# Patient Record
Sex: Male | Born: 1950
Health system: Southern US, Community
[De-identification: ages and names within clinical notes are randomized; demographics above are authoritative.]

## PROBLEM LIST (undated history)

## (undated) DIAGNOSIS — E119 Type 2 diabetes mellitus without complications: Secondary | ICD-10-CM

## (undated) DIAGNOSIS — I10 Essential (primary) hypertension: Secondary | ICD-10-CM

## (undated) DIAGNOSIS — I714 Abdominal aortic aneurysm, without rupture, unspecified: Secondary | ICD-10-CM

## (undated) DIAGNOSIS — I2510 Atherosclerotic heart disease of native coronary artery without angina pectoris: Secondary | ICD-10-CM

## (undated) DIAGNOSIS — I719 Aortic aneurysm of unspecified site, without rupture: Secondary | ICD-10-CM

## (undated) DIAGNOSIS — F419 Anxiety disorder, unspecified: Secondary | ICD-10-CM

## (undated) DIAGNOSIS — C61 Malignant neoplasm of prostate: Secondary | ICD-10-CM

## (undated) DIAGNOSIS — C3490 Malignant neoplasm of unspecified part of unspecified bronchus or lung: Secondary | ICD-10-CM

## (undated) DIAGNOSIS — J449 Chronic obstructive pulmonary disease, unspecified: Secondary | ICD-10-CM

## (undated) DIAGNOSIS — I639 Cerebral infarction, unspecified: Secondary | ICD-10-CM

## (undated) HISTORY — DX: Essential (primary) hypertension: I10

## (undated) HISTORY — DX: Chronic obstructive pulmonary disease, unspecified: J44.9

## (undated) HISTORY — DX: Abdominal aortic aneurysm, without rupture: I71.4

## (undated) HISTORY — DX: Abdominal aortic aneurysm, without rupture, unspecified: I71.40

## (undated) HISTORY — DX: Atherosclerotic heart disease of native coronary artery without angina pectoris: I25.10

## (undated) HISTORY — PX: SINUS SURGERY WITH INSTATRAK: SHX5215

## (undated) HISTORY — DX: Malignant neoplasm of unspecified part of unspecified bronchus or lung: C34.90

## (undated) HISTORY — DX: Type 2 diabetes mellitus without complications: E11.9

## (undated) HISTORY — PX: FRACTURE SURGERY: SHX138

## (undated) HISTORY — PX: PROSTATE BIOPSY: SHX241

---

## 2006-10-12 ENCOUNTER — Ambulatory Visit (HOSPITAL_COMMUNITY): Admission: RE | Admit: 2006-10-12 | Discharge: 2006-10-12 | Payer: Self-pay | Admitting: Family Medicine

## 2008-03-06 ENCOUNTER — Ambulatory Visit (HOSPITAL_COMMUNITY): Admission: RE | Admit: 2008-03-06 | Discharge: 2008-03-06 | Payer: Self-pay | Admitting: Family Medicine

## 2008-03-19 ENCOUNTER — Ambulatory Visit (HOSPITAL_COMMUNITY): Admission: RE | Admit: 2008-03-19 | Discharge: 2008-03-19 | Payer: Self-pay | Admitting: Family Medicine

## 2008-04-08 ENCOUNTER — Ambulatory Visit (HOSPITAL_COMMUNITY): Admission: RE | Admit: 2008-04-08 | Discharge: 2008-04-08 | Payer: Self-pay | Admitting: Family Medicine

## 2008-05-14 ENCOUNTER — Ambulatory Visit (HOSPITAL_COMMUNITY): Admission: RE | Admit: 2008-05-14 | Discharge: 2008-05-14 | Payer: Self-pay | Admitting: Urology

## 2009-09-29 ENCOUNTER — Encounter (INDEPENDENT_AMBULATORY_CARE_PROVIDER_SITE_OTHER): Payer: Self-pay | Admitting: *Deleted

## 2011-05-02 NOTE — Procedures (Signed)
NAME:  Stephen Palmer, Stephen Palmer NO.:  [id number]   MEDICAL RECORD NO.:  [id number]          PATIENT TYPE:  OUT   LOCATION:  RESP                          FACILITY:  [facility]   PHYSICIAN:  Edward L. Juanetta Gosling, M.D.DATE OF BIRTH:  1951-01-25   DATE OF PROCEDURE:  03/19/2008  DATE OF DISCHARGE:                            PULMONARY FUNCTION TEST   1. Spirometry shows no ventilatory defect but evidence of airflow      obstruction.  2. Lung volumes are normal.  3. DLCO is normal.  4. Arterial blood gases are normal.  5. There is no significant bronchodilator response.  6. This is consistent with a clinical diagnosis of COPD which is mild.      Edward L. Juanetta Gosling, M.D.  Electronically Signed     [facility]/MEDQ  D:  03/20/2008  T:  03/20/2008  Job:  [id number]   cc:   Kirk Ruths, M.D.  Fax: [phone]

## 2011-09-12 LAB — BLOOD GAS, ARTERIAL
FIO2: 0.21
O2 Saturation: 97.1
Patient temperature: 37
pH, Arterial: 7.41

## 2012-05-13 ENCOUNTER — Emergency Department (HOSPITAL_COMMUNITY): Payer: Self-pay

## 2012-05-13 ENCOUNTER — Emergency Department (HOSPITAL_COMMUNITY)
Admission: EM | Admit: 2012-05-13 | Discharge: 2012-05-13 | Disposition: A | Discharge status: Home / Self Care | Payer: Self-pay | Attending: Emergency Medicine | Admitting: Emergency Medicine

## 2012-05-13 ENCOUNTER — Encounter (HOSPITAL_COMMUNITY): Payer: Self-pay | Admitting: *Deleted

## 2012-05-13 DIAGNOSIS — R0602 Shortness of breath: Secondary | ICD-10-CM | POA: Insufficient documentation

## 2012-05-13 DIAGNOSIS — R509 Fever, unspecified: Secondary | ICD-10-CM | POA: Insufficient documentation

## 2012-05-13 DIAGNOSIS — R079 Chest pain, unspecified: Secondary | ICD-10-CM | POA: Insufficient documentation

## 2012-05-13 DIAGNOSIS — R059 Cough, unspecified: Secondary | ICD-10-CM | POA: Insufficient documentation

## 2012-05-13 DIAGNOSIS — R112 Nausea with vomiting, unspecified: Secondary | ICD-10-CM | POA: Insufficient documentation

## 2012-05-13 DIAGNOSIS — R05 Cough: Secondary | ICD-10-CM | POA: Insufficient documentation

## 2012-05-13 DIAGNOSIS — F17200 Nicotine dependence, unspecified, uncomplicated: Secondary | ICD-10-CM | POA: Insufficient documentation

## 2012-05-13 DIAGNOSIS — J189 Pneumonia, unspecified organism: Secondary | ICD-10-CM | POA: Insufficient documentation

## 2012-05-13 HISTORY — DX: Anxiety disorder, unspecified: F41.9

## 2012-05-13 LAB — BASIC METABOLIC PANEL
BUN: 24 mg/dL — ABNORMAL HIGH (ref 6–23)
Calcium: 9.7 mg/dL (ref 8.4–10.5)
GFR calc Af Amer: >90 mL/min (ref >90)
GFR calc non Af Amer: 78 mL/min — ABNORMAL LOW (ref >90)
Glucose, Bld: 165 mg/dL — ABNORMAL HIGH (ref 70–99)
Potassium: 3.2 mEq/L — ABNORMAL LOW (ref 3.5–5.1)
Sodium: 131 mEq/L — ABNORMAL LOW (ref 135–145)

## 2012-05-13 LAB — DIFFERENTIAL
Basophils Absolute: 0 10*3/uL (ref 0.0–0.1)
Basophils Relative: 0 % (ref 0–1)
Eosinophils Absolute: 0 10*3/uL (ref 0.0–0.7)
Lymphocytes Relative: 5 % — ABNORMAL LOW (ref 12–46)
Monocytes Absolute: 2.1 10*3/uL — ABNORMAL HIGH (ref 0.1–1.0)
Neutrophils Relative %: 87 % — ABNORMAL HIGH (ref 43–77)

## 2012-05-13 LAB — CBC
Hemoglobin: 12.6 g/dL — ABNORMAL LOW (ref 13.0–17.0)
MCH: 30.6 pg (ref 26.0–34.0)
MCHC: 34.1 g/dL (ref 30.0–36.0)
RDW: 13.6 % (ref 11.5–15.5)

## 2012-05-13 MED ORDER — AZITHROMYCIN 250 MG PO TABS: 250.0000 mg | ORAL_TABLET | Freq: Every day | ORAL | Status: AC

## 2012-05-13 MED ORDER — DEXTROSE 5 % IV SOLN
500.0000 mg | Freq: Once | INTRAVENOUS | Status: AC
  Administered 2012-05-13: 500 mg via INTRAVENOUS
  Filled 2012-05-13: qty 500

## 2012-05-13 MED ORDER — SODIUM CHLORIDE 0.9 % IV BOLUS (SEPSIS)
1000.0000 mL | Freq: Once | INTRAVENOUS | Status: AC
  Administered 2012-05-13: 1000 mL via INTRAVENOUS

## 2012-05-13 MED ORDER — CEFTRIAXONE SODIUM 1 G IJ SOLR
1.0000 g | Freq: Once | INTRAMUSCULAR | Status: AC
  Administered 2012-05-13: 1 g via INTRAVENOUS
  Filled 2012-05-13: qty 10

## 2012-05-13 MED ORDER — IBUPROFEN 800 MG PO TABS
ORAL_TABLET | ORAL | Status: AC
  Administered 2012-05-13: 800 mg
  Filled 2012-05-13: qty 1

## 2012-05-13 NOTE — ED Notes (Signed)
Family at bedside. 

## 2012-05-13 NOTE — ED Provider Notes (Signed)
History   This chart was scribed for No att. providers found by Toya Smothers. The patient was seen in room APA12/APA12. Patient's care was started at 1823.  CSN: [id number]  Arrival date & time 05/13/12  4098   First MD Initiated Contact with Patient 05/13/12 1944      Chief Complaint  Patient presents with  . Fever   The history is provided by the patient. No language interpreter was used.    Stephen Palmer is a 61 y.o. male who presents to the Emergency Department complaining of gradual onset moderate severe constant fever onset one and a half weeks ago with associated bloody sputum initially, cough w/ green and brown phlegm, emesis, severe chills, mild SOB, left side chest pain, and nausea, denying sneezing, HA, and diarrhea.Pt reports that he recently recorded fevers at 102 and 101, and that he is currently experiencing symptoms similar to that of previous episode of pneumonia many years ago. Patient states he had chills today and he sometimes feels short of breath.   PCP  Dr. Regino Schultze  Past Medical History  Diagnosis Date  . Anxiety   degenerative joint disease   Past Surgical History  Procedure Date  . Fracture surgery    History reviewed. No pertinent family history.  History  Substance Use Topics  . Smoking status: Current Everyday Smoker  . Smokeless tobacco: Not on file  . Alcohol Use: No  employed Lives at home with spouse  Review of Systems  Constitutional: Positive for fever and chills.  HENT: Negative for rhinorrhea and neck pain.   Eyes: Negative for pain.  Respiratory: Positive for cough and shortness of breath.   Cardiovascular: Positive for chest pain.  Gastrointestinal: Positive for nausea and vomiting (mild). Negative for abdominal pain and diarrhea.  Genitourinary: Negative for dysuria.  Musculoskeletal: Negative for back pain.  Skin: Negative for rash.  Neurological: Negative for dizziness and weakness.  All other systems reviewed and are  negative.    Allergies  Sulfa antibiotics  Home Medications   Current Outpatient Rx  Name Route Sig Dispense Refill  . ALPRAZOLAM 1 MG PO TABS Oral Take 0.5 mg by mouth 2 (two) times daily.    . CYCLOBENZAPRINE HCL 10 MG PO TABS Oral Take 10 mg by mouth 3 (three) times daily as needed.    Marland Kitchen HYDROCODONE-ACETAMINOPHEN 7.5-650 MG PO TABS Oral Take 0.5-1 tablets by mouth every 6 (six) hours as needed. For pain     BP 140/77  Pulse 129  Temp(Src) 9103F (37.2 C) (Oral)  Resp 18  Ht 5\' 10"  (1.778 m)  Wt 162 lb (73.483 kg)  BMI 23.24 kg/m2  SpO2 93%  Vital signs normal except fever with tachycardia  Physical Exam  Nursing note and vitals reviewed. Constitutional: He is oriented to person, place, and time. He appears well-developed and well-nourished.  Non-toxic appearance. He does not appear ill. No distress.  HENT:  Head: Normocephalic and atraumatic.  Right Ear: External ear normal.  Left Ear: External ear normal.  Nose: Nose normal. No mucosal edema or rhinorrhea.  Mouth/Throat: Mucous membranes are normal. No dental abscesses or uvula swelling.       Dry tongue.  Eyes: Conjunctivae and EOM are normal. Pupils are equal, round, and reactive to light.  Neck: Normal range of motion and full passive range of motion without pain. Neck supple. No tracheal deviation present.  Cardiovascular: Normal rate, regular rhythm and normal heart sounds.  Exam reveals no gallop and no friction  rub.   No murmur heard. Pulmonary/Chest: Effort normal. No respiratory distress. He has no wheezes. He has no rhonchi. Rales: L base. He exhibits no tenderness and no crepitus.  Abdominal: Soft. Normal appearance and bowel sounds are normal. He exhibits no distension. There is no tenderness. There is no rebound and no guarding.  Musculoskeletal: Normal range of motion. He exhibits no edema and no tenderness.       Moves all extremities well.   Neurological: He is alert and oriented to person, place, and  time. He has normal strength. No cranial nerve deficit or sensory deficit.  Skin: Skin is warm, dry and intact. No rash noted. No erythema. No pallor.  Psychiatric: He has a normal mood and affect. His speech is normal and behavior is normal. His mood appears not anxious.    ED Course  Procedures (including critical care time)   Medications  cefTRIAXone (ROCEPHIN) 1 g in dextrose 5 % 50 mL IVPB (1 g Intravenous Given 05/13/12 2035)  azithromycin (ZITHROMAX) 500 mg in dextrose 5 % 250 mL IVPB (0 mg Intravenous Stopped 05/13/12 2236)  sodium chloride 0.9 % bolus 1,000 mL (1000 mL Intravenous Given 05/13/12 2034)     DIAGNOSTIC STUDIES: Oxygen Saturation is 95% on room air, adequate by my interpretation.    COORDINATION OF CARE: 8:23PM- Evaluated state of Pt's present illness and prescribed antibiotics 9:23PM-Checked on Pt. Pt is feeling better. Discussed discharge.patient wants to be discharged, he does not want to be admitted to the hospital. Patient had pulse ox done while ambulating and his lowest pulse ox was 92% on room air.  10:33PM- Rechecked Pt. Pt is feeling better and prepared for discharge. Discussed discharge procedure and reviewed x-ray.patient again states he wants to be discharged instead of admitted.    Dg Chest 2 View  05/13/2012  *RADIOLOGY REPORT*  Clinical Data: Fever for 1 week.  CHEST - 2 VIEW  Comparison: 03/06/2008  Findings: Left lower lobe pneumonia is present with dense retrocardiac consolidation.  No effusion is present.  There is no pneumothorax.  Right lung is clear.  Moderate hyperinflation consistent with COPD. Normal mediastinal contours and pulmonary vascularity.  Cardiac size upper limits normal.  IMPRESSION: Left lower lobe pneumonia.  Original Report Authenticated By: Elsie Stain, M.D.   Results for orders placed during the hospital encounter of 05/13/12  CBC      Component Value Range   WBC 25.7 (*) 4.0 - 10.5 (K/uL)   RBC 4.12 (*) 4.22 - 5.81  (MIL/uL)   Hemoglobin 12.6 (*) 13.0 - 17.0 (g/dL)   HCT 40.9 (*) 81.1 - 52.0 (%)   MCV 89.8  78.0 - 100.0 (fL)   MCH 30.6  26.0 - 34.0 (pg)   MCHC 34.1  30.0 - 36.0 (g/dL)   RDW 91.4  78.2 - 95.6 (%)   Platelets 245  150 - 400 (K/uL)  DIFFERENTIAL      Component Value Range   Neutrophils Relative 87 (*) 43 - 77 (%)   Lymphocytes Relative 5 (*) 12 - 46 (%)   Monocytes Relative 8  3 - 12 (%)   Eosinophils Relative 0  0 - 5 (%)   Basophils Relative 0  0 - 1 (%)   Neutro Abs 22.3 (*) 1.7 - 7.7 (K/uL)   Lymphs Abs 1.3  0.7 - 4.0 (K/uL)   Monocytes Absolute 2.1 (*) 0.1 - 1.0 (K/uL)   Eosinophils Absolute 0.0  0.0 - 0.7 (K/uL)   Basophils Absolute  0.0  0.0 - 0.1 (K/uL)   WBC Morphology WHITE COUNT CONFIRMED ON SMEAR    BASIC METABOLIC PANEL      Component Value Range   Sodium 131 (*) 135 - 145 (mEq/L)   Potassium 3.2 (*) 3.5 - 5.1 (mEq/L)   Chloride 95 (*) 96 - 112 (mEq/L)   CO2 25  19 - 32 (mEq/L)   Glucose, Bld 165 (*) 70 - 99 (mg/dL)   BUN 24 (*) 6 - 23 (mg/dL)   Creatinine, Ser 2.53  0.50 - 1.35 (mg/dL)   Calcium 9.7  8.4 - 66.4 (mg/dL)   GFR calc non Af Amer 78 (*) >90 (mL/min)   GFR calc Af Amer >90  >90 (mL/min)   Laboratory interpretation all normal except I'll hyponatremia and hypokalemia, mild hyper glycemia,, leukocytosis    1. Pneumonia, community acquired    New Prescriptions   AZITHROMYCIN (ZITHROMAX) 250 MG TABLET    Take 1 tablet (250 mg total) by mouth daily.   Plan discharge  Devoria Albe, MD, FACEP   MDM   I personally performed the services described in this documentation, which was scribed in my presence. The recorded information has been reviewed and considered.  Devoria Albe, MD, FACEP        Ward Givens, MD 05/14/12 0040

## 2012-05-13 NOTE — ED Notes (Signed)
Patient is resting comfortably. 

## 2012-05-13 NOTE — ED Notes (Signed)
Ambulated pt around nurses station; lowest O2 sat was at 92%.

## 2012-05-13 NOTE — ED Notes (Signed)
Fever, body aches , nausea.  No rash

## 2012-05-13 NOTE — Discharge Instructions (Signed)
Drink plenty of fluids. Take ibuprofen 600 mg + acetaminophen 1000 mg every 6 hrs for fever. Use mucinex OTC for your cough. Take the zithromax until gone. Have Dr Regino Schultze recheck you in about 2-3 days. Return to the ED if you struggle to breathe or feel worse.   Pneumonia, Adult Pneumonia is an infection of the lungs. It may be caused by a germ (virus or bacteria). Some types of pneumonia can spread easily from person to person. This can happen when you cough or sneeze. HOME CARE  Only take medicine as told by your doctor.   Take your medicine (antibiotics) as told. Finish it even if you start to feel better.   Do not smoke.   You may use a vaporizer or humidifier in your room. This can help loosen thick spit (mucus).   Sleep so you are almost sitting up (semi-upright). This helps reduce coughing.   Rest.  A shot (vaccine) can help prevent pneumonia. Shots are often advised for:  People over 66 years old.   Patients on chemotherapy.   People with long-term (chronic) lung problems.   People with immune system problems.  GET HELP RIGHT AWAY IF:   You are getting worse.   You cannot control your cough, and you are losing sleep.   You cough up blood.   Your pain gets worse, even with medicine.   You have a fever.   Any of your problems are getting worse, not better.   You have shortness of breath or chest pain.  MAKE SURE YOU:   Understand these instructions.   Will watch your condition.   Will get help right away if you are not doing well or get worse.  Document Released: 05/22/2008 Document Revised: 11/23/2011 Document Reviewed: 02/24/2011 [facility] Patient Information 2012 [place], [place].

## 2015-06-29 ENCOUNTER — Ambulatory Visit (HOSPITAL_COMMUNITY)
Admission: RE | Admit: 2015-06-29 | Discharge: 2015-06-29 | Disposition: A | Discharge status: Home / Self Care | Payer: Self-pay | Source: Ambulatory Visit | Attending: Physician Assistant | Admitting: Physician Assistant

## 2015-06-29 ENCOUNTER — Other Ambulatory Visit (HOSPITAL_COMMUNITY): Payer: Self-pay | Admitting: Physician Assistant

## 2015-06-29 DIAGNOSIS — R05 Cough: Secondary | ICD-10-CM

## 2015-06-29 DIAGNOSIS — R0602 Shortness of breath: Secondary | ICD-10-CM

## 2015-06-29 DIAGNOSIS — R059 Cough, unspecified: Secondary | ICD-10-CM

## 2015-06-29 DIAGNOSIS — R079 Chest pain, unspecified: Secondary | ICD-10-CM | POA: Insufficient documentation

## 2015-06-29 DIAGNOSIS — F17200 Nicotine dependence, unspecified, uncomplicated: Secondary | ICD-10-CM

## 2015-09-08 ENCOUNTER — Ambulatory Visit (HOSPITAL_COMMUNITY)
Admission: RE | Admit: 2015-09-08 | Discharge: 2015-09-08 | Disposition: A | Discharge status: Home / Self Care | Payer: Medicare Other | Source: Ambulatory Visit | Attending: Family Medicine | Admitting: Family Medicine

## 2015-09-08 ENCOUNTER — Other Ambulatory Visit (HOSPITAL_COMMUNITY): Payer: Self-pay | Admitting: Family Medicine

## 2015-09-08 DIAGNOSIS — R0602 Shortness of breath: Secondary | ICD-10-CM | POA: Diagnosis not present

## 2015-09-08 DIAGNOSIS — R079 Chest pain, unspecified: Secondary | ICD-10-CM

## 2015-09-08 DIAGNOSIS — J439 Emphysema, unspecified: Secondary | ICD-10-CM | POA: Diagnosis not present

## 2015-09-08 DIAGNOSIS — R059 Cough, unspecified: Secondary | ICD-10-CM

## 2015-09-08 DIAGNOSIS — R05 Cough: Secondary | ICD-10-CM | POA: Diagnosis not present

## 2015-09-08 DIAGNOSIS — Z0001 Encounter for general adult medical examination with abnormal findings: Secondary | ICD-10-CM | POA: Diagnosis not present

## 2015-09-08 DIAGNOSIS — F17290 Nicotine dependence, other tobacco product, uncomplicated: Secondary | ICD-10-CM | POA: Diagnosis not present

## 2015-09-08 DIAGNOSIS — Z125 Encounter for screening for malignant neoplasm of prostate: Secondary | ICD-10-CM | POA: Diagnosis not present

## 2015-09-08 DIAGNOSIS — F22 Delusional disorders: Secondary | ICD-10-CM | POA: Diagnosis not present

## 2015-09-08 DIAGNOSIS — Z6825 Body mass index (BMI) 25.0-25.9, adult: Secondary | ICD-10-CM | POA: Diagnosis not present

## 2015-09-08 DIAGNOSIS — E663 Overweight: Secondary | ICD-10-CM | POA: Diagnosis not present

## 2015-09-08 DIAGNOSIS — Z1389 Encounter for screening for other disorder: Secondary | ICD-10-CM | POA: Diagnosis not present

## 2015-09-08 DIAGNOSIS — I10 Essential (primary) hypertension: Secondary | ICD-10-CM | POA: Diagnosis not present

## 2015-09-08 DIAGNOSIS — J449 Chronic obstructive pulmonary disease, unspecified: Secondary | ICD-10-CM | POA: Diagnosis not present

## 2015-09-08 DIAGNOSIS — E1165 Type 2 diabetes mellitus with hyperglycemia: Secondary | ICD-10-CM | POA: Diagnosis not present

## 2015-09-08 DIAGNOSIS — E782 Mixed hyperlipidemia: Secondary | ICD-10-CM | POA: Diagnosis not present

## 2015-09-09 ENCOUNTER — Other Ambulatory Visit (HOSPITAL_COMMUNITY): Payer: Self-pay | Admitting: Family Medicine

## 2015-09-09 DIAGNOSIS — Z1389 Encounter for screening for other disorder: Secondary | ICD-10-CM

## 2015-09-09 DIAGNOSIS — Z136 Encounter for screening for cardiovascular disorders: Secondary | ICD-10-CM

## 2015-09-09 DIAGNOSIS — R059 Cough, unspecified: Secondary | ICD-10-CM

## 2015-09-09 DIAGNOSIS — R05 Cough: Secondary | ICD-10-CM

## 2015-09-09 DIAGNOSIS — R079 Chest pain, unspecified: Secondary | ICD-10-CM

## 2015-09-09 DIAGNOSIS — Z139 Encounter for screening, unspecified: Secondary | ICD-10-CM

## 2015-09-09 DIAGNOSIS — Z87891 Personal history of nicotine dependence: Secondary | ICD-10-CM

## 2015-09-10 DIAGNOSIS — R7982 Elevated C-reactive protein (CRP): Secondary | ICD-10-CM | POA: Diagnosis not present

## 2015-09-15 ENCOUNTER — Ambulatory Visit (HOSPITAL_COMMUNITY)
Admission: RE | Admit: 2015-09-15 | Discharge: 2015-09-15 | Disposition: A | Discharge status: Home / Self Care | Payer: Medicare Other | Source: Ambulatory Visit | Attending: Family Medicine | Admitting: Family Medicine

## 2015-09-15 DIAGNOSIS — E119 Type 2 diabetes mellitus without complications: Secondary | ICD-10-CM | POA: Diagnosis not present

## 2015-09-15 DIAGNOSIS — I10 Essential (primary) hypertension: Secondary | ICD-10-CM | POA: Insufficient documentation

## 2015-09-15 DIAGNOSIS — Z136 Encounter for screening for cardiovascular disorders: Secondary | ICD-10-CM

## 2015-09-15 DIAGNOSIS — Z87891 Personal history of nicotine dependence: Secondary | ICD-10-CM

## 2015-09-15 DIAGNOSIS — Z720 Tobacco use: Secondary | ICD-10-CM | POA: Insufficient documentation

## 2015-09-15 DIAGNOSIS — I714 Abdominal aortic aneurysm, without rupture: Secondary | ICD-10-CM | POA: Insufficient documentation

## 2015-09-15 DIAGNOSIS — E785 Hyperlipidemia, unspecified: Secondary | ICD-10-CM | POA: Diagnosis not present

## 2015-09-29 ENCOUNTER — Encounter: Payer: Self-pay | Admitting: Vascular Surgery

## 2015-10-01 ENCOUNTER — Ambulatory Visit (INDEPENDENT_AMBULATORY_CARE_PROVIDER_SITE_OTHER): Payer: Medicare Other | Admitting: Vascular Surgery

## 2015-10-01 ENCOUNTER — Encounter: Payer: Self-pay | Admitting: Vascular Surgery

## 2015-10-01 VITALS — BP 140/88 | HR 77 | Temp 97.3°F | Ht 70.0 in | Wt 171.9 lb

## 2015-10-01 DIAGNOSIS — F17210 Nicotine dependence, cigarettes, uncomplicated: Secondary | ICD-10-CM | POA: Diagnosis not present

## 2015-10-01 DIAGNOSIS — I70212 Atherosclerosis of native arteries of extremities with intermittent claudication, left leg: Secondary | ICD-10-CM

## 2015-10-01 DIAGNOSIS — I714 Abdominal aortic aneurysm, without rupture, unspecified: Secondary | ICD-10-CM

## 2015-10-01 DIAGNOSIS — Z716 Tobacco abuse counseling: Secondary | ICD-10-CM

## 2015-10-01 NOTE — Addendum Note (Signed)
Addended by: Mena Goes on: 10/01/2015 03:20 PM   Modules accepted: Orders

## 2015-10-01 NOTE — Progress Notes (Signed)
[facility] [place]  Patient name: Stephen Palmer MRN: [id number] DOB: 11-02-1951 Sex: Stephen  REASON FOR CONSULT: abdominal aortic aneurysm. Referred by Dr. Gerarda Fraction.  HPI: Stephen Palmer is a 64 y.o. Stephen, who is who was referred for evaluation of an abdominal aortic aneurysm. He was having some vague abdominal pain. He had a ultrasound of the aorta at [facility] on 09/15/2015 which showed a 4.4 cm abdominal aortic aneurysm. Currently he is not having significant abdominal pain. He does have some chronic low back pain. He is unaware of any family history of abdominal aortic aneurysms although he studies state that his sister had 3 brain aneurysms.  He also admits to left calf claudication which is brought on by ambulation and relieved with rest. He denies any history of rest pain or nonhealing ulcers.    I have reviewed the records that were sent with him. It appears that his hypertension has been under good control.  Past Medical History  Diagnosis Date  . Anxiety   . Diabetes mellitus without complication ([place])   . COPD (chronic obstructive pulmonary disease) ([facility])     Family History  Problem Relation Age of Onset  . Heart disease Father     before age 5   There is no family history of  Abdominal aortic aneurysms. There is no family history of premature cardiovascular disease.  SOCIAL HISTORY: Social History   Social History  . Marital Status: Married    Spouse Name: N/A  . Number of Children: N/A  . Years of Education: N/A   Occupational History  . Not on file.   Social History Main Topics  . Smoking status: Current Every Day Smoker -- 2.00 packs/day    Types: Cigarettes  . Smokeless tobacco: Not on file  . Alcohol Use: No  . Drug Use: No  . Sexual Activity: Not on file   Other Topics Concern  . Not on file   Social History Narrative    Allergies  Allergen Reactions  . Sulfa Antibiotics Hives, Itching and Swelling    Tongue  swelling    Current Outpatient Prescriptions  Medication Sig Dispense Refill  . ALPRAZolam (XANAX) 1 MG tablet Take 0.5 mg by mouth 2 (two) times daily.    . citalopram (CELEXA) 40 MG tablet Take 40 mg by mouth daily.     . cyclobenzaprine (FLEXERIL) 10 MG tablet Take 10 mg by mouth 3 (three) times daily as needed.    Marland Kitchen glipiZIDE (GLUCOTROL) 5 MG tablet Take 5 mg by mouth daily before breakfast.    . losartan (COZAAR) 100 MG tablet Take 100 mg by mouth daily.     . simvastatin (ZOCOR) 20 MG tablet     . traMADol (ULTRAM) 50 MG tablet Take 50 mg by mouth every 6 (six) hours as needed.    . hydrocodone-acetaminophen (LORCET PLUS) 7.5-650 MG per tablet Take 0.5-1 tablets by mouth every 6 (six) hours as needed. For pain     No current facility-administered medications for this visit.    REVIEW OF SYSTEMS:  '[X]'$  denotes positive finding, '[ ]'$  denotes negative finding Cardiac  Comments:  Chest pain or chest pressure: X This sounds like musculoskeletal pain. It occurred after he had been doing pushups.  Shortness of breath upon exertion: X   Short of breath when lying flat:    Irregular heart rhythm:        Vascular    Pain in calf, thigh,  or hip brought on by ambulation:    Pain in feet at night that wakes you up from your sleep:     Blood clot in your veins:    Leg swelling:         Pulmonary    Oxygen at home:    Productive cough:     Wheezing:  X       Neurologic    Sudden weakness in arms or legs:     Sudden numbness in arms or legs:     Sudden onset of difficulty speaking or slurred speech:    Temporary loss of vision in one eye:     Problems with dizziness:         Gastrointestinal    Blood in stool:     Vomited blood:         Genitourinary    Burning when urinating:     Blood in urine:        Psychiatric    Major depression:         Hematologic    Bleeding problems:    Problems with blood clotting too easily:        Skin    Rashes or ulcers:          Constitutional    Fever or chills:      PHYSICAL EXAM: Filed Vitals:   10/01/15 0904  BP: 140/88  Pulse: 77  Temp: 97.3 F (36.3 C)  TempSrc: Oral  Height: '5\' 10"'$  (1.778 m)  Weight: 171 lb 14.4 oz (77.973 kg)  SpO2: 96%    GENERAL: The patient is a well-nourished Stephen, in no acute distress. The vital signs are documented above. CARDIAC: There is a regular rate and rhythm.  VASCULAR: I do not detect carotid bruits. On the right side, he has a palpable femoral, popliteal, and posterior tibial pulse. On the left side, he has a palpable femoral pulse. I cannot palpate a popliteal, dorsalis pedis, or posterior tibial pulse. He has no significant lower extremity swelling. PULMONARY: There is good air exchange bilaterally without wheezing or rales. ABDOMEN: Soft and non-tender with normal pitched bowel sounds. His aneurysm is nontender. MUSCULOSKELETAL: There are no major deformities or cyanosis. NEUROLOGIC: No focal weakness or paresthesias are detected. SKIN: There are no ulcers or rashes noted. PSYCHIATRIC: The patient has a normal affect.  DATA:  DUPLEX OF ABDOMINAL AORTIC ANEURYSM: I have reviewed the ultrasound that was done on 09/15/2015. This shows that he has a 4.4 cm aneurysm of the abdominal aorta in its midsegment. The common iliac arteries measure 1.7 cm in maximum diameter bilaterally.  MEDICAL ISSUES:  ABDOMINAL AORTIC ANEURYSM: The patient has in a symptomatically for 0.4 cm infrarenal abdominal aortic aneurysm. I've explained that we would not consider elective repair unless it reached 5.5 cm in maximum diameter or was enlarging rapidly. Given that he has a history of spondylosis I have recommended a follow up duplex scan in 6 months instead of waiting one year and I will see him back at that time. I've also had a long discussion with him, greater than 3 minutes, about the importance of tobacco cessation as this does increase his risk of continued aneurysm expansion. I  also explained that its important keep his blood pressure well controlled and currently it appears to be well controlled.  LEFT CALF CLAUDICATION: The patient has evidence of infrainguinal arterial occlusive disease on the left. I've again discussed with him the importance of tobacco cessation. I've encouraged  him to get on a structured walking program. I've ordered ABIs were seen him back in 6 months to check on his aneurysm. He knows to call sooner if he has problems.   Deitra Mayo [facility] [place]: [phone]

## 2015-11-09 DIAGNOSIS — Z1211 Encounter for screening for malignant neoplasm of colon: Secondary | ICD-10-CM | POA: Diagnosis not present

## 2015-11-10 NOTE — H&P (Signed)
  NTS SOAP Note  Vital Signs:  Vitals as of: 61/95/0932: Systolic 671: Diastolic 89: Heart Rate 94: Temp 97.45F: Height 76f 10in: Weight 178Lbs 0 Ounces: BMI 25.54  BMI : 25.54 kg/m2  Subjective: This 64year old male presents for of need for screening TCS.  Has not had a colonoscopy in over ten years.  Has had intermittent bright red blood per rectum in the past.  No family h/o colon carcinoma.   Review of Symptoms:  Constitutional:fatigue Head:unremarkable Eyes:blurred vision bilateral sinus problems Cardiovascular:  chest pain Respiratory:unremarkable Gastrointestinabdominal pain, heartburn, dyspepsia Genitourinary:frequency joint, back, and neck pain Skin:unremarkable Hematolgic/Lymphatic:unremarkable   Allergic/Immunologic:unremarkable   Past Medical History:  Reviewed  Past Medical History  Surgical History: sinus surgery Medical Problems: NIDDM, aortic aneurysm, HTN, high cholesterol Allergies: sulfa Medications: losartan, cyclobenzaprine, simvastatin, citalopram, alprazolam, glipizide, tramadol   Social History:Reviewed  Social History  Preferred Language: English Race:  White Ethnicity: Not Hispanic / Latino Age: [age]year Marital Status:  M Alcohol: no   Smoking Status: Current every day smoker reviewed on 11/09/2015 Started Date:  Packs per day: 2.00 Functional Status reviewed on 11/09/2015 ------------------------------------------------ Bathing: Normal Cooking: Normal Dressing: Normal Driving: Normal Eating: Normal Managing Meds: Normal Oral Care: Normal Shopping: Normal Toileting: Normal Transferring: Normal Walking: Normal Cognitive Status reviewed on 11/09/2015 ------------------------------------------------ Attention: Normal Decision Making: Normal Language: Normal Memory: Normal Motor: Normal Perception: Normal Problem Solving: Normal Visual and Spatial: Normal   Family History:Reviewed  Family Health  History Mother, Living; Heart disease; Diabetes mellitus, unspecified type;  Father, Living; Diabetes mellitus, unspecified type; Heart disease;     Objective Information: General:Well appearing, well nourished in no distress. Heart:RRR, no murmur or gallop.  Normal S1, S2.  No S3, S4.  Lungs:  CTA bilaterally, no wheezes, rhonchi, rales.  Breathing unlabored. Abdomen:Soft, NT/ND, no HSM, no masses. deferred to procedure  Assessment:Need for screening TCS  Diagnoses: V76.51  Z12.11 Screening for malignant neoplasm of colon (Encounter for screening for malignant neoplasm of colon)  Procedures: [id number]- OFFICE OUTPATIENT NEW 20 MINUTES    Plan:  Scheduled for screening TCS on 11/23/15.   Patient Education:Alternative treatments to surgery were discussed with patient (and family).  Risks and benefits  of procedure including bleeding and perforation were fully explained to the patient (and family) who gave informed consent. Patient/family questions were addressed.  Follow-up:Pending Surgery

## 2015-11-22 ENCOUNTER — Encounter (HOSPITAL_COMMUNITY): Payer: Self-pay | Admitting: *Deleted

## 2015-11-22 NOTE — Progress Notes (Signed)
Notified Dr. Arnoldo Morale about patient's aortic aneurysm per patient's request (4.4 present; no intervention per his surgeon until it measures 5.5). Dr. Arnoldo Morale stated to go ahead with the procedure. No further orders at this time.

## 2015-11-23 ENCOUNTER — Encounter (HOSPITAL_COMMUNITY)
Admission: RE | Disposition: A | Discharge status: Home / Self Care | Payer: Self-pay | Source: Ambulatory Visit | Attending: General Surgery

## 2015-11-23 ENCOUNTER — Encounter (HOSPITAL_COMMUNITY): Payer: Self-pay | Admitting: *Deleted

## 2015-11-23 ENCOUNTER — Ambulatory Visit (HOSPITAL_COMMUNITY)
Admission: RE | Admit: 2015-11-23 | Discharge: 2015-11-23 | Disposition: A | Discharge status: Home / Self Care | Payer: Medicare Other | Source: Ambulatory Visit | Attending: General Surgery | Admitting: General Surgery

## 2015-11-23 DIAGNOSIS — I10 Essential (primary) hypertension: Secondary | ICD-10-CM | POA: Diagnosis not present

## 2015-11-23 DIAGNOSIS — Z7984 Long term (current) use of oral hypoglycemic drugs: Secondary | ICD-10-CM | POA: Diagnosis not present

## 2015-11-23 DIAGNOSIS — Z1211 Encounter for screening for malignant neoplasm of colon: Secondary | ICD-10-CM | POA: Diagnosis not present

## 2015-11-23 DIAGNOSIS — F17200 Nicotine dependence, unspecified, uncomplicated: Secondary | ICD-10-CM | POA: Insufficient documentation

## 2015-11-23 DIAGNOSIS — E119 Type 2 diabetes mellitus without complications: Secondary | ICD-10-CM | POA: Diagnosis not present

## 2015-11-23 DIAGNOSIS — D120 Benign neoplasm of cecum: Secondary | ICD-10-CM | POA: Diagnosis not present

## 2015-11-23 DIAGNOSIS — K5730 Diverticulosis of large intestine without perforation or abscess without bleeding: Secondary | ICD-10-CM | POA: Diagnosis not present

## 2015-11-23 DIAGNOSIS — Z79899 Other long term (current) drug therapy: Secondary | ICD-10-CM | POA: Insufficient documentation

## 2015-11-23 DIAGNOSIS — E7800 Pure hypercholesterolemia, unspecified: Secondary | ICD-10-CM | POA: Insufficient documentation

## 2015-11-23 DIAGNOSIS — D124 Benign neoplasm of descending colon: Secondary | ICD-10-CM | POA: Diagnosis not present

## 2015-11-23 DIAGNOSIS — K635 Polyp of colon: Secondary | ICD-10-CM | POA: Diagnosis not present

## 2015-11-23 HISTORY — DX: Aortic aneurysm of unspecified site, without rupture: I71.9

## 2015-11-23 HISTORY — PX: COLONOSCOPY: SHX5424

## 2015-11-23 LAB — GLUCOSE, CAPILLARY: Glucose-Capillary: 182 mg/dL — ABNORMAL HIGH (ref 65–99)

## 2015-11-23 SURGERY — COLONOSCOPY
Anesthesia: Moderate Sedation

## 2015-11-23 MED ORDER — MIDAZOLAM HCL 5 MG/5ML IJ SOLN
INTRAMUSCULAR | Status: AC
  Filled 2015-11-23: qty 5

## 2015-11-23 MED ORDER — SODIUM CHLORIDE 0.9 % IV SOLN
INTRAVENOUS | Status: DC
  Administered 2015-11-23: 08:00:00 via INTRAVENOUS

## 2015-11-23 MED ORDER — MEPERIDINE HCL 50 MG/ML IJ SOLN
INTRAMUSCULAR | Status: DC | PRN
  Administered 2015-11-23: 50 mg via INTRAVENOUS

## 2015-11-23 MED ORDER — MIDAZOLAM HCL 5 MG/5ML IJ SOLN
INTRAMUSCULAR | Status: DC | PRN
  Administered 2015-11-23: 3 mg via INTRAVENOUS
  Administered 2015-11-23: 1 mg via INTRAVENOUS

## 2015-11-23 MED ORDER — MEPERIDINE HCL 50 MG/ML IJ SOLN
INTRAMUSCULAR | Status: AC
  Filled 2015-11-23: qty 1

## 2015-11-23 NOTE — Discharge Instructions (Signed)
Diverticulosis Diverticulosis is the condition that develops when small pouches (diverticula) form in the wall of your colon. Your colon, or large intestine, is where water is absorbed and stool is formed. The pouches form when the inside layer of your colon pushes through weak spots in the outer layers of your colon. CAUSES  No one knows exactly what causes diverticulosis. RISK FACTORS  Being older than 85. Your risk for this condition increases with age. Diverticulosis is rare in people younger than 40 years. By age 13, almost everyone has it.  Eating a low-fiber diet.  Being frequently constipated.  Being overweight.  Not getting enough exercise.  Smoking.  Taking over-the-counter pain medicines, like aspirin and ibuprofen. SYMPTOMS  Most people with diverticulosis do not have symptoms. DIAGNOSIS  Because diverticulosis often has no symptoms, health care providers often discover the condition during an exam for other colon problems. In many cases, a health care provider will diagnose diverticulosis while using a flexible scope to examine the colon (colonoscopy). TREATMENT  If you have never developed an infection related to diverticulosis, you may not need treatment. If you have had an infection before, treatment may include:  Eating more fruits, vegetables, and grains.  Taking a fiber supplement.  Taking a live bacteria supplement (probiotic).  Taking medicine to relax your colon. HOME CARE INSTRUCTIONS   Drink at least 6-8 glasses of water each day to prevent constipation.  Try not to strain when you have a bowel movement.  Keep all follow-up appointments. If you have had an infection before:  Increase the fiber in your diet as directed by your health care provider or dietitian.  Take a dietary fiber supplement if your health care provider approves.  Only take medicines as directed by your health care provider. SEEK MEDICAL CARE IF:   You have abdominal  pain.  You have bloating.  You have cramps.  You have not gone to the bathroom in 3 days. SEEK IMMEDIATE MEDICAL CARE IF:   Your pain gets worse.  Yourbloating becomes very bad.  You have a fever or chills, and your symptoms suddenly get worse.  You begin vomiting.  You have bowel movements that are bloody or black. MAKE SURE YOU:  Understand these instructions.  Will watch your condition.  Will get help right away if you are not doing well or get worse.   This information is not intended to replace advice given to you by your health care provider. Make sure you discuss any questions you have with your health care provider.   Document Released: 08/31/2004 Document Revised: 12/09/2013 Document Reviewed: 10/29/2013 Elsevier Interactive Patient Education 2016 Elsevier Inc. Colon Polyps Polyps are lumps of extra tissue growing inside the body. Polyps can grow in the large intestine (colon). Most colon polyps are noncancerous (benign). However, some colon polyps can become cancerous over time. Polyps that are larger than a pea may be harmful. To be safe, caregivers remove and test all polyps. CAUSES  Polyps form when mutations in the genes cause your cells to grow and divide even though no more tissue is needed. RISK FACTORS There are a number of risk factors that can increase your chances of getting colon polyps. They include:  Being older than 50 years.  Family history of colon polyps or colon cancer.  Long-term colon diseases, such as colitis or Crohn disease.  Being overweight.  Smoking.  Being inactive.  Drinking too much alcohol. SYMPTOMS  Most small polyps do not cause symptoms. If symptoms  are present, they may include:  Blood in the stool. The stool may look dark red or black.  Constipation or diarrhea that lasts longer than 1 week. DIAGNOSIS People often do not know they have polyps until their caregiver finds them during a regular checkup. Your  caregiver can use 4 tests to check for polyps:  Digital rectal exam. The caregiver wears gloves and feels inside the rectum. This test would find polyps only in the rectum.  Barium enema. The caregiver puts a liquid called barium into your rectum before taking X-rays of your colon. Barium makes your colon look white. Polyps are dark, so they are easy to see in the X-ray pictures.  Sigmoidoscopy. A thin, flexible tube (sigmoidoscope) is placed into your rectum. The sigmoidoscope has a light and tiny camera in it. The caregiver uses the sigmoidoscope to look at the last third of your colon.  Colonoscopy. This test is like sigmoidoscopy, but the caregiver looks at the entire colon. This is the most common method for finding and removing polyps. TREATMENT  Any polyps will be removed during a sigmoidoscopy or colonoscopy. The polyps are then tested for cancer. PREVENTION  To help lower your risk of getting more colon polyps:  Eat plenty of fruits and vegetables. Avoid eating fatty foods.  Do not smoke.  Avoid drinking alcohol.  Exercise every day.  Lose weight if recommended by your caregiver.  Eat plenty of calcium and folate. Foods that are rich in calcium include milk, cheese, and broccoli. Foods that are rich in folate include chickpeas, kidney beans, and spinach. HOME CARE INSTRUCTIONS Keep all follow-up appointments as directed by your caregiver. You may need periodic exams to check for polyps. SEEK MEDICAL CARE IF: You notice bleeding during a bowel movement.   This information is not intended to replace advice given to you by your health care provider. Make sure you discuss any questions you have with your health care provider.   Document Released: 08/30/2004 Document Revised: 12/25/2014 Document Reviewed: 02/13/2012 Elsevier Interactive Patient Education 2016 Elsevier Inc. Colonoscopy, Care After Refer to this sheet in the next few weeks. These instructions provide you with  information on caring for yourself after your procedure. Your health care provider may also give you more specific instructions. Your treatment has been planned according to current medical practices, but problems sometimes occur. Call your health care provider if you have any problems or questions after your procedure. WHAT TO EXPECT AFTER THE PROCEDURE  After your procedure, it is typical to have the following:  A small amount of blood in your stool.  Moderate amounts of gas and mild abdominal cramping or bloating. HOME CARE INSTRUCTIONS  Do not drive, operate machinery, or sign important documents for 24 hours.  You may shower and resume your regular physical activities, but move at a slower pace for the first 24 hours.  Take frequent rest periods for the first 24 hours.  Walk around or put a warm pack on your abdomen to help reduce abdominal cramping and bloating.  Drink enough fluids to keep your urine clear or pale yellow.  You may resume your normal diet as instructed by your health care provider. Avoid heavy or fried foods that are hard to digest.  Avoid drinking alcohol for 24 hours or as instructed by your health care provider.  Only take over-the-counter or prescription medicines as directed by your health care provider.  If a tissue sample (biopsy) was taken during your procedure:  Do not take  aspirin or blood thinners for 7 days, or as instructed by your health care provider.  Do not drink alcohol for 7 days, or as instructed by your health care provider.  Eat soft foods for the first 24 hours. SEEK MEDICAL CARE IF: You have persistent spotting of blood in your stool 2-3 days after the procedure. SEEK IMMEDIATE MEDICAL CARE IF:  You have more than a small spotting of blood in your stool.  You pass large blood clots in your stool.  Your abdomen is swollen (distended).  You have nausea or vomiting.  You have a fever.  You have increasing abdominal pain that is  not relieved with medicine.   This information is not intended to replace advice given to you by your health care provider. Make sure you discuss any questions you have with your health care provider.   Document Released: 07/18/2004 Document Revised: 09/24/2013 Document Reviewed: 08/11/2013 Elsevier Interactive Patient Education [employer].

## 2015-11-23 NOTE — Interval H&P Note (Signed)
History and Physical Interval Note:  11/23/2015 8:11 AM  Stephen Palmer  has presented today for surgery, with the diagnosis of screening  The various methods of treatment have been discussed with the patient and family. After consideration of risks, benefits and other options for treatment, the patient has consented to  Procedure(s): COLONOSCOPY (N/A) as a surgical intervention .  The patient's history has been reviewed, patient examined, no change in status, stable for surgery.  I have reviewed the patient's chart and labs.  Questions were answered to the patient's satisfaction.     Aviva Signs A

## 2015-11-23 NOTE — Op Note (Signed)
[facility] [address] [place], [address]   COLONOSCOPY PROCEDURE REPORT     EXAM DATE: 12-08-15  PATIENT NAME:      Stephen Palmer, Stephen Palmer           MR #:      [id number]  BIRTHDATE:       05-08-1951      VISIT #:     [phone]  ATTENDING:     Aviva Signs, MD     STATUS:     outpatient ASSISTANT:  INDICATIONS:  The patient is a 64 yr old male here for a colonoscopy due to average risk patient for colon cancer. PROCEDURE PERFORMED:     Colonoscopy with snare polypectomy MEDICATIONS:     Demerol 50 mg IV and Versed 4 mg IV ESTIMATED BLOOD LOSS:     None  CONSENT: The patient understands the risks and benefits of the procedure and understands that these risks include, but are not limited to: sedation, allergic reaction, infection, perforation and/or bleeding. Alternative means of evaluation and treatment include, among others: physical exam, x-rays, and/or surgical intervention. The patient elects to proceed with this endoscopic procedure.  DESCRIPTION OF PROCEDURE: During intra-op preparation period all mechanical & medical equipment was checked for proper function. Hand hygiene and appropriate measures for infection prevention was taken. After the risks, benefits and alternatives of the procedure were thoroughly explained, Informed consent was verified, confirmed and timeout was successfully executed by the treatment team. A digital exam revealed no abnormalities of the rectum. The EC-[id number] ([id number]) endoscope was introduced through the anus and advanced to the cecum, which was identified by both the appendix and ileocecal valve. adequate (Trilyte was used) The instrument was then slowly withdrawn as the colon was fully examined.Estimated blood loss is zero unless otherwise noted in this procedure report.   COLON FINDINGS: A sessile polyp ranging between 3-40m in size with a friable surface was found at the cecum.  A polypectomy was performed  using snare cautery.  The resection was complete, the polyp tissue was completely retrieved and sent to histology.   A smooth sessile polyp ranging between 3-558min size with a friable surface was found in the proximal descending colon.  A polypectomy was performed using snare cautery.  The resection was complete, the polyp tissue was completely retrieved and sent to histology. Diverticula was found in the sigmoid colon.  The opening was medium sized.   The examination was otherwise normal. Retroflexed views revealed internal hemorrhoids. The scope was then completely withdrawn from the patient and the procedure terminated. SCOPE WITHDRAWAL TIME: 11    ADVERSE EVENTS:      There were no immediate complications.  IMPRESSIONS:     1.  Sessile polyp ranging between 3-44m28mn size was found at the cecum; polypectomy was performed using snare cautery 2.  Sessile polyp ranging between 3-44mm34m size was found in the proximal descending colon; polypectomy was performed using snare cautery 3.  Diverticula in the sigmoid colon 4.  The examination was otherwise normal  RECOMMENDATIONS:     1.  Await pathology results 2.  Repeat Colonoscopy in 3 years. RECALL:  _____________________________ MarkAviva Signs eSigned:  MarkAviva Signs [id number] AM   cc:   CPT CODES: ICD CODES:  The ICD and CPT codes recommended by this software are interpretations from the data that the clinical staff has captured with the software.  The verification of the translation of this report to the  ICD and CPT codes and modifiers is the sole responsibility of the health care institution and practicing physician where this report was generated.  [employer] will not be held responsible for the validity of the ICD and CPT codes included on this report.  AMA assumes no liability for data contained or not contained herein. CPT is a Designer, television/film set of the [employer].   PATIENT NAME:  Stephen Palmer, Stephen Palmer MR#: [id number]

## 2015-11-25 ENCOUNTER — Encounter (HOSPITAL_COMMUNITY): Payer: Self-pay | Admitting: General Surgery

## 2015-12-22 DIAGNOSIS — E119 Type 2 diabetes mellitus without complications: Secondary | ICD-10-CM | POA: Diagnosis not present

## 2015-12-22 DIAGNOSIS — E663 Overweight: Secondary | ICD-10-CM | POA: Diagnosis not present

## 2015-12-22 DIAGNOSIS — F419 Anxiety disorder, unspecified: Secondary | ICD-10-CM | POA: Diagnosis not present

## 2015-12-22 DIAGNOSIS — Z1389 Encounter for screening for other disorder: Secondary | ICD-10-CM | POA: Diagnosis not present

## 2015-12-22 DIAGNOSIS — F22 Delusional disorders: Secondary | ICD-10-CM | POA: Diagnosis not present

## 2015-12-22 DIAGNOSIS — Z6827 Body mass index (BMI) 27.0-27.9, adult: Secondary | ICD-10-CM | POA: Diagnosis not present

## 2016-01-31 DIAGNOSIS — L254 Unspecified contact dermatitis due to food in contact with skin: Secondary | ICD-10-CM | POA: Diagnosis not present

## 2016-01-31 DIAGNOSIS — Z1389 Encounter for screening for other disorder: Secondary | ICD-10-CM | POA: Diagnosis not present

## 2016-01-31 DIAGNOSIS — E663 Overweight: Secondary | ICD-10-CM | POA: Diagnosis not present

## 2016-01-31 DIAGNOSIS — R972 Elevated prostate specific antigen [PSA]: Secondary | ICD-10-CM | POA: Diagnosis not present

## 2016-01-31 DIAGNOSIS — Z6825 Body mass index (BMI) 25.0-25.9, adult: Secondary | ICD-10-CM | POA: Diagnosis not present

## 2016-02-01 DIAGNOSIS — R972 Elevated prostate specific antigen [PSA]: Secondary | ICD-10-CM | POA: Diagnosis not present

## 2016-02-01 DIAGNOSIS — Z1389 Encounter for screening for other disorder: Secondary | ICD-10-CM | POA: Diagnosis not present

## 2016-02-01 DIAGNOSIS — L254 Unspecified contact dermatitis due to food in contact with skin: Secondary | ICD-10-CM | POA: Diagnosis not present

## 2016-02-01 DIAGNOSIS — Z6825 Body mass index (BMI) 25.0-25.9, adult: Secondary | ICD-10-CM | POA: Diagnosis not present

## 2016-03-23 DIAGNOSIS — Z1389 Encounter for screening for other disorder: Secondary | ICD-10-CM | POA: Diagnosis not present

## 2016-03-23 DIAGNOSIS — M542 Cervicalgia: Secondary | ICD-10-CM | POA: Diagnosis not present

## 2016-03-23 DIAGNOSIS — Z6825 Body mass index (BMI) 25.0-25.9, adult: Secondary | ICD-10-CM | POA: Diagnosis not present

## 2016-03-23 DIAGNOSIS — J441 Chronic obstructive pulmonary disease with (acute) exacerbation: Secondary | ICD-10-CM | POA: Diagnosis not present

## 2016-03-23 DIAGNOSIS — F419 Anxiety disorder, unspecified: Secondary | ICD-10-CM | POA: Diagnosis not present

## 2016-03-29 ENCOUNTER — Encounter: Payer: Self-pay | Admitting: Vascular Surgery

## 2016-04-05 ENCOUNTER — Ambulatory Visit (HOSPITAL_COMMUNITY)
Admission: RE | Admit: 2016-04-05 | Discharge: 2016-04-05 | Disposition: A | Discharge status: Home / Self Care | Payer: Medicare Other | Source: Ambulatory Visit | Attending: Vascular Surgery | Admitting: Vascular Surgery

## 2016-04-05 ENCOUNTER — Ambulatory Visit (INDEPENDENT_AMBULATORY_CARE_PROVIDER_SITE_OTHER)
Admission: RE | Admit: 2016-04-05 | Discharge: 2016-04-05 | Disposition: A | Discharge status: Home / Self Care | Payer: Medicare Other | Source: Ambulatory Visit | Attending: Vascular Surgery | Admitting: Vascular Surgery

## 2016-04-05 ENCOUNTER — Ambulatory Visit (INDEPENDENT_AMBULATORY_CARE_PROVIDER_SITE_OTHER): Payer: Medicare Other | Admitting: Family

## 2016-04-05 ENCOUNTER — Encounter: Payer: Self-pay | Admitting: Family

## 2016-04-05 VITALS — BP 119/82 | HR 88 | Temp 98.0°F | Resp 18 | Ht 70.0 in | Wt 173.0 lb

## 2016-04-05 DIAGNOSIS — I714 Abdominal aortic aneurysm, without rupture, unspecified: Secondary | ICD-10-CM

## 2016-04-05 DIAGNOSIS — I70212 Atherosclerosis of native arteries of extremities with intermittent claudication, left leg: Secondary | ICD-10-CM | POA: Diagnosis not present

## 2016-04-05 DIAGNOSIS — F419 Anxiety disorder, unspecified: Secondary | ICD-10-CM | POA: Insufficient documentation

## 2016-04-05 DIAGNOSIS — E119 Type 2 diabetes mellitus without complications: Secondary | ICD-10-CM | POA: Insufficient documentation

## 2016-04-05 DIAGNOSIS — Z720 Tobacco use: Secondary | ICD-10-CM | POA: Diagnosis not present

## 2016-04-05 DIAGNOSIS — R0989 Other specified symptoms and signs involving the circulatory and respiratory systems: Secondary | ICD-10-CM | POA: Diagnosis present

## 2016-04-05 DIAGNOSIS — F17200 Nicotine dependence, unspecified, uncomplicated: Secondary | ICD-10-CM

## 2016-04-05 NOTE — Progress Notes (Signed)
[facility] [place]  Established Abdominal Aortic Aneurysm  History of Present Illness  Stephen Palmer is a 65 y.o. (1951-05-30) male patient of Dr. Scot Dock initially referred by Dr. Gerarda Fraction for evaluation of an abdominal aortic aneurysm. He was having some vague abdominal pain. He had an ultrasound of the aorta at [facility] on 09/15/2015 which showed a 4.4 cm abdominal aortic aneurysm. Currently he is not having significant abdominal pain. He does have some chronic low back pain, states he has spondylosis. He sometimes has no pain in his left low back and hip with walking, has pain now in his left hip with sitting. He denies any history of rest pain or nonhealing ulcers.  The left hip pain will sometimes radiate laterally down left leg. He is unaware of any family history of abdominal aortic aneurysms although he studies state that his sister had 3 brain aneurysms.  He states he only has mid pubic abdominal pain when he has a BM, pt states this started after his colonoscopy; he denies any other abdominal pain.  He finished a course of prednisone a week ago for dyspnea. He has been having all over body aches for about a week, not worsening. He has not had a bowel movement in 4-5 days, has tried mineral oil and Metamucil. He has urinary frequency in the last month, denies dysuria. He states his PSA is elevated and is scheduled to see a urologist later in April 2017.  He states he takes xanax and has anxiety. His son died in March 16, 2012, he and his wife are struggling with this. Pt states he suffered stress as a child, states he has seen several mental health specialists and "they cannot help me".   The patient denies history of stroke or TIA symptoms.  Pt Diabetic: Yes, states last A1C was 6.7 about July or August 2016 Pt smoker: smoker  (2 ppd, started at age 23 yrs)   Past Medical History  Diagnosis Date  . Anxiety   . Diabetes mellitus without complication  ([place])   . COPD (chronic obstructive pulmonary disease) ([place])   . Aortic aneurysm ([facility])     4.4 will have intervention when 5.5   Past Surgical History  Procedure Laterality Date  . Fracture surgery    . Sinus surgery with instatrak    . Colonoscopy N/A 11/23/2015    Procedure: COLONOSCOPY;  Surgeon: Aviva Signs, MD;  Location: [facility];  Service: Gastroenterology;  Laterality: N/A;   Social History Social History   Social History  . Marital Status: Married    Spouse Name: N/A  . Number of Children: N/A  . Years of Education: N/A   Occupational History  . Not on file.   Social History Main Topics  . Smoking status: Current Every Day Smoker -- 2.00 packs/day for 44 years    Types: Cigarettes  . Smokeless tobacco: Not on file  . Alcohol Use: No  . Drug Use: No  . Sexual Activity: Not on file   Other Topics Concern  . Not on file   Social History Narrative   Family History Family History  Problem Relation Age of Onset  . Heart disease Father     before age 36    Current Outpatient Prescriptions on File Prior to Visit  Medication Sig Dispense Refill  . ALPRAZolam (XANAX) 1 MG tablet Take 0.5 mg by mouth 2 (two) times daily.    . citalopram (CELEXA) 40 MG tablet Take 40  mg by mouth daily.     . cyclobenzaprine (FLEXERIL) 10 MG tablet Take 10 mg by mouth 3 (three) times daily as needed.    Marland Kitchen glipiZIDE (GLUCOTROL) 5 MG tablet Take 5 mg by mouth daily before breakfast.    . losartan (COZAAR) 100 MG tablet Take 100 mg by mouth daily. Reported on 04/05/2016     No current facility-administered medications on file prior to visit.   Allergies  Allergen Reactions  . Sulfa Antibiotics Hives, Itching and Swelling    Tongue swelling    ROS: See HPI for pertinent positives and negatives.  Physical Examination  Filed Vitals:   04/05/16 1022  BP: 119/82  Pulse: 88  Temp: 98 F (36.7 C)  TempSrc: Oral  Resp: 18  Height: '5\' 10"'$  (1.778 m)  Weight: 173 lb (78.472  kg)  SpO2: 96%   Body mass index is 24.82 kg/(m^2).  General: A&O x 3, WD.  Pulmonary: Sym exp, fair air movt, CTAB, no rales, rhonchi, or wheezing.  Cardiac: RRR, Nl S1, S2, no detected murmur.   Carotid Bruits Right Left   Negative Negative   Aorta is moderately palpable Radial pulses are 2+ palpable and =                          VASCULAR EXAM:                                                                                                         LE Pulses Right Left       FEMORAL  2+ palpable  3+ palpable        POPLITEAL  not palpable   not palpable       POSTERIOR TIBIAL  2+ palpable   2+ palpable        DORSALIS PEDIS      ANTERIOR TIBIAL 1+ palpable  1+ palpable     Gastrointestinal: soft, NTND, -G/R, - HSM, reducible ventral hernia, - CVAT B.  Musculoskeletal: M/S 5/5 throughout, Extremities without ischemic changes.  Neurologic: CN 2-12 intact, Pain and light touch intact in extremities are intact except, Motor exam as listed above.   Non-Invasive Vascular Imaging  AAA Duplex (04/05/2016)  Previous size: 4.4 cm (outside facility) (Date: 09/15/15)  Current size:  4.1 cm (Date: 04/05/2016) Limited visualization of the common iliac arteries due to overlying bowel gas   ABI (Date: 04/05/2016)  R: 1.12 (no previous), DP: triphasic, PT: triphasic, TBI: 0.79  L: 1.08 (no previous), DP: triphasic, PT: triphasic, TBI: 0.85   Medical Decision Making  The patient is a 65 y.o. male who presents with asymptomatic AAA with no increase in size. His left low back, left hip, and radiating left leg pain with accompanying lateral left leg numbness is consistent with radiculopathy and his known spine issues.  ABI's and TBI's today are normal with all triphasic waveforms, no evidence of atherosclerotic arterial occlusive disease in the lower extremities.  The patient was counseled re smoking cessation and given several free resources re  smoking cessation.     Based  on this patient's exam and diagnostic studies, the patient will follow up in 1 year  with the following studies: AAA duplex.  Consideration for repair of AAA would be made when the size is 5.5 cm, growth > 1 cm/yr, and symptomatic status.  I emphasized the importance of maximal medical management including strict control of blood pressure, blood glucose, and lipid levels, antiplatelet agents, obtaining regular exercise, and cessation of smoking.   The patient was given information about AAA including signs, symptoms, treatment, and how to minimize the risk of enlargement and rupture of aneurysms.    The patient was advised to call 911 should the patient experience sudden onset abdominal or back pain.   Thank you for allowing [place] to participate in this patient's care.  Clemon Chambers, RN, MSN, FNP-C [facility] [place] Office: [phone]  Clinic Physician: Scot Dock  04/05/2016, 10:32 AM

## 2016-04-05 NOTE — Patient Instructions (Signed)
Abdominal Aortic Aneurysm An aneurysm is a weakened or damaged part of an artery wall that bulges from the normal force of blood pumping through the body. An abdominal aortic aneurysm is an aneurysm that occurs in the lower part of the aorta, the main artery of the body.  The major concern with an abdominal aortic aneurysm is that it can enlarge and burst (rupture) or blood can flow between the layers of the wall of the aorta through a tear (aorticdissection). Both of these conditions can cause bleeding inside the body and can be life threatening unless diagnosed and treated promptly. CAUSES  The exact cause of an abdominal aortic aneurysm is unknown. Some contributing factors are:   A hardening of the arteries caused by the buildup of fat and other substances in the lining of a blood vessel (arteriosclerosis).  Inflammation of the walls of an artery (arteritis).   Connective tissue diseases, such as Marfan syndrome.   Abdominal trauma.   An infection, such as syphilis or staphylococcus, in the wall of the aorta (infectious aortitis) caused by bacteria. RISK FACTORS  Risk factors that contribute to an abdominal aortic aneurysm may include:  Age older than 60 years.   High blood pressure (hypertension).  Male gender.  Ethnicity (white race).  Obesity.  Family history of aneurysm (first degree relatives only).  Tobacco use. PREVENTION  The following healthy lifestyle habits may help decrease your risk of abdominal aortic aneurysm:  Quitting smoking. Smoking can raise your blood pressure and cause arteriosclerosis.  Limiting or avoiding alcohol.  Keeping your blood pressure, blood sugar level, and cholesterol levels within normal limits.  Decreasing your salt intake. In somepeople, too much salt can raise blood pressure and increase your risk of abdominal aortic aneurysm.  Eating a diet low in saturated fats and cholesterol.  Increasing your fiber intake by including  whole grains, vegetables, and fruits in your diet. Eating these foods may help lower blood pressure.  Maintaining a healthy weight.  Staying physically active and exercising regularly. SYMPTOMS  The symptoms of abdominal aortic aneurysm may vary depending on the size and rate of growth of the aneurysm.Most grow slowly and do not have any symptoms. When symptoms do occur, they may include:  Pain (abdomen, side, lower back, or groin). The pain may vary in intensity. A sudden onset of severe pain may indicate that the aneurysm has ruptured.  Feeling full after eating only small amounts of food.  Nausea or vomiting or both.  Feeling a pulsating lump in the abdomen.  Feeling faint or passing out. DIAGNOSIS  Since most unruptured abdominal aortic aneurysms have no symptoms, they are often discovered during diagnostic exams for other conditions. An aneurysm may be found during the following procedures:  Ultrasonography (A one-time screening for abdominal aortic aneurysm by ultrasonography is also recommended for all men aged 65-75 years who have ever smoked).  X-ray exams.  A computed tomography (CT).  Magnetic resonance imaging (MRI).  Angiography or arteriography. TREATMENT  Treatment of an abdominal aortic aneurysm depends on the size of your aneurysm, your age, and risk factors for rupture. Medication to control blood pressure and pain may be used to manage aneurysms smaller than 6 cm. Regular monitoring for enlargement may be recommended by your caregiver if:  The aneurysm is 3-4 cm in size (an annual ultrasonography may be recommended).  The aneurysm is 4-4.5 cm in size (an ultrasonography every 6 months may be recommended).  The aneurysm is larger than 4.5 cm in   size (your caregiver may ask that you be examined by a vascular surgeon). If your aneurysm is larger than 6 cm, surgical repair may be recommended. There are two main methods for repair of an aneurysm:   Endovascular  repair (a minimally invasive surgery). This is done most often.  Open repair. This method is used if an endovascular repair is not possible.   This information is not intended to replace advice given to you by your health care provider. Make sure you discuss any questions you have with your health care provider.   Document Released: 09/13/2005 Document Revised: 03/31/2013 Document Reviewed: 01/03/2013 Elsevier Interactive Patient Education 2016 [employer].    Smoking Cessation, Tips for Success If you are ready to quit smoking, congratulations! You have chosen to help yourself be healthier. Cigarettes bring nicotine, tar, carbon monoxide, and other irritants into your body. Your lungs, heart, and blood vessels will be able to work better without these poisons. There are many different ways to quit smoking. Nicotine gum, nicotine patches, a nicotine inhaler, or nicotine nasal spray can help with physical craving. Hypnosis, support groups, and medicines help break the habit of smoking. WHAT THINGS CAN I DO TO MAKE QUITTING EASIER?  Here are some tips to help you quit for good:  Pick a date when you will quit smoking completely. Tell all of your friends and family about your plan to quit on that date.  Do not try to slowly cut down on the number of cigarettes you are smoking. Pick a quit date and quit smoking completely starting on that day.  Throw away all cigarettes.   Clean and remove all ashtrays from your home, work, and car.  On a card, write down your reasons for quitting. Carry the card with you and read it when you get the urge to smoke.  Cleanse your body of nicotine. Drink enough water and fluids to keep your urine clear or pale yellow. Do this after quitting to flush the nicotine from your body.  Learn to predict your moods. Do not let a bad situation be your excuse to have a cigarette. Some situations in your life might tempt you into wanting a cigarette.  Never have "just  one" cigarette. It leads to wanting another and another. Remind yourself of your decision to quit.  Change habits associated with smoking. If you smoked while driving or when feeling stressed, try other activities to replace smoking. Stand up when drinking your coffee. Brush your teeth after eating. Sit in a different chair when you read the paper. Avoid alcohol while trying to quit, and try to drink fewer caffeinated beverages. Alcohol and caffeine may urge you to smoke.  Avoid foods and drinks that can trigger a desire to smoke, such as sugary or spicy foods and alcohol.  Ask people who smoke not to smoke around you.  Have something planned to do right after eating or having a cup of coffee. For example, plan to take a walk or exercise.  Try a relaxation exercise to calm you down and decrease your stress. Remember, you may be tense and nervous for the first 2 weeks after you quit, but this will pass.  Find new activities to keep your hands busy. Play with a pen, coin, or rubber band. Doodle or draw things on paper.  Brush your teeth right after eating. This will help cut down on the craving for the taste of tobacco after meals. You can also try mouthwash.   Use oral  substitutes in place of cigarettes. Try using lemon drops, carrots, cinnamon sticks, or chewing gum. Keep them handy so they are available when you have the urge to smoke.  When you have the urge to smoke, try deep breathing.  Designate your home as a nonsmoking area.  If you are a heavy smoker, ask your health care provider about a prescription for nicotine chewing gum. It can ease your withdrawal from nicotine.  Reward yourself. Set aside the cigarette money you save and buy yourself something nice.  Look for support from others. Join a support group or smoking cessation program. Ask someone at home or at work to help you with your plan to quit smoking.  Always ask yourself, "Do I need this cigarette or is this just a  reflex?" Tell yourself, "Today, I choose not to smoke," or "I do not want to smoke." You are reminding yourself of your decision to quit.  Do not replace cigarette smoking with electronic cigarettes (commonly called e-cigarettes). The safety of e-cigarettes is unknown, and some may contain harmful chemicals.  If you relapse, do not give up! Plan ahead and think about what you will do the next time you get the urge to smoke. HOW WILL I FEEL WHEN I QUIT SMOKING? You may have symptoms of withdrawal because your body is used to nicotine (the addictive substance in cigarettes). You may crave cigarettes, be irritable, feel very hungry, cough often, get headaches, or have difficulty concentrating. The withdrawal symptoms are only temporary. They are strongest when you first quit but will go away within 10-14 days. When withdrawal symptoms occur, stay in control. Think about your reasons for quitting. Remind yourself that these are signs that your body is healing and getting used to being without cigarettes. Remember that withdrawal symptoms are easier to treat than the major diseases that smoking can cause.  Even after the withdrawal is over, expect periodic urges to smoke. However, these cravings are generally short lived and will go away whether you smoke or not. Do not smoke! WHAT RESOURCES ARE AVAILABLE TO HELP ME QUIT SMOKING? Your health care provider can direct you to community resources or hospitals for support, which may include:  Group support.  Education.  Hypnosis.  Therapy.   This information is not intended to replace advice given to you by your health care provider. Make sure you discuss any questions you have with your health care provider.   Document Released: 09/01/2004 Document Revised: 12/25/2014 Document Reviewed: 05/22/2013 Elsevier Interactive Patient Education [employer].

## 2016-04-10 DIAGNOSIS — R3915 Urgency of urination: Secondary | ICD-10-CM | POA: Diagnosis not present

## 2016-04-10 DIAGNOSIS — R972 Elevated prostate specific antigen [PSA]: Secondary | ICD-10-CM | POA: Diagnosis not present

## 2016-04-11 DIAGNOSIS — R3915 Urgency of urination: Secondary | ICD-10-CM | POA: Diagnosis not present

## 2016-04-11 DIAGNOSIS — R972 Elevated prostate specific antigen [PSA]: Secondary | ICD-10-CM | POA: Diagnosis not present

## 2016-04-20 DIAGNOSIS — I10 Essential (primary) hypertension: Secondary | ICD-10-CM | POA: Diagnosis not present

## 2016-04-20 DIAGNOSIS — Z1389 Encounter for screening for other disorder: Secondary | ICD-10-CM | POA: Diagnosis not present

## 2016-04-20 DIAGNOSIS — E119 Type 2 diabetes mellitus without complications: Secondary | ICD-10-CM | POA: Diagnosis not present

## 2016-04-20 DIAGNOSIS — Z6824 Body mass index (BMI) 24.0-24.9, adult: Secondary | ICD-10-CM | POA: Diagnosis not present

## 2016-05-12 ENCOUNTER — Other Ambulatory Visit: Payer: Self-pay | Admitting: *Deleted

## 2016-05-12 DIAGNOSIS — I714 Abdominal aortic aneurysm, without rupture, unspecified: Secondary | ICD-10-CM

## 2016-05-16 DIAGNOSIS — R972 Elevated prostate specific antigen [PSA]: Secondary | ICD-10-CM | POA: Diagnosis not present

## 2016-06-13 DIAGNOSIS — R3915 Urgency of urination: Secondary | ICD-10-CM | POA: Diagnosis not present

## 2016-06-13 DIAGNOSIS — R972 Elevated prostate specific antigen [PSA]: Secondary | ICD-10-CM | POA: Diagnosis not present

## 2016-06-27 DIAGNOSIS — J439 Emphysema, unspecified: Secondary | ICD-10-CM | POA: Diagnosis not present

## 2016-06-27 DIAGNOSIS — F419 Anxiety disorder, unspecified: Secondary | ICD-10-CM | POA: Diagnosis not present

## 2016-06-27 DIAGNOSIS — Z6824 Body mass index (BMI) 24.0-24.9, adult: Secondary | ICD-10-CM | POA: Diagnosis not present

## 2016-06-27 DIAGNOSIS — Z1389 Encounter for screening for other disorder: Secondary | ICD-10-CM | POA: Diagnosis not present

## 2016-06-27 DIAGNOSIS — F4310 Post-traumatic stress disorder, unspecified: Secondary | ICD-10-CM | POA: Diagnosis not present

## 2016-07-20 DIAGNOSIS — R972 Elevated prostate specific antigen [PSA]: Secondary | ICD-10-CM | POA: Diagnosis not present

## 2016-07-20 DIAGNOSIS — C61 Malignant neoplasm of prostate: Secondary | ICD-10-CM | POA: Diagnosis not present

## 2016-07-20 DIAGNOSIS — N4289 Other specified disorders of prostate: Secondary | ICD-10-CM | POA: Diagnosis not present

## 2016-07-28 DIAGNOSIS — R3915 Urgency of urination: Secondary | ICD-10-CM | POA: Diagnosis not present

## 2016-07-28 DIAGNOSIS — C61 Malignant neoplasm of prostate: Secondary | ICD-10-CM | POA: Diagnosis not present

## 2016-08-03 DIAGNOSIS — C61 Malignant neoplasm of prostate: Secondary | ICD-10-CM | POA: Diagnosis not present

## 2016-08-04 DIAGNOSIS — K5790 Diverticulosis of intestine, part unspecified, without perforation or abscess without bleeding: Secondary | ICD-10-CM | POA: Diagnosis not present

## 2016-08-04 DIAGNOSIS — C61 Malignant neoplasm of prostate: Secondary | ICD-10-CM | POA: Diagnosis not present

## 2016-08-04 DIAGNOSIS — I7090 Unspecified atherosclerosis: Secondary | ICD-10-CM | POA: Diagnosis not present

## 2016-08-07 DIAGNOSIS — C61 Malignant neoplasm of prostate: Secondary | ICD-10-CM | POA: Diagnosis not present

## 2016-08-31 DIAGNOSIS — J439 Emphysema, unspecified: Secondary | ICD-10-CM | POA: Diagnosis not present

## 2016-08-31 DIAGNOSIS — E1165 Type 2 diabetes mellitus with hyperglycemia: Secondary | ICD-10-CM | POA: Diagnosis not present

## 2016-08-31 DIAGNOSIS — E538 Deficiency of other specified B group vitamins: Secondary | ICD-10-CM | POA: Diagnosis not present

## 2016-08-31 DIAGNOSIS — C61 Malignant neoplasm of prostate: Secondary | ICD-10-CM | POA: Diagnosis not present

## 2016-08-31 DIAGNOSIS — E782 Mixed hyperlipidemia: Secondary | ICD-10-CM | POA: Diagnosis not present

## 2016-08-31 DIAGNOSIS — I10 Essential (primary) hypertension: Secondary | ICD-10-CM | POA: Diagnosis not present

## 2016-08-31 DIAGNOSIS — Z6824 Body mass index (BMI) 24.0-24.9, adult: Secondary | ICD-10-CM | POA: Diagnosis not present

## 2016-08-31 DIAGNOSIS — E119 Type 2 diabetes mellitus without complications: Secondary | ICD-10-CM | POA: Diagnosis not present

## 2016-08-31 DIAGNOSIS — F22 Delusional disorders: Secondary | ICD-10-CM | POA: Diagnosis not present

## 2016-08-31 DIAGNOSIS — E559 Vitamin D deficiency, unspecified: Secondary | ICD-10-CM | POA: Diagnosis not present

## 2016-09-04 DIAGNOSIS — C61 Malignant neoplasm of prostate: Secondary | ICD-10-CM | POA: Diagnosis not present

## 2016-09-05 ENCOUNTER — Other Ambulatory Visit (HOSPITAL_COMMUNITY): Payer: Self-pay | Admitting: Urology

## 2016-09-05 DIAGNOSIS — C61 Malignant neoplasm of prostate: Secondary | ICD-10-CM

## 2016-09-11 ENCOUNTER — Encounter: Payer: Self-pay | Admitting: Medical Oncology

## 2016-09-19 ENCOUNTER — Ambulatory Visit (HOSPITAL_COMMUNITY)
Admission: RE | Admit: 2016-09-19 | Discharge: 2016-09-19 | Disposition: A | Discharge status: Home / Self Care | Payer: Medicare Other | Source: Ambulatory Visit | Attending: Urology | Admitting: Urology

## 2016-09-19 ENCOUNTER — Encounter: Payer: Self-pay | Admitting: Medical Oncology

## 2016-09-19 ENCOUNTER — Encounter (HOSPITAL_COMMUNITY): Admission: RE | Admit: 2016-09-19 | Payer: Medicare Other | Source: Ambulatory Visit

## 2016-09-19 DIAGNOSIS — M19012 Primary osteoarthritis, left shoulder: Secondary | ICD-10-CM | POA: Insufficient documentation

## 2016-09-19 DIAGNOSIS — M5030 Other cervical disc degeneration, unspecified cervical region: Secondary | ICD-10-CM | POA: Diagnosis not present

## 2016-09-19 DIAGNOSIS — M19071 Primary osteoarthritis, right ankle and foot: Secondary | ICD-10-CM | POA: Insufficient documentation

## 2016-09-19 DIAGNOSIS — C61 Malignant neoplasm of prostate: Secondary | ICD-10-CM | POA: Insufficient documentation

## 2016-09-19 DIAGNOSIS — M170 Bilateral primary osteoarthritis of knee: Secondary | ICD-10-CM | POA: Insufficient documentation

## 2016-09-19 DIAGNOSIS — M19011 Primary osteoarthritis, right shoulder: Secondary | ICD-10-CM | POA: Diagnosis not present

## 2016-09-19 MED ORDER — TECHNETIUM TC 99M MEDRONATE IV KIT
21.0000 | PACK | Freq: Once | INTRAVENOUS | Status: AC | PRN
  Administered 2016-09-19: 21 via INTRAVENOUS

## 2016-09-19 NOTE — Progress Notes (Signed)
I called pt to introduce myself as the Prostate Nurse Navigator and the Coordinator of the Prostate [place].  1. I confirmed with the patient he is aware of his referral to the clinic October 13 arriving at 7:30am.  2. I discussed the format of the clinic and the physicians he will be seeing that day.  3. I discussed where the clinic is located and how to contact me.  4. I confirmed his address and informed him I would be mailing a packet of information and forms to be completed. I asked him to bring them with him the day of his appointment.   He voiced understanding of the above. I asked him to call me if he has any questions or concerns regarding his appointments or the forms he needs to complete.

## 2016-09-28 ENCOUNTER — Telehealth: Payer: Self-pay | Admitting: Medical Oncology

## 2016-09-28 ENCOUNTER — Encounter: Payer: Self-pay | Admitting: Radiation Oncology

## 2016-09-28 NOTE — Progress Notes (Signed)
GU Location of Tumor / Histology: prostatic adenocarcinoma   If Prostate Cancer, Gleason Score is (4 + 5) and PSA is (8.1)  Stephen Palmer presented with an elevated PSA of 7.4 in July of 2017. He was treated for prostatitis. PSA in August proved to be higher.  Biopsies of prostate (if applicable) revealed:    Past/Anticipated interventions by urology, if any: biopsy and referral to [facility]  Past/Anticipated interventions by medical oncology, if any: no  Weight changes, if any: no  Bowel/Bladder complaints, if any: ED, urinary frequency, and nocturia   Nausea/Vomiting, if any: no  Pain issues, if any:  no  SAFETY ISSUES:  Prior radiation? No "adamant he was injected with plutonium citrate as a child.  Pacemaker/ICD? no  Possible current pregnancy? no  Is the patient on methotrexate? no  Current Complaints / other details:  65 year old male.

## 2016-09-28 NOTE — Progress Notes (Signed)
Stephen Palmer confirmed his appointment for Prostate [facility] 09/29/16 arriving at 7:45am. We reviewed the directions to  [facility] and how to register. He did receive his medical forms and I reminded him to bring them in the morning. All questions were answered and I asked him to call me back if any concerns or further questions. He voiced understanding.

## 2016-09-29 ENCOUNTER — Ambulatory Visit (HOSPITAL_BASED_OUTPATIENT_CLINIC_OR_DEPARTMENT_OTHER): Payer: Medicare Other | Admitting: Oncology

## 2016-09-29 ENCOUNTER — Ambulatory Visit
Admission: RE | Admit: 2016-09-29 | Discharge: 2016-09-29 | Disposition: A | Discharge status: Home / Self Care | Payer: Medicare Other | Source: Ambulatory Visit | Attending: Radiation Oncology | Admitting: Radiation Oncology

## 2016-09-29 ENCOUNTER — Encounter: Payer: Self-pay | Admitting: Medical Oncology

## 2016-09-29 ENCOUNTER — Encounter: Payer: Self-pay | Admitting: Radiation Oncology

## 2016-09-29 VITALS — BP 134/85 | HR 72 | Resp 16 | Wt 160.8 lb

## 2016-09-29 DIAGNOSIS — N400 Enlarged prostate without lower urinary tract symptoms: Secondary | ICD-10-CM | POA: Diagnosis not present

## 2016-09-29 DIAGNOSIS — C775 Secondary and unspecified malignant neoplasm of intrapelvic lymph nodes: Secondary | ICD-10-CM | POA: Insufficient documentation

## 2016-09-29 DIAGNOSIS — I10 Essential (primary) hypertension: Secondary | ICD-10-CM | POA: Insufficient documentation

## 2016-09-29 DIAGNOSIS — C61 Malignant neoplasm of prostate: Secondary | ICD-10-CM | POA: Insufficient documentation

## 2016-09-29 DIAGNOSIS — F17210 Nicotine dependence, cigarettes, uncomplicated: Secondary | ICD-10-CM | POA: Insufficient documentation

## 2016-09-29 DIAGNOSIS — E119 Type 2 diabetes mellitus without complications: Secondary | ICD-10-CM | POA: Diagnosis not present

## 2016-09-29 DIAGNOSIS — J449 Chronic obstructive pulmonary disease, unspecified: Secondary | ICD-10-CM | POA: Insufficient documentation

## 2016-09-29 DIAGNOSIS — Z882 Allergy status to sulfonamides status: Secondary | ICD-10-CM | POA: Diagnosis not present

## 2016-09-29 DIAGNOSIS — E785 Hyperlipidemia, unspecified: Secondary | ICD-10-CM | POA: Diagnosis not present

## 2016-09-29 HISTORY — DX: Essential (primary) hypertension: I10

## 2016-09-29 HISTORY — DX: Malignant neoplasm of prostate: C61

## 2016-09-29 NOTE — Consult Note (Signed)
[facility]     09/29/2016   --------------------------------------------------------------------------------   Rolland Bimler  MRN: [id number]  PRIMARY CARE:  Sharilyn Sites, MD  DOB: [birth year], 65 year old Male  REFERRING:  Raynelle Bring, MD  SSN: -[id number]  PROVIDER:  Raynelle Bring, M.D.    LOCATION:  [facility] [phone]   --------------------------------------------------------------------------------   CC/HPI: CC: Prostate Cancer   Physician requesting consult: Dr. Clyde Lundborg  PCP: Dr. Sharilyn Sites  Location of consult: [facility] - Prostate Cancer Multidisciplinary Clinic   Mr. Lightsey is a 65 year old gentleman who was found to have an elevated PSA of 7.4 and lower urinary tract symptoms. He was treated for prostatitis with doxycycline and tamsulosin but his repeat PSA remained elevated at 8.1 (14.8% free). He underwent a TRUS biopsy of the prostate on 07/20/16 demonstrating Gleason 4+5=9 adenocarcinoma with 16 out of 16 cores positive for malignancy.   Family history: None   Imaging studies: He underwent a CT of the pelvis on 08/04/16 - Negative for lymphadenopathy or metastatic disease, 2.8 cm aortic aneurysm, Bone scan (09/19/16) - Degenerative changes but no obvious metastatic disease   PMH: He has a history of hypertension, diabetes, BPH, and hyperlipidemia. He has a history of tobacco use with questionable COPD. He does smoke 2 packs per day and has done this since age 12. He also is adamant that he was injected with "plutonium citrate" as a child and feels like this may be responsible for his pulmonary findings. He is not on any medication for COPD.  PSH: No abdominal surgeries.   TNM stage: cT1c N0 Mx  PSA: 8.1  Gleason score: 4+5=9  Biopsy (07/20/16 - read by Dr. Mark Martinique, [facility], [place] # [phone]): 16/16 cores positive  Left: L apex (2/2 cores, 50%, 75%, 4+5=9), L mid (3/3 cores, 50%, 60%, 70%, 4+5=9), L base (3/3  cores, 50%, 50%. 50%, 4+5=9)  Right: R apex (2/2 cores, 50%, 75%, 4+5=9), R mid (3/3 cores, 30%, 30%, 50%, 4+5=9), R base (3/3 cores, 50%, 40%, 30%, 4+5=9)  Prostate volume: 25.2 cc   Nomogram  OC disease: 25%  EPE: 72%  SVI: 17%  LNI: 19%  PFS (5 year, 10 year): 27%,16%   Urinary function: IPSS is 8.  Erectile function: SHIM score is 4. He has not attempted sexual intercourse recently but rates his confidence that he could get and keep an erection as very low.    Interval history:   He returns today at the multidisciplinary clinic to further discuss his options after undergoing a bone scan to complete his staging evaluation. He is seen today in the company of his wife.     ALLERGIES: Sulfa    MEDICATIONS: Tamsulosin Hcl 0.4 mg capsule, ext release 24 hr  Alprazolam 1 mg tablet  Citalopram Hbr 40 mg tablet  Glipizide 5 mg tablet  Losartan Potassium 50 mg tablet  Vitamin D     GU PSH: No GU PSH    NON-GU PSH: Colonoscopy Sinus Surgery    GU PMH: Prostate Cancer - 09/04/2016    NON-GU PMH: Anxiety Depression Diabetes Type 2 Heartburn Hypercholesterolemia Hypertension    FAMILY HISTORY: 1 Daughter - Daughter Death - Father   SOCIAL HISTORY: Marital Status: Married Current Smoking Status: Patient smokes. Smokes 2 packs per day.  Has never drank.  Drinks 3 caffeinated drinks per day. 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    REVIEW OF SYSTEMS:    GU Review Male:  Patient denies frequent urination, hard to postpone urination, burning/ pain with urination, get up at night to urinate, leakage of urine, stream starts and stops, trouble starting your streams, and have to strain to urinate .  Gastrointestinal (Upper):   Patient denies nausea and vomiting.  Gastrointestinal (Lower):   Patient denies constipation and diarrhea.  Constitutional:   Patient denies fever, night sweats, weight loss, and fatigue.  Skin:   Patient denies skin rash/ lesion and itching.   Eyes:   Patient denies blurred vision and double vision.  Ears/ Nose/ Throat:   Patient denies sore throat and sinus problems.  Hematologic/Lymphatic:   Patient denies swollen glands and easy bruising.  Cardiovascular:   Patient denies leg swelling and chest pains.  Respiratory:   Patient denies cough and shortness of breath.  Endocrine:   Patient denies excessive thirst.  Musculoskeletal:   Patient denies back pain and joint pain.  Neurological:   Patient denies headaches and dizziness.  Psychologic:   Patient denies depression and anxiety.   VITAL SIGNS: None   MULTI-SYSTEM PHYSICAL EXAMINATION:    Constitutional: Well-nourished. No physical deformities. Normally developed. Good grooming.     PAST DATA REVIEWED:  Source Of History:  Patient  Lab Test Review:   PSA  Records Review:   Pathology Reports, Previous Patient Records  X-Ray Review: C.T. Pelvis: Reviewed Films.  Bone Scan: Reviewed Films.    Notes:                     His CT scan of the pelvis, bone scan, and pathology slides were reviewed in the multidisciplinary conference today.   PROCEDURES: None   ASSESSMENT:      ICD-10 Details  1 GU:   Prostate Cancer - C61    PLAN:           Document Letter(s):  Created for Patient: Clinical Summary         Notes:   1. Prostate cancer: Mr. Pangborn was seen by myself as well as Dr. Tammi Klippel and Dr. Alen Blew today. We reviewed his bone scan which fortunately was negative for metastatic disease. He has undergone a thorough discussion with each of [place] regarding his options for management of his high volume but clinically localized high-grade prostate cancer. After reviewing the pros and cons of primary surgery followed by the high probability of needing adjuvant radiation therapy plus or minus androgen deprivation or the alternative of approach of long-term androgen deprivation with radiation therapy, he has elected to proceed with the latter approach. I agreed with this decision  particularly considering his increased pulmonary risk of undergoing surgery based on his history of COPD, long smoking history, and some respiratory difficulty at baseline.   He will be scheduled to follow up with Dr. Exie Parody to begin androgen deprivation and for placement of fiducial markers in preparation for radiation therapy. Dr. Tammi Klippel will then arrange for him to begin radiation therapy in [place]. It has been recommended that he proceed with long-term androgen deprivation of 2 years concomitantly with his radiation therapy. Mr. Hyson will follow up with me as needed.   Cc: Dr. Zola Button  Dr. Tyler Pita  Dr. Clyde Lundborg  Dr. Sharilyn Sites

## 2016-09-29 NOTE — Progress Notes (Signed)
Radiation Oncology         [phone] ________________________________  Multidisciplinary Prostate Cancer Clinic  Initial Radiation Oncology Consultation  Name: Stephen Palmer MRN: [id number]  Date: 09/29/2016  DOB: [birth year]  BO:FBPZWCH,ENIDPOEU, PA-C  Raynelle Bring, MD   REFERRING PHYSICIAN: Raynelle Bring, MD  DIAGNOSIS: 65 y.o. gentleman with stage T1c adenocarcinoma of the prostate with a Gleason's score of 4+5 and a PSA of 8.1  No diagnosis found.  HISTORY OF PRESENT ILLNESS:Stephen Palmer is a 70 y.o. gentleman with a new diagnosis of prostate cancer. He was noted to have an elevated PSA of 7.4 by his urologist, Dr. Exie Parody. The patient proceeded to transrectal ultrasound with 6 biopsies of the prostate on 07/20/16.  The prostate volume measured 29 cc.  Out of 6 core biopsies,6 were positive.  The maximum Gleason score was 4+5, and this was seen in right base, right mid, right apex, left base, left mid, left apex.  The patient underwent a bone scan on 09/19/2016. This showed no definite osseous metastatic process. Patient also underwent a pelvic CT scan 08/04/16 which showed no indication of metastatic disease.The patient reviewed the biopsy results with his urologist and he has kindly been referred today to the multidisciplinary prostate cancer clinic for presentation of pathology and radiology studies in our conference for discussion of potential radiation treatment options and clinical evaluation.  PREVIOUS RADIATION THERAPY: No; "adamant he was injected with plutonium citrate as a child."  PAST MEDICAL HISTORY:  Past Medical History:  Diagnosis Date  . Anxiety   . Aortic aneurysm (HCC)    4.4 will have intervention when 5.5  . COPD (chronic obstructive pulmonary disease) ([place])   . Diabetes mellitus without complication ([place])   . Hypertension   . Prostate cancer ([place])      PAST SURGICAL HISTORY: Past Surgical History:  Procedure Laterality Date  . COLONOSCOPY N/A  11/23/2015   Procedure: COLONOSCOPY;  Surgeon: Aviva Signs, MD;  [facility];  Service: Gastroenterology;  Laterality: N/A;  . FRACTURE SURGERY    . PROSTATE BIOPSY    . SINUS SURGERY WITH INSTATRAK      FAMILY HISTORY: Family History  Problem Relation Age of Onset  . Heart disease Father     before age 82  . Cancer Neg Hx     SOCIAL HISTORY:  reports that he has been smoking Cigarettes.  He has a 88.00 pack-year smoking history. He has never used smokeless tobacco. He reports that he does not drink alcohol or use drugs. The patient lives outside of [place], [place]. He is retired, and accompanied by his wife.   ALLERGIES: Sulfa antibiotics  MEDICATIONS:  Current Outpatient Prescriptions  Medication Sig Dispense Refill  . ALPRAZolam (XANAX) 1 MG tablet Take 0.5 mg by mouth 2 (two) times daily.    . citalopram (CELEXA) 40 MG tablet Take 40 mg by mouth daily.     . cyclobenzaprine (FLEXERIL) 10 MG tablet Take 10 mg by mouth 3 (three) times daily as needed.    Marland Kitchen glipiZIDE (GLUCOTROL) 5 MG tablet Take 5 mg by mouth daily before breakfast.    . losartan (COZAAR) 100 MG tablet Take 100 mg by mouth daily. Reported on 04/05/2016     No current facility-administered medications for this encounter.     REVIEW OF SYSTEMS:  A 15 point review of systems is documented in the electronic medical record. On review of systems, the patient reports that he is doing well overall. He  denies any chest pain, shortness of breath, cough, fevers, chills, night sweats, unintended weight changes. He denies any bowel  disturbances, and denies abdominal pain, nausea or vomiting. He denies any new musculoskeletal or joint aches or pains. His IPSS score was 9 indicating moderate urinary outflow obstructive symptoms including urinary frequency and nocturia x 3.  He indicated that his erectile function is not able to complete sexual activity. A complete review of systems is obtained and is otherwise  negative.  PHYSICAL EXAM: This patient is in no acute distress.  He is alert and oriented.   vitals were not taken for this visit. In general this is a well appearing Caucasian male in no acute distress. He is alert and oriented x4 and appropriate throughout the examination. HEENT reveals that the patient is normocephalic, atraumatic. EOMs are intact. PERRLA. Skin is intact without any evidence of gross lesions. Cardiovascular exam reveals a regular rate and rhythm, no clicks rubs or murmurs are auscultated. Chest is clear to auscultation bilaterally. Lymphatic assessment is performed and does not reveal any adenopathy in the cervical, supraclavicular, axillary, or inguinal chains. Abdomen has active bowel sounds in all quadrants and is intact. The abdomen is soft, non tender, non distended. Lower extremities are negative for pretibial pitting edema, deep calf tenderness, cyanosis or clubbing..  Please note the digital rectal exam findings described above.   KPS = 100  100 - Normal; no complaints; no evidence of disease. 90   - Able to carry on normal activity; minor signs or symptoms of disease. 80   - Normal activity with effort; some signs or symptoms of disease. 84   - Cares for self; unable to carry on normal activity or to do active work. 60   - Requires occasional assistance, but is able to care for most of his personal needs. 50   - Requires considerable assistance and frequent medical care. 36   - Disabled; requires special care and assistance. 71   - Severely disabled; hospital admission is indicated although death not imminent. 49   - Very sick; hospital admission necessary; active supportive treatment necessary. 10   - Moribund; fatal processes progressing rapidly. 0     - Dead  Karnofsky DA, Abelmann [place], Craver LS and Burchenal [facility] [phone]) The use of the nitrogen mustards in the palliative treatment of carcinoma: with particular reference to bronchogenic carcinoma Cancer 1  [id number]   LABORATORY DATA:  Lab Results  Component Value Date   WBC 25.7 (H) 05/13/2012   HGB 12.6 (L) 05/13/2012   HCT 37.0 (L) 05/13/2012   MCV 89.8 05/13/2012   PLT 245 05/13/2012   Lab Results  Component Value Date   NA 131 (L) 05/13/2012   K 3.2 (L) 05/13/2012   CL 95 (L) 05/13/2012   CO2 25 05/13/2012   No results found for: ALT, AST, GGT, ALKPHOS, BILITOT   RADIOGRAPHY: Nm Bone Scan Whole Body  Result Date: 09/19/2016 CLINICAL DATA:  Prostate cancer EXAM: NUCLEAR MEDICINE WHOLE BODY BONE SCAN TECHNIQUE: Whole body anterior and posterior images were obtained approximately 3 hours after intravenous injection of radiopharmaceutical. RADIOPHARMACEUTICALS:  Twenty-one mCi Technetium-26mMDP IV COMPARISON:  08/04/2016 CT pelvis with contrast FINDINGS: Degenerative type foci of activity in the posterior left mid cervical spine, both shoulders, both knees, right ankle, and right first MTP joint in the foot. No definite abnormal osseous activity to suggest metastatic process to the bones. Normal background activity and renal excretion. IMPRESSION: No definite osseous metastatic process. Degenerative  type activity as above. Electronically Signed   By: Jerilynn Mages.  Shick M.D.   On: 09/19/2016 16:10      IMPRESSION/PLAN:  1. 65 y.o. gentleman with High Risk Stage T1c adenocarcinoma of the prostate with Gleason Score 4+5, and PSA of 8.1. Dr. Tammi Klippel reviews the findings from the patient's biopsy and reviewed the options for high risk prostate cancer include surgical resection with the possible need for radiotherapy if high risk features are seen on final pathology versus ADT for 2 years with 40 fractions over 8 weeks of external radiotherapy to the prostate. He discusses the delivery and logistics of radiotherapy as well as the risks, benefits, short, and long term effects of radiotherapy. At the end of the discussion, the patient is leaning towards radiotherapy with ADT. Dr. Tammi Klippel discusses that Dr.  Marica Otter could administer the ADT injections and placement of fiducial markers into the prostate. 2 months following administration of ADT, Dr. Tammi Klippel would see the patient in the [facility] clinic for CT simulation and subsequent treatment. At the end of the discussion the patient is pleased with the options and would like to move forward.  We will coordinate his care with our [facility] staff.     The above documentation reflects my direct findings during this shared patient visit. Please see the separate note by Dr. Tammi Klippel on this date for the remainder of the patient's plan of care.   Carola Rhine, PAC    This document serves as a record of services personally performed by Tyler Pita, MD and Shona Simpson, PA. It was created on his behalf by Maryla Morrow, a trained medical scribe. The creation of this record is based on the scribe's personal observations and the provider's statements to them. This document has been checked and approved by the attending provider.

## 2016-09-29 NOTE — Progress Notes (Signed)
Reason for Referral: Prostate cancer   HPI: 65 year old gentleman with history of COPD, diabetes and hypertension. He was found to have an elevated PSA of 7.4 with lower urinary tract symptoms. He was treated for prostatitis by his primary care physician in his PSA went up to 8.1. He was subsequently evaluated by Dr. [facility] urology and a biopsy obtained of his prostate in August 2017. The biopsy showed Gleason score 4+5 = 9 in multiple cores at the left apex, left mid, left base as well as the right apex right mid and right base. He does report symptoms of frequency and nocturia but no hematuria or dysuria. He does smoke heavily and have done so for many years. He has no other complaints at this time and currently medically disabled. He denied any headaches, blurry vision, syncope or seizures. He does not report any fevers or chills or sweats. He does not report any cough, wheezing or hemoptysis. He is not reporting nausea, vomiting or abdominal pain. He does not report any frequency or hematuria. He does not report any skeletal complaints. Remaining review of systems unremarkable.   Past Medical History:  Diagnosis Date  . Anxiety   . Aortic aneurysm (HCC)    4.4 will have intervention when 5.5  . COPD (chronic obstructive pulmonary disease) ([place])   . Diabetes mellitus without complication ([place])   . Hypertension   . Prostate cancer [facility])   :  Past Surgical History:  Procedure Laterality Date  . COLONOSCOPY N/A 11/23/2015   Procedure: COLONOSCOPY;  Surgeon: Aviva Signs, MD;  [facility];  Service: Gastroenterology;  Laterality: N/A;  . FRACTURE SURGERY    . PROSTATE BIOPSY    . SINUS SURGERY WITH INSTATRAK    :   Current Outpatient Prescriptions:  .  ALPRAZolam (XANAX) 1 MG tablet, Take 0.5 mg by mouth 2 (two) times daily., Disp: , Rfl:  .  citalopram (CELEXA) 40 MG tablet, Take 40 mg by mouth daily. , Disp: , Rfl:  .  cyclobenzaprine (FLEXERIL) 10 MG  tablet, Take 10 mg by mouth 3 (three) times daily as needed., Disp: , Rfl:  .  glipiZIDE (GLUCOTROL) 5 MG tablet, Take 5 mg by mouth daily before breakfast., Disp: , Rfl:  .  losartan (COZAAR) 100 MG tablet, Take 100 mg by mouth daily. Reported on 04/05/2016, Disp: , Rfl:  .  Omega-3 Fatty Acids (FISH OIL) 1000 MG CAPS, Take by mouth., Disp: , Rfl:  .  tamsulosin (FLOMAX) 0.4 MG CAPS capsule, , Disp: , Rfl:  .  Vitamin D, Ergocalciferol, (DRISDOL) 50000 units CAPS capsule, Take 50,000 Units by mouth every 7 (seven) days., Disp: , Rfl: :  Allergies  Allergen Reactions  . Sulfa Antibiotics Hives, Itching and Swelling    Tongue swelling  :  Family History  Problem Relation Age of Onset  . Heart disease Father     before age 11  . Cancer Neg Hx   :  Social History   Social History  . Marital status: Married    Spouse name: N/A  . Number of children: N/A  . Years of education: N/A   Occupational History  . Not on file.   Social History Main Topics  . Smoking status: Current Every Day Smoker    Packs/day: 2.00    Years: 44.00    Types: Cigarettes  . Smokeless tobacco: Never Used  . Alcohol use No  . Drug use: No  . Sexual activity: No  Other Topics Concern  . Not on file   Social History Narrative  . No narrative on file  :  Pertinent items are noted in HPI.  Exam: His vitals were reviewed and documented in the chart appeared within normal range.  General appearance: alert and cooperative. Without distress.  Head: Normocephalic, without obvious abnormality Back: negative Resp: clear to auscultation bilaterally Chest wall: no tenderness Cardio: regular rate and rhythm, S1, S2 normal, no murmur, click, rub or gallop GI: soft, non-tender; bowel sounds normal; no masses,  no organomegaly no rebound or guarding. Extremities: extremities normal, atraumatic, no cyanosis or edema Pulses: 2+ and symmetric Skin: Skin color, texture, turgor normal. No rashes or  lesions Lymph nodes: Cervical, supraclavicular, and axillary nodes normal.  CBC    Component Value Date/Time   WBC 25.7 (H) 05/13/2012 2006   RBC 4.12 (L) 05/13/2012 2006   HGB 12.6 (L) 05/13/2012 2006   HCT 37.0 (L) 05/13/2012 2006   PLT 245 05/13/2012 2006   MCV 89.8 05/13/2012 2006   MCH 30.6 05/13/2012 2006   MCHC 34.1 05/13/2012 2006   RDW 13.6 05/13/2012 2006   LYMPHSABS 1.3 05/13/2012 2006   MONOABS 2.1 (H) 05/13/2012 2006   EOSABS 0.0 05/13/2012 2006   BASOSABS 0.0 05/13/2012 2006     Chemistry      Component Value Date/Time   NA 131 (L) 05/13/2012 2006   K 3.2 (L) 05/13/2012 2006   CL 95 (L) 05/13/2012 2006   CO2 25 05/13/2012 2006   BUN 24 (H) 05/13/2012 2006   CREATININE 1.01 05/13/2012 2006      Component Value Date/Time   CALCIUM 9.7 05/13/2012 2006       Nm Bone Scan Whole Body  Result Date: 09/19/2016 CLINICAL DATA:  Prostate cancer EXAM: NUCLEAR MEDICINE WHOLE BODY BONE SCAN TECHNIQUE: Whole body anterior and posterior images were obtained approximately 3 hours after intravenous injection of radiopharmaceutical. RADIOPHARMACEUTICALS:  Twenty-one mCi Technetium-74mMDP IV COMPARISON:  08/04/2016 CT pelvis with contrast FINDINGS: Degenerative type foci of activity in the posterior left mid cervical spine, both shoulders, both knees, right ankle, and right first MTP joint in the foot. No definite abnormal osseous activity to suggest metastatic process to the bones. Normal background activity and renal excretion. IMPRESSION: No definite osseous metastatic process. Degenerative type activity as above. Electronically Signed   By: MJerilynn Mages  Shick M.D.   On: 09/19/2016 16:10    Assessment and Plan:   65year old gentleman with prostate cancer diagnosed in August 2017. His a Gleason score 4+5 = 9 and PSA of 8.1. His imaging studies including CT scan and a bone scan were discussed today with radiology and showed no evidence of advanced disease.  His case was discussed  today the prostate cancer multidisciplinary clinic and recommendations were given to the patient. The natural course of this disease was reviewed and he understands he has high-risk localized prostate cancer. He is not a surgical candidate and radiation therapy with androgen deprivation is his best option. The rationale for using androgen deprivation in this particular setting was discussed and include radiation between 18-36 months was also reviewed. Complications associated with androgen deprivation include hot flashes, weight gain, erectile dysfunction among others were discussed with the patient.  After discussion today he seems to be willing to proceed with this treatment in the near future. He'll have a discussion with Dr. MTammi Klippelat some point today discussing the logistics of radiation therapy closer to where he lives.

## 2016-09-29 NOTE — Addendum Note (Signed)
Encounter addended by: Heywood Footman, RN on: 09/29/2016  9:49 AM<BR>    Actions taken: Visit Navigator Flowsheet section accepted, Order Reconciliation Section accessed, Charge Capture section accepted

## 2016-09-29 NOTE — Progress Notes (Signed)
                               Care Plan Summary  Name: [name] DOB: [birth year]   Your Medical Team:   Urologist -  Dr. Raynelle Bring, [facility]  Radiation Oncologist - Dr. Tyler Pita, [facility]   Medical Oncologist - Dr. Zola Button, [place]  Recommendations: 1) Androgen Deprivation (hormone injection)  2) Radiation Therapy   * These recommendations are based on information available as of today's consult.      Recommendations may change depending on the results of further tests or exams  Next Steps: 1) Dr.Bauer's office will call you set schedule androgen deprivation (hormone injection) and place gold markers 2) Dr. Johny Shears office in [place] and radiation therapy  When appointments need to be scheduled, you will be contacted by [facility] and/or [facility].  Questions?  Please do not hesitate to call Cira Rue, RN, BSN, OCN at [phone] any questions or concerns.  Shirlean Mylar is your Oncology Nurse Navigator and is available to assist you while you're receiving your medical care at [facility].

## 2016-09-29 NOTE — Progress Notes (Signed)
START ON PATHWAY REGIMEN - Prostate  POS82: Radiation + Neoadjuvant/Concurrent/Adjuvant LHRH Agonist Starting 2 - 4 Months Prior to Radiation for a Total of 18 - 36 Months (+/- Nonsteroidal Antiandrogen During XRT)   A cycle is every 12 weeks:     Leuprolide acetate (Lupron(R)) 22.5 mg flat dose intramuscularly every 12 weeks Dose Mod: None   Daily:     Bicalutamide (Casodex(R)) 50 mg orally once a day Dose Mod: None  **Always confirm dose/schedule in your pharmacy ordering system**    Patient Characteristics: Adenocarcinoma, Clinically Localized Disease, High Risk, Radiation Candidate AJCC T Stage: 1c AJCC Stage Grouping: IIB Current radiographic evidence of distant metastasis? No PSA: < 10 Gleason Primary: 4 Gleason Secondary: 5 Gleason Score: 9 AJCC M Stage: 0 AJCC N Stage: 0 Risk Status: High Risk Treatment: Radiation Candidate  Intent of Therapy: Curative Intent, Discussed with Patient

## 2016-09-29 NOTE — Addendum Note (Signed)
Encounter addended by: Raynelle Bring, MD on: 09/29/2016 11:16 AM<BR>    Actions taken: Sign clinical note

## 2016-10-02 ENCOUNTER — Encounter: Payer: Self-pay | Admitting: General Practice

## 2016-10-02 NOTE — Progress Notes (Signed)
[facility] Psychosocial Distress Screening Spiritual Care  Reached Stephen Palmer by phone following Prostate Multidisciplinary Clinic to introduce [place] team/resources, reviewing distress screen per protocol.  The patient scored a 10 on the Psychosocial Distress Thermometer which indicates severe distress. Also assessed for distress and other psychosocial needs.   ONCBCN DISTRESS SCREENING 10/02/2016  Screening Type Initial Screening  Distress experienced in past week (1-10) 10  Practical problem type Housing;Insurance;Food  Family Problem type Other (comment)  Emotional problem type Depression;Nervousness/Anxiety;Feeling hopeless  Physical Problem type Pain;Getting around;Changes in urination;Tingling hands/feet;Sexual problems;Skin dry/itchy  Referral to support programs Yes   Stephen Palmer states that his high distress is unrelated to his cancer.  Pt states that he is "very impressed with the team" he met in [facility], which he describes as "friendly, informative, and helpful."  Per pt, "I feel a lot better about my cancer" since attending [facility].  He states that faith is a key coping tool, that he approaches problems like this in a matter-of-fact way, and that "My Father in [place] is my #1 supporter."    Follow up needed: No.  Pt is aware of ongoing [facility] [facility] team/programming availability in [place] (pt seeking treatment in [place]) and plans to reach out as desired.   [place], [place], [facility] Pager [phone] Voicemail [phone]

## 2016-10-03 DIAGNOSIS — C61 Malignant neoplasm of prostate: Secondary | ICD-10-CM | POA: Diagnosis not present

## 2016-10-05 ENCOUNTER — Encounter: Payer: Self-pay | Admitting: Medical Oncology

## 2016-10-05 DIAGNOSIS — C61 Malignant neoplasm of prostate: Secondary | ICD-10-CM | POA: Diagnosis not present

## 2016-10-10 ENCOUNTER — Telehealth: Payer: Self-pay | Admitting: *Deleted

## 2016-10-10 NOTE — Telephone Encounter (Signed)
Called patient to inform of appt. For [facility] appt. And sim for 12-05-16- arrival time - 9:30 am in [place], lvm for a return call

## 2016-10-17 DIAGNOSIS — C61 Malignant neoplasm of prostate: Secondary | ICD-10-CM | POA: Diagnosis not present

## 2016-12-05 DIAGNOSIS — Z79899 Other long term (current) drug therapy: Secondary | ICD-10-CM | POA: Diagnosis not present

## 2016-12-05 DIAGNOSIS — E119 Type 2 diabetes mellitus without complications: Secondary | ICD-10-CM | POA: Diagnosis not present

## 2016-12-05 DIAGNOSIS — R972 Elevated prostate specific antigen [PSA]: Secondary | ICD-10-CM | POA: Diagnosis not present

## 2016-12-05 DIAGNOSIS — Z882 Allergy status to sulfonamides status: Secondary | ICD-10-CM | POA: Diagnosis not present

## 2016-12-05 DIAGNOSIS — Z79818 Long term (current) use of other agents affecting estrogen receptors and estrogen levels: Secondary | ICD-10-CM | POA: Diagnosis not present

## 2016-12-05 DIAGNOSIS — Z7984 Long term (current) use of oral hypoglycemic drugs: Secondary | ICD-10-CM | POA: Diagnosis not present

## 2016-12-05 DIAGNOSIS — F419 Anxiety disorder, unspecified: Secondary | ICD-10-CM | POA: Diagnosis not present

## 2016-12-05 DIAGNOSIS — N400 Enlarged prostate without lower urinary tract symptoms: Secondary | ICD-10-CM | POA: Diagnosis not present

## 2016-12-05 DIAGNOSIS — J449 Chronic obstructive pulmonary disease, unspecified: Secondary | ICD-10-CM | POA: Diagnosis not present

## 2016-12-05 DIAGNOSIS — F17200 Nicotine dependence, unspecified, uncomplicated: Secondary | ICD-10-CM | POA: Diagnosis not present

## 2016-12-05 DIAGNOSIS — E785 Hyperlipidemia, unspecified: Secondary | ICD-10-CM | POA: Diagnosis not present

## 2016-12-05 DIAGNOSIS — I10 Essential (primary) hypertension: Secondary | ICD-10-CM | POA: Diagnosis not present

## 2016-12-05 DIAGNOSIS — C61 Malignant neoplasm of prostate: Secondary | ICD-10-CM | POA: Diagnosis not present

## 2016-12-05 DIAGNOSIS — Z510 Encounter for antineoplastic radiation therapy: Secondary | ICD-10-CM | POA: Diagnosis not present

## 2016-12-07 DIAGNOSIS — I10 Essential (primary) hypertension: Secondary | ICD-10-CM | POA: Diagnosis not present

## 2016-12-07 DIAGNOSIS — E785 Hyperlipidemia, unspecified: Secondary | ICD-10-CM | POA: Diagnosis not present

## 2016-12-07 DIAGNOSIS — E119 Type 2 diabetes mellitus without complications: Secondary | ICD-10-CM | POA: Diagnosis not present

## 2016-12-07 DIAGNOSIS — C61 Malignant neoplasm of prostate: Secondary | ICD-10-CM | POA: Diagnosis not present

## 2016-12-07 DIAGNOSIS — N400 Enlarged prostate without lower urinary tract symptoms: Secondary | ICD-10-CM | POA: Diagnosis not present

## 2016-12-07 DIAGNOSIS — Z510 Encounter for antineoplastic radiation therapy: Secondary | ICD-10-CM | POA: Diagnosis not present

## 2016-12-12 DIAGNOSIS — E785 Hyperlipidemia, unspecified: Secondary | ICD-10-CM | POA: Diagnosis not present

## 2016-12-12 DIAGNOSIS — E119 Type 2 diabetes mellitus without complications: Secondary | ICD-10-CM | POA: Diagnosis not present

## 2016-12-12 DIAGNOSIS — C61 Malignant neoplasm of prostate: Secondary | ICD-10-CM | POA: Diagnosis not present

## 2016-12-12 DIAGNOSIS — Z510 Encounter for antineoplastic radiation therapy: Secondary | ICD-10-CM | POA: Diagnosis not present

## 2016-12-12 DIAGNOSIS — N400 Enlarged prostate without lower urinary tract symptoms: Secondary | ICD-10-CM | POA: Diagnosis not present

## 2016-12-12 DIAGNOSIS — I10 Essential (primary) hypertension: Secondary | ICD-10-CM | POA: Diagnosis not present

## 2016-12-13 DIAGNOSIS — E119 Type 2 diabetes mellitus without complications: Secondary | ICD-10-CM | POA: Diagnosis not present

## 2016-12-13 DIAGNOSIS — C61 Malignant neoplasm of prostate: Secondary | ICD-10-CM | POA: Diagnosis not present

## 2016-12-13 DIAGNOSIS — Z510 Encounter for antineoplastic radiation therapy: Secondary | ICD-10-CM | POA: Diagnosis not present

## 2016-12-13 DIAGNOSIS — N400 Enlarged prostate without lower urinary tract symptoms: Secondary | ICD-10-CM | POA: Diagnosis not present

## 2016-12-13 DIAGNOSIS — I10 Essential (primary) hypertension: Secondary | ICD-10-CM | POA: Diagnosis not present

## 2016-12-13 DIAGNOSIS — E785 Hyperlipidemia, unspecified: Secondary | ICD-10-CM | POA: Diagnosis not present

## 2016-12-14 DIAGNOSIS — E785 Hyperlipidemia, unspecified: Secondary | ICD-10-CM | POA: Diagnosis not present

## 2016-12-14 DIAGNOSIS — I10 Essential (primary) hypertension: Secondary | ICD-10-CM | POA: Diagnosis not present

## 2016-12-14 DIAGNOSIS — N400 Enlarged prostate without lower urinary tract symptoms: Secondary | ICD-10-CM | POA: Diagnosis not present

## 2016-12-14 DIAGNOSIS — Z510 Encounter for antineoplastic radiation therapy: Secondary | ICD-10-CM | POA: Diagnosis not present

## 2016-12-14 DIAGNOSIS — E119 Type 2 diabetes mellitus without complications: Secondary | ICD-10-CM | POA: Diagnosis not present

## 2016-12-14 DIAGNOSIS — C61 Malignant neoplasm of prostate: Secondary | ICD-10-CM | POA: Diagnosis not present

## 2016-12-15 ENCOUNTER — Telehealth: Payer: Self-pay | Admitting: Medical Oncology

## 2016-12-15 ENCOUNTER — Encounter: Payer: Self-pay | Admitting: Medical Oncology

## 2016-12-15 DIAGNOSIS — N400 Enlarged prostate without lower urinary tract symptoms: Secondary | ICD-10-CM | POA: Diagnosis not present

## 2016-12-15 DIAGNOSIS — Z510 Encounter for antineoplastic radiation therapy: Secondary | ICD-10-CM | POA: Diagnosis not present

## 2016-12-15 DIAGNOSIS — E785 Hyperlipidemia, unspecified: Secondary | ICD-10-CM | POA: Diagnosis not present

## 2016-12-15 DIAGNOSIS — E119 Type 2 diabetes mellitus without complications: Secondary | ICD-10-CM | POA: Diagnosis not present

## 2016-12-15 DIAGNOSIS — I10 Essential (primary) hypertension: Secondary | ICD-10-CM | POA: Diagnosis not present

## 2016-12-15 DIAGNOSIS — C61 Malignant neoplasm of prostate: Secondary | ICD-10-CM | POA: Diagnosis not present

## 2016-12-15 NOTE — Progress Notes (Signed)
Lupron side effects- Started radiation 12/13/16.

## 2016-12-15 NOTE — Progress Notes (Signed)
Stephen Palmer is being treated in [facility] by Dr. Tammi Klippel

## 2016-12-19 DIAGNOSIS — Z510 Encounter for antineoplastic radiation therapy: Secondary | ICD-10-CM | POA: Diagnosis not present

## 2016-12-19 DIAGNOSIS — C61 Malignant neoplasm of prostate: Secondary | ICD-10-CM | POA: Diagnosis not present

## 2016-12-20 DIAGNOSIS — Z510 Encounter for antineoplastic radiation therapy: Secondary | ICD-10-CM | POA: Diagnosis not present

## 2016-12-20 DIAGNOSIS — C61 Malignant neoplasm of prostate: Secondary | ICD-10-CM | POA: Diagnosis not present

## 2016-12-21 DIAGNOSIS — C61 Malignant neoplasm of prostate: Secondary | ICD-10-CM | POA: Diagnosis not present

## 2016-12-21 DIAGNOSIS — Z510 Encounter for antineoplastic radiation therapy: Secondary | ICD-10-CM | POA: Diagnosis not present

## 2016-12-22 DIAGNOSIS — C61 Malignant neoplasm of prostate: Secondary | ICD-10-CM | POA: Diagnosis not present

## 2016-12-22 DIAGNOSIS — Z510 Encounter for antineoplastic radiation therapy: Secondary | ICD-10-CM | POA: Diagnosis not present

## 2016-12-25 DIAGNOSIS — C61 Malignant neoplasm of prostate: Secondary | ICD-10-CM | POA: Diagnosis not present

## 2016-12-25 DIAGNOSIS — Z510 Encounter for antineoplastic radiation therapy: Secondary | ICD-10-CM | POA: Diagnosis not present

## 2016-12-26 DIAGNOSIS — C61 Malignant neoplasm of prostate: Secondary | ICD-10-CM | POA: Diagnosis not present

## 2016-12-26 DIAGNOSIS — Z510 Encounter for antineoplastic radiation therapy: Secondary | ICD-10-CM | POA: Diagnosis not present

## 2016-12-27 DIAGNOSIS — Z510 Encounter for antineoplastic radiation therapy: Secondary | ICD-10-CM | POA: Diagnosis not present

## 2016-12-27 DIAGNOSIS — C61 Malignant neoplasm of prostate: Secondary | ICD-10-CM | POA: Diagnosis not present

## 2016-12-28 DIAGNOSIS — Z510 Encounter for antineoplastic radiation therapy: Secondary | ICD-10-CM | POA: Diagnosis not present

## 2016-12-28 DIAGNOSIS — C61 Malignant neoplasm of prostate: Secondary | ICD-10-CM | POA: Diagnosis not present

## 2016-12-29 DIAGNOSIS — C61 Malignant neoplasm of prostate: Secondary | ICD-10-CM | POA: Diagnosis not present

## 2016-12-29 DIAGNOSIS — Z510 Encounter for antineoplastic radiation therapy: Secondary | ICD-10-CM | POA: Diagnosis not present

## 2017-01-01 DIAGNOSIS — Z510 Encounter for antineoplastic radiation therapy: Secondary | ICD-10-CM | POA: Diagnosis not present

## 2017-01-01 DIAGNOSIS — C61 Malignant neoplasm of prostate: Secondary | ICD-10-CM | POA: Diagnosis not present

## 2017-01-02 DIAGNOSIS — Z510 Encounter for antineoplastic radiation therapy: Secondary | ICD-10-CM | POA: Diagnosis not present

## 2017-01-02 DIAGNOSIS — C61 Malignant neoplasm of prostate: Secondary | ICD-10-CM | POA: Diagnosis not present

## 2017-01-05 DIAGNOSIS — Z510 Encounter for antineoplastic radiation therapy: Secondary | ICD-10-CM | POA: Diagnosis not present

## 2017-01-05 DIAGNOSIS — C61 Malignant neoplasm of prostate: Secondary | ICD-10-CM | POA: Diagnosis not present

## 2017-01-08 DIAGNOSIS — Z1389 Encounter for screening for other disorder: Secondary | ICD-10-CM | POA: Diagnosis not present

## 2017-01-08 DIAGNOSIS — E1165 Type 2 diabetes mellitus with hyperglycemia: Secondary | ICD-10-CM | POA: Diagnosis not present

## 2017-01-08 DIAGNOSIS — F419 Anxiety disorder, unspecified: Secondary | ICD-10-CM | POA: Diagnosis not present

## 2017-01-08 DIAGNOSIS — F17200 Nicotine dependence, unspecified, uncomplicated: Secondary | ICD-10-CM | POA: Diagnosis not present

## 2017-01-08 DIAGNOSIS — I10 Essential (primary) hypertension: Secondary | ICD-10-CM | POA: Diagnosis not present

## 2017-01-08 DIAGNOSIS — C61 Malignant neoplasm of prostate: Secondary | ICD-10-CM | POA: Diagnosis not present

## 2017-01-08 DIAGNOSIS — Z6825 Body mass index (BMI) 25.0-25.9, adult: Secondary | ICD-10-CM | POA: Diagnosis not present

## 2017-01-08 DIAGNOSIS — Z510 Encounter for antineoplastic radiation therapy: Secondary | ICD-10-CM | POA: Diagnosis not present

## 2017-01-08 DIAGNOSIS — Z79899 Other long term (current) drug therapy: Secondary | ICD-10-CM | POA: Diagnosis not present

## 2017-01-08 DIAGNOSIS — E663 Overweight: Secondary | ICD-10-CM | POA: Diagnosis not present

## 2017-01-09 DIAGNOSIS — Z510 Encounter for antineoplastic radiation therapy: Secondary | ICD-10-CM | POA: Diagnosis not present

## 2017-01-09 DIAGNOSIS — C61 Malignant neoplasm of prostate: Secondary | ICD-10-CM | POA: Diagnosis not present

## 2017-01-10 DIAGNOSIS — C61 Malignant neoplasm of prostate: Secondary | ICD-10-CM | POA: Diagnosis not present

## 2017-01-10 DIAGNOSIS — Z510 Encounter for antineoplastic radiation therapy: Secondary | ICD-10-CM | POA: Diagnosis not present

## 2017-01-11 DIAGNOSIS — Z510 Encounter for antineoplastic radiation therapy: Secondary | ICD-10-CM | POA: Diagnosis not present

## 2017-01-11 DIAGNOSIS — C61 Malignant neoplasm of prostate: Secondary | ICD-10-CM | POA: Diagnosis not present

## 2017-01-12 DIAGNOSIS — C61 Malignant neoplasm of prostate: Secondary | ICD-10-CM | POA: Diagnosis not present

## 2017-01-12 DIAGNOSIS — Z510 Encounter for antineoplastic radiation therapy: Secondary | ICD-10-CM | POA: Diagnosis not present

## 2017-01-15 DIAGNOSIS — C61 Malignant neoplasm of prostate: Secondary | ICD-10-CM | POA: Diagnosis not present

## 2017-01-15 DIAGNOSIS — Z510 Encounter for antineoplastic radiation therapy: Secondary | ICD-10-CM | POA: Diagnosis not present

## 2017-01-16 DIAGNOSIS — Z510 Encounter for antineoplastic radiation therapy: Secondary | ICD-10-CM | POA: Diagnosis not present

## 2017-01-16 DIAGNOSIS — C61 Malignant neoplasm of prostate: Secondary | ICD-10-CM | POA: Diagnosis not present

## 2017-01-17 DIAGNOSIS — Z510 Encounter for antineoplastic radiation therapy: Secondary | ICD-10-CM | POA: Diagnosis not present

## 2017-01-17 DIAGNOSIS — C61 Malignant neoplasm of prostate: Secondary | ICD-10-CM | POA: Diagnosis not present

## 2017-01-18 DIAGNOSIS — Z510 Encounter for antineoplastic radiation therapy: Secondary | ICD-10-CM | POA: Diagnosis not present

## 2017-01-18 DIAGNOSIS — C61 Malignant neoplasm of prostate: Secondary | ICD-10-CM | POA: Diagnosis not present

## 2017-01-19 DIAGNOSIS — Z510 Encounter for antineoplastic radiation therapy: Secondary | ICD-10-CM | POA: Diagnosis not present

## 2017-01-19 DIAGNOSIS — C61 Malignant neoplasm of prostate: Secondary | ICD-10-CM | POA: Diagnosis not present

## 2017-01-22 DIAGNOSIS — C61 Malignant neoplasm of prostate: Secondary | ICD-10-CM | POA: Diagnosis not present

## 2017-01-22 DIAGNOSIS — Z510 Encounter for antineoplastic radiation therapy: Secondary | ICD-10-CM | POA: Diagnosis not present

## 2017-01-23 DIAGNOSIS — C61 Malignant neoplasm of prostate: Secondary | ICD-10-CM | POA: Diagnosis not present

## 2017-01-23 DIAGNOSIS — Z510 Encounter for antineoplastic radiation therapy: Secondary | ICD-10-CM | POA: Diagnosis not present

## 2017-01-24 DIAGNOSIS — C61 Malignant neoplasm of prostate: Secondary | ICD-10-CM | POA: Diagnosis not present

## 2017-01-24 DIAGNOSIS — Z510 Encounter for antineoplastic radiation therapy: Secondary | ICD-10-CM | POA: Diagnosis not present

## 2017-01-25 DIAGNOSIS — Z510 Encounter for antineoplastic radiation therapy: Secondary | ICD-10-CM | POA: Diagnosis not present

## 2017-01-25 DIAGNOSIS — C61 Malignant neoplasm of prostate: Secondary | ICD-10-CM | POA: Diagnosis not present

## 2017-01-26 DIAGNOSIS — Z510 Encounter for antineoplastic radiation therapy: Secondary | ICD-10-CM | POA: Diagnosis not present

## 2017-01-26 DIAGNOSIS — C61 Malignant neoplasm of prostate: Secondary | ICD-10-CM | POA: Diagnosis not present

## 2017-01-29 DIAGNOSIS — Z510 Encounter for antineoplastic radiation therapy: Secondary | ICD-10-CM | POA: Diagnosis not present

## 2017-01-29 DIAGNOSIS — C61 Malignant neoplasm of prostate: Secondary | ICD-10-CM | POA: Diagnosis not present

## 2017-01-30 DIAGNOSIS — C61 Malignant neoplasm of prostate: Secondary | ICD-10-CM | POA: Diagnosis not present

## 2017-01-30 DIAGNOSIS — Z510 Encounter for antineoplastic radiation therapy: Secondary | ICD-10-CM | POA: Diagnosis not present

## 2017-01-31 DIAGNOSIS — Z510 Encounter for antineoplastic radiation therapy: Secondary | ICD-10-CM | POA: Diagnosis not present

## 2017-01-31 DIAGNOSIS — C61 Malignant neoplasm of prostate: Secondary | ICD-10-CM | POA: Diagnosis not present

## 2017-02-01 DIAGNOSIS — Z510 Encounter for antineoplastic radiation therapy: Secondary | ICD-10-CM | POA: Diagnosis not present

## 2017-02-01 DIAGNOSIS — C61 Malignant neoplasm of prostate: Secondary | ICD-10-CM | POA: Diagnosis not present

## 2017-02-02 DIAGNOSIS — Z510 Encounter for antineoplastic radiation therapy: Secondary | ICD-10-CM | POA: Diagnosis not present

## 2017-02-02 DIAGNOSIS — C61 Malignant neoplasm of prostate: Secondary | ICD-10-CM | POA: Diagnosis not present

## 2017-02-05 DIAGNOSIS — Z510 Encounter for antineoplastic radiation therapy: Secondary | ICD-10-CM | POA: Diagnosis not present

## 2017-02-05 DIAGNOSIS — C61 Malignant neoplasm of prostate: Secondary | ICD-10-CM | POA: Diagnosis not present

## 2017-02-06 DIAGNOSIS — C61 Malignant neoplasm of prostate: Secondary | ICD-10-CM | POA: Diagnosis not present

## 2017-02-06 DIAGNOSIS — Z510 Encounter for antineoplastic radiation therapy: Secondary | ICD-10-CM | POA: Diagnosis not present

## 2017-02-07 DIAGNOSIS — C61 Malignant neoplasm of prostate: Secondary | ICD-10-CM | POA: Diagnosis not present

## 2017-02-07 DIAGNOSIS — Z510 Encounter for antineoplastic radiation therapy: Secondary | ICD-10-CM | POA: Diagnosis not present

## 2017-02-08 DIAGNOSIS — C61 Malignant neoplasm of prostate: Secondary | ICD-10-CM | POA: Diagnosis not present

## 2017-02-08 DIAGNOSIS — Z510 Encounter for antineoplastic radiation therapy: Secondary | ICD-10-CM | POA: Diagnosis not present

## 2017-02-09 DIAGNOSIS — Z510 Encounter for antineoplastic radiation therapy: Secondary | ICD-10-CM | POA: Diagnosis not present

## 2017-02-09 DIAGNOSIS — C61 Malignant neoplasm of prostate: Secondary | ICD-10-CM | POA: Diagnosis not present

## 2017-03-12 ENCOUNTER — Telehealth: Payer: Self-pay | Admitting: Medical Oncology

## 2017-03-12 NOTE — Progress Notes (Signed)
Spoke with staff in Dr. Fredrik Rigger former office regarding follow appointment for Stephen Palmer. He has be referred back to Dr. Alinda Money for office visit and Lupron on May 11 at 1:00 pm.

## 2017-03-12 NOTE — Progress Notes (Signed)
Informed Stephen Palmer of his follow up with Dr. Alinda Money May 11 at 1:30 pm. He has seen Dr. Alinda Money in [employer] but has been a while. I informed him that if he would like to continue his care in [place] since he lives in [place], he can discuss with Dr. Alinda Money at his visit in May. He voiced understanding.

## 2017-03-12 NOTE — Progress Notes (Signed)
[name] called stating that he is due for his Lupron in April but his urologist Dr. Exie Parody is no longer there. He is not sure where he will get follow up or with whom. I told him I will call Dr. Fredrik Rigger former office and see if they have him rescheduled. I will call him back with this information. He voiced understanding.

## 2017-03-14 DIAGNOSIS — Z923 Personal history of irradiation: Secondary | ICD-10-CM | POA: Diagnosis not present

## 2017-03-14 DIAGNOSIS — C61 Malignant neoplasm of prostate: Secondary | ICD-10-CM | POA: Diagnosis not present

## 2017-03-26 ENCOUNTER — Telehealth: Payer: Self-pay | Admitting: Medical Oncology

## 2017-03-26 NOTE — Progress Notes (Signed)
[name] called stating that he has an appointment with Dr. Alinda Money 03/28/17 but he is not sure of the time. He asked if I could clarify this for him. I check on this and call him back.

## 2017-03-26 NOTE — Progress Notes (Signed)
I spoke with Stephen Palmer to inform him his appointment with Dr. Alinda Money is May 11 at 1:15 pm. He thanked me for following up on this. He though his appointment was April 11 and he would have driven to [place] on the wrong day. I asked him to call me with any questions or concerns.

## 2017-03-26 NOTE — Progress Notes (Signed)
I called [facility] to confirm date and time of appointment with Dr. Alinda Money. His next appointment is May 11 at 1:15 pm.

## 2017-04-12 ENCOUNTER — Encounter: Payer: Self-pay | Admitting: Family

## 2017-04-18 ENCOUNTER — Other Ambulatory Visit (HOSPITAL_COMMUNITY): Payer: Medicare Other

## 2017-04-18 ENCOUNTER — Ambulatory Visit: Payer: Medicare Other | Admitting: Family

## 2017-04-25 ENCOUNTER — Ambulatory Visit (HOSPITAL_COMMUNITY)
Admission: RE | Admit: 2017-04-25 | Discharge: 2017-04-25 | Disposition: A | Discharge status: Home / Self Care | Payer: Medicare Other | Source: Ambulatory Visit | Attending: Family | Admitting: Family

## 2017-04-25 ENCOUNTER — Encounter: Payer: Self-pay | Admitting: Family

## 2017-04-25 ENCOUNTER — Ambulatory Visit (INDEPENDENT_AMBULATORY_CARE_PROVIDER_SITE_OTHER): Payer: Medicare Other | Admitting: Family

## 2017-04-25 VITALS — BP 115/81 | HR 79 | Temp 97.3°F | Resp 20 | Ht 70.0 in | Wt 171.0 lb

## 2017-04-25 DIAGNOSIS — I714 Abdominal aortic aneurysm, without rupture, unspecified: Secondary | ICD-10-CM

## 2017-04-25 DIAGNOSIS — F17200 Nicotine dependence, unspecified, uncomplicated: Secondary | ICD-10-CM

## 2017-04-25 NOTE — Progress Notes (Signed)
[facility] [place]   CC: Follow up Abdominal Aortic Aneurysm  History of Present Illness  Stephen Palmer is a 66 y.o. (June 23, 1951) male patient of Dr. Scot Dock initially referred by Dr. Gerarda Fraction for evaluation of an abdominal aortic aneurysm. He was having some vague abdominal pain. He had an ultrasound of the aorta at [facility] on 09/15/2015 which showed a 4.4 cm abdominal aortic aneurysm. Currently he is not having significant abdominal pain. He does have some chronic low back pain, states he has spondylosis. He sometimes has no pain in his left low back and hip with walking, has pain now in his left hip with sitting. He reports spondylosis in his back, and degenerative disease in many of his joints and low back.   He denies any history of rest pain or nonhealing ulcers.  The left hip pain will sometimes radiate laterally down left leg. He is unaware of any family history of abdominal aortic aneurysms although he studies state that his sister had 3 brain aneurysms.  He states his PCP is aware of his right side facial and neck pain.   He states he only has mid pubic abdominal pain when he has a BM, pt states this started after his colonoscopy; he denies any other abdominal pain.  He is receiving Lupron injections  for prostate cancer, he finished radiation tx.   Pt states he suffered stress as a child, states he has seen several mental health specialists and "they cannot help me".   The patient denies history of stroke or TIA symptoms.  Pt Diabetic: Yes, states last A1C was 6.8  Pt smoker: smoker  (2 ppd, states he rolls his own from pipe tobacco, started at age 78 yrs)   Past Medical History:  Diagnosis Date  . Anxiety   . Aortic aneurysm (HCC)    4.4 will have intervention when 5.5  . COPD (chronic obstructive pulmonary disease) ([place])   . Diabetes mellitus without complication ([place])   . Hypertension   . Prostate cancer [facility])    Past Surgical  History:  Procedure Laterality Date  . COLONOSCOPY N/A 11/23/2015   Procedure: COLONOSCOPY;  Surgeon: Aviva Signs, MD;  Location[facility];  Service: Gastroenterology;  Laterality: N/A;  . FRACTURE SURGERY    . PROSTATE BIOPSY    . SINUS SURGERY WITH INSTATRAK     Social History Social History   Social History  . Marital status: Married    Spouse name: N/A  . Number of children: N/A  . Years of education: N/A   Occupational History  . Not on file.   Social History Main Topics  . Smoking status: Current Every Day Smoker    Packs/day: 2.00    Years: 44.00    Types: Cigarettes  . Smokeless tobacco: Never Used  . Alcohol use No  . Drug use: No  . Sexual activity: No   Other Topics Concern  . Not on file   Social History Narrative  . No narrative on file   Family History Family History  Problem Relation Age of Onset  . Heart disease Father     before age 85  . Cancer Neg Hx     Current Outpatient Prescriptions on File Prior to Visit  Medication Sig Dispense Refill  . ALPRAZolam (XANAX) 1 MG tablet Take 0.5 mg by mouth 2 (two) times daily.    . citalopram (CELEXA) 40 MG tablet Take 40 mg by mouth daily.     Marland Kitchen  cyclobenzaprine (FLEXERIL) 10 MG tablet Take 10 mg by mouth 3 (three) times daily as needed.    Marland Kitchen glipiZIDE (GLUCOTROL) 5 MG tablet Take 5 mg by mouth daily before breakfast.    . Omega-3 Fatty Acids (FISH OIL) 1000 MG CAPS Take by mouth.    . tamsulosin (FLOMAX) 0.4 MG CAPS capsule      No current facility-administered medications on file prior to visit.    Allergies  Allergen Reactions  . Sulfa Antibiotics Hives, Itching and Swelling    Tongue swelling    ROS: See HPI for pertinent positives and negatives.  Physical Examination  Vitals:   04/25/17 0949  BP: 115/81  Pulse: 79  Resp: 20  Temp: 97.3 F (36.3 C)  TempSrc: Oral  SpO2: 95%  Weight: 171 lb (77.6 kg)  Height: '5\' 10"'$  (1.778 m)   Body mass index is 24.54 kg/m.  General: A&O x  3, WD male, strong odor of cigarettes.   Pulmonary: Sym exp, respirations are non labored, fair air movt, CTAB, no rales, rhonchi, or wheezing.  Cardiac: RRR, Nl S1, S2, no detected murmur.   Carotid Bruits Right Left   Negative Negative   Aorta is moderately palpable Radial pulses are 2+ palpable and =                          VASCULAR EXAM:                                                                                                                                           LE Pulses Right Left       FEMORAL  2+ palpable  3+ palpable        POPLITEAL  not palpable   not palpable       POSTERIOR TIBIAL  2+ palpable   2+ palpable        DORSALIS PEDIS      ANTERIOR TIBIAL 1+ palpable  1+ palpable     Gastrointestinal: soft, NTND, -G/R, - HSM, reducible small ventral hernia, - CVAT B.  Musculoskeletal: M/S 5/5 throughout, Extremities without ischemic changes.  Neurologic: CN 2-12 intact, Pain and light touch intact in extremities are intact except, Motor exam as listed above    Non-Invasive Vascular Imaging  AAA Duplex (04/25/2017)  Previous size: 4.10 cm (Date: 04-05-16)  Current size:  4.43 cm (Date: 04/25/17), Right CIA: 1.31 cm; Left CIA: 1.27 cm  ABI (Date: 04/05/2016)  R: 1.12 (no previous), DP: triphasic, PT: triphasic, TBI: 0.79  L: 1.08 (no previous), DP: triphasic, PT: triphasic, TBI: 0.85   Medical Decision Making  The patient is a 66 y.o. male who presents with asymptomatic mid to distal AAA with increase in size from 4.1 cm to 4.43 cm in a year. The patient was counseled re smoking cessation and given several free resources re  smoking cessation.   Based on this patient's exam and diagnostic studies, the patient will follow up in 6 months  with the following studies: AAA duplex.  Consideration for repair of AAA would be made when the size is 5.0 cm, growth > 1 cm/yr, and symptomatic status.  I emphasized the importance of maximal  medical management including strict control of blood pressure, blood glucose, and lipid levels, antiplatelet agents, obtaining regular exercise, and cessation of smoking.   The patient was given information about AAA including signs, symptoms, treatment, and how to minimize the risk of enlargement and rupture of aneurysms.    The patient was advised to call 911 should the patient experience sudden onset abdominal or back pain.   Thank you for allowing [place] to participate in this patient's care.  Clemon Chambers, RN, MSN, FNP-C [facility] [place] Office: [phone]  Clinic Physician: Donzetta Matters  04/25/2017, 10:11 AM

## 2017-04-25 NOTE — Patient Instructions (Signed)
Abdominal Aortic Aneurysm Blood pumps away from the heart through tubes (blood vessels) called arteries. Aneurysms are weak or damaged places in the wall of an artery. It bulges out like a balloon. An abdominal aortic aneurysm happens in the main artery of the body (aorta). It can burst or tear, causing bleeding inside the body. This is an emergency. It needs treatment right away. What are the causes? The exact cause is unknown. Things that could cause this problem include:  Fat and other substances building up in the lining of a tube.  Swelling of the walls of a blood vessel.  Certain tissue diseases.  Belly (abdominal) trauma.  An infection in the main artery of the body. What increases the risk? There are things that make it more likely for you to have an aneurysm. These include:  Being over the age of 66 years old.  Having high blood pressure (hypertension).  Being a male.  Being white.  Being very overweight (obese).  Having a family history of aneurysm.  Using tobacco products. What are the signs or symptoms? Symptoms depend on the size of the aneurysm and how fast it grows. There may not be symptoms. If symptoms occur, they can include:  Pain (belly, side, lower back, or groin).  Feeling full after eating a small amount of food.  Feeling sick to your stomach (nauseous), throwing up (vomiting), or both.  Feeling a lump in your belly that feels like it is beating (pulsating).  Feeling like you will pass out (faint). How is this treated?  Medicine to control blood pressure and pain.  Imaging tests to see if the aneurysm gets bigger.  Surgery. How is this prevented? To lessen your chance of getting this condition:  Stop smoking. Stop chewing tobacco.  Limit or avoid alcohol.  Keep your blood pressure, blood sugar, and cholesterol within normal limits.  Eat less salt.  Eat foods low in saturated fats and cholesterol. These are found in animal and whole  dairy products.  Eat more fiber. Fiber is found in whole grains, vegetables, and fruits.  Keep a healthy weight.  Stay active and exercise often. This information is not intended to replace advice given to you by your health care provider. Make sure you discuss any questions you have with your health care provider. Document Released: 03/31/2013 Document Revised: 05/11/2016 Document Reviewed: 01/03/2013 Elsevier Interactive Patient Education  2017 [employer].      Steps to Quit Smoking Smoking tobacco can be bad for your health. It can also affect almost every organ in your body. Smoking puts you and people around you at risk for many serious long-lasting (chronic) diseases. Quitting smoking is hard, but it is one of the best things that you can do for your health. It is never too late to quit. What are the benefits of quitting smoking? When you quit smoking, you lower your risk for getting serious diseases and conditions. They can include:  Lung cancer or lung disease.  Heart disease.  Stroke.  Heart attack.  Not being able to have children (infertility).  Weak bones (osteoporosis) and broken bones (fractures). If you have coughing, wheezing, and shortness of breath, those symptoms may get better when you quit. You may also get sick less often. If you are pregnant, quitting smoking can help to lower your chances of having a baby of low birth weight. What can I do to help me quit smoking? Talk with your doctor about what can help you quit smoking. Some  things you can do (strategies) include:  Quitting smoking totally, instead of slowly cutting back how much you smoke over a period of time.  Going to in-person counseling. You are more likely to quit if you go to many counseling sessions.  Using resources and support systems, such as:  Online chats with a Social worker.  Phone quitlines.  Printed Furniture conservator/restorer.  Support groups or group counseling.  Text messaging  programs.  Mobile phone apps or applications.  Taking medicines. Some of these medicines may have nicotine in them. If you are pregnant or breastfeeding, do not take any medicines to quit smoking unless your doctor says it is okay. Talk with your doctor about counseling or other things that can help you. Talk with your doctor about using more than one strategy at the same time, such as taking medicines while you are also going to in-person counseling. This can help make quitting easier. What things can I do to make it easier to quit? Quitting smoking might feel very hard at first, but there is a lot that you can do to make it easier. Take these steps:  Talk to your family and friends. Ask them to support and encourage you.  Call phone quitlines, reach out to support groups, or work with a Social worker.  Ask people who smoke to not smoke around you.  Avoid places that make you want (trigger) to smoke, such as:  Bars.  Parties.  Smoke-break areas at work.  Spend time with people who do not smoke.  Lower the stress in your life. Stress can make you want to smoke. Try these things to help your stress:  Getting regular exercise.  Deep-breathing exercises.  Yoga.  Meditating.  Doing a body scan. To do this, close your eyes, focus on one area of your body at a time from head to toe, and notice which parts of your body are tense. Try to relax the muscles in those areas.  Download or buy apps on your mobile phone or tablet that can help you stick to your quit plan. There are many free apps, such as QuitGuide from the [employer] Office manager for Disease Control and Prevention). You can find more support from smokefree.gov and other websites. This information is not intended to replace advice given to you by your health care provider. Make sure you discuss any questions you have with your health care provider. Document Released: 09/30/2009 Document Revised: 08/01/2016 Document Reviewed:  04/20/2015 Elsevier Interactive Patient Education  2017 [employer].

## 2017-04-27 DIAGNOSIS — C61 Malignant neoplasm of prostate: Secondary | ICD-10-CM | POA: Diagnosis not present

## 2017-05-04 NOTE — Addendum Note (Signed)
Addended by: Lianne Cure A on: 05/04/2017 04:02 PM   Modules accepted: Orders

## 2017-07-04 DIAGNOSIS — Z6825 Body mass index (BMI) 25.0-25.9, adult: Secondary | ICD-10-CM | POA: Diagnosis not present

## 2017-07-04 DIAGNOSIS — S90222A Contusion of left lesser toe(s) with damage to nail, initial encounter: Secondary | ICD-10-CM | POA: Diagnosis not present

## 2017-07-04 DIAGNOSIS — Z0000 Encounter for general adult medical examination without abnormal findings: Secondary | ICD-10-CM | POA: Diagnosis not present

## 2017-07-04 DIAGNOSIS — E663 Overweight: Secondary | ICD-10-CM | POA: Diagnosis not present

## 2017-07-27 DIAGNOSIS — S62652A Nondisplaced fracture of medial phalanx of right middle finger, initial encounter for closed fracture: Secondary | ICD-10-CM | POA: Diagnosis not present

## 2017-07-27 DIAGNOSIS — M25561 Pain in right knee: Secondary | ICD-10-CM | POA: Diagnosis not present

## 2017-07-27 DIAGNOSIS — C61 Malignant neoplasm of prostate: Secondary | ICD-10-CM | POA: Diagnosis not present

## 2017-07-27 DIAGNOSIS — R918 Other nonspecific abnormal finding of lung field: Secondary | ICD-10-CM | POA: Diagnosis not present

## 2017-07-27 DIAGNOSIS — E119 Type 2 diabetes mellitus without complications: Secondary | ICD-10-CM | POA: Diagnosis not present

## 2017-07-27 DIAGNOSIS — Z79899 Other long term (current) drug therapy: Secondary | ICD-10-CM | POA: Diagnosis not present

## 2017-07-27 DIAGNOSIS — M25562 Pain in left knee: Secondary | ICD-10-CM | POA: Diagnosis not present

## 2017-07-27 DIAGNOSIS — F17200 Nicotine dependence, unspecified, uncomplicated: Secondary | ICD-10-CM | POA: Diagnosis not present

## 2017-07-27 DIAGNOSIS — R7989 Other specified abnormal findings of blood chemistry: Secondary | ICD-10-CM | POA: Diagnosis not present

## 2017-07-27 DIAGNOSIS — C799 Secondary malignant neoplasm of unspecified site: Secondary | ICD-10-CM | POA: Diagnosis not present

## 2017-07-27 DIAGNOSIS — S62625A Displaced fracture of medial phalanx of left ring finger, initial encounter for closed fracture: Secondary | ICD-10-CM | POA: Diagnosis not present

## 2017-07-27 DIAGNOSIS — Z7984 Long term (current) use of oral hypoglycemic drugs: Secondary | ICD-10-CM | POA: Diagnosis not present

## 2017-08-02 DIAGNOSIS — K709 Alcoholic liver disease, unspecified: Secondary | ICD-10-CM | POA: Diagnosis not present

## 2017-08-02 DIAGNOSIS — C3412 Malignant neoplasm of upper lobe, left bronchus or lung: Secondary | ICD-10-CM | POA: Diagnosis not present

## 2017-08-02 DIAGNOSIS — C61 Malignant neoplasm of prostate: Secondary | ICD-10-CM | POA: Diagnosis not present

## 2017-08-02 DIAGNOSIS — T82898A Other specified complication of vascular prosthetic devices, implants and grafts, initial encounter: Secondary | ICD-10-CM | POA: Diagnosis not present

## 2017-08-02 DIAGNOSIS — T85898A Other specified complication of other internal prosthetic devices, implants and grafts, initial encounter: Secondary | ICD-10-CM | POA: Diagnosis not present

## 2017-08-03 ENCOUNTER — Other Ambulatory Visit (HOSPITAL_COMMUNITY): Payer: Self-pay | Admitting: Radiation Oncology

## 2017-08-03 DIAGNOSIS — C3412 Malignant neoplasm of upper lobe, left bronchus or lung: Secondary | ICD-10-CM

## 2017-08-14 ENCOUNTER — Ambulatory Visit (HOSPITAL_COMMUNITY)
Admission: RE | Admit: 2017-08-14 | Discharge: 2017-08-14 | Disposition: A | Discharge status: Home / Self Care | Payer: Medicare Other | Source: Ambulatory Visit | Attending: Radiation Oncology | Admitting: Radiation Oncology

## 2017-08-14 DIAGNOSIS — K760 Fatty (change of) liver, not elsewhere classified: Secondary | ICD-10-CM | POA: Diagnosis not present

## 2017-08-14 DIAGNOSIS — K5730 Diverticulosis of large intestine without perforation or abscess without bleeding: Secondary | ICD-10-CM | POA: Diagnosis not present

## 2017-08-14 DIAGNOSIS — J322 Chronic ethmoidal sinusitis: Secondary | ICD-10-CM | POA: Insufficient documentation

## 2017-08-14 DIAGNOSIS — R918 Other nonspecific abnormal finding of lung field: Secondary | ICD-10-CM | POA: Diagnosis not present

## 2017-08-14 DIAGNOSIS — I714 Abdominal aortic aneurysm, without rupture: Secondary | ICD-10-CM | POA: Diagnosis not present

## 2017-08-14 DIAGNOSIS — C3412 Malignant neoplasm of upper lobe, left bronchus or lung: Secondary | ICD-10-CM

## 2017-08-14 DIAGNOSIS — D110 Benign neoplasm of parotid gland: Secondary | ICD-10-CM | POA: Diagnosis not present

## 2017-08-14 LAB — GLUCOSE, CAPILLARY: Glucose-Capillary: 169 mg/dL — ABNORMAL HIGH (ref 65–99)

## 2017-08-14 MED ORDER — FLUDEOXYGLUCOSE F - 18 (FDG) INJECTION
8.2000 | Freq: Once | INTRAVENOUS | Status: AC | PRN
  Administered 2017-08-14: 8.2 via INTRAVENOUS

## 2017-08-21 DIAGNOSIS — C61 Malignant neoplasm of prostate: Secondary | ICD-10-CM | POA: Diagnosis not present

## 2017-08-21 DIAGNOSIS — C3412 Malignant neoplasm of upper lobe, left bronchus or lung: Secondary | ICD-10-CM | POA: Diagnosis not present

## 2017-08-24 DIAGNOSIS — R918 Other nonspecific abnormal finding of lung field: Secondary | ICD-10-CM | POA: Diagnosis not present

## 2017-08-24 DIAGNOSIS — C61 Malignant neoplasm of prostate: Secondary | ICD-10-CM | POA: Diagnosis not present

## 2017-08-24 DIAGNOSIS — C3412 Malignant neoplasm of upper lobe, left bronchus or lung: Secondary | ICD-10-CM | POA: Diagnosis not present

## 2017-08-24 DIAGNOSIS — R079 Chest pain, unspecified: Secondary | ICD-10-CM | POA: Diagnosis not present

## 2017-08-28 ENCOUNTER — Encounter: Payer: Self-pay | Admitting: *Deleted

## 2017-08-28 ENCOUNTER — Telehealth: Payer: Self-pay | Admitting: *Deleted

## 2017-08-28 DIAGNOSIS — R911 Solitary pulmonary nodule: Secondary | ICD-10-CM

## 2017-08-28 NOTE — Telephone Encounter (Signed)
Oncology Nurse Navigator Documentation  Oncology Nurse Navigator Flowsheets 08/28/2017  Navigator Location [facility]  Navigator Encounter Type Telephone;Other/I received a call from [facility]. They would like Stephen Palmer to be seen with T surgery.  I called Stephen Palmer and updated him on referral and that he would be getting a call from the thoracic office.  He was thankful for the help. I called [facility] and updated them on referral.  I will complete referral in EPIC.    Telephone Outgoing Call  Treatment Phase Pre-Tx/Tx Discussion  Barriers/Navigation Needs Coordination of Care;Education  Education Other  Interventions Referrals;Coordination of Care;Education  Referrals Other  Coordination of Care Appts  Education Method Verbal  Acuity Level 2  Acuity Level 2 Referrals such as genetics, survivorship;Educational needs;Assistance expediting appointments  Time Spent with Patient 30

## 2017-08-29 ENCOUNTER — Encounter: Payer: Self-pay | Admitting: *Deleted

## 2017-08-29 NOTE — Progress Notes (Signed)
Oncology Nurse Navigator Documentation  Oncology Nurse Navigator Flowsheets 08/29/2017  Navigator Location [facility]  Navigator Encounter Type Other/I called [facility] to check on referral. I left vm message with my anem and phone number to call.   Treatment Phase Pre-Tx/Tx Discussion  Barriers/Navigation Needs Coordination of Care  Interventions Coordination of Care  Coordination of Care Appts  Acuity Level 1  Time Spent with Patient 15

## 2017-08-30 ENCOUNTER — Encounter: Payer: Self-pay | Admitting: *Deleted

## 2017-08-30 NOTE — Progress Notes (Signed)
Oncology Nurse Navigator Documentation  Oncology Nurse Navigator Flowsheets 08/30/2017  Navigator Encounter Type Other/Patient's case was presented at TB today.  The consensus was for patient to be evaluated for possible resection. I called [facility] to update them on the plan of care.  I was unable to reach but did leave Rad Onc RN message.  I also follow up with Jenny Reichmann at [facility] on referral.  She states she will get him scheduled to see T surgery.   Treatment Phase Pre-Tx/Tx Discussion  Barriers/Navigation Needs Coordination of Care  Interventions Coordination of Care  Referrals Other  Acuity Level 1  Time Spent with Patient 15

## 2017-09-03 ENCOUNTER — Encounter: Payer: Self-pay | Admitting: *Deleted

## 2017-09-03 NOTE — Progress Notes (Signed)
Oncology Nurse Navigator Documentation  Oncology Nurse Navigator Flowsheets 09/03/2017  Navigator Encounter Type Other/recieved some records from [facility]. I faxed to [place] office. Dr. Darcey Nora will be seeing patient this week.   Treatment Phase Pre-Tx/Tx Discussion  Barriers/Navigation Needs Coordination of Care  Interventions Coordination of Care  Referrals Other  Acuity Level 1  Time Spent with Patient 15

## 2017-09-06 ENCOUNTER — Encounter: Payer: Self-pay | Admitting: Cardiothoracic Surgery

## 2017-09-06 ENCOUNTER — Institutional Professional Consult (permissible substitution) (INDEPENDENT_AMBULATORY_CARE_PROVIDER_SITE_OTHER): Payer: Medicare Other | Admitting: Cardiothoracic Surgery

## 2017-09-06 VITALS — BP 139/90 | HR 96 | Resp 18 | Ht 70.0 in | Wt 168.0 lb

## 2017-09-06 DIAGNOSIS — Z8546 Personal history of malignant neoplasm of prostate: Secondary | ICD-10-CM | POA: Diagnosis not present

## 2017-09-06 DIAGNOSIS — R911 Solitary pulmonary nodule: Secondary | ICD-10-CM

## 2017-09-06 NOTE — Progress Notes (Addendum)
[address]       [place] [address]             [phone]        B[place] Medical Record #[id number] Date of Birth: 01-19-1951  Referring: Audry Pili, MD Primary Care: Scherrie Bateman  Chief Complaint:    Chief Complaint  Patient presents with  . Lung Lesion    Surgical eval, PET Scan 08/14/17, HX of prostate cancer  Patient examined, CT-PET scan images personally reviewed and counseled with patient and wife   History of Present Illness:    66 year old male active smoker 2 packs per day presents for evaluation recently diagnosed non-small cell carcinoma of the left upper lobe measuring 3 cm. PET scan shows no regional or distant metastatic disease. There is a hypermetabolic site probably involving the right parotid gland, most likely not related to his lung cancer.  The patient actually smokes but has not had PFTs. The patient had a syncopal event with soft tissue injury of his extremities about 2 weeks ago but has not had a head CT scan.  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 He has sedentary lifestyle. He rarely walks unless he has to. Walking the patient in the office hallway 150 feet resulted in heart rate increased from 96 at rest to 112 after ambulation. Oxygen saturation stayed at 96%   Zubrod Score: At the time of surgery this patient's most appropriate activity status/level should be described as: []     0    Normal activity, no symptoms []     1    Restricted in physical strenuous activity  but ambulatory, able to do out light work [x]     2    Ambulatory and capable of self care, unable to do work activities, up and about                 more than 50%  Of the time                            []     3    Only limited self care, in bed greater than 50% of waking hours []     4    Completely disabled, no self care, confined to bed or chair []     5    Moribund  Past Medical History:  Diagnosis Date  . Anxiety   . Aortic aneurysm (HCC)    4.4 will have intervention when 5.5  . COPD (chronic obstructive pulmonary disease) ([place])   . Diabetes mellitus without complication ([place])   . Hypertension   . Prostate cancer [facility])     Past Surgical History:  Procedure Laterality Date  . COLONOSCOPY N/A 11/23/2015   Procedure: COLONOSCOPY;  Surgeon: Aviva Signs, MD;  Location[facility];  Service: Gastroenterology;  Laterality: N/A;  . FRACTURE SURGERY    . PROSTATE BIOPSY    . SINUS SURGERY WITH INSTATRAK      History  Smoking Status  . Current Every Day Smoker  .  Packs/day: 2.00  . Years: 44.00  . Types: Cigarettes  Smokeless Tobacco  . Never Used   History  Alcohol Use No    Social History   Social History  . Marital status: Married    Spouse name: N/A  . Number of children: N/A  . Years of education: N/A   Occupational History  . Not on file.   Social History Main Topics  . Smoking status: Current Every Day Smoker    Packs/day: 2.00    Years: 44.00    Types: Cigarettes  . Smokeless tobacco: Never Used  . Alcohol use No  . Drug use: No  . Sexual activity: No   Other Topics Concern  . Not on file   Social History Narrative  . No narrative on file    Allergies  Allergen Reactions  . Sulfa Antibiotics Hives, Itching and Swelling    Tongue swelling    Current Outpatient Prescriptions  Medication Sig Dispense Refill  . ALPRAZolam (XANAX) 1 MG tablet Take 0.5 mg by mouth 2 (two) times daily.    . citalopram (CELEXA) 40 MG tablet Take 40 mg by mouth  daily.     . cyclobenzaprine (FLEXERIL) 10 MG tablet Take 10 mg by mouth 3 (three) times daily as needed.    . DULoxetine (CYMBALTA) 30 MG capsule Take 30 mg by mouth daily.    Marland Kitchen glipiZIDE (GLUCOTROL) 5 MG tablet Take 5 mg by mouth daily before breakfast.    . leuprolide (LUPRON) 22.5 MG injection Inject 45 mg into the muscle every 6 (six) months.    . Omega-3 Fatty Acids (FISH OIL) 1000 MG CAPS Take by mouth.    . tamsulosin (FLOMAX) 0.4 MG CAPS capsule      No current facility-administered medications for this visit.      (Not in a hospital admission)  Family History  Problem Relation Age of Onset  . Heart disease Father        before age 73  . Cancer Neg Hx      Review of Systems:       Cardiac Review of Systems: Y or N  Chest Pain [  n  ]  Resting SOB [ n  ] Exertional SOB  [ y ]  Orthopnea [ n ]   Pedal Edema [   ]    Palpitations [n  ] Syncope  Stephen Palmer  ]   Presyncope [  y ]  General Review of Systems: [Y] = yes [  ]=no Constitional: recent weight change [  ]; anorexia [  ]; fatigue [  ]; nausea [  ]; night sweats [  ]; fever [  ]; or chills [  ]                                                               Dental: poor dentition[  ]; Last Dentist visit: Unknown  Eye : blurred vision [  ]; diplopia [   ]; vision changes [  ];  Amaurosis fugax[  ]; Resp: cough Stephen Palmer  ];  wheezing[ y ];  hemoptysis[  ]; shortness of breath[ y ]; paroxysmal nocturnal dyspnea[  ]; dyspnea on exertion[y  ]; or orthopnea[  ];  GI:  gallstones[  ], vomiting[  ];  dysphagia[  ]; melena[  ];  hematochezia [  ]; heartburn[  ];   Hx of  Colonoscopy[  ]; GU: kidney stones [  ]; hematuria[  ];   dysuria [  ];  nocturia[  ];  history of     obstruction [  ]; urinary frequency [y  ]prostate cancer             Skin: rash, swelling[  ];, hair loss[  ];  peripheral edema[  ];  or itching[  ]; Musculosketetal: myalgias[  ];  joint swelling[  ];  joint erythema[  ];  joint pain[ y ];  back pain[ y ];  Heme/Lymph:  bruising[y];  bleeding[  ];  anemia[  ];  Neuro: TIA[  ];  headaches[  ];  stroke[  ];  vertigo[  ];  seizures[  ];   paresthesias[  ];  difficulty walking[  ];  Psych:depression[y  ]; anxiety[  ];  Endocrine: diabetes[y  ];  thyroid dysfunction[  ];  Immunizations: Flu [  ]; Pneumococcal[  ];  Other:                      The patient states he was exposed to plutonium                       citrate in early 60s at a hospital in [place] for treatment of a shoulder injury   Physical Exam: BP 139/90   Pulse 96   Resp 18   Ht 5\' 10"  (1.778 m)   Wt 168 lb (76.2 kg)   SpO2 97% Comment: RA  BMI 24.11 kg/m      Physical Exam  General: Overweight pale middle-aged Caucasian male in poor health HEENT: Normocephalic pupils equal , dentition adequate Neck: Supple without JVD, adenopathy, or bruit Chest: Clear to auscultation but distant breath sounds, scattered coarse breath sounds, positive rhonchi, no tenderness             or deformity Cardiovascular: Regular rate and rhythm, no murmur, no gallop, peripheral pulses             palpable in all extremities Abdomen:  Soft, obese, nontender, no palpable mass or organomegaly Extremities: Warm, well-perfused, no clubbing cyanosis edema or tenderness,              no venous stasis changes of the legs Rectal/GU: Deferred Neuro: Grossly non--focal and symmetrical throughout Skin: Clean and dry without rash or ulceration     Diagnostic Studies & Laboratory data:     Recent Radiology Findings:   No results found.   I have independently reviewed the above radiologic studies.  Recent Lab Findings: Lab Results  Component Value Date   WBC 25.7 (H) 05/13/2012   HGB 12.6 (L) 05/13/2012   HCT 37.0 (L) 05/13/2012   PLT 245 05/13/2012   GLUCOSE 165 (H) 05/13/2012   NA 131 (L) 05/13/2012   K 3.2 (L) 05/13/2012   CL 95 (L) 05/13/2012   CREATININE 1.01 05/13/2012   BUN 24 (H) 05/13/2012   CO2 25 05/13/2012      Assessment / Plan:      Biopsy proven non-small cell carcinoma left upper lobe 3 cm in diameter without regional or distant metastatic disease by PET scan  Patient is a poor candidate for surgical resection as outlined at his original consultation last month. He has been referred for radiation therapy in the specialized format  stereotactic  body radiation. He needs a brain scan prior to therapy to complete clinical staging which I will order.  The patient return as needed for any thoracic surgical issues.

## 2017-09-07 ENCOUNTER — Encounter: Payer: Self-pay | Admitting: *Deleted

## 2017-09-07 NOTE — Progress Notes (Signed)
Oncology Nurse Navigator Documentation  Oncology Nurse Navigator Flowsheets 09/07/2017  Navigator Encounter Type Other/I received a notification from [facility] that patient needs to be seen with Dr. Tammi Klippel for SBRT.  I called [facility] to clarify.  I stated patient is being seen by Rad Onc at [facility]. I called [facility] nurse, [place].  I was unable to reach her and left vm message for her to call with an update.   Treatment Phase Pre-Tx/Tx Discussion  Barriers/Navigation Needs Coordination of Care  Interventions Coordination of Care  Referrals Other  Coordination of Care Other  Acuity Level 2  Time Spent with Patient 30

## 2017-09-10 ENCOUNTER — Encounter: Payer: Self-pay | Admitting: *Deleted

## 2017-09-10 DIAGNOSIS — R911 Solitary pulmonary nodule: Secondary | ICD-10-CM

## 2017-09-10 NOTE — Progress Notes (Signed)
Referral completed and rad onc called with an update to help expedite appt.

## 2017-09-10 NOTE — Progress Notes (Signed)
Oncology Nurse Navigator Documentation  Oncology Nurse Navigator Flowsheets 09/10/2017  Navigator Location [facility]  Navigator Encounter Type Other/I followed up on Stephen Palmer referral to [employer]. Appt has not been made yet.  I contacted rad onc scheduling for an update.   Treatment Phase Pre-Tx/Tx Discussion  Barriers/Navigation Needs Coordination of Care  Interventions Coordination of Care  Referrals Other  Coordination of Care Other  Acuity Level 1  Time Spent with Patient 30

## 2017-09-11 ENCOUNTER — Encounter: Payer: Self-pay | Admitting: Radiation Oncology

## 2017-09-19 ENCOUNTER — Ambulatory Visit (INDEPENDENT_AMBULATORY_CARE_PROVIDER_SITE_OTHER): Payer: Medicare Other | Admitting: Cardiothoracic Surgery

## 2017-09-19 ENCOUNTER — Other Ambulatory Visit: Payer: Self-pay | Admitting: *Deleted

## 2017-09-19 ENCOUNTER — Encounter: Payer: Self-pay | Admitting: Cardiothoracic Surgery

## 2017-09-19 VITALS — BP 140/94 | HR 96 | Ht 70.0 in | Wt 169.0 lb

## 2017-09-19 DIAGNOSIS — C3490 Malignant neoplasm of unspecified part of unspecified bronchus or lung: Secondary | ICD-10-CM

## 2017-09-19 DIAGNOSIS — C3412 Malignant neoplasm of upper lobe, left bronchus or lung: Secondary | ICD-10-CM

## 2017-09-19 DIAGNOSIS — C7931 Secondary malignant neoplasm of brain: Secondary | ICD-10-CM

## 2017-09-19 DIAGNOSIS — R918 Other nonspecific abnormal finding of lung field: Secondary | ICD-10-CM

## 2017-09-19 NOTE — Progress Notes (Addendum)
Thoracic Location of Tumor / Histology: non-small cell carcinoma left upper lobe   FUN to discuss primary radiation process and plan.  Patient presented two months ago with symptoms of: Generally poor health with history of COPD-emphysema with CT scan showing bilateral bullous disease, diabetes, peripheral vascular disease with a 4.5 cm AAA, history of prostate cancer treated with radiation.  Biopsies of  (if applicable) revealed: non-small cell carcinoma left upper lobe 3 cm in diameter without regional or distant metastatic disease by PET scan 08-24-17            IMPRESSION: 08-14-17 PET 1. Hypermetabolic 3.0 by 2.3 cm left upper lobe mass, maximum SUV 13.4, compatible with malignancy. No thoracic adenopathy identified. 2. A right parotid nodule measuring 1.0 by 0.7 cm has a maximum SUV of 7.3. Although potentially due to benign lesions such as Warthin's tumors (particularly in smokers), metastatic lymph nodes or malignant prostate tumors can occasionally cause this appearance. 3. Fusiform infrarenal abdominal aortic aneurysm 4.8 cm in diameter. Recommend followup by abdomen and pelvis CTA in 6 months, and vascular surgery referral/consultation if not already obtained. This recommendation follows ACR consensus guidelines: White Paper of the ACR Incidental Findings Committee II on Vascular Findings. J Am Coll Radiol 2013; 10:789-794. 4. Other imaging findings of potential clinical significance: Severe emphysema. Chronic ethmoid sinusitis. Diffuse hepatic steatosis. Sigmoid colon diverticulosis.  Tobacco/Marijuana/Snuff/ETOH use: Current smoker 2 PD x 40 years No drug or alcohol usage  Past/Anticipated interventions by cardiothoracic surgery, if any: Dr. Prescott Gum 08-14-17 CT-PET ,Patient is a poor candidate for surgical resection. I would recommend stereotactic body radiation if head CT scan is negative. The patient would have significant pulmonary, cardiac, risks with  surgery and would probably not benefit from major surgery.  Past/Anticipated interventions by medical oncology, if any: 09-29-16 Dr. Alen Blew  Signs/Symptoms Weight changes, if any:  Wt Readings from Last 3 Encounters:  09/21/17 166 lb 9.6 oz (75.6 kg)  09/19/17 169 lb (76.7 kg)  09/06/17 168 lb (76.2 kg)     Respiratory complaints, if any SOB with exertion, occasional wheezing,coughing up white secretion, COPD, Emphysema has limited his mobility because of knee pain from falling.  Hemoptysis, if any:No  Pain issues, if any:  Reports pain to both legs 10/10 walked from the parking lot today with his wife. Let if know he can use our parking service.  SAFETY ISSUES:  Prior radiation? :yes prostate 01-2017  Pacemaker/ICD? : No  Possible current pregnancy?:No  Is the patient on methotrexate? : No Denies urinary frequency or any urinary issue or concerns history of prostate cancer with  radiation ending 01-2017. The patient states he was exposed to plutonium citrate in early 96s at a hospital in [place] for treatment of a shoulder injury. Scheduled for a MRI w wo brain10-16-18 per Dr. Prescott Gum. BP 107/87   Pulse (!) 101   Temp 97.7 F (36.5 C) (Oral)   Resp 18   Ht 5\' 10"  (1.778 m)   Wt 166 lb 9.6 oz (75.6 kg)   SpO2 95%   BMI 23.90 kg/m

## 2017-09-19 NOTE — Addendum Note (Signed)
Addended by: Floyde Parkins MD, PETER on: 09/19/2017 03:47 PM   Modules accepted: Level of Service

## 2017-09-19 NOTE — Progress Notes (Signed)
PCP is [name], PA-C Referring Provider is Audry Pili, MD  Chief Complaint  Patient presents with  . Lung Mass    2 week follow up    HPI: Patient returns for further evaluation and review of progress He was turned down for surgical resection of a left upper lobe non-small cell carcinoma due to severe comorbid medical problems, COPD, alcohol abuse, active smoking, and peripheral vascular disease.  He has not yet had a brain MRI to complete clinical staging which I'll order. The patient otherwise has no systemic symptoms of his left upper lobe non-small cell carcinoma.  Past Medical History:  Diagnosis Date  . Anxiety   . Aortic aneurysm (HCC)    4.4 will have intervention when 5.5  . COPD (chronic obstructive pulmonary disease) ([place])   . Diabetes mellitus without complication ([place])   . Hypertension   . Prostate cancer [facility])     Past Surgical History:  Procedure Laterality Date  . COLONOSCOPY N/A 11/23/2015   Procedure: COLONOSCOPY;  Surgeon: Aviva Signs, MD;  [facility];  Service: Gastroenterology;  Laterality: N/A;  . FRACTURE SURGERY    . PROSTATE BIOPSY    . SINUS SURGERY WITH INSTATRAK      Family History  Problem Relation Age of Onset  . Heart disease Father        before age 17  . Cancer Neg Hx     Social History Social History  Substance Use Topics  . Smoking status: Current Every Day Smoker    Packs/day: 2.00    Years: 44.00    Types: Cigarettes  . Smokeless tobacco: Never Used  . Alcohol use No    Current Outpatient Prescriptions  Medication Sig Dispense Refill  . ALPRAZolam (XANAX) 1 MG tablet Take 0.5 mg by mouth 2 (two) times daily.    . cyclobenzaprine (FLEXERIL) 10 MG tablet Take 10 mg by mouth 3 (three) times daily as needed.    . DULoxetine (CYMBALTA) 30 MG capsule Take 30 mg by mouth daily.    Marland Kitchen glipiZIDE (GLUCOTROL) 5 MG tablet Take 5 mg by mouth daily before breakfast.    . leuprolide (LUPRON) 22.5 MG  injection Inject 45 mg into the muscle every 6 (six) months.    . tamsulosin (FLOMAX) 0.4 MG CAPS capsule      No current facility-administered medications for this visit.     Allergies  Allergen Reactions  . Sulfa Antibiotics Hives, Itching and Swelling    Tongue swelling    Review of Systems  No changes  BP (!) 140/94   Pulse 96   Ht 5\' 10"  (1.778 m)   Wt 169 lb (76.7 kg)   SpO2 99%   BMI 24.25 kg/m  Physical Exam      Exam    General- alert and comfortable   Lungs- clear without rales, wheezes   Cor- regular rate and rhythm, no murmur , gallop   Abdomen- soft, non-tender   Extremities - warm, non-tender, minimal edema   Neuro- oriented, appropriate, no focal weakness   Diagnostic Tests:  None Impression:  Clinical stage I left upper lobe non-small cell carcinoma Plan: Obtain brain MRI to complete clinical staging  Stereotactic body radiation therapy is the best therapeutic option for this patient. He has an appointment to be seen at radiation oncology clinic. He had previous radiation therapy in [facility] for his prostate cancer but for SBRT he will need to be evaluated here  In [place].  Len Childs, MD [facility] [phone]

## 2017-09-21 ENCOUNTER — Ambulatory Visit
Admission: RE | Admit: 2017-09-21 | Discharge: 2017-09-21 | Disposition: A | Discharge status: Home / Self Care | Payer: Medicare Other | Source: Ambulatory Visit | Attending: Radiation Oncology | Admitting: Radiation Oncology

## 2017-09-21 ENCOUNTER — Encounter: Payer: Self-pay | Admitting: Urology

## 2017-09-21 ENCOUNTER — Ambulatory Visit
Admission: RE | Admit: 2017-09-21 | Discharge: 2017-09-21 | Disposition: A | Discharge status: Home / Self Care | Payer: Medicare Other | Source: Ambulatory Visit | Attending: Urology | Admitting: Urology

## 2017-09-21 DIAGNOSIS — F419 Anxiety disorder, unspecified: Secondary | ICD-10-CM | POA: Diagnosis not present

## 2017-09-21 DIAGNOSIS — F17210 Nicotine dependence, cigarettes, uncomplicated: Secondary | ICD-10-CM | POA: Diagnosis not present

## 2017-09-21 DIAGNOSIS — C3412 Malignant neoplasm of upper lobe, left bronchus or lung: Secondary | ICD-10-CM | POA: Diagnosis not present

## 2017-09-21 DIAGNOSIS — I10 Essential (primary) hypertension: Secondary | ICD-10-CM | POA: Diagnosis not present

## 2017-09-21 DIAGNOSIS — Z7984 Long term (current) use of oral hypoglycemic drugs: Secondary | ICD-10-CM | POA: Insufficient documentation

## 2017-09-21 DIAGNOSIS — C61 Malignant neoplasm of prostate: Secondary | ICD-10-CM | POA: Diagnosis not present

## 2017-09-21 DIAGNOSIS — Z923 Personal history of irradiation: Secondary | ICD-10-CM | POA: Insufficient documentation

## 2017-09-21 DIAGNOSIS — E1151 Type 2 diabetes mellitus with diabetic peripheral angiopathy without gangrene: Secondary | ICD-10-CM | POA: Diagnosis not present

## 2017-09-21 DIAGNOSIS — Z79899 Other long term (current) drug therapy: Secondary | ICD-10-CM | POA: Insufficient documentation

## 2017-09-21 DIAGNOSIS — C3492 Malignant neoplasm of unspecified part of left bronchus or lung: Secondary | ICD-10-CM

## 2017-09-21 DIAGNOSIS — I714 Abdominal aortic aneurysm, without rupture: Secondary | ICD-10-CM | POA: Diagnosis not present

## 2017-09-21 DIAGNOSIS — Z8249 Family history of ischemic heart disease and other diseases of the circulatory system: Secondary | ICD-10-CM | POA: Diagnosis not present

## 2017-09-21 DIAGNOSIS — J439 Emphysema, unspecified: Secondary | ICD-10-CM | POA: Insufficient documentation

## 2017-09-21 DIAGNOSIS — Z8546 Personal history of malignant neoplasm of prostate: Secondary | ICD-10-CM | POA: Insufficient documentation

## 2017-09-21 DIAGNOSIS — Z510 Encounter for antineoplastic radiation therapy: Secondary | ICD-10-CM | POA: Insufficient documentation

## 2017-09-21 HISTORY — DX: Malignant neoplasm of unspecified part of left bronchus or lung: C34.92

## 2017-09-21 NOTE — Progress Notes (Signed)
Radiation Oncology         [phone] ________________________________  Initial outpatient Consultation  Name: Stephen Palmer MRN: [id number]  Date of Service: 09/21/2017 DOB: Apr 21, 1951  HW:[id number], Hulen Shouts, PA-C  [name], MD   REFERRING PHYSICIAN: Prescott Gum, Collier [place], MD  DIAGNOSIS: 66 yo man with a 3 cm squamous cell carcinoma of the left upper lobe of the lung - Stage IA    ICD-10-CM   1. Non-small cell carcinoma of left lung, stage 1 (HCC) C34.92     HISTORY OF PRESENT ILLNESS: Stephen Palmer is a 66 y.o. male seen at the request of Dr. Quitman Livings. He initially presented to [facility] in August after a fall at home.  He was incidentally found to have a 3 cm spiculated LUL mass without evidence of lymphadenopathy on imaging performed as part of his work up and evaluation in the ER.    A PET scan was performed on 08/14/2017 for further evaluation and this revealed a 3.0 x 2.3 cm left upper lobe mass with an SUV max of 13.4. He was noted to have severe emphysema but there was no hypermetabolic adenopathy in the chest. There were no hypermetabolic lymph nodes in the abdomen or pelvis and no focal hypermetabolic activity to suggest skeletal metastasis.  He has a known 4.8 cm AAA.  He underwent a CT-guided core needle biopsy of the left upper lobe mass on 08/24/2017 and final pathology confirmed squamous cell carcinoma.  He met with Dr. Prescott Gum in consultation on 09/06/2017 and was not felt to be an appropriate surgical candidate due to severe comorbid medical problems, COPD, alcohol abuse, active smoking, and peripheral vascular disease.  For further disease staging, a bone scan was performed on 09/19/2017 which was negative for osseous metastatic disease and he has an MRI of the brain pending, scheduled for 10/02/2017, for further evaluation of recent syncopal episodes.  He is referred today for further evaluation and discussion of the potential role of SBRT  in the management of his disease.  Of note, he has recently completed prostate IMRT under the care and direction of Dr. Lianne Cure in [place], [place], for treatment of a high risk Gleason 4+5 prostate adenocarcinoma with pretreatment PSA of 8.1. He continues in follow-up with Dr. Alinda Money and reports that his initial posttreatment PSA was 0.21.    PREVIOUS RADIATION THERAPY: Yes Prostate IMRT 12/13/16 - 02/09/17; 40 fractions to 78 Gy in combination with LT-ADT.  PAST MEDICAL HISTORY:  Past Medical History:  Diagnosis Date  . Anxiety   . Aortic aneurysm (HCC)    4.4 will have intervention when 5.5  . COPD (chronic obstructive pulmonary disease) ([place])   . Diabetes mellitus without complication ([place])   . Hypertension   . Prostate cancer ([place])       PAST SURGICAL HISTORY: Past Surgical History:  Procedure Laterality Date  . COLONOSCOPY N/A 11/23/2015   Procedure: COLONOSCOPY;  Surgeon: Aviva Signs, MD;  [facility];  Service: Gastroenterology;  Laterality: N/A;  . FRACTURE SURGERY    . PROSTATE BIOPSY    . SINUS SURGERY WITH INSTATRAK      FAMILY HISTORY:  Family History  Problem Relation Age of Onset  . Heart disease Father        before age 26  . Cancer Neg Hx     SOCIAL HISTORY:  Social History   Social History  . Marital status: Married    Spouse name: N/A  .  Number of children: N/A  . Years of education: N/A   Occupational History  . Not on file.   Social History Main Topics  . Smoking status: Current Every Day Smoker    Packs/day: 2.00    Years: 44.00    Types: Cigarettes  . Smokeless tobacco: Never Used  . Alcohol use No  . Drug use: No  . Sexual activity: No   Other Topics Concern  . Not on file   Social History Narrative  . No narrative on file    ALLERGIES: Sulfa antibiotics  MEDICATIONS:  Current Outpatient Prescriptions  Medication Sig Dispense Refill  . ALPRAZolam (XANAX) 1 MG tablet Take 0.5 mg by mouth 2 (two) times daily.      . DULoxetine (CYMBALTA) 30 MG capsule Take 30 mg by mouth daily.    Marland Kitchen glipiZIDE (GLUCOTROL) 5 MG tablet Take 5 mg by mouth daily before breakfast.    . leuprolide (LUPRON) 22.5 MG injection Inject 45 mg into the muscle every 6 (six) months.    . tamsulosin (FLOMAX) 0.4 MG CAPS capsule     . cyclobenzaprine (FLEXERIL) 10 MG tablet Take 10 mg by mouth 3 (three) times daily as needed.     No current facility-administered medications for this encounter.     REVIEW OF SYSTEMS:  On review of systems, the patient reports that he is doing well overall. He denies any chest pain, fevers, chills, night sweats, or unintended weight changes. He does have chronic shortness of breath on exertion and productive cough with clear to whitish sputum. He denies any bowel or bladder disturbances, and denies abdominal pain, nausea or vomiting. He has chronic pain throughout his cervical, thoracic and lumbar spine as well as in multiple joints including his shoulders, hips and knees. He denies any new musculoskeletal or joint aches or pains. He reports dizziness and imbalance as well as weakness in the lower extremities intermittently for the past 2-3 months, unchanged recently. He denies recent headaches, paresthesias, difficulty with speech,changes in visual or auditory acuity, tinnitus, tremors or seizure activity. A complete review of systems is obtained and is otherwise negative.   PHYSICAL EXAM:  Wt Readings from Last 3 Encounters:  09/21/17 166 lb 9.6 oz (75.6 kg)  09/19/17 169 lb (76.7 kg)  09/06/17 168 lb (76.2 kg)   Temp Readings from Last 3 Encounters:  09/21/17 97.7 F (36.5 C) (Oral)  04/25/17 97.3 F (36.3 C) (Oral)  04/05/16 98 F (36.7 C) (Oral)   BP Readings from Last 3 Encounters:  09/21/17 107/87  09/19/17 (!) 140/94  09/06/17 139/90   Pulse Readings from Last 3 Encounters:  09/21/17 (!) 101  09/19/17 96  09/06/17 96   Pain Assessment Pain Score: 10-Worst pain ever (Knees)/10  In  general this is a well appearing Caucasian male in no acute distress.  He is alert and oriented x4 and appropriate throughout the examination. HEENT reveals that the patient is normocephalic, atraumatic. EOMs are intact. PERRLA. Skin is intact without any evidence of gross lesions. Cardiovascular exam reveals a regular rate and rhythm, no clicks rubs or murmurs are auscultated. Chest is clear to auscultation bilaterally. Lymphatic assessment is performed and does not reveal any adenopathy in the cervical, supraclavicular, axillary, or inguinal chains. Abdomen has active bowel sounds in all quadrants and is intact. The abdomen is soft, non tender, non distended. Lower extremities are negative for pretibial pitting edema, deep calf tenderness, cyanosis or clubbing.  KPS = 80  100 - Normal;  no complaints; no evidence of disease. 90   - Able to carry on normal activity; minor signs or symptoms of disease. 80   - Normal activity with effort; some signs or symptoms of disease. 70   - Cares for self; unable to carry on normal activity or to do active work. 60   - Requires occasional assistance, but is able to care for most of his personal needs. 50   - Requires considerable assistance and frequent medical care. 33   - Disabled; requires special care and assistance. 25   - Severely disabled; hospital admission is indicated although death not imminent. 72   - Very sick; hospital admission necessary; active supportive treatment necessary. 10   - Moribund; fatal processes progressing rapidly. 0     - Dead  Karnofsky DA, Abelmann [place], Craver LS and [facility] [phone]) The use of the nitrogen mustards in the palliative treatment of carcinoma: with particular reference to bronchogenic carcinoma Cancer 1 634-56  LABORATORY DATA:  Lab Results  Component Value Date   WBC 25.7 (H) 05/13/2012   HGB 12.6 (L) 05/13/2012   HCT 37.0 (L) 05/13/2012   MCV 89.8 05/13/2012   PLT 245 05/13/2012   Lab Results    Component Value Date   NA 131 (L) 05/13/2012   K 3.2 (L) 05/13/2012   CL 95 (L) 05/13/2012   CO2 25 05/13/2012   No results found for: ALT, AST, GGT, ALKPHOS, BILITOT   RADIOGRAPHY: No results found.    IMPRESSION/PLAN: 1. 66 y.o. with newly diagnosed Stage I non-small cell, squamous cell carcinoma of the left upper lobe lung. Today, we talked to the patient and his wife about the findings and workup thus far. We discussed the natural history of squamous cell carcinoma of the lung and general treatment, highlighting the role of stereotactic body radiotherapy (SBRT) in the management. We discussed the available radiation techniques, and focused on the details of logistics and delivery. We reviewed the anticipated acute and late sequelae associated with radiation in this setting. The patient was encouraged to ask questions that were answered to his satisfaction.  At the conclusion of our discussion, the patient elects to proceed with SBRT to the left upper lung lesion. He is encouraged to proceed as scheduled with the brain MRI but due to low suspicion of disease in the brain based on the fact that there has been no evidence of additional disease in the chest or abdomen on recent imaging studies,we will also go ahead and move forward with scheduling his CT simulation on 09/27/2017 at 2 PM in anticipation of beginning treatments in the near future.  He is hoping to have treatment completed prior to the last week of October as he is planning to travel to [place], [place] to be with his daughter for a surgical procedure.    [name], PA-C    Tyler Pita, MD  [facility] Direct Dial: [phone]  Fax: [phone] [email or web address]  Skype  LinkedIn

## 2017-09-24 DIAGNOSIS — F419 Anxiety disorder, unspecified: Secondary | ICD-10-CM | POA: Diagnosis not present

## 2017-09-24 DIAGNOSIS — E119 Type 2 diabetes mellitus without complications: Secondary | ICD-10-CM | POA: Diagnosis not present

## 2017-09-24 DIAGNOSIS — Z1389 Encounter for screening for other disorder: Secondary | ICD-10-CM | POA: Diagnosis not present

## 2017-09-24 DIAGNOSIS — Z6824 Body mass index (BMI) 24.0-24.9, adult: Secondary | ICD-10-CM | POA: Diagnosis not present

## 2017-09-24 DIAGNOSIS — M25562 Pain in left knee: Secondary | ICD-10-CM | POA: Diagnosis not present

## 2017-09-27 ENCOUNTER — Ambulatory Visit
Admission: RE | Admit: 2017-09-27 | Discharge: 2017-09-27 | Disposition: A | Discharge status: Home / Self Care | Payer: Medicare Other | Source: Ambulatory Visit | Attending: Radiation Oncology | Admitting: Radiation Oncology

## 2017-09-27 DIAGNOSIS — F17210 Nicotine dependence, cigarettes, uncomplicated: Secondary | ICD-10-CM | POA: Diagnosis not present

## 2017-09-27 DIAGNOSIS — J439 Emphysema, unspecified: Secondary | ICD-10-CM | POA: Diagnosis not present

## 2017-09-27 DIAGNOSIS — Z923 Personal history of irradiation: Secondary | ICD-10-CM | POA: Diagnosis not present

## 2017-09-27 DIAGNOSIS — E1151 Type 2 diabetes mellitus with diabetic peripheral angiopathy without gangrene: Secondary | ICD-10-CM | POA: Diagnosis not present

## 2017-09-27 DIAGNOSIS — Z510 Encounter for antineoplastic radiation therapy: Secondary | ICD-10-CM | POA: Diagnosis not present

## 2017-09-27 DIAGNOSIS — C3492 Malignant neoplasm of unspecified part of left bronchus or lung: Secondary | ICD-10-CM

## 2017-09-27 DIAGNOSIS — C3412 Malignant neoplasm of upper lobe, left bronchus or lung: Secondary | ICD-10-CM | POA: Diagnosis not present

## 2017-09-27 NOTE — Progress Notes (Signed)
  Radiation Oncology         [phone] ________________________________  Name: Stephen Palmer MRN: [id number]  Date: 09/27/2017  DOB: 05-Mar-1951  STEREOTACTIC BODY RADIOTHERAPY SIMULATION AND TREATMENT PLANNING NOTE    ICD-10-CM   1. Non-small cell carcinoma of left lung, stage 1 (HCC) C34.92     DIAGNOSIS:  66 y.o. gentleman with 3 cm squamous cell carcinoma of the left upper lobe of the lung - Stage IA.   NARRATIVE:  The patient was brought to the [facility].  Identity was confirmed.  All relevant records and images related to the planned course of therapy were reviewed.  The patient freely provided informed written consent to proceed with treatment after reviewing the details related to the planned course of therapy. The consent form was witnessed and verified by the simulation staff.  Then, the patient was set-up in a stable reproducible  supine position for radiation therapy.  A BodyFix immobilization pillow was fabricated for reproducible positioning.  Then I personally applied the abdominal compression paddle to limit respiratory excursion.  4D respiratoy motion management CT images were obtained.  Surface markings were placed.  The CT images were loaded into the planning software.  Then, using Cine, MIP, and standard views, the internal target volume (ITV) and planning target volumes (PTV) were delinieated, and avoidance structures were contoured.  Treatment planning then occurred.  The radiation prescription was entered and confirmed.  A total of two complex treatment devices were fabricated in the form of the BodyFix immobilization pillow and a neck accuform cushion.  I have requested : 3D Simulation  I have requested a DVH of the following structures: Heart, Lungs, Esophagus, Chest Wall, Brachial Plexus, Major Blood Vessels, and targets.  SPECIAL TREATMENT PROCEDURE:  The planned course of therapy using radiation constitutes a special treatment procedure. Special  care is required in the management of this patient for the following reasons. This treatment constitutes a Special Treatment Procedure for the following reason: [ High dose per fraction requiring special monitoring for increased toxicities of treatment including daily imaging..  The special nature of the planned course of radiotherapy will require increased physician supervision and oversight to ensure patient's safety with optimal treatment outcomes.  RESPIRATORY MOTION MANAGEMENT SIMULATION:  In order to account for effect of respiratory motion on target structures and other organs in the planning and delivery of radiotherapy, this patient underwent respiratory motion management simulation.  To accomplish this, when the patient was brought to the CT simulation planning suite, 4D respiratoy motion management CT images were obtained.  The CT images were loaded into the planning software.  Then, using a variety of tools including Cine, MIP, and standard views, the target volume and planning target volumes (PTV) were delineated.  Avoidance structures were contoured.  Treatment planning then occurred.  Dose volume histograms were generated and reviewed for each of the requested structure.  The resulting plan was carefully reviewed and approved today.  PLAN:  The patient will receive 54 Gy in 3 fractions.  ________________________________  Sheral Apley Tammi Klippel, M.D.    This document serves as a record of services personally performed by Tyler Pita MD. It was created on his behalf by Delton Coombes, a trained medical scribe. The creation of this record is based on the scribe's personal observations and the provider's statements to them. This document has been checked and approved by the attending provider.

## 2017-10-01 DIAGNOSIS — C3412 Malignant neoplasm of upper lobe, left bronchus or lung: Secondary | ICD-10-CM | POA: Diagnosis not present

## 2017-10-01 DIAGNOSIS — F17210 Nicotine dependence, cigarettes, uncomplicated: Secondary | ICD-10-CM | POA: Diagnosis not present

## 2017-10-01 DIAGNOSIS — E1151 Type 2 diabetes mellitus with diabetic peripheral angiopathy without gangrene: Secondary | ICD-10-CM | POA: Diagnosis not present

## 2017-10-01 DIAGNOSIS — Z923 Personal history of irradiation: Secondary | ICD-10-CM | POA: Diagnosis not present

## 2017-10-01 DIAGNOSIS — Z510 Encounter for antineoplastic radiation therapy: Secondary | ICD-10-CM | POA: Diagnosis not present

## 2017-10-01 DIAGNOSIS — J439 Emphysema, unspecified: Secondary | ICD-10-CM | POA: Diagnosis not present

## 2017-10-02 ENCOUNTER — Ambulatory Visit
Admission: RE | Admit: 2017-10-02 | Discharge: 2017-10-02 | Disposition: A | Discharge status: Home / Self Care | Payer: Medicare Other | Source: Ambulatory Visit | Attending: Cardiothoracic Surgery | Admitting: Cardiothoracic Surgery

## 2017-10-02 DIAGNOSIS — C7931 Secondary malignant neoplasm of brain: Secondary | ICD-10-CM

## 2017-10-02 DIAGNOSIS — C3490 Malignant neoplasm of unspecified part of unspecified bronchus or lung: Secondary | ICD-10-CM

## 2017-10-02 DIAGNOSIS — I639 Cerebral infarction, unspecified: Secondary | ICD-10-CM | POA: Diagnosis not present

## 2017-10-02 DIAGNOSIS — R918 Other nonspecific abnormal finding of lung field: Secondary | ICD-10-CM

## 2017-10-02 MED ORDER — GADOBENATE DIMEGLUMINE 529 MG/ML IV SOLN
15.0000 mL | Freq: Once | INTRAVENOUS | Status: AC | PRN
  Administered 2017-10-02: 15 mL via INTRAVENOUS

## 2017-10-05 ENCOUNTER — Ambulatory Visit
Admission: RE | Admit: 2017-10-05 | Discharge: 2017-10-05 | Disposition: A | Discharge status: Home / Self Care | Payer: Medicare Other | Source: Ambulatory Visit | Attending: Radiation Oncology | Admitting: Radiation Oncology

## 2017-10-05 DIAGNOSIS — E1151 Type 2 diabetes mellitus with diabetic peripheral angiopathy without gangrene: Secondary | ICD-10-CM | POA: Diagnosis not present

## 2017-10-05 DIAGNOSIS — Z923 Personal history of irradiation: Secondary | ICD-10-CM | POA: Diagnosis not present

## 2017-10-05 DIAGNOSIS — Z510 Encounter for antineoplastic radiation therapy: Secondary | ICD-10-CM | POA: Diagnosis not present

## 2017-10-05 DIAGNOSIS — C3412 Malignant neoplasm of upper lobe, left bronchus or lung: Secondary | ICD-10-CM | POA: Diagnosis not present

## 2017-10-05 DIAGNOSIS — J439 Emphysema, unspecified: Secondary | ICD-10-CM | POA: Diagnosis not present

## 2017-10-05 DIAGNOSIS — F17210 Nicotine dependence, cigarettes, uncomplicated: Secondary | ICD-10-CM | POA: Diagnosis not present

## 2017-10-09 ENCOUNTER — Ambulatory Visit
Admission: RE | Admit: 2017-10-09 | Discharge: 2017-10-09 | Disposition: A | Discharge status: Home / Self Care | Payer: Medicare Other | Source: Ambulatory Visit | Attending: Radiation Oncology | Admitting: Radiation Oncology

## 2017-10-09 ENCOUNTER — Encounter: Payer: Self-pay | Admitting: Urology

## 2017-10-09 DIAGNOSIS — J439 Emphysema, unspecified: Secondary | ICD-10-CM | POA: Diagnosis not present

## 2017-10-09 DIAGNOSIS — Z923 Personal history of irradiation: Secondary | ICD-10-CM | POA: Diagnosis not present

## 2017-10-09 DIAGNOSIS — Z510 Encounter for antineoplastic radiation therapy: Secondary | ICD-10-CM | POA: Diagnosis not present

## 2017-10-09 DIAGNOSIS — C3412 Malignant neoplasm of upper lobe, left bronchus or lung: Secondary | ICD-10-CM | POA: Diagnosis not present

## 2017-10-09 DIAGNOSIS — F17210 Nicotine dependence, cigarettes, uncomplicated: Secondary | ICD-10-CM | POA: Diagnosis not present

## 2017-10-09 DIAGNOSIS — E1151 Type 2 diabetes mellitus with diabetic peripheral angiopathy without gangrene: Secondary | ICD-10-CM | POA: Diagnosis not present

## 2017-10-09 NOTE — Progress Notes (Signed)
I spoke with the patient today following his second SBRT treatment.  He reports that overall, he is tolerating the treatment very well and has noticed improved breathing. He denies any productive cough or hemoptysis. He denies fever, chills, nausea or vomiting. He requested to be seen today to inquire about some increased warmth and redness that he has noticed in bilateral feet following radiation treatments, both initial treatment as well as following today's treatment.  He reports a sensation of pins and needles in the bottom of his feet bilaterally. These symptoms seem to resolve spontaneously after several hours. He denies any calf pain or swelling.  He is questioning whether this has anything to do with his radiation treatments. I advised that this is unrelated however, this certainly could be related to his history of peripheral vascular disease and/or his diabetic neuropathy. I advised that he keep a close eye on his blood sugars to make sure they remain well controlled. He admits that he has not been taking his oral diabetic medications regularly as prescribed, occasionally for getting a dose.  If his sugars are not well controlled, he is advised to follow-up with his primary care provider. He has an appointment with his vein and vascular specialist coming up in November. I advised that he call to be seen sooner if the symptoms persist or progress.  He states his understanding and compliance. He is comfortable with the stated plan. He will complete his third and final SBRT treatment on Friday, 10/12/2017.  I will see him back in one month for follow-up. He knows to call with any questions or concerns in the interim.   [name], PA-C

## 2017-10-09 NOTE — Progress Notes (Signed)
Patient has completed 2 fractions of SBRT to his left lung.  He denies having shortness of breath and reports his cough seems to be better since starting radiation.  He does have white sputum.  He reports that he fell in June and has had knee pain since.  He is also having pain in both ankles and said he has pain radiating up to his groin on both side.  He also reports having swelling in his ankles.  He is taking hydrocodone/acetaminophen twice a day.  He also said his feet were very hot after treatment on Friday.  He put his bare feet on cold pavement and had severe pain afterwards.  He is in a wheelchair today.

## 2017-10-12 ENCOUNTER — Encounter: Payer: Self-pay | Admitting: Radiation Oncology

## 2017-10-12 ENCOUNTER — Ambulatory Visit
Admission: RE | Admit: 2017-10-12 | Discharge: 2017-10-12 | Disposition: A | Discharge status: Home / Self Care | Payer: Medicare Other | Source: Ambulatory Visit | Attending: Radiation Oncology | Admitting: Radiation Oncology

## 2017-10-12 DIAGNOSIS — Z510 Encounter for antineoplastic radiation therapy: Secondary | ICD-10-CM | POA: Diagnosis not present

## 2017-10-12 DIAGNOSIS — Z923 Personal history of irradiation: Secondary | ICD-10-CM | POA: Diagnosis not present

## 2017-10-12 DIAGNOSIS — F17210 Nicotine dependence, cigarettes, uncomplicated: Secondary | ICD-10-CM | POA: Diagnosis not present

## 2017-10-12 DIAGNOSIS — E1151 Type 2 diabetes mellitus with diabetic peripheral angiopathy without gangrene: Secondary | ICD-10-CM | POA: Diagnosis not present

## 2017-10-12 DIAGNOSIS — C3412 Malignant neoplasm of upper lobe, left bronchus or lung: Secondary | ICD-10-CM | POA: Diagnosis not present

## 2017-10-12 DIAGNOSIS — J439 Emphysema, unspecified: Secondary | ICD-10-CM | POA: Diagnosis not present

## 2017-10-17 NOTE — Progress Notes (Signed)
  Radiation Oncology         [phone] ________________________________  Name: Stephen Palmer MRN: [id number]  Date: 10/12/2017  DOB: 1951/08/02  End of Treatment Note  Diagnosis:   66 y.o. male with 3 cm squamous cell carcinoma of the left upper lobe of the lung - Stage IA     Indication for treatment:  Curative, Definitive SBRT       Radiation treatment dates:   10/05/2017, 10/09/2017, 10/12/2017  Site/dose:   The target was treated to 54 Gy in 3 fractions of 18 Gy.  Beams/energy:   The patient was treated using stereotactic body radiotherapy according to a 3D conformal radiotherapy plan.  Volumetric arc fields were employed to deliver 6 MV X-rays.  Image guidance was performed with per fraction cone beam CT prior to treatment under personal MD supervision.  Immobilization was achieved using BodyFix Pillow.  Narrative: The patient tolerated radiation treatment relatively well and has noticed improved breathing.   He reported a short-lasting productive cough with white sputum and occasional dizziness. He denied any hemoptysis, shortness of breath, fever, chills, nausea, or vomiting. He presented after his second treatment with concerns about increased warmth, redness, and tingling sensation in his bilateral feet following treatment. He was advised that this is unrelated to radiation but could be related to his history of peripheral vascular disease and/or diabetic neuropathy. He agreed to follow up with his PCP regarding these concerns.   Plan: The patient has completed radiation treatment. The patient will return to radiation oncology clinic for routine followup in one month. I advised them to call or return sooner if they have any questions or concerns related to their recovery or treatment. ________________________________  Sheral Apley. Tammi Klippel, M.D.  This document serves as a record of services personally performed by Tyler Pita, MD. It was created on his behalf by Rae Lips, a trained medical scribe. The creation of this record is based on the scribe's personal observations and the provider's statements to them. This document has been checked and approved by the attending provider.

## 2017-10-31 ENCOUNTER — Ambulatory Visit (INDEPENDENT_AMBULATORY_CARE_PROVIDER_SITE_OTHER): Payer: Medicare Other | Admitting: Urology

## 2017-10-31 DIAGNOSIS — R351 Nocturia: Secondary | ICD-10-CM | POA: Diagnosis not present

## 2017-10-31 DIAGNOSIS — C61 Malignant neoplasm of prostate: Secondary | ICD-10-CM | POA: Diagnosis not present

## 2017-11-14 ENCOUNTER — Ambulatory Visit (INDEPENDENT_AMBULATORY_CARE_PROVIDER_SITE_OTHER): Payer: Medicare Other | Admitting: Family

## 2017-11-14 ENCOUNTER — Ambulatory Visit (HOSPITAL_COMMUNITY)
Admission: RE | Admit: 2017-11-14 | Discharge: 2017-11-14 | Disposition: A | Discharge status: Home / Self Care | Payer: Medicare Other | Source: Ambulatory Visit | Attending: Family | Admitting: Family

## 2017-11-14 ENCOUNTER — Encounter: Payer: Self-pay | Admitting: Family

## 2017-11-14 ENCOUNTER — Ambulatory Visit: Payer: Self-pay | Admitting: Urology

## 2017-11-14 VITALS — BP 143/104 | HR 104 | Temp 98.3°F | Resp 18 | Wt 169.9 lb

## 2017-11-14 DIAGNOSIS — F17200 Nicotine dependence, unspecified, uncomplicated: Secondary | ICD-10-CM

## 2017-11-14 DIAGNOSIS — I714 Abdominal aortic aneurysm, without rupture, unspecified: Secondary | ICD-10-CM

## 2017-11-14 NOTE — Patient Instructions (Addendum)
Steps to Quit Smoking Smoking tobacco can be bad for your health. It can also affect almost every organ in your body. Smoking puts you and people around you at risk for many serious long-lasting (chronic) diseases. Quitting smoking is hard, but it is one of the best things that you can do for your health. It is never too late to quit. What are the benefits of quitting smoking? When you quit smoking, you lower your risk for getting serious diseases and conditions. They can include:  Lung cancer or lung disease.  Heart disease.  Stroke.  Heart attack.  Not being able to have children (infertility).  Weak bones (osteoporosis) and broken bones (fractures).  If you have coughing, wheezing, and shortness of breath, those symptoms may get better when you quit. You may also get sick less often. If you are pregnant, quitting smoking can help to lower your chances of having a baby of low birth weight. What can I do to help me quit smoking? Talk with your doctor about what can help you quit smoking. Some things you can do (strategies) include:  Quitting smoking totally, instead of slowly cutting back how much you smoke over a period of time.  Going to in-person counseling. You are more likely to quit if you go to many counseling sessions.  Using resources and support systems, such as: ? Online chats with a counselor. ? Phone quitlines. ? Printed self-help materials. ? Support groups or group counseling. ? Text messaging programs. ? Mobile phone apps or applications.  Taking medicines. Some of these medicines may have nicotine in them. If you are pregnant or breastfeeding, do not take any medicines to quit smoking unless your doctor says it is okay. Talk with your doctor about counseling or other things that can help you.  Talk with your doctor about using more than one strategy at the same time, such as taking medicines while you are also going to in-person counseling. This can help make  quitting easier. What things can I do to make it easier to quit? Quitting smoking might feel very hard at first, but there is a lot that you can do to make it easier. Take these steps:  Talk to your family and friends. Ask them to support and encourage you.  Call phone quitlines, reach out to support groups, or work with a counselor.  Ask people who smoke to not smoke around you.  Avoid places that make you want (trigger) to smoke, such as: ? Bars. ? Parties. ? Smoke-break areas at work.  Spend time with people who do not smoke.  Lower the stress in your life. Stress can make you want to smoke. Try these things to help your stress: ? Getting regular exercise. ? Deep-breathing exercises. ? Yoga. ? Meditating. ? Doing a body scan. To do this, close your eyes, focus on one area of your body at a time from head to toe, and notice which parts of your body are tense. Try to relax the muscles in those areas.  Download or buy apps on your mobile phone or tablet that can help you stick to your quit plan. There are many free apps, such as QuitGuide from the CDC (Centers for Disease Control and Prevention). You can find more support from smokefree.gov and other websites.  This information is not intended to replace advice given to you by your health care provider. Make sure you discuss any questions you have with your health care provider. Document Released: 09/30/2009 Document   Revised: 08/01/2016 Document Reviewed: 04/20/2015 Elsevier Interactive Patient Education  2018 [place].    Abdominal Aortic Aneurysm Blood pumps away from the heart through tubes (blood vessels) called arteries. Aneurysms are weak or damaged places in the wall of an artery. It bulges out like a balloon. An abdominal aortic aneurysm happens in the main artery of the body (aorta). It can burst or tear, causing bleeding inside the body. This is an emergency. It needs treatment right away. What are the causes? The  exact cause is unknown. Things that could cause this problem include:  Fat and other substances building up in the lining of a tube.  Swelling of the walls of a blood vessel.  Certain tissue diseases.  Belly (abdominal) trauma.  An infection in the main artery of the body.  What increases the risk? There are things that make it more likely for you to have an aneurysm. These include:  Being over the age of 66 years old.  Having high blood pressure (hypertension).  Being a male.  Being white.  Being very overweight (obese).  Having a family history of aneurysm.  Using tobacco products.  What are the signs or symptoms? Symptoms depend on the size of the aneurysm and how fast it grows. There may not be symptoms. If symptoms occur, they can include:  Pain (belly, side, lower back, or groin).  Feeling full after eating a small amount of food.  Feeling sick to your stomach (nauseous), throwing up (vomiting), or both.  Feeling a lump in your belly that feels like it is beating (pulsating).  Feeling like you will pass out (faint).  How is this treated?  Medicine to control blood pressure and pain.  Imaging tests to see if the aneurysm gets bigger.  Surgery. How is this prevented? To lessen your chance of getting this condition:  Stop smoking. Stop chewing tobacco.  Limit or avoid alcohol.  Keep your blood pressure, blood sugar, and cholesterol within normal limits.  Eat less salt.  Eat foods low in saturated fats and cholesterol. These are found in animal and whole dairy products.  Eat more fiber. Fiber is found in whole grains, vegetables, and fruits.  Keep a healthy weight.  Stay active and exercise often.  This information is not intended to replace advice given to you by your health care provider. Make sure you discuss any questions you have with your health care provider. Document Released: 03/31/2013 Document Revised: 05/11/2016 Document Reviewed:  01/03/2013 Elsevier Interactive Patient Education  2017 [employer].

## 2017-11-14 NOTE — Progress Notes (Signed)
[facility] [place]   CC: Follow up Abdominal Aortic Aneurysm  History of Present Illness  Stephen Palmer is a 66 y.o. (Apr 26, 1951) male initially referred to Dr. Scot Dock by Dr. Gerarda Fraction for evaluation of an abdominal aortic aneurysm. He was having some vague abdominal pain. He had an ultrasound of the aorta at [facility] on 09/15/2015 which showed a 4.4 cm abdominal aortic aneurysm. Currently he is not having significant abdominal pain. He does have some chronic low back pain. He sometimes has no pain in his left low back and hip with walking, has pain now in his left hip with sitting.  He reports spondylosis in his back, and degenerative disease in many of his joints.   He denies any history of rest pain or nonhealing ulcers.  The left hip pain will sometimes radiate laterally down left leg. He is unaware of any family history of abdominal aortic aneurysms although he states that his sister had 3 brain aneurysms.  He is receiving Lupron injections  for prostate cancer, he finished radiation tx.  He was diagnosed with lung cancer about June 2018.  He states he has C1 vertebral issues which is causing pain, tingling, and tremor in his right UE.   Pt states he suffered stress as a child, states he has seen several mental health specialists and "they cannot help me".   He had an event in June 2018, legs buckled, he had a syncopal episode. Dr. Nils Pyle ordered an MRI of the brain performed in October 2018: IMPRESSION: 1. No evidence of intracranial metastases or acute abnormality. 2. Chronic left parieto-occipital infarct. 3. Brainstem greater than cerebral white matter T2 hyperintensities, nonspecific though most often seen with chronic small vessel Ischemia.   Pt Diabetic: Yes, pt states his blood sugars are over 200, no A1C result on file Pt smoker: smoker (2 ppd, states he rolls his own from pipe tobacco, started at age 66 yrs) He states " I've got to  have it, my life expectancy is not long".    Past Medical History:  Diagnosis Date  . Anxiety   . Aortic aneurysm (HCC)    4.4 will have intervention when 5.5  . COPD (chronic obstructive pulmonary disease) ([place])   . Diabetes mellitus without complication ([place])   . Hypertension   . Prostate cancer [facility])    Past Surgical History:  Procedure Laterality Date  . COLONOSCOPY N/A 11/23/2015   Procedure: COLONOSCOPY;  Surgeon: Aviva Signs, MD;  Location: [facility];  Service: Gastroenterology;  Laterality: N/A;  . FRACTURE SURGERY    . PROSTATE BIOPSY    . SINUS SURGERY WITH INSTATRAK     Social History Social History   Socioeconomic History  . Marital status: Married    Spouse name: Not on file  . Number of children: Not on file  . Years of education: Not on file  . Highest education level: Not on file  Social Needs  . Financial resource strain: Not on file  . Food insecurity - worry: Not on file  . Food insecurity - inability: Not on file  . Transportation needs - medical: Not on file  . Transportation needs - non-medical: Not on file  Occupational History  . Not on file  Tobacco Use  . Smoking status: Current Every Day Smoker    Packs/day: 2.00    Years: 44.00    Pack years: 88.00    Types: Cigarettes  . Smokeless tobacco: Never Used  Substance and Sexual Activity  . Alcohol use: No    Alcohol/week: 0.0 oz  . Drug use: No  . Sexual activity: No  Other Topics Concern  . Not on file  Social History Narrative  . Not on file   Family History Family History  Problem Relation Age of Onset  . Heart disease Father        before age 37  . Heart disease Mother   . Cancer Neg Hx     Current Outpatient Medications on File Prior to Visit  Medication Sig Dispense Refill  . ALPRAZolam (XANAX) 1 MG tablet Take 0.5 mg by mouth 2 (two) times daily.    . cyclobenzaprine (FLEXERIL) 10 MG tablet Take 10 mg by mouth 3 (three) times daily as needed.    . DULoxetine  (CYMBALTA) 30 MG capsule Take 30 mg by mouth daily.    Marland Kitchen glipiZIDE (GLUCOTROL) 5 MG tablet Take 5 mg by mouth daily before breakfast.    . leuprolide (LUPRON) 22.5 MG injection Inject 45 mg into the muscle every 6 (six) months.    . tamsulosin (FLOMAX) 0.4 MG CAPS capsule      No current facility-administered medications on file prior to visit.    Allergies  Allergen Reactions  . Sulfa Antibiotics Hives, Itching and Swelling    Tongue swelling    ROS: See HPI for pertinent positives and negatives.  Physical Examination  Vitals:   11/14/17 0939 11/14/17 0941  BP: (!) 146/98 (!) 143/104  Pulse: (!) 104   Resp: 18   Temp: 98.3 F (36.8 C)   TempSrc: Oral   SpO2: 97%   Weight: 169 lb 14.4 oz (77.1 kg)    Body mass index is 24.38 kg/m.  General: A&O x 3, WD male, strong odor of cigarettes.   Pulmonary: Sym exp, respirations are non labored, fair air movement in all fields, no rales, rhonchi, or wheezing.  Cardiac: Regular rhythm, no detected murmur.   Carotid Bruits Right Left   Negative Negative   Abdominal aortic pulse is moderately palpable Radial pulses are 2+ palpable and =   VASCULAR EXAM:  LE Pulses Right Left  FEMORAL 2+ palpable 3+ palpable   POPLITEAL not palpable  not palpable  POSTERIOR TIBIAL 2+ palpable  2+ palpable   DORSALIS PEDIS ANTERIOR TIBIAL 1+ palpable  1+ palpable     Gastrointestinal: soft, NTND, -G/R, - HSM, reducible moderate sized ventral hernia, - CVAT B.  Skin: No rash, no cellulitis, no ulcers noted.   Musculoskeletal: M/S 5/5 throughout, Extremities without ischemic changes.  Neurologic: CN 2-12 intact, Pain and light touch intact in extremities are intact except, Motor exam as listed above     DATA  AAA Duplex (11/14/2017):  Previous size: 4.43 cm (Date: 04/25/17), Right CIA: 1.31 cm; Left CIA: 1.27 cm   Current size:  4.7 cm (Date:  11/14/17), Right CIA: 1.1 cm; Left CIA: 0.7 cm   ABI (Date: 04/05/2016):  R: 1.12 (no previous), DP: triphasic, PT: triphasic, TBI: 0.79  L: 1.08 (no previous), DP: triphasic, PT: triphasic, TBI: 0.85  Normal ABI's.    Medical Decision Making  The patient is a 66 y.o. male who presents with asymptomatic AAA with slightincrease in size, to 4.7 cm today.  The patient was counseled re smoking cessation and given several free resources re smoking cessation.   Based on this patient's exam and diagnostic studies, the patient will follow up in 6 months  with the following studies: AAA  duplex.  Consideration for repair of AAA would be made when the size is 5.5 cm, growth > 1 cm/yr, and symptomatic status.        The patient was given information about AAA including signs, symptoms, treatment, and how to minimize the risk of enlargement and rupture of aneurysms.    I emphasized the importance of maximal medical management including strict control of blood pressure, blood glucose, and lipid levels, antiplatelet agents, obtaining regular exercise, and cessation of smoking.   The patient was advised to call 911 should the patient experience sudden onset abdominal or back pain.   Thank you for allowing [place] to participate in this patient's care.  Clemon Chambers, RN, MSN, FNP-C [facility] [place] Office: [phone]  Clinic Physician: Scot Dock  11/14/2017, 9:58 AM

## 2017-11-15 NOTE — Addendum Note (Signed)
Addended by: Lianne Cure A on: 11/15/2017 02:11 PM   Modules accepted: Orders

## 2017-11-21 ENCOUNTER — Ambulatory Visit
Admission: RE | Admit: 2017-11-21 | Discharge: 2017-11-21 | Disposition: A | Discharge status: Home / Self Care | Payer: Medicare Other | Source: Ambulatory Visit | Attending: Urology | Admitting: Urology

## 2017-11-21 ENCOUNTER — Other Ambulatory Visit: Payer: Self-pay

## 2017-11-21 ENCOUNTER — Encounter: Payer: Self-pay | Admitting: Urology

## 2017-11-21 VITALS — BP 144/93 | HR 94 | Temp 97.8°F | Resp 18 | Ht 70.0 in | Wt 167.2 lb

## 2017-11-21 DIAGNOSIS — Z923 Personal history of irradiation: Secondary | ICD-10-CM | POA: Diagnosis not present

## 2017-11-21 DIAGNOSIS — J439 Emphysema, unspecified: Secondary | ICD-10-CM | POA: Diagnosis not present

## 2017-11-21 DIAGNOSIS — F17210 Nicotine dependence, cigarettes, uncomplicated: Secondary | ICD-10-CM | POA: Diagnosis not present

## 2017-11-21 DIAGNOSIS — Z510 Encounter for antineoplastic radiation therapy: Secondary | ICD-10-CM | POA: Diagnosis not present

## 2017-11-21 DIAGNOSIS — C3412 Malignant neoplasm of upper lobe, left bronchus or lung: Secondary | ICD-10-CM | POA: Diagnosis not present

## 2017-11-21 DIAGNOSIS — E1151 Type 2 diabetes mellitus with diabetic peripheral angiopathy without gangrene: Secondary | ICD-10-CM | POA: Diagnosis not present

## 2017-11-21 DIAGNOSIS — C3492 Malignant neoplasm of unspecified part of left bronchus or lung: Secondary | ICD-10-CM

## 2017-11-21 NOTE — Addendum Note (Signed)
Encounter addended by: Malena Edman, RN on: 11/21/2017 4:17 PM  Actions taken: Charge Capture section accepted

## 2017-11-21 NOTE — Progress Notes (Signed)
Radiation Oncology         ([phone]) [phone] ________________________________  Name: Stephen Palmer MRN: [id number]  Date: 11/21/2017  DOB: 1951-04-16  Post Treatment Note  CC: Stephen Samples, PA-C  [name], MD  Diagnosis:    66 y.o. male with 3 cm squamous cell carcinoma of the left upper lobe of the lung - Stage IA     Interval Since Last Radiation:  5.5 weeks  Curative, Definitive SBRT:  10/05/2017, 10/09/2017, 10/12/2017- The target was treated to 54 Gy in 3 fractions of 18 Gy.  12/13/16 - 02/09/17: Prostate IMRT ; 40 fractions to 78 Gy in combination with LT-ADT under the care of Dr. Lianne Cure in [place], [place].  Narrative:  The patient returns today for routine follow-up.  He tolerated radiation treatment relatively well and noticed improved breathing. He reported a short-lasting productive cough with white sputum and occasional dizziness. He denied any hemoptysis, shortness of breath, fever, chills, nausea, or vomiting. He presented after his second treatment with concerns about increased warmth, redness, and tingling sensation in his bilateral feet following treatment. He was advised that this is unrelated to radiation but could be related to his history of peripheral vascular disease and/or diabetic neuropathy. He agreed to follow up with his PCP regarding these concerns.                              On review of systems, the patient states that he is doing well overall. He continues with a mild productive cough with clear to whitish sputum. He denies hemoptysis, increased shortness of breath, chest pain, fevers, chills or night sweats.  He continues with fatigue but feels this is gradually improving with time.  He denies abdominal pain, N/V or diarrhea.  He has a healthy appetite and is maintaining his weight.  ALLERGIES:  is allergic to sulfa antibiotics.  Meds: Current Outpatient Medications  Medication Sig Dispense Refill  . ALPRAZolam (XANAX) 1 MG tablet Take 0.5 mg by mouth 2  (two) times daily.    . cyclobenzaprine (FLEXERIL) 10 MG tablet Take 10 mg by mouth 3 (three) times daily as needed.    Marland Kitchen glipiZIDE (GLUCOTROL) 5 MG tablet Take 5 mg by mouth daily before breakfast.    . leuprolide (LUPRON) 22.5 MG injection Inject 45 mg into the muscle every 6 (six) months.    . tamsulosin (FLOMAX) 0.4 MG CAPS capsule     . DULoxetine (CYMBALTA) 30 MG capsule Take 30 mg by mouth daily.     No current facility-administered medications for this encounter.     Physical Findings:  height is 5\' 10"  (1.778 m) and weight is 167 lb 3.2 oz (75.8 kg). His oral temperature is 97.8 F (36.6 C). His blood pressure is 144/93 (abnormal) and his pulse is 94. His respiration is 18 and oxygen saturation is 97%.  Pain Assessment Pain Score: 0-No pain/10 In general this is a well appearing Caucasian male in no acute distress. He's alert and oriented x4 and appropriate throughout the examination. Cardiopulmonary assessment is negative for acute distress and he exhibits normal effort.   Lab Findings: Lab Results  Component Value Date   WBC 25.7 (H) 05/13/2012   HGB 12.6 (L) 05/13/2012   HCT 37.0 (L) 05/13/2012   MCV 89.8 05/13/2012   PLT 245 05/13/2012     Radiographic Findings: No results found.  Impression/Plan: 1.  66 y.o. male with 3 cm  squamous cell carcinoma of the left upper lobe of the lung - Stage IA.    The patient appears to be doing well following radiotherapy. We will proceed with a CT scan of the chest with contrast and BMP to make sure that he can receive contrast before he undergoes a CT scan in the next week. He prefers to have his imaging performed at [facility] since this is closer to home in [place], [place].  I'll follow up with him by phone with these results, and provided that this scan is stable, we will move forward with serial imaging at three-six month intervals until 5 years when, at that point in time, he will have an annual low dose CT scan for surveillance. He  will return in 3 months to review the findings from the scan to be ordered prior to that visit, provided that the scan ordered today is stable.  He knows to call with any questions or concerns in the interim.  He is comfortable with this plan.    Stephen Johns, PA-C

## 2017-11-28 ENCOUNTER — Telehealth: Payer: Self-pay | Admitting: *Deleted

## 2017-11-28 ENCOUNTER — Other Ambulatory Visit: Payer: Self-pay | Admitting: Urology

## 2017-11-28 DIAGNOSIS — C3492 Malignant neoplasm of unspecified part of left bronchus or lung: Secondary | ICD-10-CM

## 2017-11-28 NOTE — Telephone Encounter (Signed)
Called patient to infom of CT for 11-29-17- arrival time- 12:45 pm, pt. to be npo- 4 hrs. prior to test, test to be @ [facility] in [place], spoke with patient and he is aware of this test

## 2017-11-29 ENCOUNTER — Other Ambulatory Visit: Payer: Self-pay | Admitting: Urology

## 2017-11-29 ENCOUNTER — Other Ambulatory Visit: Payer: Self-pay | Admitting: Radiation Oncology

## 2017-11-29 DIAGNOSIS — C3492 Malignant neoplasm of unspecified part of left bronchus or lung: Secondary | ICD-10-CM

## 2017-11-29 DIAGNOSIS — C3412 Malignant neoplasm of upper lobe, left bronchus or lung: Secondary | ICD-10-CM | POA: Diagnosis not present

## 2017-12-12 ENCOUNTER — Telehealth: Payer: Self-pay | Admitting: Radiation Oncology

## 2017-12-12 DIAGNOSIS — C3492 Malignant neoplasm of unspecified part of left bronchus or lung: Secondary | ICD-10-CM

## 2017-12-12 NOTE — Telephone Encounter (Signed)
I LM for pt reviewing his recent CT scan and I placed orders for his next scan which is due in 6 months. I encouraged him to call back if he'd like to discuss his scan further.

## 2017-12-28 ENCOUNTER — Telehealth: Payer: Self-pay | Admitting: Radiation Oncology

## 2017-12-28 NOTE — Telephone Encounter (Signed)
Received message from triage nurse that patient is requesting a return call from [employer], PA-C. Phoned patient to inquire about needs. Patient reports I believe someone called me to talk about my scan but I hung up on them. Explained to patient there is a note in the system where Shona Simpson, PA-c phoned to review CT results on 12/12/17. Patient requesting Bryson Ha phone him back to review this scan in further detail.

## 2018-01-07 ENCOUNTER — Telehealth: Payer: Self-pay | Admitting: Radiation Oncology

## 2018-01-07 NOTE — Telephone Encounter (Signed)
LM for pt again to review his scan from December. He had called while I was out of the office wanting to review this. He is due for repeat scan in June and is scheduled with Ashlyn to see and review.

## 2018-02-15 LAB — HEMOGLOBIN A1C: HEMOGLOBIN A1C: 10.1

## 2018-02-28 DIAGNOSIS — E119 Type 2 diabetes mellitus without complications: Secondary | ICD-10-CM | POA: Diagnosis not present

## 2018-02-28 DIAGNOSIS — E782 Mixed hyperlipidemia: Secondary | ICD-10-CM | POA: Diagnosis not present

## 2018-02-28 DIAGNOSIS — E1165 Type 2 diabetes mellitus with hyperglycemia: Secondary | ICD-10-CM | POA: Diagnosis not present

## 2018-02-28 DIAGNOSIS — I10 Essential (primary) hypertension: Secondary | ICD-10-CM | POA: Diagnosis not present

## 2018-02-28 DIAGNOSIS — Z1389 Encounter for screening for other disorder: Secondary | ICD-10-CM | POA: Diagnosis not present

## 2018-02-28 DIAGNOSIS — Z6825 Body mass index (BMI) 25.0-25.9, adult: Secondary | ICD-10-CM | POA: Diagnosis not present

## 2018-02-28 DIAGNOSIS — E663 Overweight: Secondary | ICD-10-CM | POA: Diagnosis not present

## 2018-04-23 DIAGNOSIS — C61 Malignant neoplasm of prostate: Secondary | ICD-10-CM | POA: Diagnosis not present

## 2018-05-01 ENCOUNTER — Ambulatory Visit (INDEPENDENT_AMBULATORY_CARE_PROVIDER_SITE_OTHER): Payer: Medicare Other | Admitting: Urology

## 2018-05-01 DIAGNOSIS — R351 Nocturia: Secondary | ICD-10-CM

## 2018-05-01 DIAGNOSIS — C61 Malignant neoplasm of prostate: Secondary | ICD-10-CM

## 2018-05-10 DIAGNOSIS — Z6825 Body mass index (BMI) 25.0-25.9, adult: Secondary | ICD-10-CM | POA: Diagnosis not present

## 2018-05-10 DIAGNOSIS — E663 Overweight: Secondary | ICD-10-CM | POA: Diagnosis not present

## 2018-05-10 DIAGNOSIS — E118 Type 2 diabetes mellitus with unspecified complications: Secondary | ICD-10-CM | POA: Diagnosis not present

## 2018-05-10 DIAGNOSIS — F329 Major depressive disorder, single episode, unspecified: Secondary | ICD-10-CM | POA: Diagnosis not present

## 2018-05-13 IMAGING — NM NM BONE WHOLE BODY
2 series · 2 of 2 positions shown · non-contrast
Comparison: 08/04/2016 CT pelvis with contrast

CLINICAL DATA: Prostate cancer

EXAM:
NUCLEAR MEDICINE WHOLE BODY BONE SCAN
TECHNIQUE: Whole body anterior and posterior images were obtained approximately
3 hours after intravenous injection of radiopharmaceutical.
RADIOPHARMACEUTICALS:  Twenty-one mCi Uechnetium-FFm MDP IV

[Series 1: wbr_bone_40 whole body · 2.66mm/px · 1 of 1 slices shown (1 of 2)]
[im 1/1]
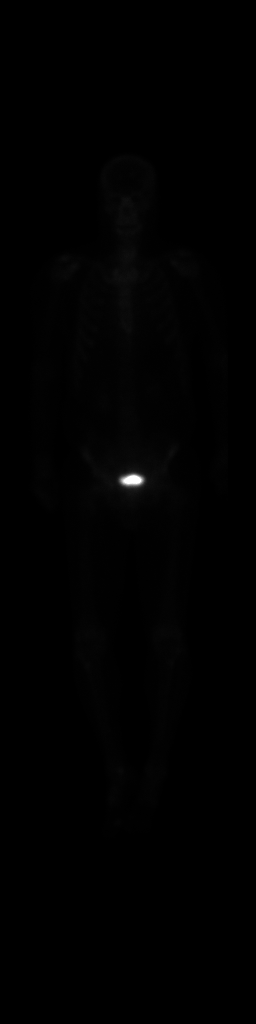

[Series 1: wbr_bone_40 whole body · 2.66mm/px · 1 of 1 slices shown (2 of 2)]
[im 1/1]
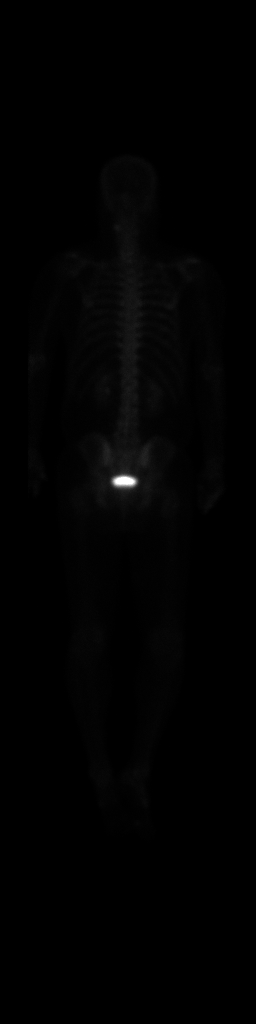

[2 of 2 positions shown; findings below may reference images not displayed]

FINDINGS: Degenerative type foci of activity in the posterior left mid
cervical spine, both shoulders, both knees, right ankle, and right
first MTP joint in the foot.

No definite abnormal osseous activity to suggest metastatic process
to the bones.

Normal background activity and renal excretion.
IMPRESSION: No definite osseous metastatic process. Degenerative type activity
as above.

## 2018-05-17 ENCOUNTER — Ambulatory Visit: Payer: Medicare Other | Admitting: Family

## 2018-05-17 ENCOUNTER — Other Ambulatory Visit (HOSPITAL_COMMUNITY): Payer: Medicare Other

## 2018-05-20 ENCOUNTER — Other Ambulatory Visit: Payer: Self-pay | Admitting: *Deleted

## 2018-05-21 ENCOUNTER — Telehealth: Payer: Self-pay | Admitting: *Deleted

## 2018-05-21 NOTE — Telephone Encounter (Signed)
Called patient to inform of stat labs on 06-12-18 @ [facility]) - arrival time - 9:45 am , then Ct to follow- arrival time - 10 am for Ct- patient to be NPO- 4 hrs. prior to test, lvm for a return call

## 2018-05-28 ENCOUNTER — Other Ambulatory Visit: Payer: Self-pay

## 2018-05-28 ENCOUNTER — Ambulatory Visit (HOSPITAL_COMMUNITY)
Admission: RE | Admit: 2018-05-28 | Discharge: 2018-05-28 | Disposition: A | Discharge status: Home / Self Care | Payer: Medicare Other | Source: Ambulatory Visit | Attending: Family | Admitting: Family

## 2018-05-28 ENCOUNTER — Ambulatory Visit (INDEPENDENT_AMBULATORY_CARE_PROVIDER_SITE_OTHER): Payer: Medicare Other | Admitting: Family

## 2018-05-28 ENCOUNTER — Encounter: Payer: Self-pay | Admitting: Family

## 2018-05-28 VITALS — BP 134/94 | HR 100 | Temp 98.2°F | Resp 18 | Ht 70.0 in | Wt 165.0 lb

## 2018-05-28 DIAGNOSIS — I714 Abdominal aortic aneurysm, without rupture, unspecified: Secondary | ICD-10-CM

## 2018-05-28 DIAGNOSIS — F17200 Nicotine dependence, unspecified, uncomplicated: Secondary | ICD-10-CM

## 2018-05-28 DIAGNOSIS — I713 Abdominal aortic aneurysm, ruptured, unspecified: Secondary | ICD-10-CM

## 2018-05-28 NOTE — Progress Notes (Signed)
[facility] [place]   CC: Follow up Abdominal Aortic Aneurysm  History of Present Illness  Stephen Palmer is a 67 y.o. ([birth year]) male initially referred to Dr. Scot Dock by Dr. Gerarda Fraction for evaluation of an abdominal aortic aneurysm. He was having some vague abdominal pain. He had an ultrasound of the aorta at [facility] on 09/15/2015 which showed a 4.4 cm abdominal aortic aneurysm. Currently he is not having significant abdominal pain. He does have some chronic low back pain. He sometimes has no pain in his left low back and hip with walking, has pain now in his left hip with sitting.  He reports spondylosis in his back, and degenerative disease in many of his joints. He denies any history of rest pain or nonhealing ulcers.  The left hip pain will sometimes radiate laterally down left leg. He is unaware of any family history of abdominal aortic aneurysms although he states that his sister had 3 brain aneurysms.  Heis receiving Lupron injections for prostate cancer, he finished radiation tx.  He was diagnosed with lung cancer about June 2018.  He states he has C1 vertebral issues which is causing pain, tingling, and tremor in his right UE.   Pt states he suffered stress as a child, states he has seen several mental health specialists and "they cannot help me".   He had an event in June 2018, legs buckled, he had a syncopal episode. Dr. Nils Pyle ordered an MRI of the brain performed in October 2018: IMPRESSION: 1. No evidence of intracranial metastases or acute abnormality. 2. Chronic left parieto-occipital infarct. 3. Brainstem greater than cerebral white matter T2 hyperintensities, nonspecific though most often seen with chronic small vessel Ischemia.   Diabetic: Yes, pt states his last A1C was 8.? Tobacco use: smoker (2 ppd, states he rolls his own from pipe tobacco, started at age 65 yrs) He states " I've got to have it, my life expectancy is  not long".    Past Medical History:  Diagnosis Date  . Anxiety   . Aortic aneurysm (HCC)    4.4 will have intervention when 5.5  . COPD (chronic obstructive pulmonary disease) ([place])   . Diabetes mellitus without complication ([place])   . Hypertension   . Prostate cancer [facility])    Past Surgical History:  Procedure Laterality Date  . COLONOSCOPY N/A 11/23/2015   Procedure: COLONOSCOPY;  Surgeon: Aviva Signs, MD;  Location: [facility];  Service: Gastroenterology;  Laterality: N/A;  . FRACTURE SURGERY    . PROSTATE BIOPSY    . SINUS SURGERY WITH INSTATRAK     Social History Social History   Socioeconomic History  . Marital status: Married    Spouse name: Not on file  . Number of children: Not on file  . Years of education: Not on file  . Highest education level: Not on file  Occupational History  . Not on file  Social Needs  . Financial resource strain: Not on file  . Food insecurity:    Worry: Not on file    Inability: Not on file  . Transportation needs:    Medical: Not on file    Non-medical: Not on file  Tobacco Use  . Smoking status: Current Every Day Smoker    Packs/day: 2.00    Years: 44.00    Pack years: 88.00    Types: Cigarettes  . Smokeless tobacco: Never Used  Substance and Sexual Activity  . Alcohol use: No  Alcohol/week: 0.0 oz  . Drug use: No  . Sexual activity: Never  Lifestyle  . Physical activity:    Days per week: Not on file    Minutes per session: Not on file  . Stress: Not on file  Relationships  . Social connections:    Talks on phone: Not on file    Gets together: Not on file    Attends religious service: Not on file    Active member of club or organization: Not on file    Attends meetings of clubs or organizations: Not on file    Relationship status: Not on file  . Intimate partner violence:    Fear of current or ex partner: Not on file    Emotionally abused: Not on file    Physically abused: Not on file    Forced sexual  activity: Not on file  Other Topics Concern  . Not on file  Social History Narrative  . Not on file   Family History Family History  Problem Relation Age of Onset  . Heart disease Father        before age 25  . Heart disease Mother   . Cancer Neg Hx     Current Outpatient Medications on File Prior to Visit  Medication Sig Dispense Refill  . ALPRAZolam (XANAX) 1 MG tablet Take 0.5 mg by mouth 2 (two) times daily.    Marland Kitchen glipiZIDE (GLUCOTROL) 5 MG tablet Take 10 mg by mouth 2 (two) times daily before a meal.     . tamsulosin (FLOMAX) 0.4 MG CAPS capsule     . cyclobenzaprine (FLEXERIL) 10 MG tablet Take 10 mg by mouth 3 (three) times daily as needed.    . DULoxetine (CYMBALTA) 30 MG capsule Take 30 mg by mouth daily.    Marland Kitchen leuprolide (LUPRON) 22.5 MG injection Inject 45 mg into the muscle every 6 (six) months.     No current facility-administered medications on file prior to visit.    Allergies  Allergen Reactions  . Sulfa Antibiotics Hives, Itching and Swelling    Tongue swelling  . Sulfasalazine Hives, Itching and Swelling    Tongue swelling    ROS: See HPI for pertinent positives and negatives.  Physical Examination  Vitals:   05/28/18 0845  BP: (!) 134/94  Pulse: 100  Resp: 18  Temp: 98.2 F (36.8 C)  TempSrc: Oral  SpO2: 97%  Weight: 165 lb (74.8 kg)  Height: 5\' 10"  (1.778 m)   Body mass index is 23.68 kg/m.  General: A&O x 3, WDmale, strong odor of cigarette smoke on his person. HEENT: no gross abnormalities  Pulmonary: Sym exp,respirations are non labored,fair air movement in all fields, no rales, rhonchi, or wheezing. Cardiac: Regular rhythm, no detected murmur.  Carotid Bruits Right Left   Negative Negative   Abdominal aortic pulse is not palpable Radial pulses are 2+ palpable and =   VASCULAR EXAM:  LE Pulses Right Left  FEMORAL 2+ palpable 3+ palpable   POPLITEAL 2+ palpable  not palpable   POSTERIOR TIBIAL 2+ palpable  2+ palpable   DORSALIS PEDIS ANTERIOR TIBIAL notpalpable  not palpable     Gastrointestinal: soft, NTND, -G/R, - HSM, reducible moderate sizedventral hernia, - CVAT B. Skin: No rash, no cellulitis, no ulcers noted.  Musculoskeletal: M/S 5/5 throughout, Extremities without ischemic changes. Neurologic: CN 2-12 intact, Pain and light touch intact in extremities are intact except, Motor exam as listed above Psychiatric: Normal thought content, mood appropriate to  clinical situation.    DATA  AAA Duplex:  Previous size: 4.7 cm (Date: 11/14/17), Right CIA: 1.1 cm; Left CIA: 0.7 cm   Current size:  5.10 cm(Date: 05/28/18); Right CIA: 1.1; Left CIA: 0.9 cm  Medical Decision Making  The patient is a 67 y.o. male who presents with asymptomatic AAA with increase in size to 5.1 cm today, compared to 4.7 cm on 11-14-17.   Based on this patient's exam and diagnostic studies, the patient will follow up in 2 weeks with the following studies: CTA abd/pelvis, see Dr. Scot Dock afterward. However, Dr. Scot Dock is not available until 4 weeks, will schedule with next available surgeon to see after CTA.   Over 3 minutes was spent counseling patient re smoking cessation, and patient was given several free resources re smoking cessation.    Consideration for repair of AAA would be made when the size is 5.0 cm, growth > 1 cm/yr, and symptomatic status.        The patient was given information about AAA including signs, symptoms, treatment,and how to minimize the risk of enlargement and rupture of aneurysms.    I emphasized the importance of maximal medical management including strict control of blood pressure, blood glucose, and lipid levels, antiplatelet agents, obtaining regular exercise, and  cessation of smoking.   The patient was advised to call 911 should the patient experience sudden onset abdominal or back pain.   Thank you for allowing  [place] to participate in this patient's care.  Clemon Chambers, RN, MSN, FNP-C [facility] [place] Office: [phone]  Clinic Physician: Early  05/28/2018, 10:40 PM

## 2018-05-28 NOTE — Patient Instructions (Signed)
Steps to Quit Smoking Smoking tobacco can be bad for your health. It can also affect almost every organ in your body. Smoking puts you and people around you at risk for many serious long-lasting (chronic) diseases. Quitting smoking is hard, but it is one of the best things that you can do for your health. It is never too late to quit. What are the benefits of quitting smoking? When you quit smoking, you lower your risk for getting serious diseases and conditions. They can include:  Lung cancer or lung disease.  Heart disease.  Stroke.  Heart attack.  Not being able to have children (infertility).  Weak bones (osteoporosis) and broken bones (fractures).  If you have coughing, wheezing, and shortness of breath, those symptoms may get better when you quit. You may also get sick less often. If you are pregnant, quitting smoking can help to lower your chances of having a baby of low birth weight. What can I do to help me quit smoking? Talk with your doctor about what can help you quit smoking. Some things you can do (strategies) include:  Quitting smoking totally, instead of slowly cutting back how much you smoke over a period of time.  Going to in-person counseling. You are more likely to quit if you go to many counseling sessions.  Using resources and support systems, such as: ? Online chats with a counselor. ? Phone quitlines. ? Printed self-help materials. ? Support groups or group counseling. ? Text messaging programs. ? Mobile phone apps or applications.  Taking medicines. Some of these medicines may have nicotine in them. If you are pregnant or breastfeeding, do not take any medicines to quit smoking unless your doctor says it is okay. Talk with your doctor about counseling or other things that can help you.  Talk with your doctor about using more than one strategy at the same time, such as taking medicines while you are also going to in-person counseling. This can help make  quitting easier. What things can I do to make it easier to quit? Quitting smoking might feel very hard at first, but there is a lot that you can do to make it easier. Take these steps:  Talk to your family and friends. Ask them to support and encourage you.  Call phone quitlines, reach out to support groups, or work with a counselor.  Ask people who smoke to not smoke around you.  Avoid places that make you want (trigger) to smoke, such as: ? Bars. ? Parties. ? Smoke-break areas at work.  Spend time with people who do not smoke.  Lower the stress in your life. Stress can make you want to smoke. Try these things to help your stress: ? Getting regular exercise. ? Deep-breathing exercises. ? Yoga. ? Meditating. ? Doing a body scan. To do this, close your eyes, focus on one area of your body at a time from head to toe, and notice which parts of your body are tense. Try to relax the muscles in those areas.  Download or buy apps on your mobile phone or tablet that can help you stick to your quit plan. There are many free apps, such as QuitGuide from the CDC (Centers for Disease Control and Prevention). You can find more support from smokefree.gov and other websites.  This information is not intended to replace advice given to you by your health care provider. Make sure you discuss any questions you have with your health care provider. Document Released: 09/30/2009 Document   Revised: 08/01/2016 Document Reviewed: 04/20/2015 Elsevier Interactive Patient Education  2018 [place].     Abdominal Aortic Aneurysm Blood pumps away from the heart through tubes (blood vessels) called arteries. Aneurysms are weak or damaged places in the wall of an artery. It bulges out like a balloon. An abdominal aortic aneurysm happens in the main artery of the body (aorta). It can burst or tear, causing bleeding inside the body. This is an emergency. It needs treatment right away. What are the causes? The  exact cause is unknown. Things that could cause this problem include:  Fat and other substances building up in the lining of a tube.  Swelling of the walls of a blood vessel.  Certain tissue diseases.  Belly (abdominal) trauma.  An infection in the main artery of the body.  What increases the risk? There are things that make it more likely for you to have an aneurysm. These include:  Being over the age of 67 years old.  Having high blood pressure (hypertension).  Being a male.  Being white.  Being very overweight (obese).  Having a family history of aneurysm.  Using tobacco products.  What are the signs or symptoms? Symptoms depend on the size of the aneurysm and how fast it grows. There may not be symptoms. If symptoms occur, they can include:  Pain (belly, side, lower back, or groin).  Feeling full after eating a small amount of food.  Feeling sick to your stomach (nauseous), throwing up (vomiting), or both.  Feeling a lump in your belly that feels like it is beating (pulsating).  Feeling like you will pass out (faint).  How is this treated?  Medicine to control blood pressure and pain.  Imaging tests to see if the aneurysm gets bigger.  Surgery. How is this prevented? To lessen your chance of getting this condition:  Stop smoking. Stop chewing tobacco.  Limit or avoid alcohol.  Keep your blood pressure, blood sugar, and cholesterol within normal limits.  Eat less salt.  Eat foods low in saturated fats and cholesterol. These are found in animal and whole dairy products.  Eat more fiber. Fiber is found in whole grains, vegetables, and fruits.  Keep a healthy weight.  Stay active and exercise often.  This information is not intended to replace advice given to you by your health care provider. Make sure you discuss any questions you have with your health care provider. Document Released: 03/31/2013 Document Revised: 05/11/2016 Document Reviewed:  01/03/2013 Elsevier Interactive Patient Education  2017 [employer].

## 2018-06-05 ENCOUNTER — Other Ambulatory Visit: Payer: Self-pay

## 2018-06-11 ENCOUNTER — Encounter: Payer: Self-pay | Admitting: *Deleted

## 2018-06-11 NOTE — Progress Notes (Signed)
[place] order for CT chest with contrast  And Istat creatinine to [facility] in [place]. Requested by their scheduling department. 1105 Information received.

## 2018-06-12 DIAGNOSIS — I714 Abdominal aortic aneurysm, without rupture: Secondary | ICD-10-CM | POA: Diagnosis not present

## 2018-06-12 DIAGNOSIS — J439 Emphysema, unspecified: Secondary | ICD-10-CM | POA: Diagnosis not present

## 2018-06-12 DIAGNOSIS — C3492 Malignant neoplasm of unspecified part of left bronchus or lung: Secondary | ICD-10-CM | POA: Diagnosis not present

## 2018-06-12 DIAGNOSIS — R918 Other nonspecific abnormal finding of lung field: Secondary | ICD-10-CM | POA: Diagnosis not present

## 2018-06-12 DIAGNOSIS — I700 Atherosclerosis of aorta: Secondary | ICD-10-CM | POA: Diagnosis not present

## 2018-06-12 DIAGNOSIS — I2510 Atherosclerotic heart disease of native coronary artery without angina pectoris: Secondary | ICD-10-CM | POA: Diagnosis not present

## 2018-06-13 ENCOUNTER — Telehealth: Payer: Self-pay | Admitting: Urology

## 2018-06-13 ENCOUNTER — Ambulatory Visit
Admission: RE | Admit: 2018-06-13 | Discharge: 2018-06-13 | Disposition: A | Discharge status: Home / Self Care | Payer: Medicare Other | Source: Ambulatory Visit | Attending: Urology | Admitting: Urology

## 2018-06-13 DIAGNOSIS — C3492 Malignant neoplasm of unspecified part of left bronchus or lung: Secondary | ICD-10-CM

## 2018-06-13 NOTE — Telephone Encounter (Addendum)
Mr. Weinert missed his routine scheduled 38-month follow-up visit today so I called and spoke with him over the phone to review the results of his recent CT chest which shows overall disease stability with continued interval decrease in size of the treated left upper lobe nodule.  He reports feeling well and has no new or progressive symptoms.  He specifically denies chest pain, increased cough, increased shortness of breath, hemoptysis, fever, chills, nausea, vomiting or night sweats.  He has a vascular surgeon in [place] that is following him for the AAA.  He had a recent follow-up ultrasound for surveillance which showed the AAA to have increased above 5 cm. He is currently scheduled for a CT of the abdomen next week in anticipation of potential endovascular repair in the near future.  From a lung cancer standpoint he is doing very well and is pleased with his progress to date.  We will plan to see him back in 6 months with a repeat CT chest prior to that visit.  He knows to call at anytime in the interim with any questions or concerns regarding his previous radiotherapy or development of any new or progressive symptoms.   [name], PA-C

## 2018-06-19 ENCOUNTER — Other Ambulatory Visit: Payer: Self-pay

## 2018-06-19 ENCOUNTER — Ambulatory Visit: Payer: Medicare Other | Admitting: Vascular Surgery

## 2018-06-19 ENCOUNTER — Ambulatory Visit (INDEPENDENT_AMBULATORY_CARE_PROVIDER_SITE_OTHER): Payer: Medicare Other | Admitting: Vascular Surgery

## 2018-06-19 ENCOUNTER — Ambulatory Visit
Admission: RE | Admit: 2018-06-19 | Discharge: 2018-06-19 | Disposition: A | Discharge status: Home / Self Care | Payer: Medicare Other | Source: Ambulatory Visit | Attending: Vascular Surgery | Admitting: Vascular Surgery

## 2018-06-19 ENCOUNTER — Encounter: Payer: Self-pay | Admitting: Vascular Surgery

## 2018-06-19 VITALS — BP 127/86 | HR 100 | Temp 97.1°F | Resp 18 | Ht 70.0 in | Wt 167.0 lb

## 2018-06-19 DIAGNOSIS — I714 Abdominal aortic aneurysm, without rupture, unspecified: Secondary | ICD-10-CM

## 2018-06-19 DIAGNOSIS — I713 Abdominal aortic aneurysm, ruptured, unspecified: Secondary | ICD-10-CM

## 2018-06-19 MED ORDER — IOPAMIDOL (ISOVUE-370) INJECTION 76%
75.0000 mL | Freq: Once | INTRAVENOUS | Status: AC | PRN
Start: 2018-06-19 — End: 2018-06-19
  Administered 2018-06-19: 75 mL via INTRAVENOUS

## 2018-06-19 NOTE — Progress Notes (Signed)
Patient name: Stephen Palmer MRN: [id number] DOB: [birth year] Sex: male  REASON FOR VISIT:   Follow-up of abdominal aortic aneurysm.  HPI:   Stephen Palmer is a pleasant 67 y.o. male who I last saw over 2 years ago on 10/01/2015.  He had an ultrasound done at [facility] on 09/15/2015 which showed a 4.4 cm abdominal aortic aneurysm.  The patient was seen by our nurse practitioner on 05/28/2018 for a follow-up duplex scan.  At that time the aneurysm had enlarged to 5.1 cm in maximum diameter.  The aneurysm had increased in size from 4.7 cm on 11/14/2017.  Since his last visit, he denies any abdominal pain or back pain.  There have been no significant changes in his medical history.  He continues to be a heavy smoker and smokes 2 packs/day.  He has been smoking for 44 years.  He denies any history of claudication, rest pain, or nonhealing ulcers.  He says that his diabetes has been under good control.  He also feels that his hypertension has been under good control.  Past Medical History:  Diagnosis Date  . Anxiety   . Aortic aneurysm (HCC)    4.4 will have intervention when 5.5  . COPD (chronic obstructive pulmonary disease) ([place])   . Diabetes mellitus without complication ([place])   . Hypertension   . Prostate cancer [facility])     Family History  Problem Relation Age of Onset  . Heart disease Father        before age 42  . Heart disease Mother   . Cancer Neg Hx     SOCIAL HISTORY: Social History   Tobacco Use  . Smoking status: Current Every Day Smoker    Packs/day: 2.00    Years: 44.00    Pack years: 88.00    Types: Cigarettes  . Smokeless tobacco: Never Used  Substance Use Topics  . Alcohol use: No    Alcohol/week: 0.0 oz    Allergies  Allergen Reactions  . Sulfa Antibiotics Hives, Itching and Swelling    Tongue swelling  . Sulfasalazine Hives, Itching and Swelling    Tongue swelling    Current Outpatient Medications  Medication Sig Dispense Refill  .  alprazolam (XANAX) 2 MG tablet Take 2 mg by mouth 2 (two) times daily.  2  . DULoxetine (CYMBALTA) 30 MG capsule Take 30 mg by mouth daily.    Marland Kitchen glipiZIDE (GLUCOTROL) 5 MG tablet Take 10 mg by mouth 2 (two) times daily before a meal.     . leuprolide (LUPRON) 22.5 MG injection Inject 45 mg into the muscle every 6 (six) months.    . tamsulosin (FLOMAX) 0.4 MG CAPS capsule     . ALPRAZolam (XANAX) 1 MG tablet Take 0.5 mg by mouth 2 (two) times daily.    . cyclobenzaprine (FLEXERIL) 10 MG tablet Take 10 mg by mouth 3 (three) times daily as needed.    Marland Kitchen FARXIGA 10 MG TABS tablet Take 10 mg by mouth daily.  2   No current facility-administered medications for this visit.     REVIEW OF SYSTEMS:  [X]  denotes positive finding, [ ]  denotes negative finding Cardiac  Comments:  Chest pain or chest pressure:    Shortness of breath upon exertion:    Short of breath when lying flat:    Irregular heart rhythm:        Vascular    Pain in calf, thigh, or hip brought on by ambulation:  Pain in feet at night that wakes you up from your sleep:     Blood clot in your veins:    Leg swelling:         Pulmonary    Oxygen at home:    Productive cough:     Wheezing:         Neurologic    Sudden weakness in arms or legs:     Sudden numbness in arms or legs:     Sudden onset of difficulty speaking or slurred speech:    Temporary loss of vision in one eye:     Problems with dizziness:         Gastrointestinal    Blood in stool:     Vomited blood:         Genitourinary    Burning when urinating:     Blood in urine:        Psychiatric    Major depression:         Hematologic    Bleeding problems:    Problems with blood clotting too easily:        Skin    Rashes or ulcers:        Constitutional    Fever or chills:     PHYSICAL EXAM:   Vitals:   06/19/18 1445  BP: 127/86  Pulse: 100  Resp: 18  Temp: (!) 97.1 F (36.2 C)  TempSrc: Oral  SpO2: 94%  Weight: 167 lb (75.8 kg)    Height: 5\' 10"  (1.778 m)    GENERAL: The patient is a well-nourished male, in no acute distress. The vital signs are documented above. CARDIAC: There is a regular rate and rhythm.  VASCULAR: I do not detect carotid bruits. He has palpable femoral, popliteal, dorsalis pedis, and posterior tibial pulses bilaterally. He has no significant lower extremity swelling. PULMONARY: There is good air exchange bilaterally without wheezing or rales. ABDOMEN: Soft and non-tender with normal pitched bowel sounds.  He has a somewhat protuberant abdomen and his aneurysm is difficult to palpate. MUSCULOSKELETAL: There are no major deformities or cyanosis. NEUROLOGIC: No focal weakness or paresthesias are detected. SKIN: There are no ulcers or rashes noted. PSYCHIATRIC: The patient has a normal affect.  DATA:    CT ANGIOGRAM ABDOMEN PELVIS: I have reviewed the images of his CT angiogram of the abdomen and pelvis.  Based on my review I think this patient has a type IV thoracoabdominal aneurysm.  The descending thoracic aorta measures 3.3 cm and has laminated thrombus present.  At the level of the superior mesenteric artery there is also significant laminated thrombus and ectasia.  The juxtarenal aneurysm measures 3.4 cm in maximum diameter.  The largest infrarenal component is 5.1 cm.  The iliac arteries are not aneurysmal.  MEDICAL ISSUES:   TYPE IV THORACOABDOMINAL ANEURYSM: His aneurysm is increased slightly in size from 4.7 to 5.1 cm.  I have explained that in a normal risk patient we would consider elective repair at 5.5 cm.  The situation is complicated by the fact that he in fact has a type IV thoracoabdominal aneurysm.  This was his first CT scan.  If the aneurysm enlarges to greater than 5.5 cm I think he needs to be considered for elective repair.  However based on the anatomy he would require either a fenestrated graft or a thoracoabdominal exposure for repair.  Thus we would have to refer him to  [facility].  Given that the aneurysm has increased only  slightly in size to 5.1 cm we have discussed doing his next follow-up study here in 6 months.  I have ordered a CT of the chest abdomen and pelvis.  If the aneurysm continues to enlarge that I think we will need to arrange his next follow-up in [place].  We have again had a long discussion about the importance of tobacco cessation.  I have explained that continued tobacco use and poorly controlled blood pressure of the 2 main risk factors for continued aneurysm expansion and rupture.  I will see him back in 6 months.  He knows to call sooner if he has problems.  Deitra Mayo [facility] [phone]

## 2018-06-24 ENCOUNTER — Telehealth: Payer: Self-pay | Admitting: Radiation Oncology

## 2018-06-24 NOTE — Telephone Encounter (Signed)
Opened in error

## 2018-07-09 DIAGNOSIS — F419 Anxiety disorder, unspecified: Secondary | ICD-10-CM | POA: Diagnosis not present

## 2018-07-09 DIAGNOSIS — F22 Delusional disorders: Secondary | ICD-10-CM | POA: Diagnosis not present

## 2018-07-09 DIAGNOSIS — Z681 Body mass index (BMI) 19 or less, adult: Secondary | ICD-10-CM | POA: Diagnosis not present

## 2018-07-09 DIAGNOSIS — E782 Mixed hyperlipidemia: Secondary | ICD-10-CM | POA: Diagnosis not present

## 2018-07-09 DIAGNOSIS — G8929 Other chronic pain: Secondary | ICD-10-CM | POA: Diagnosis not present

## 2018-07-09 DIAGNOSIS — Z1389 Encounter for screening for other disorder: Secondary | ICD-10-CM | POA: Diagnosis not present

## 2018-07-09 DIAGNOSIS — E785 Hyperlipidemia, unspecified: Secondary | ICD-10-CM | POA: Diagnosis not present

## 2018-07-09 DIAGNOSIS — J449 Chronic obstructive pulmonary disease, unspecified: Secondary | ICD-10-CM | POA: Diagnosis not present

## 2018-07-09 DIAGNOSIS — J439 Emphysema, unspecified: Secondary | ICD-10-CM | POA: Diagnosis not present

## 2018-07-09 DIAGNOSIS — I10 Essential (primary) hypertension: Secondary | ICD-10-CM | POA: Diagnosis not present

## 2018-07-09 DIAGNOSIS — Z0001 Encounter for general adult medical examination with abnormal findings: Secondary | ICD-10-CM | POA: Diagnosis not present

## 2018-07-09 DIAGNOSIS — R946 Abnormal results of thyroid function studies: Secondary | ICD-10-CM | POA: Diagnosis not present

## 2018-07-09 DIAGNOSIS — E1165 Type 2 diabetes mellitus with hyperglycemia: Secondary | ICD-10-CM | POA: Diagnosis not present

## 2018-07-09 DIAGNOSIS — R972 Elevated prostate specific antigen [PSA]: Secondary | ICD-10-CM | POA: Diagnosis not present

## 2018-07-09 LAB — TSH
TSH: 1.82 (ref 0.41–5.90)
TSH: 2.02 (ref 0.41–5.90)

## 2018-07-09 LAB — HEPATIC FUNCTION PANEL
ALT: 18 (ref 10–40)
AST: 23 (ref 14–40)

## 2018-07-09 LAB — VITAMIN D 25 HYDROXY (VIT D DEFICIENCY, FRACTURES): Vit D, 25-Hydroxy: 18.5

## 2018-07-09 LAB — HEMOGLOBIN A1C
Hemoglobin A1C: 11.5
Hemoglobin A1C: 8.1

## 2018-07-09 LAB — MICROALBUMIN, URINE: Microalb, Ur: 150

## 2018-07-09 LAB — BASIC METABOLIC PANEL
BUN: 10 (ref 4–21)
BUN: 17 (ref 4–21)
CREATININE: 0.9 (ref 0.6–1.3)
Creatinine: 1.1 (ref 0.6–1.3)
POTASSIUM: 4.8 (ref 3.4–5.3)
SODIUM: 137 (ref 137–147)

## 2018-07-09 LAB — LIPID PANEL
CHOLESTEROL: 225 — AB (ref 0–200)
Cholesterol: 275 — AB (ref 0–200)
HDL: 36 (ref 35–70)
HDL: 42 (ref 35–70)
LDL Cholesterol: 152
LDL Cholesterol: 184
TRIGLYCERIDES: 244 — AB (ref 40–160)
Triglycerides: 187 — AB (ref 40–160)

## 2018-08-06 DIAGNOSIS — R0902 Hypoxemia: Secondary | ICD-10-CM | POA: Diagnosis not present

## 2018-08-06 DIAGNOSIS — Z8546 Personal history of malignant neoplasm of prostate: Secondary | ICD-10-CM | POA: Diagnosis not present

## 2018-08-06 DIAGNOSIS — J439 Emphysema, unspecified: Secondary | ICD-10-CM | POA: Diagnosis not present

## 2018-08-06 DIAGNOSIS — F17210 Nicotine dependence, cigarettes, uncomplicated: Secondary | ICD-10-CM | POA: Diagnosis not present

## 2018-08-06 DIAGNOSIS — C3492 Malignant neoplasm of unspecified part of left bronchus or lung: Secondary | ICD-10-CM | POA: Diagnosis not present

## 2018-08-06 DIAGNOSIS — Z7984 Long term (current) use of oral hypoglycemic drugs: Secondary | ICD-10-CM | POA: Diagnosis not present

## 2018-08-06 DIAGNOSIS — R079 Chest pain, unspecified: Secondary | ICD-10-CM | POA: Diagnosis not present

## 2018-08-06 DIAGNOSIS — Z79899 Other long term (current) drug therapy: Secondary | ICD-10-CM | POA: Diagnosis not present

## 2018-08-06 DIAGNOSIS — R0602 Shortness of breath: Secondary | ICD-10-CM | POA: Diagnosis not present

## 2018-08-06 DIAGNOSIS — F419 Anxiety disorder, unspecified: Secondary | ICD-10-CM | POA: Diagnosis not present

## 2018-08-06 DIAGNOSIS — F329 Major depressive disorder, single episode, unspecified: Secondary | ICD-10-CM | POA: Diagnosis not present

## 2018-08-06 DIAGNOSIS — C3412 Malignant neoplasm of upper lobe, left bronchus or lung: Secondary | ICD-10-CM | POA: Diagnosis not present

## 2018-08-15 ENCOUNTER — Ambulatory Visit (INDEPENDENT_AMBULATORY_CARE_PROVIDER_SITE_OTHER): Payer: Medicare Other | Admitting: Cardiovascular Disease

## 2018-08-15 ENCOUNTER — Telehealth: Payer: Self-pay | Admitting: Cardiovascular Disease

## 2018-08-15 ENCOUNTER — Encounter: Payer: Self-pay | Admitting: Cardiovascular Disease

## 2018-08-15 ENCOUNTER — Encounter: Payer: Self-pay | Admitting: *Deleted

## 2018-08-15 VITALS — BP 128/82 | HR 88 | Ht 70.0 in | Wt 168.0 lb

## 2018-08-15 DIAGNOSIS — R079 Chest pain, unspecified: Secondary | ICD-10-CM | POA: Diagnosis not present

## 2018-08-15 DIAGNOSIS — I714 Abdominal aortic aneurysm, without rupture, unspecified: Secondary | ICD-10-CM

## 2018-08-15 DIAGNOSIS — I2510 Atherosclerotic heart disease of native coronary artery without angina pectoris: Secondary | ICD-10-CM

## 2018-08-15 NOTE — Addendum Note (Signed)
Addended by: Laurine Blazer on: 08/15/2018 01:58 PM   Modules accepted: Orders

## 2018-08-15 NOTE — Telephone Encounter (Signed)
°  Precert needed for: lexiscan - chest pain   Location: [facility]    Date: Sept 5, 2019

## 2018-08-15 NOTE — Patient Instructions (Signed)
Medication Instructions:  Continue all current medications.  Labwork: none  Testing/Procedures:  Your physician has requested that you have a lexiscan myoview. For further information please visit [email or web address]. Please follow instruction sheet, as given.  Office will contact with results via phone or letter.    Follow-Up: Next available.    Any Other Special Instructions Will Be Listed Below (If Applicable).  If you need a refill on your cardiac medications before your next appointment, please call your pharmacy.

## 2018-08-15 NOTE — Progress Notes (Signed)
CARDIOLOGY CONSULT NOTE  Patient ID: Stephen Palmer MRN: [id number] DOB/AGE: Apr 03, 1951 67 y.o.  Admit date: (Not on file) Primary Physician: [name], PA-C Referring Physician: Dr. Hilma Favors  Reason for Consultation: Coronary artery calcifications and chest pain  HPI: ALVERTO Palmer is a 67 y.o. male who is being seen today for the evaluation of coronary artery calcifications and chest pain at the request of Sharilyn Sites, MD.   Past medical history includes an abdominal aortic aneurysm measuring 5.1 cm in diameter most recently and followed by vascular surgery.  ECG performed in the office today which I ordered and personally interpreted demonstrates normal sinus rhythm with no ischemic ST segment or T-wave abnormalities, nor any arrhythmias.  He has a history of left upper lobe non-small cell lung cancer.  He also has a history of prostate cancer.  He is to be able to do 100 push-ups but now he developed significant retrosternal chest pain when trying to do 10.  He has episodic retrosternal chest pains accompanied by fatigue.  He smokes 2 packs of cigarettes daily.  He has a history of a stroke.  He underwent a chest CT at [facility] on 06/12/2018 which showed coronary artery calcifications.  He denies palpitations, leg swelling, orthopnea, and paroxysmal nocturnal dyspnea.     Allergies  Allergen Reactions  . Sulfa Antibiotics Hives, Itching and Swelling    Tongue swelling  . Sulfasalazine Hives, Itching and Swelling    Tongue swelling    Current Outpatient Medications  Medication Sig Dispense Refill  . Albuterol (VENTOLIN IN) Inhale into the lungs.    Marland Kitchen alprazolam (XANAX) 2 MG tablet Take 2 mg by mouth 2 (two) times daily.  2  . alprazolam (XANAX) 2 MG tablet Take 2 mg by mouth 2 (two) times daily.    . cyclobenzaprine (FLEXERIL) 10 MG tablet Take 10 mg by mouth 3 (three) times daily as needed.    . DULoxetine (CYMBALTA) 30 MG capsule Take 30 mg  by mouth daily.    Marland Kitchen glipiZIDE (GLUCOTROL) 10 MG tablet Take 10 mg by mouth 2 (two) times daily before a meal.    . tamsulosin (FLOMAX) 0.4 MG CAPS capsule     . vitamin C (ASCORBIC ACID) 500 MG tablet Take 500 mg by mouth daily.     No current facility-administered medications for this visit.     Past Medical History:  Diagnosis Date  . Anxiety   . Aortic aneurysm (HCC)    4.4 will have intervention when 5.5  . COPD (chronic obstructive pulmonary disease) ([place])   . Diabetes mellitus without complication ([place])   . Hypertension   . Prostate cancer [facility])     Past Surgical History:  Procedure Laterality Date  . COLONOSCOPY N/A 11/23/2015   Procedure: COLONOSCOPY;  Surgeon: Aviva Signs, MD;  [facility];  Service: Gastroenterology;  Laterality: N/A;  . FRACTURE SURGERY    . PROSTATE BIOPSY    . SINUS SURGERY WITH INSTATRAK      Social History   Socioeconomic History  . Marital status: Married    Spouse name: Not on file  . Number of children: Not on file  . Years of education: Not on file  . Highest education level: Not on file  Occupational History  . Not on file  Social Needs  . Financial resource strain: Not on file  . Food insecurity:    Worry: Not on file  Inability: Not on file  . Transportation needs:    Medical: Not on file    Non-medical: Not on file  Tobacco Use  . Smoking status: Current Every Day Smoker    Packs/day: 2.00    Years: 44.00    Pack years: 88.00    Types: Cigarettes  . Smokeless tobacco: Never Used  Substance and Sexual Activity  . Alcohol use: No    Alcohol/week: 0.0 standard drinks  . Drug use: No  . Sexual activity: Never  Lifestyle  . Physical activity:    Days per week: Not on file    Minutes per session: Not on file  . Stress: Not on file  Relationships  . Social connections:    Talks on phone: Not on file    Gets together: Not on file    Attends religious service: Not on file    Active member of club or  organization: Not on file    Attends meetings of clubs or organizations: Not on file    Relationship status: Not on file  . Intimate partner violence:    Fear of current or ex partner: Not on file    Emotionally abused: Not on file    Physically abused: Not on file    Forced sexual activity: Not on file  Other Topics Concern  . Not on file  Social History Narrative  . Not on file     No family history of premature CAD in 1st degree relatives.  Current Meds  Medication Sig  . Albuterol (VENTOLIN IN) Inhale into the lungs.  Marland Kitchen alprazolam (XANAX) 2 MG tablet Take 2 mg by mouth 2 (two) times daily.  Marland Kitchen alprazolam (XANAX) 2 MG tablet Take 2 mg by mouth 2 (two) times daily.  . cyclobenzaprine (FLEXERIL) 10 MG tablet Take 10 mg by mouth 3 (three) times daily as needed.  . DULoxetine (CYMBALTA) 30 MG capsule Take 30 mg by mouth daily.  Marland Kitchen glipiZIDE (GLUCOTROL) 10 MG tablet Take 10 mg by mouth 2 (two) times daily before a meal.  . tamsulosin (FLOMAX) 0.4 MG CAPS capsule   . vitamin C (ASCORBIC ACID) 500 MG tablet Take 500 mg by mouth daily.  . [DISCONTINUED] glipiZIDE (GLUCOTROL) 5 MG tablet Take 10 mg by mouth 2 (two) times daily before a meal.       Review of systems complete and found to be negative unless listed above in HPI    Physical exam Blood pressure 128/82, pulse 88, height 5\' 10"  (1.778 m), weight 168 lb (76.2 kg), SpO2 97 %. General: NAD Neck: No JVD, no thyromegaly or thyroid nodule.  Lungs: Diminished sounds throughout, no crackles or wheezes. CV: Nondisplaced PMI. Regular rate and rhythm, normal S1/S2, no S3/S4, no murmur.  No peripheral edema.   Abdomen: Soft, nontender, no distention.  Skin: Intact without lesions or rashes.  Neurologic: Alert and oriented x 3.  Psych: Normal affect. Extremities: No clubbing or cyanosis.  HEENT: Normal.   ECG: Most recent ECG reviewed.   Labs: Lab Results  Component Value Date/Time   K 3.2 (L) 05/13/2012 08:06 PM   BUN 24  (H) 05/13/2012 08:06 PM   CREATININE 1.01 05/13/2012 08:06 PM   HGB 12.6 (L) 05/13/2012 08:06 PM     Lipids: No results found for: LDLCALC, LDLDIRECT, CHOL, TRIG, HDL      ASSESSMENT AND PLAN:  1.  Chest pain and coronary artery calcifications: Unclear etiology as symptoms can occur both with and without exertion.  He has a long history of tobacco abuse. I will proceed with a nuclear myocardial perfusion imaging study to evaluate for ischemic heart disease (Lexiscan Myoview).    Disposition: Follow up in 2 months   Signed: Kate Sable, M.D., F.A.C.C.  08/15/2018, 1:24 PM

## 2018-08-22 ENCOUNTER — Encounter (HOSPITAL_COMMUNITY)
Admission: RE | Admit: 2018-08-22 | Discharge: 2018-08-22 | Disposition: A | Discharge status: Home / Self Care | Payer: Medicare Other | Source: Ambulatory Visit | Attending: Cardiovascular Disease | Admitting: Cardiovascular Disease

## 2018-08-22 ENCOUNTER — Encounter (HOSPITAL_COMMUNITY): Payer: Self-pay

## 2018-08-22 ENCOUNTER — Encounter (HOSPITAL_BASED_OUTPATIENT_CLINIC_OR_DEPARTMENT_OTHER)
Admission: RE | Admit: 2018-08-22 | Discharge: 2018-08-22 | Disposition: A | Discharge status: Home / Self Care | Payer: Medicare Other | Source: Ambulatory Visit | Attending: Cardiovascular Disease | Admitting: Cardiovascular Disease

## 2018-08-22 DIAGNOSIS — R079 Chest pain, unspecified: Secondary | ICD-10-CM

## 2018-08-22 LAB — NM MYOCAR MULTI W/SPECT W/WALL MOTION / EF
CHL CUP NUCLEAR SDS: 3
CHL CUP NUCLEAR SRS: 2
CHL CUP RESTING HR STRESS: 78 {beats}/min
CSEPPHR: 97 {beats}/min
LHR: 0.36
LV dias vol: 44 mL (ref 62–150)
LVSYSVOL: 13 mL
SSS: 5
TID: 0.94

## 2018-08-22 MED ORDER — TECHNETIUM TC 99M TETROFOSMIN IV KIT
10.0000 | PACK | Freq: Once | INTRAVENOUS | Status: AC | PRN
  Administered 2018-08-22: 9.4 via INTRAVENOUS

## 2018-08-22 MED ORDER — SODIUM CHLORIDE 0.9% FLUSH
INTRAVENOUS | Status: AC
  Administered 2018-08-22: 10 mL via INTRAVENOUS
  Filled 2018-08-22: qty 10

## 2018-08-22 MED ORDER — REGADENOSON 0.4 MG/5ML IV SOLN
INTRAVENOUS | Status: AC
  Administered 2018-08-22: 0.4 mg via INTRAVENOUS
  Filled 2018-08-22: qty 5

## 2018-08-22 MED ORDER — TECHNETIUM TC 99M TETROFOSMIN IV KIT
30.0000 | PACK | Freq: Once | INTRAVENOUS | Status: AC | PRN
  Administered 2018-08-22: 28 via INTRAVENOUS

## 2018-08-27 ENCOUNTER — Telehealth: Payer: Self-pay | Admitting: *Deleted

## 2018-08-27 NOTE — Telephone Encounter (Signed)
Notes recorded by Laurine Blazer, LPN on 3/90/3009 at 2:33 PM EDT Patient notified. Copy to pmd. Follow up scheduled for November with Dr. Bronson Ing.   ------  Notes recorded by Arnoldo Lenis, MD on 08/23/2018 at 1:04 PM EDT Normal stress test, no evidence of any significant blockages   J BrancH MD

## 2018-09-05 ENCOUNTER — Ambulatory Visit (INDEPENDENT_AMBULATORY_CARE_PROVIDER_SITE_OTHER): Payer: Medicare Other | Admitting: "Endocrinology

## 2018-09-05 ENCOUNTER — Encounter: Payer: Self-pay | Admitting: "Endocrinology

## 2018-09-05 VITALS — BP 116/80 | HR 90 | Ht 70.0 in | Wt 169.0 lb

## 2018-09-05 DIAGNOSIS — E1159 Type 2 diabetes mellitus with other circulatory complications: Secondary | ICD-10-CM

## 2018-09-05 DIAGNOSIS — F17200 Nicotine dependence, unspecified, uncomplicated: Secondary | ICD-10-CM | POA: Diagnosis not present

## 2018-09-05 DIAGNOSIS — I2510 Atherosclerotic heart disease of native coronary artery without angina pectoris: Secondary | ICD-10-CM

## 2018-09-05 NOTE — Progress Notes (Signed)
Endocrinology Consult Note       09/05/2018, 5:40 PM   Subjective:    Patient ID: Stephen Palmer, male    DOB: 07/30/1951.  Stephen Palmer is being seen in consultation for management of currently uncontrolled symptomatic diabetes requested by  [name], PA-C.   Past Medical History:  Diagnosis Date  . Anxiety   . Aortic aneurysm (HCC)    4.4 will have intervention when 5.5  . COPD (chronic obstructive pulmonary disease) ([place])   . Diabetes mellitus without complication ([place])   . Hypertension   . Prostate cancer [facility])    Past Surgical History:  Procedure Laterality Date  . COLONOSCOPY N/A 11/23/2015   Procedure: COLONOSCOPY;  Surgeon: Aviva Signs, MD;  Location[facility];  Service: Gastroenterology;  Laterality: N/A;  . FRACTURE SURGERY    . PROSTATE BIOPSY    . SINUS SURGERY WITH INSTATRAK     Social History   Socioeconomic History  . Marital status: Married    Spouse name: Not on file  . Number of children: Not on file  . Years of education: Not on file  . Highest education level: Not on file  Occupational History  . Not on file  Social Needs  . Financial resource strain: Not on file  . Food insecurity:    Worry: Not on file    Inability: Not on file  . Transportation needs:    Medical: Not on file    Non-medical: Not on file  Tobacco Use  . Smoking status: Current Every Day Smoker    Packs/day: 2.00    Years: 44.00    Pack years: 88.00    Types: Cigarettes  . Smokeless tobacco: Never Used  Substance and Sexual Activity  . Alcohol use: No    Alcohol/week: 0.0 standard drinks  . Drug use: No  . Sexual activity: Never  Lifestyle  . Physical activity:    Days per week: Not on file    Minutes per session: Not on file  . Stress: Not on file  Relationships  . Social connections:    Talks on phone: Not on file    Gets together: Not on file    Attends  religious service: Not on file    Active member of club or organization: Not on file    Attends meetings of clubs or organizations: Not on file    Relationship status: Not on file  Other Topics Concern  . Not on file  Social History Narrative  . Not on file   Outpatient Encounter Medications as of 09/05/2018  Medication Sig  . Albuterol (VENTOLIN IN) Inhale into the lungs every 8 (eight) hours as needed.   Marland Kitchen alprazolam (XANAX) 2 MG tablet Take 2 mg by mouth 2 (two) times daily.  . cyclobenzaprine (FLEXERIL) 10 MG tablet Take 10 mg by mouth 3 (three) times daily as needed.  . DULoxetine (CYMBALTA) 30 MG capsule Take 30 mg by mouth daily.  Marland Kitchen glipiZIDE (GLUCOTROL) 10 MG tablet Take 10 mg by mouth 2 (two) times daily before a meal.  . Leuprolide Acetate, 3 Month, (LUPRON DEPOT,  65-MONTH, IM) Inject into the muscle.  . tamsulosin (FLOMAX) 0.4 MG CAPS capsule   . vitamin C (ASCORBIC ACID) 500 MG tablet Take 500 mg by mouth daily.   No facility-administered encounter medications on file as of 09/05/2018.     ALLERGIES: Allergies  Allergen Reactions  . Sulfa Antibiotics Hives, Itching and Swelling    Tongue swelling  . Sulfasalazine Hives, Itching and Swelling    Tongue swelling    VACCINATION STATUS:  There is no immunization history on file for this patient.  Diabetes  He presents for his initial diabetic visit. He has type 2 diabetes mellitus. Onset time: He was diagnosed at approximate age of 32 years. His disease course has been worsening. There are no hypoglycemic associated symptoms. Pertinent negatives for hypoglycemia include no confusion, headaches, pallor or seizures. Associated symptoms include blurred vision, polydipsia and polyuria. Pertinent negatives for diabetes include no chest pain, no fatigue, no polyphagia and no weakness. There are no hypoglycemic complications. Symptoms are worsening. Diabetic complications include a CVA. Risk factors for coronary artery disease  include diabetes mellitus, hypertension, male sex and tobacco exposure. Current diabetic treatment includes oral agent (monotherapy) (Is currently taking glipizide 10 mg p.o. twice daily.). His weight is fluctuating minimally. He is following a generally unhealthy diet. When asked about meal planning, he reported none. He has not had a previous visit with a dietitian. He never participates in exercise. (He did not bring any meter nor logs to review.  His recent A1c was reported to be 8.4%. ) An ACE inhibitor/angiotensin II receptor blocker is not being taken. Eye exam is current.      Review of Systems  Constitutional: Negative for chills, fatigue, fever and unexpected weight change.  HENT: Negative for dental problem, mouth sores and trouble swallowing.   Eyes: Positive for blurred vision. Negative for visual disturbance.  Respiratory: Positive for cough and shortness of breath. Negative for choking, chest tightness and wheezing.   Cardiovascular: Negative for chest pain, palpitations and leg swelling.  Gastrointestinal: Negative for abdominal distention, abdominal pain, constipation, diarrhea, nausea and vomiting.  Endocrine: Positive for polydipsia and polyuria. Negative for polyphagia.  Genitourinary: Negative for dysuria, flank pain, hematuria and urgency.  Musculoskeletal: Negative for back pain, gait problem, myalgias and neck pain.  Skin: Negative for pallor, rash and wound.  Neurological: Negative for seizures, syncope, weakness, numbness and headaches.  Psychiatric/Behavioral: Negative for confusion and dysphoric mood.    Objective:    BP 116/80   Pulse 90   Ht 5\' 10"  (1.778 m)   Wt 169 lb (76.7 kg)   BMI 24.25 kg/m   Wt Readings from Last 3 Encounters:  09/05/18 169 lb (76.7 kg)  08/15/18 168 lb (76.2 kg)  06/19/18 167 lb (75.8 kg)     Physical Exam  Constitutional: He is oriented to person, place, and time. He appears well-developed and well-nourished. He is  cooperative. No distress.  HENT:  Head: Normocephalic and atraumatic.  Eyes: EOM are normal.  Neck: Normal range of motion. Neck supple. No tracheal deviation present. No thyromegaly present.  Cardiovascular: S1 normal and S2 normal. Exam reveals no gallop.  No murmur heard. Pulses:      Dorsalis pedis pulses are 1+ on the right side, and 1+ on the left side.       Posterior tibial pulses are 1+ on the right side, and 1+ on the left side.  Pulmonary/Chest: Effort normal. No respiratory distress. He has no wheezes.  Abdominal: He  exhibits no distension. There is no tenderness. There is no guarding and no CVA tenderness.  Musculoskeletal: He exhibits no edema.       Right shoulder: He exhibits no swelling and no deformity.  Neurological: He is alert and oriented to person, place, and time. He has normal strength and normal reflexes. No cranial nerve deficit or sensory deficit. Gait normal.  Skin: Skin is warm and dry. No rash noted. No cyanosis. Nails show no clubbing.  Psychiatric: He has a normal mood and affect. His speech is normal. Judgment normal. Cognition and memory are normal.    His recent labs are not reported.     Assessment & Plan:   1. DM type 2 causing vascular disease ([place])  - Stephen Palmer has currently uncontrolled symptomatic type 2 DM since 67 years of age,  with most recent A1c reported to be  8.4 %.  -Recent labs are not available to review. -his diabetes is complicated by CVA and he remains at a high risk for more acute and chronic complications which include CAD, CVA, CKD, retinopathy, and neuropathy. These are all discussed in detail with him.  - I have counseled him on diet management by adopting a carbohydrate restricted/protein rich diet.  - Suggestion is made for him to avoid simple carbohydrates  from his diet including Cakes, Sweet Desserts, Ice Cream, Soda (diet and regular), Sweet Tea, Candies, Chips, Cookies, Store Bought Juices, Alcohol in Excess  of  1-2 drinks a day, Artificial Sweeteners,  Coffee Creamer, and "Sugar-free" Products. This will help patient to have more stable blood glucose profile and potentially avoid unintended weight gain.  - I encouraged him to switch to  unprocessed or minimally processed complex starch and increased protein intake (animal or plant source), fruits, and vegetables.  - he is advised to stick to a routine mealtimes to eat 3 meals  a day and avoid unnecessary snacks ( to snack only to correct hypoglycemia).   - he will be scheduled with Jearld Fenton, RDN, CDE for individualized diabetes education.  - I have approached him with the following individualized plan to manage diabetes and patient agrees:   -He may need insulin treatment in order for him to achieve and maintain control of diabetes to target.  -In preparation, I approach him to start strict monitoring of blood glucose    4 times a day-before meals and at bedtime and return in 10 days with his meter and logs.  - he is encouraged to call clinic for blood glucose levels less than 70 or above 300 mg /dl. - I will continue glipizide 10 mg p.o. twice daily with breakfast and supper for lack of better options.. -He is not a suitable candidate for metformin, SGLT2 inhibitors, nor incretin therapy.  - Patient specific target  A1c;  LDL, HDL, Triglycerides, and  Waist Circumference were discussed in detail.  2) BP/HTN:  his blood pressure is controlled to target.  He is not on any antihypertensive medications, reported sulfa allergy.   3) Lipids/HPL: No recent lipid panel to review.  Patient is not on statins.   4)  Weight/Diet:  Body mass index is 24.25 kg/m.   Weight loss is not advisable for him.  CDE Consult will be initiated . Exercise, and detailed carbohydrates information provided  -  detailed on discharge instructions.  5) Chronic Care/Health Maintenance:  -he  Is not on ACEI/ARB and Statin medications and  is encouraged to initiate  and continue to follow up  with Ophthalmology, Dentist,  Podiatrist at least yearly or according to recommendations, and advised to  Quit smoking (this is his health risk). I have recommended yearly flu vaccine and pneumonia vaccine at least every 5 years; moderate intensity exercise for up to 150 minutes weekly; and  sleep for at least 7 hours a day.  - I advised patient to maintain close follow up with [name], PA-C for primary care needs.  - Time spent with the patient: 45 minutes, of which >50% was spent in obtaining information about his symptoms, reviewing his previous labs, evaluations, and treatments, counseling him about his currently uncontrolled and symptomatic type 2 diabetes, heavy smoking, and developing developing  plans for long term treatment based on the latest recommendations.  Stephen Palmer participated in the discussions, expressed understanding, and voiced agreement with the above plans.  All questions were answered to his satisfaction. he is encouraged to contact clinic should he have any questions or concerns prior to his return visit.  Follow up plan: - Return in about 10 days (around 09/15/2018) for Follow up with Meter and Logs Only - no Labs.  Glade Lloyd, MD [facility] [address] [place], [place] [address] Phone: [phone]  Fax: [phone]    09/05/2018, 5:40 PM  This note was partially dictated with voice recognition software. Similar sounding words can be transcribed inadequately or may not  be corrected upon review.

## 2018-09-05 NOTE — Patient Instructions (Signed)

## 2018-09-24 ENCOUNTER — Ambulatory Visit (INDEPENDENT_AMBULATORY_CARE_PROVIDER_SITE_OTHER): Payer: Medicare Other | Admitting: "Endocrinology

## 2018-09-24 ENCOUNTER — Encounter: Payer: Self-pay | Admitting: "Endocrinology

## 2018-09-24 VITALS — BP 129/81 | HR 82 | Ht 70.0 in | Wt 168.0 lb

## 2018-09-24 DIAGNOSIS — E1159 Type 2 diabetes mellitus with other circulatory complications: Secondary | ICD-10-CM | POA: Diagnosis not present

## 2018-09-24 DIAGNOSIS — F17200 Nicotine dependence, unspecified, uncomplicated: Secondary | ICD-10-CM | POA: Diagnosis not present

## 2018-09-24 DIAGNOSIS — I2510 Atherosclerotic heart disease of native coronary artery without angina pectoris: Secondary | ICD-10-CM | POA: Diagnosis not present

## 2018-09-24 NOTE — Patient Instructions (Signed)

## 2018-09-24 NOTE — Progress Notes (Signed)
Endocrinology follow-up Note       09/25/2018, 9:34 AM   Subjective:    Patient ID: Stephen Palmer, male    DOB: [birth year].  Stephen Palmer is being seen in consultation for management of currently uncontrolled symptomatic type 2 diabetes requested by  [name], PA-C.   Past Medical History:  Diagnosis Date  . Anxiety   . Aortic aneurysm (HCC)    4.4 will have intervention when 5.5  . COPD (chronic obstructive pulmonary disease) ([place])   . Diabetes mellitus without complication ([place])   . Hypertension   . Prostate cancer [facility])    Past Surgical History:  Procedure Laterality Date  . COLONOSCOPY N/A 11/23/2015   Procedure: COLONOSCOPY;  Surgeon: Aviva Signs, MD;  Location: [facility];  Service: Gastroenterology;  Laterality: N/A;  . FRACTURE SURGERY    . PROSTATE BIOPSY    . SINUS SURGERY WITH INSTATRAK     Social History   Socioeconomic History  . Marital status: Married    Spouse name: Not on file  . Number of children: Not on file  . Years of education: Not on file  . Highest education level: Not on file  Occupational History  . Not on file  Social Needs  . Financial resource strain: Not on file  . Food insecurity:    Worry: Not on file    Inability: Not on file  . Transportation needs:    Medical: Not on file    Non-medical: Not on file  Tobacco Use  . Smoking status: Current Every Day Smoker    Packs/day: 2.00    Years: 44.00    Pack years: 88.00    Types: Cigarettes  . Smokeless tobacco: Never Used  Substance and Sexual Activity  . Alcohol use: No    Alcohol/week: 0.0 standard drinks  . Drug use: No  . Sexual activity: Never  Lifestyle  . Physical activity:    Days per week: Not on file    Minutes per session: Not on file  . Stress: Not on file  Relationships  . Social connections:    Talks on phone: Not on file    Gets together: Not on file    Attends  religious service: Not on file    Active member of club or organization: Not on file    Attends meetings of clubs or organizations: Not on file    Relationship status: Not on file  Other Topics Concern  . Not on file  Social History Narrative  . Not on file   Outpatient Encounter Medications as of 09/24/2018  Medication Sig  . Albuterol (VENTOLIN IN) Inhale into the lungs every 8 (eight) hours as needed.   Marland Kitchen alprazolam (XANAX) 2 MG tablet Take 2 mg by mouth 2 (two) times daily.  . cyclobenzaprine (FLEXERIL) 10 MG tablet Take 10 mg by mouth 3 (three) times daily as needed.  . DULoxetine (CYMBALTA) 30 MG capsule Take 30 mg by mouth daily.  Marland Kitchen glipiZIDE (GLUCOTROL) 10 MG tablet Take 10 mg by mouth 2 (two) times daily before a meal.  . Leuprolide Acetate, 3 Month, (  LUPRON DEPOT, 4-MONTH, IM) Inject into the muscle.  . tamsulosin (FLOMAX) 0.4 MG CAPS capsule   . vitamin C (ASCORBIC ACID) 500 MG tablet Take 500 mg by mouth daily.   No facility-administered encounter medications on file as of 09/24/2018.     ALLERGIES: Allergies  Allergen Reactions  . Sulfa Antibiotics Hives, Itching and Swelling    Tongue swelling  . Sulfasalazine Hives, Itching and Swelling    Tongue swelling    VACCINATION STATUS:  There is no immunization history on file for this patient.  Diabetes  He presents for his follow-up diabetic visit. He has type 2 diabetes mellitus. Onset time: He was diagnosed at approximate age of 50 years. His disease course has been improving. There are no hypoglycemic associated symptoms. Pertinent negatives for hypoglycemia include no confusion, headaches, pallor or seizures. Associated symptoms include blurred vision, polydipsia and polyuria. Pertinent negatives for diabetes include no chest pain, no fatigue, no polyphagia and no weakness. There are no hypoglycemic complications. Symptoms are improving. Diabetic complications include a CVA. Risk factors for coronary artery disease  include diabetes mellitus, hypertension, male sex and tobacco exposure. Current diabetic treatment includes oral agent (monotherapy) (Is currently taking glipizide 10 mg p.o. twice daily.). His weight is stable. He is following a generally unhealthy diet. When asked about meal planning, he reported none. He has not had a previous visit with a dietitian. He never participates in exercise. His breakfast blood glucose range is generally 130-140 mg/dl. His lunch blood glucose range is generally 140-180 mg/dl. His dinner blood glucose range is generally 140-180 mg/dl. His bedtime blood glucose range is generally 140-180 mg/dl. His overall blood glucose range is 140-180 mg/dl. (He did not bring any meter nor logs to review.  His recent A1c was reported to be 8.4%. ) An ACE inhibitor/angiotensin II receptor blocker is not being taken. Eye exam is current.     Review of Systems  Constitutional: Negative for chills, fatigue, fever and unexpected weight change.  HENT: Negative for dental problem, mouth sores and trouble swallowing.   Eyes: Positive for blurred vision. Negative for visual disturbance.  Respiratory: Positive for cough and shortness of breath. Negative for choking, chest tightness and wheezing.   Cardiovascular: Negative for chest pain, palpitations and leg swelling.  Gastrointestinal: Negative for abdominal distention, abdominal pain, constipation, diarrhea, nausea and vomiting.  Endocrine: Positive for polydipsia and polyuria. Negative for polyphagia.  Genitourinary: Negative for dysuria, flank pain, hematuria and urgency.  Musculoskeletal: Negative for back pain, gait problem, myalgias and neck pain.  Skin: Negative for pallor, rash and wound.  Neurological: Negative for seizures, syncope, weakness, numbness and headaches.  Psychiatric/Behavioral: Negative for confusion and dysphoric mood.    Objective:    BP 129/81   Pulse 82   Ht 5\' 10"  (1.778 m)   Wt 168 lb (76.2 kg)   BMI 24.11  kg/m   Wt Readings from Last 3 Encounters:  09/24/18 168 lb (76.2 kg)  09/05/18 169 lb (76.7 kg)  08/15/18 168 lb (76.2 kg)     Physical Exam  Constitutional: He is oriented to person, place, and time. He appears well-developed and well-nourished. He is cooperative. No distress.  HENT:  Head: Normocephalic and atraumatic.  Eyes: EOM are normal.  Neck: Normal range of motion. Neck supple. No tracheal deviation present. No thyromegaly present.  Cardiovascular: S1 normal and S2 normal. Exam reveals no gallop.  No murmur heard. Pulses:      Dorsalis pedis pulses are 1+ on  the right side, and 1+ on the left side.       Posterior tibial pulses are 1+ on the right side, and 1+ on the left side.  Pulmonary/Chest: Effort normal. No respiratory distress. He has no wheezes.  Abdominal: He exhibits no distension. There is no tenderness. There is no guarding and no CVA tenderness.  Musculoskeletal: He exhibits no edema.       Right shoulder: He exhibits no swelling and no deformity.  Neurological: He is alert and oriented to person, place, and time. He has normal strength and normal reflexes. No cranial nerve deficit or sensory deficit. Gait normal.  Skin: Skin is warm and dry. No rash noted. No cyanosis. Nails show no clubbing.  Psychiatric: He has a normal mood and affect. His speech is normal. Judgment normal. Cognition and memory are normal.  Patient has reluctant affect.    Recent Results (from the past 2160 hour(s))  Microalbumin, urine     Status: None   Collection Time: 07/09/18 12:00 AM  Result Value Ref Range   Microalb, Ur 150   VITAMIN D 25 Hydroxy (Vit-D Deficiency, Fractures)     Status: None   Collection Time: 07/09/18 12:00 AM  Result Value Ref Range   Vit D, 25-Hydroxy 24.2   Basic metabolic panel     Status: None   Collection Time: 07/09/18 12:00 AM  Result Value Ref Range   BUN 10 4 - 21   Creatinine 0.9 0.6 - 1.3   Potassium 4.8 3.4 - 5.3   Sodium 137 137 - 147   Lipid panel     Status: Abnormal   Collection Time: 07/09/18 12:00 AM  Result Value Ref Range   Triglycerides 244 (A) 40 - 160   Cholesterol 275 (A) 0 - 200   HDL 42 35 - 70   LDL Cholesterol 184   Hepatic function panel     Status: None   Collection Time: 07/09/18 12:00 AM  Result Value Ref Range   ALT 18 10 - 40   AST 23 14 - 40  Hemoglobin A1c     Status: None   Collection Time: 07/09/18 12:00 AM  Result Value Ref Range   Hemoglobin A1C 11.5   TSH     Status: None   Collection Time: 07/09/18 12:00 AM  Result Value Ref Range   TSH 2.02 0.41 - 6.83  Basic metabolic panel     Status: None   Collection Time: 07/09/18 12:00 AM  Result Value Ref Range   BUN 17 4 - 21   Creatinine 1.1 0.6 - 1.3  Lipid panel     Status: Abnormal   Collection Time: 07/09/18 12:00 AM  Result Value Ref Range   Triglycerides 187 (A) 40 - 160   Cholesterol 225 (A) 0 - 200   HDL 36 35 - 70   LDL Cholesterol 152   Hemoglobin A1c     Status: None   Collection Time: 07/09/18 12:00 AM  Result Value Ref Range   Hemoglobin A1C 8.1   TSH     Status: None   Collection Time: 07/09/18 12:00 AM  Result Value Ref Range   TSH 1.82 0.41 - 5.90  NM Myocar Multi W/Spect W/Wall Motion / EF     Status: None   Collection Time: 08/22/18 12:23 PM  Result Value Ref Range   Rest HR 78 bpm   Rest BP 131/81 mmHg   Peak HR 97 bpm   Peak BP 131/81  mmHg   SSS 5    SRS 2    SDS 3    LHR 0.36    TID 0.94    LV sys vol 13 mL   LV dias vol 44 62 - 150 mL      Assessment & Plan:   1. DM type 2 causing vascular disease ([place])  - Stephen Palmer has currently uncontrolled symptomatic type 2 DM since 67 years of age. -He presents with near target glycemic profile averaging between 100-150, and recent A1c of 8.1% progressively improving from 11.5%.  -Recent labs are reviewed with him.   -his diabetes is complicated by CVA , chronic heavy smoking, and he remains at extremely high risk for more acute and chronic  complications which include CAD, CVA, CKD, retinopathy, and neuropathy. These are all discussed in detail with him.  - I have counseled him on diet management by adopting a carbohydrate restricted/protein rich diet.  -  Suggestion is made for him to avoid simple carbohydrates  from his diet including Cakes, Sweet Desserts / Pastries, Ice Cream, Soda (diet and regular), Sweet Tea, Candies, Chips, Cookies, Store Bought Juices, Alcohol in Excess of  1-2 drinks a day, Artificial Sweeteners, and "Sugar-free" Products. This will help patient to have stable blood glucose profile and potentially avoid unintended weight gain.   - I encouraged him to switch to  unprocessed or minimally processed complex starch and increased protein intake (animal or plant source), fruits, and vegetables.  - he is advised to stick to a routine mealtimes to eat 3 meals  a day and avoid unnecessary snacks ( to snack only to correct hypoglycemia).    - I have approached him with the following individualized plan to manage diabetes and patient agrees:   -Based on his presentation with near target glycemic profile and his clear reluctance to go on insulin, he will be kept on non-insulin treatment for now.   -He is advised to continue glipizide 10 mg p.o. twice daily with breakfast and supper, continue monitoring blood glucose at least 2 times daily-before breakfast and before supper and at any other time as needed. - he is encouraged to call clinic for blood glucose levels less than 70 or above 300 mg /dl.  -He is not a suitable candidate for metformin, SGLT2 inhibitors, nor incretin therapy. -He will be reapproached for insulin treatment if he loses control by next visit.  - Patient specific target  A1c;  LDL, HDL, Triglycerides, and  Waist Circumference were discussed in detail.  2) BP/HTN:  his blood pressure is controlled to target.  He is not on any antihypertensive medications, reported sulfa allergy.   3)  Lipids/HPL: No recent lipid panel to review.  Patient is not on statins.   4)  Weight/Diet:  Body mass index is 24.11 kg/m.   Weight loss is not advisable for him.  CDE Consult will be initiated . Exercise, and detailed carbohydrates information provided  -  detailed on discharge instructions.  5) Chronic Care/Health Maintenance:  -he  Is not on ACEI/ARB and Statin medications and  is encouraged to initiate and continue to follow up with Ophthalmology, Dentist,  Podiatrist at least yearly or according to recommendations, and advised to  Quit smoking (this is his health risk). I have recommended yearly flu vaccine and pneumonia vaccine at least every 5 years; moderate intensity exercise for up to 150 minutes weekly; and  sleep for at least 7 hours a day.  - I advised  patient to maintain close follow up with [name], PA-C for primary care needs.  - Time spent with the patient: 25 min, of which >50% was spent in reviewing his blood glucose logs , discussing his hypo- and hyper-glycemic episodes, reviewing his current and  previous labs and insulin doses and developing a plan to avoid hypo- and hyper-glycemia. Please refer to Patient Instructions for Blood Glucose Monitoring and Insulin/Medications Dosing Guide"  in media tab for additional information. Stephen Palmer participated in the discussions, expressed understanding, and voiced agreement with the above plans.  All questions were answered to his satisfaction. he is encouraged to contact clinic should he have any questions or concerns prior to his return visit.   Follow up plan: - Return in about 3 months (around 12/25/2018) for Follow up with Pre-visit Labs, Meter, and Logs.  Glade Lloyd, MD [facility] [address] [place], [place] [address] Phone: [phone]  Fax: [phone]    09/25/2018, 9:34 AM  This note was partially dictated with voice recognition software.  Similar sounding words can be transcribed inadequately or may not  be corrected upon review.

## 2018-10-08 DIAGNOSIS — E1165 Type 2 diabetes mellitus with hyperglycemia: Secondary | ICD-10-CM | POA: Diagnosis not present

## 2018-10-08 DIAGNOSIS — R945 Abnormal results of liver function studies: Secondary | ICD-10-CM | POA: Diagnosis not present

## 2018-10-08 DIAGNOSIS — Z6825 Body mass index (BMI) 25.0-25.9, adult: Secondary | ICD-10-CM | POA: Diagnosis not present

## 2018-10-08 DIAGNOSIS — K7689 Other specified diseases of liver: Secondary | ICD-10-CM | POA: Diagnosis not present

## 2018-10-08 DIAGNOSIS — E663 Overweight: Secondary | ICD-10-CM | POA: Diagnosis not present

## 2018-10-30 ENCOUNTER — Encounter: Payer: Medicare Other | Attending: Family Medicine | Admitting: Nutrition

## 2018-10-30 VITALS — Ht 70.0 in | Wt 166.0 lb

## 2018-10-30 DIAGNOSIS — E118 Type 2 diabetes mellitus with unspecified complications: Secondary | ICD-10-CM

## 2018-10-30 DIAGNOSIS — C61 Malignant neoplasm of prostate: Secondary | ICD-10-CM | POA: Diagnosis not present

## 2018-10-30 DIAGNOSIS — E782 Mixed hyperlipidemia: Secondary | ICD-10-CM

## 2018-10-30 DIAGNOSIS — E1165 Type 2 diabetes mellitus with hyperglycemia: Secondary | ICD-10-CM

## 2018-10-30 DIAGNOSIS — E1159 Type 2 diabetes mellitus with other circulatory complications: Secondary | ICD-10-CM | POA: Diagnosis not present

## 2018-10-30 DIAGNOSIS — IMO0002 Reserved for concepts with insufficient information to code with codable children: Secondary | ICD-10-CM

## 2018-10-30 NOTE — Patient Instructions (Addendum)
Goals 1. Follow My Plate-eat three meals per day 2. Increase fresh fruits and vegetables. Eat 2-3 carbs per meal 3. Walk 30 minutes every day Don't skip meals.

## 2018-10-30 NOTE — Progress Notes (Signed)
  Medical Nutrition Therapy:  Appt start time: 1330 end time:  1430.   Assessment:  Primary concerns today: Diabetes Type 2. PMH: Hyperlipidemia, HTN.  Here with his wife. Had DM for 5 years. Glipizide 10 mg BID. Sees Dr. Dorris Fetch for his diabetes. Eats 2 meals per day. Had a stroke last June 2018. BS occasionally drops due to missing meals.  >A1C 7.3%.  Testing blood sugars twice  105- 130's.  Before bed: 90-100's.  Working on eating better, timing of meals and portions. He skips meals. Current diet is insuffient to meet his needs.   Lab Results  Component Value Date   HGBA1C 11.5 07/09/2018   HGBA1C 8.1 07/09/2018   CMP Latest Ref Rng & Units 07/09/2018 07/09/2018 05/13/2012  Glucose 70 - 99 mg/dL - - 165(H)  BUN 4 - 21 17 10  24(H)  Creatinine 0.6 - 1.3 1.1 0.9 1.01  Sodium 137 - 147 - 137 131(L)  Potassium 3.4 - 5.3 - 4.8 3.2(L)  Chloride 96 - 112 mEq/L - - 95(L)  CO2 19 - 32 mEq/L - - 25  Calcium 8.4 - 10.5 mg/dL - - 9.7  AST 14 - 40 - 23 -  ALT 10 - 40 - 18 -   Lipid Panel     Component Value Date/Time   CHOL 275 (A) 07/09/2018   CHOL 225 (A) 07/09/2018   TRIG 244 (A) 07/09/2018   TRIG 187 (A) 07/09/2018   HDL 42 07/09/2018   HDL 36 07/09/2018   LDLCALC 184 07/09/2018   LDLCALC 152 07/09/2018     Preferred Learning Style:   No preference indicated   Learning Readiness:  Not ready  Contemplating  Ready  Change in progress   MEDICATIONS:   DIETARY INTAKE: 24-hr recall:  B ( AM): skips mostly   Snk ( AM):  L ( PM):  Skipped today: 3 eggs, 1 sausage and 1 slice ww toast. water Snk ( PM): D ( PM): Vegetable beef soup and grilled cheese sandwich, water Snk ( PM): Banana Sandwich, water Beverages: water  Usual physical activity: ADL  Estimated energy needs: 1800-200- calories 200  g carbohydrates 135 g protein 50 g fat  Progress Towards Goal(s):  In progress.   Nutritional Diagnosis:  NB-1.1 Food and nutrition-related knowledge deficit As related  to Diabetes and Hyperlipidemia.  As evidenced by A1C 7.3% and elevated lipd levels..    Intervention:  Nutrition and Diabetes education provided on My Plate, CHO counting, meal planning, portion sizes, timing of meals, avoiding snacks between meals unless having a low blood sugar, target ranges for A1C and blood sugars, signs/symptoms and treatment of hyper/hypoglycemia, monitoring blood sugars, taking medications as prescribed, benefits of exercising 30 minutes per day and prevention of complications of DM.  Goals 1. Follow My Plate-eat three meals per day 2. Increase fresh fruits and vegetables. Eat 2-3 carbs per meal 3. Walk 30 minutes every day Don't skip meals.    Teaching Method Utilized:  Visual Auditory Hands on  Handouts given during visit include:  The Plate Method   Meal Plan Card  Barriers to learning/adherence to lifestyle change: none  Demonstrated degree of understanding via:  Teach Back   Monitoring/Evaluation:  Dietary intake, exercise, meal planning , and body weight in 1 month(s).

## 2018-11-04 ENCOUNTER — Ambulatory Visit: Payer: Medicare Other | Admitting: Cardiovascular Disease

## 2018-11-06 ENCOUNTER — Ambulatory Visit (INDEPENDENT_AMBULATORY_CARE_PROVIDER_SITE_OTHER): Payer: Medicare Other | Admitting: Urology

## 2018-11-06 DIAGNOSIS — C61 Malignant neoplasm of prostate: Secondary | ICD-10-CM | POA: Diagnosis not present

## 2018-11-06 DIAGNOSIS — R351 Nocturia: Secondary | ICD-10-CM | POA: Diagnosis not present

## 2018-11-13 ENCOUNTER — Encounter: Payer: Self-pay | Admitting: Nutrition

## 2018-11-25 ENCOUNTER — Other Ambulatory Visit: Payer: Self-pay

## 2018-11-25 DIAGNOSIS — I714 Abdominal aortic aneurysm, without rupture, unspecified: Secondary | ICD-10-CM

## 2018-11-25 DIAGNOSIS — F17200 Nicotine dependence, unspecified, uncomplicated: Secondary | ICD-10-CM

## 2018-12-02 ENCOUNTER — Ambulatory Visit
Admission: RE | Admit: 2018-12-02 | Discharge: 2018-12-02 | Disposition: A | Discharge status: Home / Self Care | Payer: Medicare Other | Source: Ambulatory Visit | Attending: Vascular Surgery | Admitting: Vascular Surgery

## 2018-12-02 ENCOUNTER — Other Ambulatory Visit: Payer: Medicare Other

## 2018-12-02 ENCOUNTER — Inpatient Hospital Stay: Admission: RE | Admit: 2018-12-02 | Payer: Medicare Other | Source: Ambulatory Visit

## 2018-12-02 DIAGNOSIS — F17200 Nicotine dependence, unspecified, uncomplicated: Secondary | ICD-10-CM

## 2018-12-02 DIAGNOSIS — I714 Abdominal aortic aneurysm, without rupture, unspecified: Secondary | ICD-10-CM

## 2018-12-02 MED ORDER — IOPAMIDOL (ISOVUE-370) INJECTION 76%
75.0000 mL | Freq: Once | INTRAVENOUS | Status: AC | PRN
  Administered 2018-12-02: 75 mL via INTRAVENOUS

## 2018-12-17 ENCOUNTER — Ambulatory Visit
Admission: RE | Admit: 2018-12-17 | Discharge: 2018-12-17 | Disposition: A | Discharge status: Home / Self Care | Payer: Medicare Other | Source: Ambulatory Visit | Attending: Urology | Admitting: Urology

## 2018-12-17 ENCOUNTER — Encounter: Payer: Self-pay | Admitting: Urology

## 2018-12-17 ENCOUNTER — Other Ambulatory Visit: Payer: Self-pay

## 2018-12-17 VITALS — BP 140/95 | HR 96 | Temp 97.8°F | Resp 20 | Wt 169.4 lb

## 2018-12-17 DIAGNOSIS — Z08 Encounter for follow-up examination after completed treatment for malignant neoplasm: Secondary | ICD-10-CM | POA: Diagnosis not present

## 2018-12-17 DIAGNOSIS — C3492 Malignant neoplasm of unspecified part of left bronchus or lung: Secondary | ICD-10-CM

## 2018-12-17 DIAGNOSIS — C3412 Malignant neoplasm of upper lobe, left bronchus or lung: Secondary | ICD-10-CM | POA: Insufficient documentation

## 2018-12-17 DIAGNOSIS — Z79899 Other long term (current) drug therapy: Secondary | ICD-10-CM | POA: Insufficient documentation

## 2018-12-17 DIAGNOSIS — Z85118 Personal history of other malignant neoplasm of bronchus and lung: Secondary | ICD-10-CM | POA: Diagnosis not present

## 2018-12-17 NOTE — Progress Notes (Signed)
Radiation Oncology         [phone]) [phone] ________________________________  Name: Stephen Palmer MRN: [id number]  Date: 12/17/2018  DOB: 05-Jun-1951  Post Treatment Note  CC: [name], PA-C  [name], MD  Diagnosis:    67 y.o. male with 3 cm squamous cell carcinoma of the left upper lobe of the lung - Stage IA     Interval Since Last Radiation:  1 year, 2 months  Curative, Definitive SBRT:  10/05/2017, 10/09/2017, 10/12/2017- The target was treated to 54 Gy in 3 fractions of 18 Gy.  12/13/16 - 02/09/17: Prostate IMRT ; 40 fractions to 78 Gy in combination with LT-ADT under the care of Dr. Lianne Cure in [place], [place].  Narrative:  The patient returns today for routine follow-up.  He has recovered well from the effects of radiotherapy and is currently without complaints.  He had a recent CTA Chest on 12/02/18 as part of a workup for evaluation of flu-like sxs and left sided chest pain.  This showed a grossly stable appearance of the previously treated left upper lobe nodule, now measuring 10 x 11 mm.  There was no evidence of disease progression or metastatic disease.  Fortunately, all of his testing for cardiac evaluation turned out normal and he continues in routine follow-up with his vascular specialist in [facility] for monitoring of the known AAA which has reportedly remained stable.                             On review of systems, the patient states that he is doing well overall. He continues with a mild, occasional productive cough with clear to whitish sputum. He specifically denies hemoptysis, increased shortness of breath, chest pain, fevers, chills or night sweats.  He continues with mild fatigue which is not progressively worseining.  He admits to having a very sedentary lifestyle despite encouragement from his wife and endocrinologist to be more active.  He denies abdominal pain, N/V or diarrhea.  He has a healthy appetite and is maintaining his weight.  ALLERGIES:  is allergic to  sulfa antibiotics and sulfasalazine.  Meds: Current Outpatient Medications  Medication Sig Dispense Refill  . Albuterol (VENTOLIN IN) Inhale into the lungs every 8 (eight) hours as needed.     Marland Kitchen alprazolam (XANAX) 2 MG tablet Take 2 mg by mouth 2 (two) times daily.    . cyclobenzaprine (FLEXERIL) 10 MG tablet Take 10 mg by mouth 3 (three) times daily as needed.    . DULoxetine (CYMBALTA) 30 MG capsule Take 30 mg by mouth daily.    Marland Kitchen glipiZIDE (GLUCOTROL) 10 MG tablet Take 10 mg by mouth 2 (two) times daily before a meal.    . Multiple Vitamins-Minerals (CENTRUM SILVER 50+MEN PO) Take by mouth every morning.    . tamsulosin (FLOMAX) 0.4 MG CAPS capsule     . vitamin C (ASCORBIC ACID) 500 MG tablet Take 500 mg by mouth daily.    . vitamin E 1000 UNIT capsule Take 1,000 Units by mouth daily.    Marland Kitchen Leuprolide Acetate, 3 Month, (LUPRON DEPOT, 34-MONTH, IM) Inject into the muscle.     No current facility-administered medications for this encounter.     Physical Findings:  weight is 169 lb 6 oz (76.8 kg). His temperature is 97.8 F (36.6 C). His blood pressure is 140/95 (abnormal) and his pulse is 96. His respiration is 20 and oxygen saturation is 96%.  Pain  Assessment Pain Score: 0-No pain/10 In general this is a well appearing Caucasian male in no acute distress. He's alert and oriented x4 and appropriate throughout the examination. Cardiopulmonary assessment is negative for acute distress and he exhibits normal effort.   Lab Findings: Lab Results  Component Value Date   WBC 25.7 (H) 05/13/2012   HGB 12.6 (L) 05/13/2012   HCT 37.0 (L) 05/13/2012   MCV 89.8 05/13/2012   PLT 245 05/13/2012     Radiographic Findings: Ct Angio Chest Aorta W &/or Wo Contrast  Result Date: 12/02/2018 CLINICAL DATA:  Abdominal aortic aneurysm EXAM: CT ANGIOGRAPHY CHEST, ABDOMEN AND PELVIS TECHNIQUE: Multidetector CT imaging through the chest, abdomen and pelvis was performed using the standard protocol  during bolus administration of intravenous contrast. Multiplanar reconstructed images and MIPs were obtained and reviewed to evaluate the vascular anatomy. CONTRAST:  75mL ISOVUE-370 IOPAMIDOL (ISOVUE-370) INJECTION 76% Creatinine was obtained on site at [place] at [address] Results: Creatinine 1.1 mg/dL. COMPARISON:  06/19/2018, 08/06/2018 FINDINGS: CTA CHEST FINDINGS Cardiovascular: Stable heterogeneous and irregular atherosclerotic plaque formation of the thoracic aorta, most pronounced of the thoracic arch and descending aorta. Stable ectasia of the ascending thoracic aorta, maximal diameter 3.3 cm, image 99 series 5. Patent 3 vessel arch anatomy. No acute dissection, intramural hemorrhage or hematoma. No mediastinal hemorrhage or hematoma. Normal heart size.  Native coronary atherosclerosis noted. Visualized central pulmonary arteries are also patent. No significant filling defect or pulmonary embolus demonstrated. Mediastinum/Nodes: No enlarged mediastinal, hilar, or axillary lymph nodes. Thyroid gland, trachea, and esophagus demonstrate no significant findings. Lungs/Pleura: Stable emphysema with bullous and centrilobular components, most pronounced in the upper lobes. Anterior left upper lobe spiculated nodule appears grossly stable measuring 10 x 11 mm, images 68 and 69, series 7. Adjacent to the nodule, there is increased bandlike atelectasis or scarring. No superimposed acute airspace process, pneumonia, significant collapse or consolidation. No edema or interstitial process. No other pleural abnormality, effusion, or pneumothorax. Musculoskeletal: Degenerative changes noted spine. No acute osseous finding. Intact sternum. No compression fracture. Review of the MIP images confirms the above findings. CTA ABDOMEN AND PELVIS FINDINGS VASCULAR Aorta: Extensive atherosclerotic change of the abdominal. Similar fusiform abdominal aortic aneurysm beginning in the suprarenal aorta extending  to the bifurcation. Measuring at similar levels, maximal AP aneurysm diameter 5.2 cm and transverse diameter 5.0 cm, image 199 series 5. Overall stable appearance. No interval dissection, no perianeurysmal inflammatory change, evidence of rupture, retroperitoneal hemorrhage. Celiac: Atherosclerotic origin but remains patent including its branches SMA: Atherosclerotic origin but remains patent including its branches Renals: Atherosclerotic origin but remains patent including accessory renal arteries bilaterally. IMA: Occluded off the distal aorta above reconstituted via SMA collaterals. This was better demonstrated on the prior study. Inflow: Mildly ectatic and tortuous iliac vessels. The common, internal and external iliac arteries remain patent bilaterally. No inflow disease. Veins: No significant veno-occlusive process. Review of the MIP images confirms the above findings. NON-VASCULAR Hepatobiliary: No focal liver abnormality is seen. No gallstones, gallbladder wall thickening, or biliary dilatation. Pancreas: Unremarkable. No pancreatic ductal dilatation or surrounding inflammatory changes. Spleen: Normal in size without focal abnormality. Adrenals/Urinary Tract: Normal adrenal glands for age. Stable right renal cyst measuring 4.1 cm. No renal obstruction or hydronephrosis. Ureters are symmetric and decompressed. No hydroureter or ureteral calculus. Bladder unremarkable. Stomach/Bowel: Negative for bowel obstruction, significant dilatation, ileus, or free air. Normal appendix demonstrated. Rectosigmoid diverticulosis without acute inflammatory process or wall thickening. No fluid collection or abscess. Negative  for ascites. Lymphatic: No adenopathy. Reproductive: No significant finding by CT. Other: No abdominal wall hernia or abnormality. No abdominopelvic ascites. Musculoskeletal: Degenerative changes noted spine and pelvis. No acute osseous finding or fracture. Review of the MIP images confirms the above  findings. IMPRESSION: Stable thoracic aortic ectasia and atherosclerotic changes, maximal thoracic aortic diameter 3.3 cm proximally. No acute vascular process within the chest. No significant acute pulmonary embolus Stable spiculated 10 x 11 mm anterior left upper lobe nodule No superimposed acute chest process Stable atherosclerotic fusiform abdominal aortic aneurysm, maximal diameter 5.2 cm, grossly unchanged. No acute vascular process within the abdomen. No other acute intra-abdominopelvic finding. Stable right renal cyst Diverticulosis without acute inflammatory process Electronically Signed   By: Jerilynn Mages.  Shick M.D.   On: 12/02/2018 15:44   Ct Angio Abdomen Pelvis  W &/or Wo Contrast  Result Date: 12/02/2018 CLINICAL DATA:  Abdominal aortic aneurysm EXAM: CT ANGIOGRAPHY CHEST, ABDOMEN AND PELVIS TECHNIQUE: Multidetector CT imaging through the chest, abdomen and pelvis was performed using the standard protocol during bolus administration of intravenous contrast. Multiplanar reconstructed images and MIPs were obtained and reviewed to evaluate the vascular anatomy. CONTRAST:  7mL ISOVUE-370 IOPAMIDOL (ISOVUE-370) INJECTION 76% Creatinine was obtained on site at [place] at [address] Results: Creatinine 1.1 mg/dL. COMPARISON:  06/19/2018, 08/06/2018 FINDINGS: CTA CHEST FINDINGS Cardiovascular: Stable heterogeneous and irregular atherosclerotic plaque formation of the thoracic aorta, most pronounced of the thoracic arch and descending aorta. Stable ectasia of the ascending thoracic aorta, maximal diameter 3.3 cm, image 99 series 5. Patent 3 vessel arch anatomy. No acute dissection, intramural hemorrhage or hematoma. No mediastinal hemorrhage or hematoma. Normal heart size.  Native coronary atherosclerosis noted. Visualized central pulmonary arteries are also patent. No significant filling defect or pulmonary embolus demonstrated. Mediastinum/Nodes: No enlarged mediastinal, hilar, or axillary  lymph nodes. Thyroid gland, trachea, and esophagus demonstrate no significant findings. Lungs/Pleura: Stable emphysema with bullous and centrilobular components, most pronounced in the upper lobes. Anterior left upper lobe spiculated nodule appears grossly stable measuring 10 x 11 mm, images 68 and 69, series 7. Adjacent to the nodule, there is increased bandlike atelectasis or scarring. No superimposed acute airspace process, pneumonia, significant collapse or consolidation. No edema or interstitial process. No other pleural abnormality, effusion, or pneumothorax. Musculoskeletal: Degenerative changes noted spine. No acute osseous finding. Intact sternum. No compression fracture. Review of the MIP images confirms the above findings. CTA ABDOMEN AND PELVIS FINDINGS VASCULAR Aorta: Extensive atherosclerotic change of the abdominal. Similar fusiform abdominal aortic aneurysm beginning in the suprarenal aorta extending to the bifurcation. Measuring at similar levels, maximal AP aneurysm diameter 5.2 cm and transverse diameter 5.0 cm, image 199 series 5. Overall stable appearance. No interval dissection, no perianeurysmal inflammatory change, evidence of rupture, retroperitoneal hemorrhage. Celiac: Atherosclerotic origin but remains patent including its branches SMA: Atherosclerotic origin but remains patent including its branches Renals: Atherosclerotic origin but remains patent including accessory renal arteries bilaterally. IMA: Occluded off the distal aorta above reconstituted via SMA collaterals. This was better demonstrated on the prior study. Inflow: Mildly ectatic and tortuous iliac vessels. The common, internal and external iliac arteries remain patent bilaterally. No inflow disease. Veins: No significant veno-occlusive process. Review of the MIP images confirms the above findings. NON-VASCULAR Hepatobiliary: No focal liver abnormality is seen. No gallstones, gallbladder wall thickening, or biliary dilatation.  Pancreas: Unremarkable. No pancreatic ductal dilatation or surrounding inflammatory changes. Spleen: Normal in size without focal abnormality. Adrenals/Urinary Tract: Normal adrenal glands for age.  Stable right renal cyst measuring 4.1 cm. No renal obstruction or hydronephrosis. Ureters are symmetric and decompressed. No hydroureter or ureteral calculus. Bladder unremarkable. Stomach/Bowel: Negative for bowel obstruction, significant dilatation, ileus, or free air. Normal appendix demonstrated. Rectosigmoid diverticulosis without acute inflammatory process or wall thickening. No fluid collection or abscess. Negative for ascites. Lymphatic: No adenopathy. Reproductive: No significant finding by CT. Other: No abdominal wall hernia or abnormality. No abdominopelvic ascites. Musculoskeletal: Degenerative changes noted spine and pelvis. No acute osseous finding or fracture. Review of the MIP images confirms the above findings. IMPRESSION: Stable thoracic aortic ectasia and atherosclerotic changes, maximal thoracic aortic diameter 3.3 cm proximally. No acute vascular process within the chest. No significant acute pulmonary embolus Stable spiculated 10 x 11 mm anterior left upper lobe nodule No superimposed acute chest process Stable atherosclerotic fusiform abdominal aortic aneurysm, maximal diameter 5.2 cm, grossly unchanged. No acute vascular process within the abdomen. No other acute intra-abdominopelvic finding. Stable right renal cyst Diverticulosis without acute inflammatory process Electronically Signed   By: Jerilynn Mages.  Shick M.D.   On: 12/02/2018 15:44    Impression/Plan: 1.  68 y.o. 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    The patient continues to remain stable both physically and radiographically. We will continue with serial CT scans of the chest with contrast and BMP  performed at [facility] since this is closer to home in [place], [place] at six month  intervals until 5 years when, at that point in time, he will have an annual low dose CT scan for surveillance. He will return in 6 months to review the findings from the scan to be ordered prior to that visit. He knows to call with any questions or concerns in the interim.  He is comfortable with this plan.    [name], PA-C

## 2018-12-20 DIAGNOSIS — E1159 Type 2 diabetes mellitus with other circulatory complications: Secondary | ICD-10-CM | POA: Diagnosis not present

## 2018-12-21 LAB — HEMOGLOBIN A1C
HEMOGLOBIN A1C: 7.1 %{Hb} — AB (ref <5.7)
MEAN PLASMA GLUCOSE: 157 (calc)
eAG (mmol/L): 8.7 (calc)

## 2018-12-21 LAB — COMPLETE METABOLIC PANEL WITH GFR
AG Ratio: 1.6 (calc) (ref 1.0–2.5)
ALBUMIN MSPROF: 4.6 g/dL (ref 3.6–5.1)
ALKALINE PHOSPHATASE (APISO): 101 U/L (ref 40–115)
ALT: 39 U/L (ref 9–46)
AST: 27 U/L (ref 10–35)
BUN: 16 mg/dL (ref 7–25)
CALCIUM: 9.8 mg/dL (ref 8.6–10.3)
CO2: 28 mmol/L (ref 20–32)
CREATININE: 1.15 mg/dL (ref 0.70–1.25)
Chloride: 100 mmol/L (ref 98–110)
GFR, EST NON AFRICAN AMERICAN: 65 mL/min/{1.73_m2} (ref >=60)
GFR, Est African American: 76 mL/min/{1.73_m2} (ref >=60)
GLUCOSE: 145 mg/dL — AB (ref 65–99)
Globulin: 2.9 g/dL (calc) (ref 1.9–3.7)
Potassium: 4.8 mmol/L (ref 3.5–5.3)
Sodium: 138 mmol/L (ref 135–146)
Total Bilirubin: 0.4 mg/dL (ref 0.2–1.2)
Total Protein: 7.5 g/dL (ref 6.1–8.1)

## 2018-12-24 DIAGNOSIS — C44319 Basal cell carcinoma of skin of other parts of face: Secondary | ICD-10-CM | POA: Diagnosis not present

## 2018-12-24 DIAGNOSIS — L821 Other seborrheic keratosis: Secondary | ICD-10-CM | POA: Diagnosis not present

## 2018-12-24 DIAGNOSIS — L570 Actinic keratosis: Secondary | ICD-10-CM | POA: Diagnosis not present

## 2018-12-24 DIAGNOSIS — D485 Neoplasm of uncertain behavior of skin: Secondary | ICD-10-CM | POA: Diagnosis not present

## 2018-12-25 ENCOUNTER — Ambulatory Visit (INDEPENDENT_AMBULATORY_CARE_PROVIDER_SITE_OTHER): Payer: Medicare Other | Admitting: Vascular Surgery

## 2018-12-25 ENCOUNTER — Encounter: Payer: Self-pay | Admitting: Vascular Surgery

## 2018-12-25 ENCOUNTER — Other Ambulatory Visit: Payer: Self-pay

## 2018-12-25 VITALS — BP 148/90 | HR 90 | Temp 97.2°F | Resp 20 | Ht 70.0 in | Wt 169.0 lb

## 2018-12-25 DIAGNOSIS — I714 Abdominal aortic aneurysm, without rupture, unspecified: Secondary | ICD-10-CM

## 2018-12-25 NOTE — Progress Notes (Signed)
Patient name: Stephen Palmer MRN: [id number] DOB: [birth year] Sex: male  REASON FOR VISIT:   Follow-up of type IV thoracoabdominal aneurysm.  HPI:   Stephen Palmer is a pleasant 68 y.o. male who I last saw on 06/19/2018.  At that time he continued to be heavy smoker and was smoking 2 packs/day.  His CT angiogram at that time showed that he had a type IV thoracoabdominal aneurysm.  The juxtarenal aneurysm measured 3.4 cm in maximum diameter the largest infrarenal component was 5.1 cm.  The aneurysm increased in size slightly in size from 4.7 to 5.1 cm.  I explained that in a normal risk patient we could consider elective repair at 5.5 cm.  However his situation was complicated by the fact that this was a type IV thoracoabdominal aneurysm.  If this enlarged I felt that he could be considered possibly for a fenestrated graft or a thoracoabdominal exposure for repair.  We discussed referral to [facility] and elected to have his follow-up study done here in 6 months.  Since I saw him last, he denies any significant abdominal pain or back pain.  He does have a history of prostate cancer but has had good reports on his PSA.  He also has a history of lung cancer and I think he is scheduled for a follow-up.  Future concerns.  He does continue to smoke 2 packs/day of cigarettes have discussed this with him on multiple occasions.  Past Medical History:  Diagnosis Date  . Anxiety   . Aortic aneurysm (HCC)    4.4 will have intervention when 5.5  . COPD (chronic obstructive pulmonary disease) ([place])   . Diabetes mellitus without complication ([place])   . Hypertension   . Prostate cancer [facility])     Family History  Problem Relation Age of Onset  . Heart disease Father        before age 89  . Heart disease Mother   . Cancer Neg Hx     SOCIAL HISTORY: Social History   Tobacco Use  . Smoking status: Current Every Day Smoker    Packs/day: 2.00    Years: 44.00    Pack years: 88.00    Types:  Cigarettes  . Smokeless tobacco: Never Used  Substance Use Topics  . Alcohol use: No    Alcohol/week: 0.0 standard drinks    Allergies  Allergen Reactions  . Sulfa Antibiotics Hives, Itching and Swelling    Tongue swelling  . Sulfasalazine Hives, Itching and Swelling    Tongue swelling    Current Outpatient Medications  Medication Sig Dispense Refill  . Albuterol (VENTOLIN IN) Inhale into the lungs every 8 (eight) hours as needed.     Marland Kitchen alprazolam (XANAX) 2 MG tablet Take 2 mg by mouth 2 (two) times daily.    . cyclobenzaprine (FLEXERIL) 10 MG tablet Take 10 mg by mouth 3 (three) times daily as needed.    . DULoxetine (CYMBALTA) 30 MG capsule Take 30 mg by mouth daily.    Marland Kitchen glipiZIDE (GLUCOTROL) 10 MG tablet Take 10 mg by mouth 2 (two) times daily before a meal.    . Multiple Vitamins-Minerals (CENTRUM SILVER 50+MEN PO) Take by mouth every morning.    . tamsulosin (FLOMAX) 0.4 MG CAPS capsule     . vitamin C (ASCORBIC ACID) 500 MG tablet Take 500 mg by mouth daily.    . vitamin E 1000 UNIT capsule Take 1,000 Units by mouth daily.     No  current facility-administered medications for this visit.     REVIEW OF SYSTEMS:  [X]  denotes positive finding, [ ]  denotes negative finding Cardiac  Comments:  Chest pain or chest pressure:    Shortness of breath upon exertion: x   Short of breath when lying flat:    Irregular heart rhythm:        Vascular    Pain in calf, thigh, or hip brought on by ambulation:    Pain in feet at night that wakes you up from your sleep:     Blood clot in your veins:    Leg swelling:         Pulmonary    Oxygen at home:    Productive cough:     Wheezing:         Neurologic    Sudden weakness in arms or legs:     Sudden numbness in arms or legs:     Sudden onset of difficulty speaking or slurred speech:    Temporary loss of vision in one eye:     Problems with dizziness:         Gastrointestinal    Blood in stool:     Vomited blood:           Genitourinary    Burning when urinating:     Blood in urine:        Psychiatric    Major depression:         Hematologic    Bleeding problems:    Problems with blood clotting too easily:        Skin    Rashes or ulcers:        Constitutional    Fever or chills:     PHYSICAL EXAM:   Vitals:   12/25/18 1108  BP: (!) 148/90  Pulse: 90  Resp: 20  Temp: (!) 97.2 F (36.2 C)  SpO2: 97%  Weight: 169 lb (76.7 kg)  Height: 5\' 10"  (1.778 m)    GENERAL: The patient is a well-nourished male, in no acute distress. The vital signs are documented above. CARDIAC: There is a regular rate and rhythm.  VASCULAR: I do not detect carotid bruits. He has palpable femoral pulses. Both feet are warm and well-perfused. PULMONARY: There is good air exchange bilaterally without wheezing or rales. ABDOMEN: Soft and non-tender with normal pitched bowel sounds.  MUSCULOSKELETAL: There are no major deformities or cyanosis. NEUROLOGIC: No focal weakness or paresthesias are detected. SKIN: There are no ulcers or rashes noted. PSYCHIATRIC: The patient has a normal affect.  DATA:    CT ANGIOS CHEST ABDOMEN PELVIS: I have reviewed the images of his CT Angie of the chest abdomen pelvis.  The ascending aorta measures 3.3 cm in maximum diameter which is stable.  The type IV thoracoabdominal aneurysm measures 5.2 cm in maximum diameter which has not changed significantly compared to 5.1 cm 6 months ago.  MEDICAL ISSUES:   5.2 CM TYPE IV THORACOABDOMINAL ANEURYSM: His type IV thoracoabdominal aneurysm remained stable in size.  I explained that we would normally not consider elective repair unless it reached 5.5 cm in diameter.  I also explained that given that this is a type IV thoracoabdominal aneurysm we would be referred to [facility] to be evaluated for a fenestrated graft or possible thoracoabdominal exposure for open repair.  He is comfortable with this and would prefer to continue to be followed  here unless the aneurysm enlarges significantly.  We have again had  a long discussion about the importance of tobacco cessation.   3.3 CM ASCENDING AORTIC ANEURYSM: This likewise is stable in size.  We would only consider referral to the cardiothoracic surgeons if this enlarged significantly.  He will get a follow-up CT chest in 6 months.  Deitra Mayo [facility] [phone]

## 2018-12-26 ENCOUNTER — Encounter: Payer: Medicare Other | Attending: Family Medicine | Admitting: Nutrition

## 2018-12-26 ENCOUNTER — Ambulatory Visit (INDEPENDENT_AMBULATORY_CARE_PROVIDER_SITE_OTHER): Payer: Medicare Other | Admitting: "Endocrinology

## 2018-12-26 ENCOUNTER — Encounter: Payer: Self-pay | Admitting: Nutrition

## 2018-12-26 ENCOUNTER — Encounter: Payer: Self-pay | Admitting: "Endocrinology

## 2018-12-26 VITALS — BP 130/84 | HR 108 | Ht 70.0 in | Wt 166.0 lb

## 2018-12-26 VITALS — Ht 70.0 in | Wt 166.0 lb

## 2018-12-26 DIAGNOSIS — E1165 Type 2 diabetes mellitus with hyperglycemia: Secondary | ICD-10-CM | POA: Insufficient documentation

## 2018-12-26 DIAGNOSIS — E118 Type 2 diabetes mellitus with unspecified complications: Secondary | ICD-10-CM | POA: Insufficient documentation

## 2018-12-26 DIAGNOSIS — IMO0002 Reserved for concepts with insufficient information to code with codable children: Secondary | ICD-10-CM

## 2018-12-26 DIAGNOSIS — E1159 Type 2 diabetes mellitus with other circulatory complications: Secondary | ICD-10-CM | POA: Diagnosis not present

## 2018-12-26 DIAGNOSIS — F17200 Nicotine dependence, unspecified, uncomplicated: Secondary | ICD-10-CM | POA: Diagnosis not present

## 2018-12-26 NOTE — Patient Instructions (Signed)
Goals 1. Increase exercise  15 minutes a daily 2. Stop eating before going bed.

## 2018-12-26 NOTE — Progress Notes (Signed)
Medical Nutrition Therapy:  Appt start time: 1430end time:  1500  Assessment:  Primary concerns today: Diabetes Type 2. PMH: Hyperlipidemia, HTN.  Here with his wife. Had DM for 5 years. Glipizide 10 mg BID. Sees Dr. Dorris Fetch for his diabetes. Eats 3 meals per day now.. Had a stroke last June 2018. BS only dropped once since last visit. Feels better. HIs wife has been excellent with meals and portion sizes and meal planning.  A1C 7.1% down from 7.3%.  Has changes made:  Eating better balanced meals. Wife is measuring and portion foods out. BS are avg 130- 150's.  Testing blood sugars twice .  Before bed: 90-100's.  Working on eating better, timing of meals and portions. No longer skipping meals. Eats some at night after supper.    Lab Results  Component Value Date   HGBA1C 7.1 (H) 12/20/2018   CMP Latest Ref Rng & Units 12/20/2018 07/09/2018 07/09/2018  Glucose 65 - 99 mg/dL 145(H) - -  BUN 7 - 25 mg/dL 16 17 10   Creatinine 0.70 - 1.25 mg/dL 1.15 1.1 0.9  Sodium 135 - 146 mmol/L 138 - 137  Potassium 3.5 - 5.3 mmol/L 4.8 - 4.8  Chloride 98 - 110 mmol/L 100 - -  CO2 20 - 32 mmol/L 28 - -  Calcium 8.6 - 10.3 mg/dL 9.8 - -  Total Protein 6.1 - 8.1 g/dL 7.5 - -  Total Bilirubin 0.2 - 1.2 mg/dL 0.4 - -  AST 10 - 35 U/L 27 - 23  ALT 9 - 46 U/L 39 - 18   Lipid Panel     Component Value Date/Time   CHOL 275 (A) 07/09/2018   CHOL 225 (A) 07/09/2018   TRIG 244 (A) 07/09/2018   TRIG 187 (A) 07/09/2018   HDL 42 07/09/2018   HDL 36 07/09/2018   LDLCALC 184 07/09/2018   LDLCALC 152 07/09/2018     Preferred Learning Style:   No preference indicated   Learning Readiness:  Not ready  Contemplating  Ready  Change in progress   MEDICATIONS:   DIETARY INTAKE: 24-hr recall:  B ( AM): skips mostly   Snk ( AM):  L ( PM):  Skipped today: 3 eggs, 1 sausage and 1 slice ww toast. water Snk ( PM): D ( PM): Vegetable beef soup and grilled cheese sandwich, water Snk ( PM): Banana  Sandwich, water Beverages: water  Usual physical activity: ADL  Estimated energy needs: 1800-200- calories 200  g carbohydrates 135 g protein 50 g fat  Progress Towards Goal(s):  In progress.   Nutritional Diagnosis:  NB-1.1 Food and nutrition-related knowledge deficit As related to Diabetes and Hyperlipidemia.  As evidenced by A1C 7.3% and elevated lipd levels..    Intervention:  Nutrition and Diabetes education provided on My Plate, CHO counting, meal planning, portion sizes, timing of meals, avoiding snacks between meals unless having a low blood sugar, target ranges for A1C and blood sugars, signs/symptoms and treatment of hyper/hypoglycemia, monitoring blood sugars, taking medications as prescribed, benefits of exercising 30 minutes per day and prevention of complications of DM.  Goals 1. Increase exercise  15 minutes a daily 2. Stop eating before going bed.  Teaching Method Utilized:  Visual Auditory Hands on  Handouts given during visit include:  The Plate Method   Meal Plan Card  Barriers to learning/adherence to lifestyle change: none  Demonstrated degree of understanding via:  Teach Back   Monitoring/Evaluation:  Dietary intake, exercise, meal planning , and body  weight in 1 month(s).

## 2018-12-26 NOTE — Progress Notes (Signed)
Endocrinology follow-up Note       12/26/2018, 2:26 PM   Subjective:    Patient ID: Stephen Palmer, male    DOB: 07-16-1951.  Stephen Palmer is being seen in consultation for management of currently uncontrolled symptomatic type 2 diabetes requested by  [name], PA-C.   Past Medical History:  Diagnosis Date  . Anxiety   . Aortic aneurysm (HCC)    4.4 will have intervention when 5.5  . COPD (chronic obstructive pulmonary disease) ([place])   . Diabetes mellitus without complication ([place])   . Hypertension   . Prostate cancer [facility])    Past Surgical History:  Procedure Laterality Date  . COLONOSCOPY N/A 11/23/2015   Procedure: COLONOSCOPY;  Surgeon: Aviva Signs, MD;  Location: [facility];  Service: Gastroenterology;  Laterality: N/A;  . FRACTURE SURGERY    . PROSTATE BIOPSY    . SINUS SURGERY WITH INSTATRAK     Social History   Socioeconomic History  . Marital status: Married    Spouse name: Not on file  . Number of children: Not on file  . Years of education: Not on file  . Highest education level: Not on file  Occupational History  . Not on file  Social Needs  . Financial resource strain: Not on file  . Food insecurity:    Worry: Not on file    Inability: Not on file  . Transportation needs:    Medical: Not on file    Non-medical: Not on file  Tobacco Use  . Smoking status: Current Every Day Smoker    Packs/day: 2.00    Years: 44.00    Pack years: 88.00    Types: Cigarettes  . Smokeless tobacco: Never Used  Substance and Sexual Activity  . Alcohol use: No    Alcohol/week: 0.0 standard drinks  . Drug use: No  . Sexual activity: Never  Lifestyle  . Physical activity:    Days per week: Not on file    Minutes per session: Not on file  . Stress: Not on file  Relationships  . Social connections:    Talks on phone: Not on file    Gets together: Not on file    Attends  religious service: Not on file    Active member of club or organization: Not on file    Attends meetings of clubs or organizations: Not on file    Relationship status: Not on file  Other Topics Concern  . Not on file  Social History Narrative  . Not on file   Outpatient Encounter Medications as of 12/26/2018  Medication Sig  . Albuterol (VENTOLIN IN) Inhale into the lungs every 8 (eight) hours as needed.   Marland Kitchen alprazolam (XANAX) 2 MG tablet Take 2 mg by mouth 2 (two) times daily.  . cyclobenzaprine (FLEXERIL) 10 MG tablet Take 10 mg by mouth 3 (three) times daily as needed.  . DULoxetine (CYMBALTA) 30 MG capsule Take 30 mg by mouth daily.  Marland Kitchen glipiZIDE (GLUCOTROL) 10 MG tablet Take 10 mg by mouth 2 (two) times daily before a meal.  . Multiple Vitamins-Minerals (CENTRUM SILVER  50+MEN PO) Take by mouth every morning.  . tamsulosin (FLOMAX) 0.4 MG CAPS capsule   . vitamin C (ASCORBIC ACID) 500 MG tablet Take 500 mg by mouth daily.  . vitamin E 1000 UNIT capsule Take 1,000 Units by mouth daily.   No facility-administered encounter medications on file as of 12/26/2018.     ALLERGIES: Allergies  Allergen Reactions  . Sulfa Antibiotics Hives, Itching and Swelling    Tongue swelling  . Sulfasalazine Hives, Itching and Swelling    Tongue swelling    VACCINATION STATUS:  There is no immunization history on file for this patient.  Diabetes  He presents for his follow-up diabetic visit. He has type 2 diabetes mellitus. Onset time: He was diagnosed at approximate age of 29 years. His disease course has been stable. There are no hypoglycemic associated symptoms. Pertinent negatives for hypoglycemia include no confusion, headaches, pallor or seizures. Associated symptoms include blurred vision. Pertinent negatives for diabetes include no chest pain, no fatigue, no polydipsia, no polyphagia, no polyuria and no weakness. There are no hypoglycemic complications. Symptoms are stable. Diabetic  complications include a CVA. Risk factors for coronary artery disease include diabetes mellitus, hypertension, male sex and tobacco exposure. Current diabetic treatment includes oral agent (monotherapy) (Is currently taking glipizide 10 mg p.o. twice daily.). His weight is stable. He is following a generally unhealthy diet. When asked about meal planning, he reported none. He has not had a previous visit with a dietitian. He never participates in exercise. His breakfast blood glucose range is generally 140-180 mg/dl. His overall blood glucose range is 140-180 mg/dl. (He brought in his meter showing average blood glucose of 150 over the last 30 days.  He is A1c 7.1% improving from 8.4%.     ) An ACE inhibitor/angiotensin II receptor blocker is not being taken. Eye exam is current.     Review of Systems  Constitutional: Negative for chills, fatigue, fever and unexpected weight change.  HENT: Negative for dental problem, mouth sores and trouble swallowing.   Eyes: Positive for blurred vision. Negative for visual disturbance.  Respiratory: Positive for cough and shortness of breath. Negative for choking, chest tightness and wheezing.   Cardiovascular: Negative for chest pain, palpitations and leg swelling.  Gastrointestinal: Negative for abdominal distention, abdominal pain, constipation, diarrhea, nausea and vomiting.  Endocrine: Negative for polydipsia, polyphagia and polyuria.  Genitourinary: Negative for dysuria, flank pain, hematuria and urgency.  Musculoskeletal: Negative for back pain, gait problem, myalgias and neck pain.  Skin: Negative for pallor, rash and wound.  Neurological: Negative for seizures, syncope, weakness, numbness and headaches.  Psychiatric/Behavioral: Negative for confusion and dysphoric mood.    Objective:    BP 130/84   Pulse (!) 108   Ht 5\' 10"  (1.778 m)   Wt 166 lb (75.3 kg)   BMI 23.82 kg/m   Wt Readings from Last 3 Encounters:  12/26/18 166 lb (75.3 kg)   12/25/18 169 lb (76.7 kg)  12/17/18 169 lb 6 oz (76.8 kg)     Physical Exam Constitutional:      General: He is not in acute distress.    Appearance: He is well-developed.  HENT:     Head: Normocephalic and atraumatic.  Neck:     Musculoskeletal: Normal range of motion and neck supple.     Thyroid: No thyromegaly.     Trachea: No tracheal deviation.  Cardiovascular:     Pulses:          Dorsalis pedis pulses  are 1+ on the right side and 1+ on the left side.       Posterior tibial pulses are 1+ on the right side and 1+ on the left side.     Heart sounds: S1 normal and S2 normal. No murmur. No gallop.   Pulmonary:     Effort: Pulmonary effort is normal. No respiratory distress.     Breath sounds: No wheezing.  Abdominal:     General: There is no distension.     Tenderness: There is no abdominal tenderness. There is no guarding.  Musculoskeletal:     Right shoulder: He exhibits no swelling and no deformity.  Skin:    General: Skin is warm and dry.     Findings: No rash.     Nails: There is no clubbing.   Neurological:     Mental Status: He is alert and oriented to person, place, and time.     Cranial Nerves: No cranial nerve deficit.     Sensory: No sensory deficit.     Gait: Gait normal.     Deep Tendon Reflexes: Reflexes are normal and symmetric.  Psychiatric:        Speech: Speech normal.        Behavior: Behavior is cooperative.        Judgment: Judgment normal.     Comments: Patient has reluctant affect.     Recent Results (from the past 2160 hour(s))  Hemoglobin A1c     Status: Abnormal   Collection Time: 12/20/18 12:22 PM  Result Value Ref Range   Hgb A1c MFr Bld 7.1 (H) <5.7 % of total Hgb    Comment: For someone without known diabetes, a hemoglobin A1c value of 6.5% or greater indicates that they may have  diabetes and this should be confirmed with a follow-up  test. . For someone with known diabetes, a value <7% indicates  that their diabetes is well  controlled and a value  greater than or equal to 7% indicates suboptimal  control. A1c targets should be individualized based on  duration of diabetes, age, comorbid conditions, and  other considerations. . Currently, no consensus exists regarding use of hemoglobin A1c for diagnosis of diabetes for children. .    Mean Plasma Glucose 157 (calc)   eAG (mmol/L) 8.7 (calc)  COMPLETE METABOLIC PANEL WITH GFR     Status: Abnormal   Collection Time: 12/20/18 12:22 PM  Result Value Ref Range   Glucose, Bld 145 (H) 65 - 99 mg/dL    Comment: .            Fasting reference interval . For someone without known diabetes, a glucose value >125 mg/dL indicates that they may have diabetes and this should be confirmed with a follow-up test. .    BUN 16 7 - 25 mg/dL   Creat 1.15 0.70 - 1.25 mg/dL    Comment: For patients >24 years of age, the reference limit for Creatinine is approximately 13% higher for people identified as African-American. .    GFR, Est Non African American 65 > OR = 60 mL/min/1.47m2   GFR, Est African American 76 > OR = 60 mL/min/1.31m2   BUN/Creatinine Ratio NOT APPLICABLE 6 - 22 (calc)   Sodium 138 135 - 146 mmol/L   Potassium 4.8 3.5 - 5.3 mmol/L   Chloride 100 98 - 110 mmol/L   CO2 28 20 - 32 mmol/L   Calcium 9.8 8.6 - 10.3 mg/dL   Total Protein 7.5 6.1 -  8.1 g/dL   Albumin 4.6 3.6 - 5.1 g/dL   Globulin 2.9 1.9 - 3.7 g/dL (calc)   AG Ratio 1.6 1.0 - 2.5 (calc)   Total Bilirubin 0.4 0.2 - 1.2 mg/dL   Alkaline phosphatase (APISO) 101 40 - 115 U/L   AST 27 10 - 35 U/L   ALT 39 9 - 46 U/L      Assessment & Plan:   1. DM type 2 causing vascular disease ([place])  - Stephen Palmer has currently uncontrolled symptomatic type 2 DM since 68 years of age. -He presents with near target glycemic profile averaging between 100-150, and recent A1c of 7.1%, progressively improving from 11.5%.    -Recent labs are reviewed with him.   -his diabetes is complicated by CVA ,  chronic heavy smoking, and he remains at extremely high risk for more acute and chronic complications which include CAD, CVA, CKD, retinopathy, and neuropathy. These are all discussed in detail with him.  - I have counseled him on diet management by adopting a carbohydrate restricted/protein rich diet.  -  Suggestion is made for him to avoid simple carbohydrates  from his diet including Cakes, Sweet Desserts / Pastries, Ice Cream, Soda (diet and regular), Sweet Tea, Candies, Chips, Cookies, Store Bought Juices, Alcohol in Excess of  1-2 drinks a day, Artificial Sweeteners, and "Sugar-free" Products. This will help patient to have stable blood glucose profile and potentially avoid unintended weight gain.   - I encouraged him to switch to  unprocessed or minimally processed complex starch and increased protein intake (animal or plant source), fruits, and vegetables.  - he is advised to stick to a routine mealtimes to eat 3 meals  a day and avoid unnecessary snacks ( to snack only to correct hypoglycemia).    - I have approached him with the following individualized plan to manage diabetes and patient agrees:   -Based on his presentation with near target glycemic profile and his clear reluctance to go on insulin, he will be kept on non-insulin treatment for now.   -He is advised to continue glipizide 10 mg p.o. twice daily with breakfast and supper, continue monitoring blood glucose at least 2 times daily-before breakfast and before supper and at any other time as needed. - he is encouraged to call clinic for blood glucose levels less than 70 or above 300 mg /dl.  -He is not a suitable candidate for metformin, SGLT2 inhibitors, nor incretin therapy. -He will be reapproached for insulin treatment if he loses control by next visit.  - Patient specific target  A1c;  LDL, HDL, Triglycerides, and  Waist Circumference were discussed in detail.  2) BP/HTN:  his blood pressure is controlled to target.   He is not on any antihypertensive medications, reported sulfa allergy.   3) Lipids/HPL: No recent lipid panel to review.  He is not on any statins.    4)  Weight/Diet:  Body mass index is 23.82 kg/m.   Weight loss is not advisable for him.  CDE Consult will be initiated . Exercise, and detailed carbohydrates information provided  -  detailed on discharge instructions.  5) Chronic Care/Health Maintenance:  -he  Is not on ACEI/ARB and Statin medications and  is encouraged to initiate and continue to follow up with Ophthalmology, Dentist,  Podiatrist at least yearly or according to recommendations, and advised to  Quit smoking (this is his #1health risk). I have recommended yearly flu vaccine and pneumonia vaccine at least every 5  years; moderate intensity exercise for up to 150 minutes weekly; and  sleep for at least 7 hours a day.  - I advised patient to maintain close follow up with [name], PA-C for primary care needs.  - Time spent with the patient: 25 min, of which >50% was spent in reviewing his blood glucose logs , discussing his hypo- and hyper-glycemic episodes, reviewing his current and  previous labs and insulin doses and developing a plan to avoid hypo- and hyper-glycemia. Please refer to Patient Instructions for Blood Glucose Monitoring and Insulin/Medications Dosing Guide"  in media tab for additional information. Stephen Palmer participated in the discussions, expressed understanding, and voiced agreement with the above plans.  All questions were answered to his satisfaction. he is encouraged to contact clinic should he have any questions or concerns prior to his return visit.   Follow up plan: - Return in about 6 months (around 06/26/2019) for Meter, and Logs.  Glade Lloyd, MD [facility] [address] [place], [place] [address] Phone: [phone]  Fax: [phone]    12/26/2018, 2:26 PM  This note was  partially dictated with voice recognition software. Similar sounding words can be transcribed inadequately or may not  be corrected upon review.

## 2019-01-22 DIAGNOSIS — Z85828 Personal history of other malignant neoplasm of skin: Secondary | ICD-10-CM | POA: Diagnosis not present

## 2019-01-22 DIAGNOSIS — C44319 Basal cell carcinoma of skin of other parts of face: Secondary | ICD-10-CM | POA: Diagnosis not present

## 2019-01-27 ENCOUNTER — Ambulatory Visit (INDEPENDENT_AMBULATORY_CARE_PROVIDER_SITE_OTHER): Payer: Medicare Other | Admitting: Cardiovascular Disease

## 2019-01-27 ENCOUNTER — Encounter: Payer: Self-pay | Admitting: Cardiovascular Disease

## 2019-01-27 VITALS — BP 146/89 | HR 102 | Ht 70.0 in | Wt 167.0 lb

## 2019-01-27 DIAGNOSIS — R079 Chest pain, unspecified: Secondary | ICD-10-CM | POA: Diagnosis not present

## 2019-01-27 DIAGNOSIS — I714 Abdominal aortic aneurysm, without rupture, unspecified: Secondary | ICD-10-CM

## 2019-01-27 DIAGNOSIS — F17200 Nicotine dependence, unspecified, uncomplicated: Secondary | ICD-10-CM | POA: Diagnosis not present

## 2019-01-27 DIAGNOSIS — I2510 Atherosclerotic heart disease of native coronary artery without angina pectoris: Secondary | ICD-10-CM

## 2019-01-27 NOTE — Patient Instructions (Signed)
Medication Instructions:  Continue all current medications.  Labwork: none  Testing/Procedures: none  Follow-Up: As needed.    Any Other Special Instructions Will Be Listed Below (If Applicable).  If you need a refill on your cardiac medications before your next appointment, please call your pharmacy.  

## 2019-01-27 NOTE — Progress Notes (Signed)
SUBJECTIVE: The patient returns for follow-up after undergoing cardiovascular testing performed for the evaluation of chest pain.  He underwent a normal nuclear stress test on 08/22/2018, EF 70%.  I reviewed CT angiography of the chest, abdomen and pelvis performed on 12/02/2018.  This demonstrated stable thoracic aortic ectasia and atherosclerosis, maximal thoracic aortic diameter 3.3 cm proximally.  There was no pulmonary embolism.  Stable spiculated 10 x 11 mm anterior left upper lobe nodule.  Abdominal aortic aneurysm was 5.2 cm and unchanged.  He is here with his wife.  He denies chest pain and palpitations as well as leg swelling.  He is now on a strict diet that both he and his wife adhere to.    Review of Systems: As per "subjective", otherwise negative.  Allergies  Allergen Reactions  . Sulfa Antibiotics Hives, Itching and Swelling    Tongue swelling  . Sulfasalazine Hives, Itching and Swelling    Tongue swelling    Current Outpatient Medications  Medication Sig Dispense Refill  . Albuterol (VENTOLIN IN) Inhale into the lungs every 8 (eight) hours as needed.     Marland Kitchen alprazolam (XANAX) 2 MG tablet Take 2 mg by mouth 2 (two) times daily.    . cyclobenzaprine (FLEXERIL) 10 MG tablet Take 10 mg by mouth 3 (three) times daily as needed.    . DULoxetine (CYMBALTA) 30 MG capsule Take 30 mg by mouth daily.    Marland Kitchen glipiZIDE (GLUCOTROL) 10 MG tablet Take 10 mg by mouth 2 (two) times daily before a meal.    . Multiple Vitamins-Minerals (CENTRUM SILVER 50+MEN PO) Take by mouth every morning.    . tamsulosin (FLOMAX) 0.4 MG CAPS capsule     . vitamin C (ASCORBIC ACID) 500 MG tablet Take 500 mg by mouth daily.    . vitamin E 1000 UNIT capsule Take 1,000 Units by mouth daily.     No current facility-administered medications for this visit.     Past Medical History:  Diagnosis Date  . Anxiety   . Aortic aneurysm (HCC)    4.4 will have intervention when 5.5  . COPD (chronic  obstructive pulmonary disease) ([place])   . Diabetes mellitus without complication ([place])   . Hypertension   . Prostate cancer [facility])     Past Surgical History:  Procedure Laterality Date  . COLONOSCOPY N/A 11/23/2015   Procedure: COLONOSCOPY;  Surgeon: Aviva Signs, MD;  Location: [facility];  Service: Gastroenterology;  Laterality: N/A;  . FRACTURE SURGERY    . PROSTATE BIOPSY    . SINUS SURGERY WITH INSTATRAK      Social History   Socioeconomic History  . Marital status: Married    Spouse name: Not on file  . Number of children: Not on file  . Years of education: Not on file  . Highest education level: Not on file  Occupational History  . Not on file  Social Needs  . Financial resource strain: Not on file  . Food insecurity:    Worry: Not on file    Inability: Not on file  . Transportation needs:    Medical: Not on file    Non-medical: Not on file  Tobacco Use  . Smoking status: Current Every Day Smoker    Packs/day: 2.00    Years: 44.00    Pack years: 88.00    Types: Cigarettes  . Smokeless tobacco: Never Used  Substance and Sexual Activity  . Alcohol use: No    Alcohol/week:  0.0 standard drinks  . Drug use: No  . Sexual activity: Never  Lifestyle  . Physical activity:    Days per week: Not on file    Minutes per session: Not on file  . Stress: Not on file  Relationships  . Social connections:    Talks on phone: Not on file    Gets together: Not on file    Attends religious service: Not on file    Active member of club or organization: Not on file    Attends meetings of clubs or organizations: Not on file    Relationship status: Not on file  . Intimate partner violence:    Fear of current or ex partner: Not on file    Emotionally abused: Not on file    Physically abused: Not on file    Forced sexual activity: Not on file  Other Topics Concern  . Not on file  Social History Narrative  . Not on file     Vitals:   01/27/19 1510  BP: (!) 146/89    Pulse: (!) 102  SpO2: 96%  Weight: 167 lb (75.8 kg)  Height: 5\' 10"  (1.778 m)    Wt Readings from Last 3 Encounters:  01/27/19 167 lb (75.8 kg)  12/26/18 166 lb (75.3 kg)  12/26/18 166 lb (75.3 kg)     PHYSICAL EXAM General: NAD HEENT: Normal. Neck: No JVD, no thyromegaly. Lungs: Diminished sounds throughout, no crackles or wheezes. CV: Regular rate and rhythm, normal S1/S2, no S3/S4, no murmur. No pretibial or periankle edema.    Abdomen: Soft, nontender, no distention.  Neurologic: Alert and oriented.  Psych: Normal affect. Skin: Normal. Musculoskeletal: No gross deformities.    ECG: Reviewed above under Subjective   Labs: Lab Results  Component Value Date/Time   K 4.8 12/20/2018 12:22 PM   BUN 16 12/20/2018 12:22 PM   BUN 10 07/09/2018   BUN 17 07/09/2018   CREATININE 1.15 12/20/2018 12:22 PM   ALT 39 12/20/2018 12:22 PM   TSH 2.02 07/09/2018   TSH 1.82 07/09/2018   HGB 12.6 (L) 05/13/2012 08:06 PM     Lipids: Lab Results  Component Value Date/Time   LDLCALC 184 07/09/2018   LDLCALC 152 07/09/2018   CHOL 275 (A) 07/09/2018   CHOL 225 (A) 07/09/2018   TRIG 244 (A) 07/09/2018   TRIG 187 (A) 07/09/2018   HDL 42 07/09/2018   HDL 36 07/09/2018       ASSESSMENT AND PLAN:  1.  Chest pain and coronary artery calcifications: No symptom recurrence.  He underwent a normal nuclear stress test on 08/22/2018, EF 70%.  No further cardiac testing is indicated at this time.  2.  Abdominal aortic aneurysm: 5.2 cm by CT on 12/02/2018.  Followed by vascular surgery.   Disposition: Follow up as needed   Kate Sable, M.D., F.A.C.C.

## 2019-02-20 DIAGNOSIS — Z681 Body mass index (BMI) 19 or less, adult: Secondary | ICD-10-CM | POA: Diagnosis not present

## 2019-02-20 DIAGNOSIS — Z1389 Encounter for screening for other disorder: Secondary | ICD-10-CM | POA: Diagnosis not present

## 2019-02-20 DIAGNOSIS — F419 Anxiety disorder, unspecified: Secondary | ICD-10-CM | POA: Diagnosis not present

## 2019-04-17 DIAGNOSIS — J441 Chronic obstructive pulmonary disease with (acute) exacerbation: Secondary | ICD-10-CM | POA: Diagnosis not present

## 2019-04-17 DIAGNOSIS — J069 Acute upper respiratory infection, unspecified: Secondary | ICD-10-CM | POA: Diagnosis not present

## 2019-04-17 DIAGNOSIS — Z681 Body mass index (BMI) 19 or less, adult: Secondary | ICD-10-CM | POA: Diagnosis not present

## 2019-04-25 ENCOUNTER — Telehealth: Payer: Self-pay | Admitting: *Deleted

## 2019-04-25 NOTE — Telephone Encounter (Signed)
CALLED PATIENT TO INFORM OF LAB ON 06-11-19- ARRIVAL TIME - 9:45 AM @ [facility], AND HIS CT ON 06-11-19- ARRIVAL TIME- 10 AM @ [facility], PT. TO BE NPO- 4 HRS. PRIOR TO TEST, AND PT. TO FOLLOW -UP WITH ASHLYN BRUNING ON 06-12-19 @ 1 PM FOR RESULTS, SPOKE WITH PATIENT AND HE IS AWARE OF THESE APPTS.

## 2019-05-14 DIAGNOSIS — C61 Malignant neoplasm of prostate: Secondary | ICD-10-CM | POA: Diagnosis not present

## 2019-05-15 ENCOUNTER — Other Ambulatory Visit: Payer: Self-pay

## 2019-05-15 ENCOUNTER — Emergency Department (HOSPITAL_COMMUNITY): Payer: Medicare Other

## 2019-05-15 ENCOUNTER — Emergency Department (HOSPITAL_COMMUNITY)
Admission: EM | Admit: 2019-05-15 | Discharge: 2019-05-15 | Disposition: A | Discharge status: Home / Self Care | Payer: Medicare Other | Attending: Emergency Medicine | Admitting: Emergency Medicine

## 2019-05-15 ENCOUNTER — Encounter (HOSPITAL_COMMUNITY): Payer: Self-pay | Admitting: Emergency Medicine

## 2019-05-15 DIAGNOSIS — Z20828 Contact with and (suspected) exposure to other viral communicable diseases: Secondary | ICD-10-CM | POA: Insufficient documentation

## 2019-05-15 DIAGNOSIS — J449 Chronic obstructive pulmonary disease, unspecified: Secondary | ICD-10-CM | POA: Diagnosis not present

## 2019-05-15 DIAGNOSIS — R911 Solitary pulmonary nodule: Secondary | ICD-10-CM | POA: Diagnosis not present

## 2019-05-15 DIAGNOSIS — Z6824 Body mass index (BMI) 24.0-24.9, adult: Secondary | ICD-10-CM | POA: Diagnosis not present

## 2019-05-15 DIAGNOSIS — R918 Other nonspecific abnormal finding of lung field: Secondary | ICD-10-CM | POA: Insufficient documentation

## 2019-05-15 DIAGNOSIS — R0600 Dyspnea, unspecified: Secondary | ICD-10-CM | POA: Insufficient documentation

## 2019-05-15 DIAGNOSIS — Z7984 Long term (current) use of oral hypoglycemic drugs: Secondary | ICD-10-CM | POA: Diagnosis not present

## 2019-05-15 DIAGNOSIS — Z79899 Other long term (current) drug therapy: Secondary | ICD-10-CM | POA: Insufficient documentation

## 2019-05-15 DIAGNOSIS — E1151 Type 2 diabetes mellitus with diabetic peripheral angiopathy without gangrene: Secondary | ICD-10-CM | POA: Diagnosis not present

## 2019-05-15 DIAGNOSIS — R05 Cough: Secondary | ICD-10-CM | POA: Diagnosis not present

## 2019-05-15 DIAGNOSIS — Z8546 Personal history of malignant neoplasm of prostate: Secondary | ICD-10-CM | POA: Insufficient documentation

## 2019-05-15 DIAGNOSIS — R059 Cough, unspecified: Secondary | ICD-10-CM

## 2019-05-15 DIAGNOSIS — J439 Emphysema, unspecified: Secondary | ICD-10-CM | POA: Diagnosis not present

## 2019-05-15 DIAGNOSIS — R0602 Shortness of breath: Secondary | ICD-10-CM | POA: Diagnosis present

## 2019-05-15 DIAGNOSIS — R000 Tachycardia, unspecified: Secondary | ICD-10-CM | POA: Diagnosis not present

## 2019-05-15 DIAGNOSIS — F17210 Nicotine dependence, cigarettes, uncomplicated: Secondary | ICD-10-CM | POA: Insufficient documentation

## 2019-05-15 DIAGNOSIS — I700 Atherosclerosis of aorta: Secondary | ICD-10-CM | POA: Diagnosis not present

## 2019-05-15 DIAGNOSIS — I10 Essential (primary) hypertension: Secondary | ICD-10-CM | POA: Insufficient documentation

## 2019-05-15 DIAGNOSIS — C3492 Malignant neoplasm of unspecified part of left bronchus or lung: Secondary | ICD-10-CM

## 2019-05-15 HISTORY — DX: Cerebral infarction, unspecified: I63.9

## 2019-05-15 LAB — CBC
HCT: 46.4 % (ref 39.0–52.0)
Hemoglobin: 14.8 g/dL (ref 13.0–17.0)
MCH: 30.8 pg (ref 26.0–34.0)
MCHC: 31.9 g/dL (ref 30.0–36.0)
MCV: 96.5 fL (ref 80.0–100.0)
Platelets: 263 10*3/uL (ref 150–400)
RBC: 4.81 MIL/uL (ref 4.22–5.81)
RDW: 13.3 % (ref 11.5–15.5)
WBC: 10.5 10*3/uL (ref 4.0–10.5)
nRBC: 0 % (ref 0.0–0.2)

## 2019-05-15 LAB — BASIC METABOLIC PANEL
Anion gap: 11 (ref 5–15)
BUN: 18 mg/dL (ref 8–23)
CO2: 25 mmol/L (ref 22–32)
Calcium: 8.9 mg/dL (ref 8.9–10.3)
Chloride: 101 mmol/L (ref 98–111)
Creatinine, Ser: 1.17 mg/dL (ref 0.61–1.24)
GFR calc Af Amer: >60 mL/min (ref >60)
GFR calc non Af Amer: >60 mL/min (ref >60)
Glucose, Bld: 133 mg/dL — ABNORMAL HIGH (ref 70–99)
Potassium: 4.2 mmol/L (ref 3.5–5.1)
Sodium: 137 mmol/L (ref 135–145)

## 2019-05-15 LAB — SARS CORONAVIRUS 2 BY RT PCR (HOSPITAL ORDER, PERFORMED IN ~~LOC~~ HOSPITAL LAB): SARS Coronavirus 2: NEGATIVE

## 2019-05-15 LAB — TROPONIN I: Troponin I: <0.03 ng/mL (ref <0.03)

## 2019-05-15 LAB — D-DIMER, QUANTITATIVE (NOT AT ARMC): D-Dimer, Quant: 1.31 ug/mL-FEU — ABNORMAL HIGH (ref 0.00–0.50)

## 2019-05-15 MED ORDER — PREDNISONE 10 MG PO TABS: ORAL_TABLET | ORAL | 0 refills | Status: DC

## 2019-05-15 MED ORDER — IOHEXOL 350 MG/ML SOLN
100.0000 mL | Freq: Once | INTRAVENOUS | Status: AC | PRN
  Administered 2019-05-15: 22:00:00 100 mL via INTRAVENOUS

## 2019-05-15 MED ORDER — DOXYCYCLINE HYCLATE 100 MG PO CAPS: 100.0000 mg | ORAL_CAPSULE | Freq: Two times a day (BID) | ORAL | 0 refills | Status: DC

## 2019-05-15 MED ORDER — METHYLPREDNISOLONE SODIUM SUCC 125 MG IJ SOLR
125.0000 mg | Freq: Once | INTRAMUSCULAR | Status: AC
  Administered 2019-05-15: 125 mg via INTRAVENOUS
  Filled 2019-05-15: qty 2

## 2019-05-15 NOTE — ED Triage Notes (Signed)
Patient has had a cough for the past three months and saw his PCP ten days ago and was given inhalers, and abx. Patient states his breathing has not improved. Patient does have a history of COPD and is a current smoker.

## 2019-05-15 NOTE — ED Notes (Signed)
Patient transported to CT 

## 2019-05-15 NOTE — Discharge Instructions (Addendum)
CT scan showed no worrisome new findings.  Prescription for prednisone and antibiotic.  Follow-up with your primary care doctor.

## 2019-05-15 NOTE — ED Provider Notes (Addendum)
[facility] EMERGENCY DEPARTMENT Provider Note   CSN: [id number] Arrival date & time: 05/15/19  1550    History   Chief Complaint Chief Complaint  Patient presents with   Shortness of Breath    HPI Stephen Palmer is a 68 y.o. male.     Cough for 3 months, getting worse.  Known history of COPD, left lung mass, AAA, current heavy smoker, diabetes, hypertension, prostate cancer, CVA.  Chest pain worse with cough.  No fever, sweats, chills, bloody sputum.  He was seen by his PCP approximately 10 days ago and given inhalers and antibiotic.  Severity is moderate.  Nothing makes symptoms better or worse.     Past Medical History:  Diagnosis Date   Anxiety    Aortic aneurysm (HCC)    4.4 will have intervention when 5.5   COPD (chronic obstructive pulmonary disease) (HCC)    Diabetes mellitus without complication ([place])    Hypertension    Prostate cancer ([place])    Stroke [facility])     Patient Active Problem List   Diagnosis Date Noted   DM type 2 causing vascular disease ([place]) 09/05/2018   Current smoker 09/05/2018   Non-small cell carcinoma of left lung, stage 1 ([place]) 09/21/2017   Lung nodule 08/28/2017   Malignant neoplasm of upper lobe of left lung ([place]) 08/02/2017   Prostate cancer ([place]) 09/29/2016    Past Surgical History:  Procedure Laterality Date   COLONOSCOPY N/A 11/23/2015   Procedure: COLONOSCOPY;  Surgeon: Aviva Signs, MD;  Location: [facility] SUITE;  Service: Gastroenterology;  Laterality: N/A;   FRACTURE SURGERY     PROSTATE BIOPSY     SINUS SURGERY WITH INSTATRAK          Home Medications    Prior to Admission medications   Medication Sig Start Date End Date Taking? Authorizing Provider  Albuterol (VENTOLIN IN) Inhale into the lungs every 8 (eight) hours as needed.     [provider]  alprazolam Duanne Moron) 2 MG tablet Take 2 mg by mouth 2 (two) times daily.    [provider]  cyclobenzaprine (FLEXERIL) 10 MG tablet  Take 10 mg by mouth 3 (three) times daily as needed.    [provider]  doxycycline (VIBRAMYCIN) 100 MG capsule Take 1 capsule (100 mg total) by mouth 2 (two) times daily. 05/15/19   Nat Christen, MD  DULoxetine (CYMBALTA) 30 MG capsule Take 30 mg by mouth daily.    [provider]  glipiZIDE (GLUCOTROL) 10 MG tablet Take 10 mg by mouth 2 (two) times daily before a meal.    [provider]  Multiple Vitamins-Minerals (CENTRUM SILVER 50+MEN PO) Take by mouth every morning.    [provider]  predniSONE (DELTASONE) 10 MG tablet 3 tabs for 3 days, 2 tabs for 3 days, 1 tab for 3 days. 05/15/19   Nat Christen, MD  tamsulosin [facility]) 0.4 MG CAPS capsule  09/10/16   [provider]  vitamin C (ASCORBIC ACID) 500 MG tablet Take 500 mg by mouth daily.    [provider]  vitamin E 1000 UNIT capsule Take 1,000 Units by mouth daily.    [provider]    Family History Family History  Problem Relation Age of Onset   Heart disease Father        before age 56   Heart disease Mother    Cancer Neg Hx     Social History Social History   Tobacco Use  Smoking status: Current Every Day Smoker    Packs/day: 2.00    Years: 44.00    Pack years: 88.00    Types: Cigarettes   Smokeless tobacco: Never Used  Substance Use Topics   Alcohol use: No    Alcohol/week: 0.0 standard drinks   Drug use: No     Allergies   Sulfa antibiotics and Sulfasalazine   Review of Systems Review of Systems  All other systems reviewed and are negative.    Physical Exam Updated Vital Signs BP (!) 152/104    Pulse (!) 103    Temp 98.2 F (36.8 C) (Oral)    Resp 20    Ht 5\' 10"  (1.778 m)    Wt 76.2 kg    SpO2 93%    BMI 24.11 kg/m   Physical Exam Vitals signs and nursing note reviewed.  Constitutional:      Comments: nad  HENT:     Head: Normocephalic and atraumatic.  Eyes:     Conjunctiva/sclera: Conjunctivae normal.  Neck:      Musculoskeletal: Neck supple.  Cardiovascular:     Rate and Rhythm: Normal rate and regular rhythm.  Pulmonary:     Effort: Pulmonary effort is normal.     Comments: No obvious dyspnea or tachypnea Abdominal:     General: Bowel sounds are normal.     Palpations: Abdomen is soft.  Musculoskeletal: Normal range of motion.  Skin:    General: Skin is warm and dry.  Neurological:     Mental Status: He is alert and oriented to person, place, and time.  Psychiatric:        Behavior: Behavior normal.      ED Treatments / Results  Labs (all labs ordered are listed, but only abnormal results are displayed) Labs Reviewed  BASIC METABOLIC PANEL - Abnormal; Notable for the following components:      Result Value   Glucose, Bld 133 (*)    All other components within normal limits  D-DIMER, QUANTITATIVE (NOT AT [facility]) - Abnormal; Notable for the following components:   D-Dimer, Quant 1.31 (*)    All other components within normal limits  SARS CORONAVIRUS 2 (HOSPITAL ORDER, [place] [facility])  CBC  TROPONIN I    EKG EKG Interpretation  Date/Time:  Thursday May 15 2019 18:02:12 EDT Ventricular Rate:  95 PR Interval:    QRS Duration: 91 QT Interval:  364 QTC Calculation: 458 R Axis:   86 Text Interpretation:  Sinus tachycardia Atrial premature complexes in couplets Borderline right axis deviation Confirmed by Nat Christen [id number]) on 05/15/2019 6:17:30 PM   Radiology Ct Angio Chest Pe W And/or Wo Contrast  Result Date: 05/15/2019 CLINICAL DATA:  Cough for 3 months lung mass EXAM: CT ANGIOGRAPHY CHEST WITH CONTRAST TECHNIQUE: Multidetector CT imaging of the chest was performed using the standard protocol during bolus administration of intravenous contrast. Multiplanar CT image reconstructions and MIPs were obtained to evaluate the vascular anatomy. CONTRAST:  151mL OMNIPAQUE IOHEXOL 350 MG/ML SOLN COMPARISON:  Radiograph 05/15/2019, CT chest 12/02/2018, 08/06/2018,  06/12/2018 FINDINGS: Cardiovascular: Satisfactory opacification of the pulmonary arteries to the segmental level. No evidence of pulmonary embolism. Ectatic aorta with moderate aortic atherosclerosis. Irregular mural thrombus at the distal arch and descending thoracic aorta. Coronary vascular calcification. Normal heart size. No pericardial effusion Mediastinum/Nodes: Midline trachea. No thyroid mass. No significant adenopathy. Mild air distention of the esophagus. Lungs/Pleura: Stable prominent emphysema with bullous disease at the apices. 10 x 10  mm spiculated left upper lobe lung nodule with surrounding bandlike density. No acute consolidation, pleural effusion or pneumothorax Upper Abdomen: No acute abnormality. Musculoskeletal: No chest wall abnormality. No acute or significant osseous findings. Review of the MIP images confirms the above findings. IMPRESSION: 1. Negative for acute pulmonary embolus. 2. Stable spiculated nodule in the left upper lobe measuring 10 mm in diameter. Background emphysema without acute pulmonary airspace disease. Aortic Atherosclerosis (ICD10-I70.0) and Emphysema (ICD10-J43.9). Electronically Signed   By: Donavan Foil M.D.   On: 05/15/2019 22:45   Dg Chest Port 1 View  Result Date: 05/15/2019 CLINICAL DATA:  Three-month history of cough and shortness of breath. History of lung cancer. EXAM: PORTABLE CHEST 1 VIEW COMPARISON:  Chest CT 12/02/2018 and chest x-ray 08/06/2018 FINDINGS: The cardiac silhouette, mediastinal and hilar contours are within normal limits and stable. Stable emphysematous changes and pulmonary scarring. Stable left upper lobe density likely combination of treated tumor in radiation change. No infiltrates or effusions. The bony thorax is intact. IMPRESSION: 1. No acute cardiopulmonary findings. 2. Emphysematous changes and pulmonary scarring. 3. Left upper lobe scarring changes and vague nodularity, stable. Electronically Signed   By: Marijo Sanes M.D.   On:  05/15/2019 19:08    Procedures Procedures (including critical care time)  Medications Ordered in ED Medications  methylPREDNISolone sodium succinate (SOLU-MEDROL) 125 mg/2 mL injection 125 mg (125 mg Intravenous Given 05/15/19 1847)  iohexol (OMNIPAQUE) 350 MG/ML injection 100 mL (100 mLs Intravenous Contrast Given 05/15/19 2205)     Initial Impression / Assessment and Plan / ED Course  I have reviewed the triage vital signs and the nursing notes.  Pertinent labs & imaging results that were available during my care of the patient were reviewed by me and considered in my medical decision making (see chart for details).        Patient with multiple health problems including smoking, COPD, left lung cancer presents with persistent cough.  He is nontoxic-appearing.  Will initiate appropriate work-up.  Patient rechecked prior to discharge.  He is hemodynamically stable.  No obvious dyspnea.  Discussed CT scan.  Discharge medication doxycycline 100 mg and prednisone. Final Clinical Impressions(s) / ED Diagnoses   Final diagnoses:  Cough  Dyspnea, unspecified type  Non-small cell carcinoma of left lung, stage 1 [facility])    ED Discharge Orders         Ordered    doxycycline (VIBRAMYCIN) 100 MG capsule  2 times daily     05/15/19 2254    predniSONE (DELTASONE) 10 MG tablet     05/15/19 2254           Nat Christen, MD 05/15/19 1900    Nat Christen, MD 05/15/19 2255

## 2019-05-21 ENCOUNTER — Ambulatory Visit (INDEPENDENT_AMBULATORY_CARE_PROVIDER_SITE_OTHER): Payer: Medicare Other | Admitting: Urology

## 2019-05-21 DIAGNOSIS — R351 Nocturia: Secondary | ICD-10-CM

## 2019-05-21 DIAGNOSIS — C61 Malignant neoplasm of prostate: Secondary | ICD-10-CM | POA: Diagnosis not present

## 2019-06-02 ENCOUNTER — Other Ambulatory Visit: Payer: Self-pay | Admitting: Vascular Surgery

## 2019-06-02 DIAGNOSIS — I712 Thoracic aortic aneurysm, without rupture, unspecified: Secondary | ICD-10-CM

## 2019-06-11 DIAGNOSIS — I700 Atherosclerosis of aorta: Secondary | ICD-10-CM | POA: Diagnosis not present

## 2019-06-11 DIAGNOSIS — I358 Other nonrheumatic aortic valve disorders: Secondary | ICD-10-CM | POA: Diagnosis not present

## 2019-06-11 DIAGNOSIS — I2510 Atherosclerotic heart disease of native coronary artery without angina pectoris: Secondary | ICD-10-CM | POA: Diagnosis not present

## 2019-06-11 DIAGNOSIS — J432 Centrilobular emphysema: Secondary | ICD-10-CM | POA: Diagnosis not present

## 2019-06-11 DIAGNOSIS — C3492 Malignant neoplasm of unspecified part of left bronchus or lung: Secondary | ICD-10-CM | POA: Diagnosis not present

## 2019-06-11 DIAGNOSIS — J439 Emphysema, unspecified: Secondary | ICD-10-CM | POA: Diagnosis not present

## 2019-06-12 ENCOUNTER — Other Ambulatory Visit: Payer: Self-pay

## 2019-06-12 ENCOUNTER — Ambulatory Visit
Admission: RE | Admit: 2019-06-12 | Discharge: 2019-06-12 | Disposition: A | Discharge status: Home / Self Care | Payer: Medicare Other | Source: Ambulatory Visit | Attending: Urology | Admitting: Urology

## 2019-06-12 DIAGNOSIS — Z08 Encounter for follow-up examination after completed treatment for malignant neoplasm: Secondary | ICD-10-CM | POA: Diagnosis not present

## 2019-06-12 DIAGNOSIS — J441 Chronic obstructive pulmonary disease with (acute) exacerbation: Secondary | ICD-10-CM | POA: Diagnosis not present

## 2019-06-12 DIAGNOSIS — C3492 Malignant neoplasm of unspecified part of left bronchus or lung: Secondary | ICD-10-CM

## 2019-06-12 DIAGNOSIS — C3412 Malignant neoplasm of upper lobe, left bronchus or lung: Secondary | ICD-10-CM | POA: Diagnosis not present

## 2019-06-12 NOTE — Progress Notes (Signed)
Radiation Oncology         [phone] ________________________________  Name: Stephen Palmer MRN: [id number]  Date: 06/12/2019  DOB: [birth year]  Post Treatment Note- Conducted via telemedicine WebEx due to current COVID-19 concerns for limiting patient exposure  CC: Stephen Samples, PA-C  [name], MD  Diagnosis:    68 y.o. 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  12/13/16 - 02/09/17: Prostate IMRT ; 40 fractions to 78 Gy in combination with LT-ADT under the care of Dr. Lianne Cure in [place], [place].  Narrative:  I spoke with the patient via WebEx to spare the patient unnecessary potential exposure in the healthcare setting during the current COVID-19 pandemic.  The patient was notified in advance and gave permission to proceed with this visit format. He has recovered well from the effects of radiotherapy and is currently without complaints.  He had a recent CTA Chest on 05/15/19 as part of a workup for evaluation of left sided chest pain.  This showed a grossly stable appearance of the previously treated left upper lobe nodule, now measuring 10 x 10 mm.  There was no evidence of disease progression or metastatic disease.  Fortunately, all of his testing for cardiac evaluation turned out normal and he continues in routine follow-up with his vascular specialist in [facility] for monitoring of the known AAA which has reportedly remained stable.                             On review of systems, the patient states that he is doing well overall. He continues with a mild, occasional productive cough with clear to whitish sputum. He will occasionally have harsh coughing episodes that make it difficult to catch his breath and at times are painful.  He has continued using his inhalers as  prescribed by his PCP but wonders if he should see a lung specialist.  He specifically denies hemoptysis, increased shortness of breath, chest pain, fevers, chills or night sweats.  He continues with mild fatigue which is not progressively worseining.  He admits to having a very sedentary lifestyle despite encouragement from his wife and endocrinologist to be more active.  He denies abdominal pain, N/V or diarrhea.  He has a healthy appetite and is maintaining his weight.  ALLERGIES:  is allergic to sulfa antibiotics and sulfasalazine.  Meds: Current Outpatient Medications  Medication Sig Dispense Refill   Albuterol (VENTOLIN IN) Inhale into the lungs every 8 (eight) hours as needed.      alprazolam (XANAX) 2 MG tablet Take 2 mg by mouth 2 (two) times daily.     cyclobenzaprine (FLEXERIL) 10 MG tablet Take 10 mg by mouth 3 (three) times daily as needed.     doxycycline (VIBRAMYCIN) 100 MG capsule Take 1 capsule (100 mg total) by mouth 2 (two) times daily. 20 capsule 0   DULoxetine (CYMBALTA) 30 MG capsule Take 30 mg by mouth daily.     glipiZIDE (GLUCOTROL) 10 MG tablet Take 10 mg by mouth 2 (two) times daily before a meal.     Multiple Vitamins-Minerals (CENTRUM SILVER 50+MEN PO) Take by mouth every morning.     predniSONE (DELTASONE) 10 MG tablet  3 tabs for 3 days, 2 tabs for 3 days, 1 tab for 3 days. 18 tablet 0   tamsulosin (FLOMAX) 0.4 MG CAPS capsule      vitamin C (ASCORBIC ACID) 500 MG tablet Take 500 mg by mouth daily.     vitamin E 1000 UNIT capsule Take 1,000 Units by mouth daily.     No current facility-administered medications for this visit.     Physical Findings:  vitals were not taken for this visit.    In general this is a well appearing Caucasian male in no acute distress.  He's alert and oriented x4 and appropriate throughout the examination. Cardiopulmonary assessment is negative for acute distress and he exhibits normal effort.    Lab Findings: Lab Results    Component Value Date   WBC 10.5 05/15/2019   HGB 14.8 05/15/2019   HCT 46.4 05/15/2019   MCV 96.5 05/15/2019   PLT 263 05/15/2019     Radiographic Findings: Ct Angio Chest Pe W And/or Wo Contrast  Result Date: 05/15/2019 CLINICAL DATA:  Cough for 3 months lung mass EXAM: CT ANGIOGRAPHY CHEST WITH CONTRAST TECHNIQUE: Multidetector CT imaging of the chest was performed using the standard protocol during bolus administration of intravenous contrast. Multiplanar CT image reconstructions and MIPs were obtained to evaluate the vascular anatomy. CONTRAST:  127mL OMNIPAQUE IOHEXOL 350 MG/ML SOLN COMPARISON:  Radiograph 05/15/2019, CT chest 12/02/2018, 08/06/2018, 06/12/2018 FINDINGS: Cardiovascular: Satisfactory opacification of the pulmonary arteries to the segmental level. No evidence of pulmonary embolism. Ectatic aorta with moderate aortic atherosclerosis. Irregular mural thrombus at the distal arch and descending thoracic aorta. Coronary vascular calcification. Normal heart size. No pericardial effusion Mediastinum/Nodes: Midline trachea. No thyroid mass. No significant adenopathy. Mild air distention of the esophagus. Lungs/Pleura: Stable prominent emphysema with bullous disease at the apices. 10 x 10 mm spiculated left upper lobe lung nodule with surrounding bandlike density. No acute consolidation, pleural effusion or pneumothorax Upper Abdomen: No acute abnormality. Musculoskeletal: No chest wall abnormality. No acute or significant osseous findings. Review of the MIP images confirms the above findings. IMPRESSION: 1. Negative for acute pulmonary embolus. 2. Stable spiculated nodule in the left upper lobe measuring 10 mm in diameter. Background emphysema without acute pulmonary airspace disease. Aortic Atherosclerosis (ICD10-I70.0) and Emphysema (ICD10-J43.9). Electronically Signed   By: Donavan Foil M.D.   On: 05/15/2019 22:45   Dg Chest Port 1 View  Result Date: 05/15/2019 CLINICAL DATA:   Three-month history of cough and shortness of breath. History of lung cancer. EXAM: PORTABLE CHEST 1 VIEW COMPARISON:  Chest CT 12/02/2018 and chest x-ray 08/06/2018 FINDINGS: The cardiac silhouette, mediastinal and hilar contours are within normal limits and stable. Stable emphysematous changes and pulmonary scarring. Stable left upper lobe density likely combination of treated tumor in radiation change. No infiltrates or effusions. The bony thorax is intact. IMPRESSION: 1. No acute cardiopulmonary findings. 2. Emphysematous changes and pulmonary scarring. 3. Left upper lobe scarring changes and vague nodularity, stable. Electronically Signed   By: Marijo Sanes M.D.   On: 05/15/2019 19:08    Impression/Plan: 1.  68 y.o. 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    The patient continues to remain stable both physically and radiographically. We will continue with serial CT scans of the chest with contrast and BMP  performed at [facility] since this is closer to home in [place], [place] at six month intervals until 5  years when, at that point in time, he will have an annual low dose CT scan for surveillance. He will return in 6 months to review the findings from the scan to be ordered prior to that visit. He knows to call with any questions or concerns in the interim.  He is comfortable with this plan.  2. COPD.  It does not sound like his COPD is very well controlled on his current regimen of inhalers and therefore, I have encouraged him to discuss this further with his PCP regarding referral to a pulmonologist in [facility] for further evaluation and management.  He is in agreement.  Given current concerns for patient exposure during the COVID-19 pandemic, this encounter was conducted via [place]. The patient has given verbal consent for this type of encounter. The time spent during this encounter was 30 minutes. The attendants for this meeting include Stephen Boulter PA-C and patient, Stephen Palmer. During the encounter Stephen Chiles PA-C and patient, Stephen Palmer, were located at [facility] Radiation Oncology Department.     [name], PA-C

## 2019-06-25 ENCOUNTER — Ambulatory Visit: Payer: Medicare Other | Admitting: Vascular Surgery

## 2019-06-25 DIAGNOSIS — E1159 Type 2 diabetes mellitus with other circulatory complications: Secondary | ICD-10-CM | POA: Diagnosis not present

## 2019-06-26 LAB — COMPLETE METABOLIC PANEL WITH GFR
AG RATIO: 1.7 (calc) (ref 1.0–2.5)
ALT: 23 U/L (ref 9–46)
AST: 17 U/L (ref 10–35)
Albumin: 4.5 g/dL (ref 3.6–5.1)
Alkaline phosphatase (APISO): 111 U/L (ref 35–144)
BUN: 18 mg/dL (ref 7–25)
CALCIUM: 9.8 mg/dL (ref 8.6–10.3)
CO2: 27 mmol/L (ref 20–32)
CREATININE: 1.18 mg/dL (ref 0.70–1.25)
Chloride: 105 mmol/L (ref 98–110)
GFR, Est African American: 73 mL/min/{1.73_m2} (ref >=60)
GFR, Est Non African American: 63 mL/min/{1.73_m2} (ref >=60)
GLOBULIN: 2.7 g/dL (ref 1.9–3.7)
Glucose, Bld: 143 mg/dL — ABNORMAL HIGH (ref 65–99)
POTASSIUM: 5.3 mmol/L (ref 3.5–5.3)
SODIUM: 140 mmol/L (ref 135–146)
TOTAL PROTEIN: 7.2 g/dL (ref 6.1–8.1)
Total Bilirubin: 0.3 mg/dL (ref 0.2–1.2)

## 2019-06-26 LAB — HEMOGLOBIN A1C
Hgb A1c MFr Bld: 7.5 % of total Hgb — ABNORMAL HIGH (ref <5.7)
MEAN PLASMA GLUCOSE: 169 (calc)
eAG (mmol/L): 9.3 (calc)

## 2019-06-27 ENCOUNTER — Ambulatory Visit
Admission: RE | Admit: 2019-06-27 | Discharge: 2019-06-27 | Disposition: A | Discharge status: Home / Self Care | Payer: Medicare Other | Source: Ambulatory Visit | Attending: Vascular Surgery | Admitting: Vascular Surgery

## 2019-06-27 ENCOUNTER — Other Ambulatory Visit: Payer: Self-pay

## 2019-06-27 DIAGNOSIS — I712 Thoracic aortic aneurysm, without rupture, unspecified: Secondary | ICD-10-CM

## 2019-06-27 DIAGNOSIS — I716 Thoracoabdominal aortic aneurysm, without rupture: Secondary | ICD-10-CM | POA: Diagnosis not present

## 2019-06-27 MED ORDER — IOPAMIDOL (ISOVUE-370) INJECTION 76%
75.0000 mL | Freq: Once | INTRAVENOUS | Status: AC | PRN
  Administered 2019-06-27: 75 mL via INTRAVENOUS

## 2019-07-02 ENCOUNTER — Encounter: Payer: Self-pay | Admitting: Nutrition

## 2019-07-02 ENCOUNTER — Other Ambulatory Visit: Payer: Self-pay

## 2019-07-02 ENCOUNTER — Telehealth: Payer: Self-pay | Admitting: Nutrition

## 2019-07-02 ENCOUNTER — Other Ambulatory Visit: Payer: Self-pay | Admitting: "Endocrinology

## 2019-07-02 ENCOUNTER — Ambulatory Visit (INDEPENDENT_AMBULATORY_CARE_PROVIDER_SITE_OTHER): Payer: Medicare Other | Admitting: "Endocrinology

## 2019-07-02 ENCOUNTER — Encounter: Payer: Self-pay | Admitting: "Endocrinology

## 2019-07-02 ENCOUNTER — Encounter: Payer: Medicare Other | Attending: Family Medicine | Admitting: Nutrition

## 2019-07-02 DIAGNOSIS — E1159 Type 2 diabetes mellitus with other circulatory complications: Secondary | ICD-10-CM

## 2019-07-02 DIAGNOSIS — I2510 Atherosclerotic heart disease of native coronary artery without angina pectoris: Secondary | ICD-10-CM | POA: Diagnosis not present

## 2019-07-02 NOTE — Patient Instructions (Signed)
Goals Eat three meals per day Drink only lemon water Don't skip meals Increase fresh fruits and vegetables. Cut out processed meats of corn beef hash, sausage, bacon and junk food Eat meals on time. Talk to  MD about referral to pulmonologist.

## 2019-07-02 NOTE — Progress Notes (Signed)
  Medical Nutrition Therapy:  Appt start time: 1700end time:  1730  Assessment:  Primary concerns today: Diabetes Type 2. PMH: Hyperlipidemia, HTN.  Here with his wife. Had DM for 5 years. Has had a lot problems with his COPD. Had had steroids and antibiotics. FBS: 110-160's. Diet is high in fat, salt, and processed foods. Diet is low fresh fruits and vegetables.  Not walking. Struggling with making food choices that he knows he needs to eat.  Needs higher fiber diet due to hyperlipidemia..   Lab Results  Component Value Date   HGBA1C 7.5 (H) 06/25/2019   CMP Latest Ref Rng & Units 06/25/2019 05/15/2019 12/20/2018  Glucose 65 - 99 mg/dL 143(H) 133(H) 145(H)  BUN 7 - 25 mg/dL 18 18 16   Creatinine 0.70 - 1.25 mg/dL 1.18 1.17 1.15  Sodium 135 - 146 mmol/L 140 137 138  Potassium 3.5 - 5.3 mmol/L 5.3 4.2 4.8  Chloride 98 - 110 mmol/L 105 101 100  CO2 20 - 32 mmol/L 27 25 28   Calcium 8.6 - 10.3 mg/dL 9.8 8.9 9.8  Total Protein 6.1 - 8.1 g/dL 7.2 - 7.5  Total Bilirubin 0.2 - 1.2 mg/dL 0.3 - 0.4  AST 10 - 35 U/L 17 - 27  ALT 9 - 46 U/L 23 - 39   Lipid Panel     Component Value Date/Time   CHOL 275 (A) 07/09/2018   CHOL 225 (A) 07/09/2018   TRIG 244 (A) 07/09/2018   TRIG 187 (A) 07/09/2018   HDL 42 07/09/2018   HDL 36 07/09/2018   LDLCALC 184 07/09/2018   LDLCALC 152 07/09/2018     Preferred Learning Style:   No preference indicated   Learning Readiness:  Change in progress   MEDICATIONS:   DIETARY INTAKE: 24-hr recall:  B ( AM): Eggs, corn beef hash,  Snk ( AM):  L ( PM): skipped. Snk ( PM): D ( PM): 8-9) can't remember, Snk ( PM):  Beverages: water  Usual physical activity: ADL  Estimated energy needs: 1800-200- calories 200  g carbohydrates 135 g protein 50 g fat  Progress Towards Goal(s):  In progress.   Nutritional Diagnosis:  NB-1.1 Food and nutrition-related knowledge deficit As related to Diabetes and Hyperlipidemia.  As evidenced by A1C 7.3% and  elevated lipd levels..    Intervention:  Nutrition and Diabetes education provided on My Plate, CHO counting, meal planning, portion sizes, timing of meals, avoiding snacks between meals unless having a low blood sugar, target ranges for A1C and blood sugars, signs/symptoms and treatment of hyper/hypoglycemia, monitoring blood sugars, taking medications as prescribed, benefits of exercising 30 minutes per day and prevention of complications of DM.  Goals Eat three meals per day Drink only lemon water Don't skip meals Increase fresh fruits and vegetables. Cut out processed meats of corn beef hash, sausage, bacon and junk food Eat meals on time. Talk to  MD about referral to pulmonologist.  Teaching Method Utilized:  Visual Auditory Hands on  Handouts given during visit include:  The Plate Method   Meal Plan Card  Barriers to learning/adherence to lifestyle change: none  Demonstrated degree of understanding via:  Teach Back   Monitoring/Evaluation:  Dietary intake, exercise, meal planning , and body weight 6 months.Marland Kitchen

## 2019-07-02 NOTE — Telephone Encounter (Signed)
VM left to call to complete phone visit.

## 2019-07-02 NOTE — Progress Notes (Signed)
07/02/2019, 3:23 PM                                Endocrinology Telehealth Visit Follow up Note -During COVID -19 Pandemic  I connected with Stephen Palmer on 07/02/2019   by telephone and verified that I am speaking with the correct person using two identifiers. Stephen Palmer, December 08, 1951. he has verbally consented to this visit. All issues noted in this document were discussed and addressed. The format was not optimal for physical exam   Subjective:    Patient ID: Stephen Palmer, male    DOB: June 01, 1951.  Stephen Palmer is being engaged in telehealth for follow-up in the management of his currently  uncontrolled symptomatic type 2 diabetes requested by  [name], PA-C.   Past Medical History:  Diagnosis Date  . Anxiety   . Aortic aneurysm (HCC)    4.4 will have intervention when 5.5  . COPD (chronic obstructive pulmonary disease) ([place])   . Diabetes mellitus without complication ([place])   . Hypertension   . Prostate cancer ([place])   . Stroke [facility])    Past Surgical History:  Procedure Laterality Date  . COLONOSCOPY N/A 11/23/2015   Procedure: COLONOSCOPY;  Surgeon: Aviva Signs, MD;  Location: [facility];  Service: Gastroenterology;  Laterality: N/A;  . FRACTURE SURGERY    . PROSTATE BIOPSY    . SINUS SURGERY WITH INSTATRAK     Social History   Socioeconomic History  . Marital status: Married    Spouse name: Not on file  . Number of children: Not on file  . Years of education: Not on file  . Highest education level: Not on file  Occupational History  . Not on file  Social Needs  . Financial resource strain: Not on file  . Food insecurity    Worry: Not on file    Inability: Not on file  . Transportation needs    Medical: Not on file    Non-medical: Not on file  Tobacco Use  . Smoking status: Current Every Day Smoker    Packs/day: 2.00    Years: 44.00    Pack years: 88.00    Types: Cigarettes  . Smokeless tobacco: Never Used   Substance and Sexual Activity  . Alcohol use: No    Alcohol/week: 0.0 standard drinks  . Drug use: No  . Sexual activity: Never  Lifestyle  . Physical activity    Days per week: Not on file    Minutes per session: Not on file  . Stress: Not on file  Relationships  . Social Herbalist on phone: Not on file    Gets together: Not on file    Attends religious service: Not on file    Active member of club or organization: Not on file    Attends meetings of clubs or organizations: Not on file    Relationship status: Not on file  Other Topics Concern  . Not on file  Social History Narrative  . Not on file   Outpatient Encounter Medications as of 07/02/2019  Medication Sig  . Albuterol (VENTOLIN IN) Inhale into the lungs every 8 (eight) hours as needed.   Marland Kitchen alprazolam (XANAX) 2 MG tablet Take 2 mg by mouth 2 (two) times daily.  . cyclobenzaprine (FLEXERIL) 10 MG tablet Take 10 mg by mouth 3 (three)  times daily as needed.  . doxycycline (VIBRAMYCIN) 100 MG capsule Take 1 capsule (100 mg total) by mouth 2 (two) times daily.  . DULoxetine (CYMBALTA) 30 MG capsule Take 30 mg by mouth daily.  Marland Kitchen glipiZIDE (GLUCOTROL) 10 MG tablet Take 10 mg by mouth 2 (two) times daily before a meal.  . Multiple Vitamins-Minerals (CENTRUM SILVER 50+MEN PO) Take by mouth every morning.  . predniSONE (DELTASONE) 10 MG tablet 3 tabs for 3 days, 2 tabs for 3 days, 1 tab for 3 days.  . tamsulosin (FLOMAX) 0.4 MG CAPS capsule   . vitamin C (ASCORBIC ACID) 500 MG tablet Take 500 mg by mouth daily.  . vitamin E 1000 UNIT capsule Take 1,000 Units by mouth daily.   No facility-administered encounter medications on file as of 07/02/2019.     ALLERGIES: Allergies  Allergen Reactions  . Sulfa Antibiotics Hives, Itching and Swelling    Tongue swelling  . Sulfasalazine Hives, Itching and Swelling    Tongue swelling    VACCINATION STATUS:  There is no immunization history on file for this  patient.  Diabetes He presents for his follow-up diabetic visit. He has type 2 diabetes mellitus. Onset time: He was diagnosed at approximate age of 68 years. His disease course has been stable. There are no hypoglycemic associated symptoms. Pertinent negatives for hypoglycemia include no confusion, headaches, pallor or seizures. Associated symptoms include blurred vision. Pertinent negatives for diabetes include no chest pain, no fatigue, no polydipsia, no polyphagia, no polyuria and no weakness. There are no hypoglycemic complications. Symptoms are stable. Diabetic complications include a CVA. Risk factors for coronary artery disease include diabetes mellitus, hypertension, male sex and tobacco exposure. Current diabetic treatment includes oral agent (monotherapy) (Is currently taking glipizide 10 mg p.o. twice daily.). His weight is stable. He is following a generally unhealthy diet. When asked about meal planning, he reported none. He has not had a previous visit with a dietitian. He never participates in exercise. His breakfast blood glucose range is generally 130-140 mg/dl. His bedtime blood glucose range is generally 140-180 mg/dl. His overall blood glucose range is 130-140 mg/dl. (His previsit labs show A1c of 7.5%, remaining stable from his last visit when he was 7.1%.  ) An ACE inhibitor/angiotensin II receptor blocker is not being taken. Eye exam is current.     Review of Systems  Constitutional: Negative for chills, fatigue, fever and unexpected weight change.  HENT: Negative for dental problem, mouth sores and trouble swallowing.   Eyes: Positive for blurred vision. Negative for visual disturbance.  Respiratory: Positive for cough and shortness of breath. Negative for choking, chest tightness and wheezing.   Cardiovascular: Negative for chest pain, palpitations and leg swelling.  Gastrointestinal: Negative for abdominal distention, abdominal pain, constipation, diarrhea, nausea and  vomiting.  Endocrine: Negative for polydipsia, polyphagia and polyuria.  Genitourinary: Negative for dysuria, flank pain, hematuria and urgency.  Musculoskeletal: Negative for back pain, gait problem, myalgias and neck pain.  Skin: Negative for pallor, rash and wound.  Neurological: Negative for seizures, syncope, weakness, numbness and headaches.  Psychiatric/Behavioral: Negative for confusion and dysphoric mood.    Objective:    There were no vitals taken for this visit.  Wt Readings from Last 3 Encounters:  05/15/19 168 lb (76.2 kg)  01/27/19 167 lb (75.8 kg)  12/26/18 166 lb (75.3 kg)       Recent Results (from the past 2160 hour(s))  Basic metabolic panel     Status: Abnormal  Collection Time: 05/15/19  6:14 PM  Result Value Ref Range   Sodium 137 135 - 145 mmol/L   Potassium 4.2 3.5 - 5.1 mmol/L   Chloride 101 98 - 111 mmol/L   CO2 25 22 - 32 mmol/L   Glucose, Bld 133 (H) 70 - 99 mg/dL   BUN 18 8 - 23 mg/dL   Creatinine, Ser 1.17 0.61 - 1.24 mg/dL   Calcium 8.9 8.9 - 10.3 mg/dL   GFR calc non Af Amer >60 >60 mL/min   GFR calc Af Amer >60 >60 mL/min   Anion gap 11 5 - 15    Comment: Performed at [facility], [address] [place], [place] [address]  CBC     Status: None   Collection Time: 05/15/19  6:14 PM  Result Value Ref Range   WBC 10.5 4.0 - 10.5 K/uL   RBC 4.81 4.22 - 5.81 MIL/uL   Hemoglobin 14.8 13.0 - 17.0 g/dL   HCT 46.4 39.0 - 52.0 %   MCV 96.5 80.0 - 100.0 fL   MCH 30.8 26.0 - 34.0 pg   MCHC 31.9 30.0 - 36.0 g/dL   RDW 13.3 11.5 - 15.5 %   Platelets 263 150 - 400 K/uL   nRBC 0.0 0.0 - 0.2 %    Comment: Performed at [facility], [address] [place], [place] [address]  Troponin I - Once     Status: None   Collection Time: 05/15/19  6:14 PM  Result Value Ref Range   Troponin I <0.03 <0.03 ng/mL    Comment: Performed at [facility], [address], [place], [place] [address]  D-dimer, quantitative (not at [facility])     Status: Abnormal    Collection Time: 05/15/19  6:14 PM  Result Value Ref Range   D-Dimer, Quant 1.31 (H) 0.00 - 0.50 ug/mL-FEU    Comment: (NOTE) At the manufacturer cut-off of 0.50 ug/mL FEU, this assay has been documented to exclude PE with a sensitivity and negative predictive value of 97 to 99%.  At this time, this assay has not been approved by the FDA to exclude DVT/VTE. Results should be correlated with clinical presentation. Performed at [facility], [address] [place], [place] [address]   SARS Coronavirus 2 ([place]- Performed in [facility] hospital lab), Hosp Order     Status: None   Collection Time: 05/15/19  7:03 PM   Specimen: Nasopharyngeal Swab  Result Value Ref Range   SARS Coronavirus 2 NEGATIVE NEGATIVE    Comment: (NOTE) If result is NEGATIVE SARS-CoV-2 target nucleic acids are NOT DETECTED. The SARS-CoV-2 RNA is generally detectable in upper and lower  respiratory specimens during the acute phase of infection. The lowest  concentration of SARS-CoV-2 viral copies this assay can detect is 250  copies / mL. A negative result does not preclude SARS-CoV-2 infection  and should not be used as the sole basis for treatment or other  patient management decisions.  A negative result may occur with  improper specimen collection / handling, submission of specimen other  than nasopharyngeal swab, presence of viral mutation(s) within the  areas targeted by this assay, and inadequate number of viral copies  (<250 copies / mL). A negative result must be combined with clinical  observations, patient history, and epidemiological information. If result is POSITIVE SARS-CoV-2 target nucleic acids are DETECTED. The SARS-CoV-2 RNA is generally detectable in upper and lower  respiratory specimens dur ing the acute phase of infection.  Positive  results are indicative of active infection with SARS-CoV-2.  Clinical  correlation with patient history and other diagnostic information is  necessary to  determine patient infection status.  Positive results do  not rule out bacterial infection or co-infection with other viruses. If result is PRESUMPTIVE POSTIVE SARS-CoV-2 nucleic acids MAY BE PRESENT.   A presumptive positive result was obtained on the submitted specimen  and confirmed on repeat testing.  While 2019 novel coronavirus  (SARS-CoV-2) nucleic acids may be present in the submitted sample  additional confirmatory testing may be necessary for epidemiological  and / or clinical management purposes  to differentiate between  SARS-CoV-2 and other Sarbecovirus currently known to infect humans.  If clinically indicated additional testing with an alternate test  methodology [phone]) is advised. The SARS-CoV-2 RNA is generally  detectable in upper and lower respiratory sp ecimens during the acute  phase of infection. The expected result is Negative. Fact Sheet for Patients:  StrictlyIdeas.no Fact Sheet for Healthcare Providers: BankingDealers.co.za This test is not yet approved or cleared by the [place] FDA and has been authorized for detection and/or diagnosis of SARS-CoV-2 by FDA under an Emergency Use Authorization (EUA).  This EUA will remain in effect (meaning this test can be used) for the duration of the COVID-19 declaration under Section 564(b)(1) of the Act, 21 U.S.C. section 360bbb-3(b)(1), unless the authorization is terminated or revoked sooner. Performed at [facility], [address] [place], [place] [address]   Hemoglobin A1c     Status: Abnormal   Collection Time: 06/25/19 10:44 AM  Result Value Ref Range   Hgb A1c MFr Bld 7.5 (H) <5.7 % of total Hgb    Comment: For someone without known diabetes, a hemoglobin A1c value of 6.5% or greater indicates that they may have  diabetes and this should be confirmed with a follow-up  test. . For someone with known diabetes, a value <7% indicates  that their diabetes  is well controlled and a value  greater than or equal to 7% indicates suboptimal  control. A1c targets should be individualized based on  duration of diabetes, age, comorbid conditions, and  other considerations. . Currently, no consensus exists regarding use of hemoglobin A1c for diagnosis of diabetes for children. .    Mean Plasma Glucose 169 (calc)   eAG (mmol/L) 9.3 (calc)  COMPLETE METABOLIC PANEL WITH GFR     Status: Abnormal   Collection Time: 06/25/19 10:44 AM  Result Value Ref Range   Glucose, Bld 143 (H) 65 - 99 mg/dL    Comment: .            Fasting reference interval . For someone without known diabetes, a glucose value >125 mg/dL indicates that they may have diabetes and this should be confirmed with a follow-up test. .    BUN 18 7 - 25 mg/dL   Creat 1.18 0.70 - 1.25 mg/dL    Comment: For patients >19 years of age, the reference limit for Creatinine is approximately 13% higher for people identified as African-American. .    GFR, Est Non African American 63 > OR = 60 mL/min/1.18m2   GFR, Est African American 73 > OR = 60 mL/min/1.76m2   BUN/Creatinine Ratio NOT APPLICABLE 6 - 22 (calc)   Sodium 140 135 - 146 mmol/L   Potassium 5.3 3.5 - 5.3 mmol/L   Chloride 105 98 - 110 mmol/L   CO2 27 20 - 32 mmol/L   Calcium 9.8 8.6 - 10.3 mg/dL  Total Protein 7.2 6.1 - 8.1 g/dL   Albumin 4.5 3.6 - 5.1 g/dL   Globulin 2.7 1.9 - 3.7 g/dL (calc)   AG Ratio 1.7 1.0 - 2.5 (calc)   Total Bilirubin 0.3 0.2 - 1.2 mg/dL   Alkaline phosphatase (APISO) 111 35 - 144 U/L   AST 17 10 - 35 U/L   ALT 23 9 - 46 U/L      Assessment & Plan:   1. DM type 2 causing vascular disease ([place])  - Stephen Palmer has currently uncontrolled symptomatic type 2 DM since 68 years of age. -He reports significantly improved glycemic profile and A1c of 7.5%, overall improving from 11 point 7.    Recent labs are reviewed with him.   -his diabetes is complicated by CVA , chronic heavy smoking,  and he remains at extremely high risk for more acute and chronic complications which include CAD, CVA, CKD, retinopathy, and neuropathy. These are all discussed in detail with him.  - I have counseled him on diet management by adopting a carbohydrate restricted/protein rich diet.  - he  admits there is a room for improvement in his diet and drink choices. -  Suggestion is made for him to avoid simple carbohydrates  from his diet including Cakes, Sweet Desserts / Pastries, Ice Cream, Soda (diet and regular), Sweet Tea, Candies, Chips, Cookies, Sweet Pastries,  Store Bought Juices, Alcohol in Excess of  1-2 drinks a day, Artificial Sweeteners, Coffee Creamer, and "Sugar-free" Products. This will help patient to have stable blood glucose profile and potentially avoid unintended weight gain.  - I encouraged him to switch to  unprocessed or minimally processed complex starch and increased protein intake (animal or plant source), fruits, and vegetables.  - he is advised to stick to a routine mealtimes to eat 3 meals  a day and avoid unnecessary snacks ( to snack only to correct hypoglycemia).    - I have approached him with the following individualized plan to manage diabetes and patient agrees:   -Based on his presentation with near target glycemic profile and his clear reluctance to go on insulin, he will be kept on non-insulin treatment for now.   -He is benefiting and remaining stable on his current regimen of glipizide.  He is advised to continue  glipizide 10 mg p.o. twice daily with breakfast and supper, continue monitoring blood glucose at least 2 times daily-before breakfast and before supper and at any other time as needed. - he is encouraged to call clinic for blood glucose levels less than 70 or above 300 mg /dl.  -He is not a suitable candidate for metformin, SGLT2 inhibitors, nor incretin therapy. -He will be reapproached for insulin treatment if he loses control by next visit.  -  Patient specific target  A1c;  LDL, HDL, Triglycerides, and  Waist Circumference were discussed in detail.  2) BP/HTN:  he is advised to home monitor blood pressure and report if > 140/90 on 2 separate readings.   He is not on any antihypertensive medications, reported sulfa allergy.   3) Lipids/HPL: No recent lipid panel to review.  He is not on any statins.    4)  Weight/Diet: weight loss is not advisable for him.  CDE Consult will be initiated . Exercise, and detailed carbohydrates information provided  -  detailed on discharge instructions.  5) Chronic Care/Health Maintenance:  -he  Is not on ACEI/ARB and Statin medications and  is encouraged to initiate and continue to  follow up with Ophthalmology, Dentist,  Podiatrist at least yearly or according to recommendations, and advised to  Quit smoking (this is his #1health risk). I have recommended yearly flu vaccine and pneumonia vaccine at least every 5 years; moderate intensity exercise for up to 150 minutes weekly; and  sleep for at least 7 hours a day.  - I advised patient to maintain close follow up with [name], PA-C for primary care needs.  - Patient Care Time Today:  25 min, of which >50% was spent in reviewing his  current and  previous labs/studies, previous treatments, and medications doses and developing a plan for long-term care based on the latest recommendations for standards of care.  Stephen Palmer participated in the discussions, expressed understanding, and voiced agreement with the above plans.  All questions were answered to his satisfaction. he is encouraged to contact clinic should he have any questions or concerns prior to his return visit.   Follow up plan: - Return in about 6 months (around 01/02/2020) for Follow up with Pre-visit Labs, Meter, and Logs.  Glade Lloyd, MD [facility] [address] [place], [place] [address] Phone: [phone]  Fax:  [phone]    07/02/2019, 3:23 PM  This note was partially dictated with voice recognition software. Similar sounding words can be transcribed inadequately or may not  be corrected upon review.

## 2019-07-02 NOTE — Telephone Encounter (Signed)
No answer. No VM

## 2019-07-08 ENCOUNTER — Telehealth: Payer: Self-pay | Admitting: *Deleted

## 2019-07-08 NOTE — Telephone Encounter (Signed)
Virtual Visit Pre-Appointment Phone Call  Today, I spoke with Stephen Palmer and performed the following actions:  1. I explained that we are currently trying to limit exposure to the COVID-19 virus by seeing patients at home rather than in the office.  I explained that the visits are best done by video, but can be done by telephone.  I asked the patient if a virtual visit that the patient would like to try instead of coming into the office. Stephen Palmer agreed to proceed with the virtual visit scheduled with Dr.Christopher Scot Dock on 07/09/19.     2. I confirmed the BEST phone number to call the day of the visit and- I included this in appointment notes.  3. I asked if the patient had access to (through a family member/friend) a smartphone with video capability to be used for his visit?"  The patient said yes -   4. I confirmed consent by  a. sending through [place] or by email the [place] as written at the end of this message or  b. verbally as listed below. i. This visit is being performed in the setting of COVID-19. ii. All virtual visits are billed to your insurance company just like a normal visit would be.   iii. We'd like you to understand that the technology does not allow for your provider to perform an examination, and thus may limit your provider's ability to fully assess your condition.  iv. If your provider identifies any concerns that need to be evaluated in person, we will make arrangements to do so.   v. Finally, though the technology is pretty good, we cannot assure that it will always work on either your or our end, and in the setting of a video visit, we may have to convert it to a phone-only visit.  In either situation, we cannot ensure that we have a secure connection.   vi. Are you willing to proceed?"  STAFF: Did the patient verbally acknowledge consent to telehealth visit? Document YES/NO here:YES  2. I advised the patient to  be prepared - I asked that the patient, on the day of his visit, record any information possible with the equipment at his home, such as blood pressure, pulse, oxygen saturation, and your weight and write them all down. I asked the patient to have a pen and paper handy nearby the day of the visit as well.  3. If the patient was scheduled for a video visit, I informed the patient that the visit with the doctor would start with a text to the smartphone # given to [place] by the patient.         If the patient was scheduled for a telephone call, I informed the patient that the visit with the doctor would start with a call to the telephone # given to [place] by the patient.  4. I Informed patient they will receive a phone call 15 minutes prior to their appointment time from a [place] or nurse to review medications, allergies, etc. to prepare for the visit.    TELEPHONE CALL NOTE  Stephen Palmer has been deemed a candidate for a follow-up tele-health visit to limit community exposure during the Covid-19 pandemic. I spoke with the patient via phone to ensure availability of phone/video source, confirm preferred email & phone number, and discuss instructions and expectations.  I reminded Stephen Palmer to be prepared with any vital sign and/or heart rhythm  information that could potentially be obtained via home monitoring, at the time of his visit. I reminded Stephen Palmer to expect a phone call prior to his visit.  Cleaster Corin, NT 07/08/2019 11:17 AM     FULL LENGTH CONSENT FOR TELE-HEALTH VISIT   I hereby voluntarily request, consent and authorize [facility] and its employed or contracted physicians, physician assistants, nurse practitioners or other licensed health care professionals (the Practitioner), to provide me with telemedicine health care services (the "Services") as deemed necessary by the treating Practitioner. I acknowledge and consent to receive the Services by the Practitioner via  telemedicine. I understand that the telemedicine visit will involve communicating with the Practitioner through live audiovisual communication technology and the disclosure of certain medical information by electronic transmission. I acknowledge that I have been given the opportunity to request an in-person assessment or other available alternative prior to the telemedicine visit and am voluntarily participating in the telemedicine visit.  I understand that I have the right to withhold or withdraw my consent to the use of telemedicine in the course of my care at any time, without affecting my right to future care or treatment, and that the Practitioner or I may terminate the telemedicine visit at any time. I understand that I have the right to inspect all information obtained and/or recorded in the course of the telemedicine visit and may receive copies of available information for a reasonable fee.  I understand that some of the potential risks of receiving the Services via telemedicine include:  Marland Kitchen Delay or interruption in medical evaluation due to technological equipment failure or disruption; . Information transmitted may not be sufficient (e.g. poor resolution of images) to allow for appropriate medical decision making by the Practitioner; and/or  . In rare instances, security protocols could fail, causing a breach of personal health information.  Furthermore, I acknowledge that it is my responsibility to provide information about my medical history, conditions and care that is complete and accurate to the best of my ability. I acknowledge that Practitioner's advice, recommendations, and/or decision may be based on factors not within their control, such as incomplete or inaccurate data provided by me or distortions of diagnostic images or specimens that may result from electronic transmissions. I understand that the practice of medicine is not an exact science and that Practitioner makes no warranties or  guarantees regarding treatment outcomes. I acknowledge that I will receive a copy of this consent concurrently upon execution via email to the email address I last provided but may also request a printed copy by calling the office of [facility].    I understand that my insurance will be billed for this visit.   I have read or had this consent read to me. . I understand the contents of this consent, which adequately explains the benefits and risks of the Services being provided via telemedicine.  . I have been provided ample opportunity to ask questions regarding this consent and the Services and have had my questions answered to my satisfaction. . I give my informed consent for the services to be provided through the use of telemedicine in my medical care  By participating in this telemedicine visit I agree to the above.

## 2019-07-09 ENCOUNTER — Encounter: Payer: Self-pay | Admitting: Vascular Surgery

## 2019-07-09 ENCOUNTER — Other Ambulatory Visit: Payer: Self-pay

## 2019-07-09 ENCOUNTER — Ambulatory Visit (INDEPENDENT_AMBULATORY_CARE_PROVIDER_SITE_OTHER): Payer: Medicare Other | Admitting: Vascular Surgery

## 2019-07-09 DIAGNOSIS — I716 Thoracoabdominal aortic aneurysm, without rupture, unspecified: Secondary | ICD-10-CM

## 2019-07-09 NOTE — Progress Notes (Signed)
Patient name: Stephen Palmer DOB: 1951-02-26 Sex: male    Referring Provider is [name], [place]*  PCP is [name], PA-C  REASON FOR VIRTUAL VISIT: Follow-up of type IV thoracoabdominal aneurysm and aneurysm of ascending aorta.   I connected with Stephen Palmer on 07/09/19 at  1:20 PM EDT by a video enabled telemedicine application and verified that I am speaking with the correct person using two identifiers. I discussed the limitations of evaluation and management by telemedicine and the availability of in person appointments. The patient expressed understanding and agreed to proceed.  Location: Patient: Home Provider: Office  I attempted to establish a video connection but this was unsuccessful so we converted this to a phone visit.  HPI: Stephen Palmer is a 68 y.o. male who I last saw on 12/25/2018.  This patient has a known type IV thoracoabdominal aneurysm.  I last saw him on 12/25/2018.  At that time, CT angiogram of the chest abdomen and pelvis showed that the ascending aorta measured 3.3 cm in maximum diameter.  The type IV thoracoabdominal aneurysm measures 5.2 cm in maximum diameter and had not changed significantly in 6 months.  I explained that we would normally not consider elective repair unless it reaches 5.5 cm in maximum diameter.  I also explained that given that this is a type IV thoracoabdominal aneurysm we would be referring him to [facility] to evaluate him for a fenestrated graft versus an open thoracoabdominal approach.  He wanted to continue his follow-up here.  Of note he is a heavy smoker and we did discuss the importance of tobacco cessation.  In addition the patient has a 3.3 cm ascending aortic aneurysm.  This was stable in size also.  I explained that if this did continue to enlarge we would refer him for cardiothoracic evaluation.  The patient denies any abdominal pain.  He does have degenerative disc disease of the spine and does note some  chronic low back pain when he is ambulating.  He also complains of some knee pain.  He does not describe any significant claudication although I suspect his activity is somewhat limited because of his back pain and COPD.  Unfortunately he does continue to smoke 2 packs/day of cigarettes and we have had multiple conversations about the importance of trying to quit given that this does increase his risk of aneurysm enlargement.  He has had a previous stroke and has some residual left-sided weakness but gets around otherwise reasonably well.  Current Outpatient Medications  Medication Sig Dispense Refill  . Albuterol (VENTOLIN IN) Inhale into the lungs every 8 (eight) hours as needed.     Marland Kitchen alprazolam (XANAX) 2 MG tablet Take 2 mg by mouth 2 (two) times daily.    . cyclobenzaprine (FLEXERIL) 10 MG tablet Take 10 mg by mouth 3 (three) times daily as needed.    . DULoxetine (CYMBALTA) 30 MG capsule Take 30 mg by mouth daily.    Marland Kitchen glipiZIDE (GLUCOTROL) 10 MG tablet Take 10 mg by mouth 2 (two) times daily before a meal.    . Multiple Vitamins-Minerals (CENTRUM SILVER 50+MEN PO) Take by mouth every morning.    . tamsulosin (FLOMAX) 0.4 MG CAPS capsule     . vitamin C (ASCORBIC ACID) 500 MG tablet Take 500 mg by mouth daily.    . vitamin E 1000 UNIT capsule Take 1,000 Units by mouth daily.     No current facility-administered medications for this visit.  REVIEW OF SYSTEMS: Valu.Nieves ] denotes positive finding; [  ] denotes negative finding  CARDIOVASCULAR:  [ ]  chest pain   [ ]  dyspnea on exertion  [ ]  leg swelling  CONSTITUTIONAL:  [ ]  fever   [ ]  chills   OBSERVATIONS/OBJECTIVE: There were no vitals filed for this visit.  The patient did not appear short of breath over the phone.  DATA:  CT ANGIOGRAM CHEST ABDOMEN PELVIS: I have reviewed the images of his CT angiogram that was done on 06/27/2019.  His ascending aortic aneurysm has not changed in size.  There is some slight enlargement of the  descending thoracic aorta which measures 3.1 cm in maximum diameter near the diaphragmatic hiatus.  The infrarenal component of the aneurysm measures 5.2 cm in maximum diameter which has not changed.  Of note there is an interval decrease in size of a spiculated nodule of the left upper lobe.  The patient is undergone radiation therapy.  The nodule measures 7 mm compared to 10 mm previously.  MEDICAL ISSUES:  5.2 CM TYPE IV THORACOABDOMINAL ANEURYSM: His aneurysm is stable in size at 5.2 cm.  There is been no significant change over the last 6 months.  I have again discussed with him the importance of tobacco cessation given that continued tobacco use does increase his risk of aneurysm expansion and rupture.  He also has significant COPD which be another reason to try to quit.  He understands that the aneurysm enlarged we would send him to [facility] to be evaluated for a fenestrated graft.  If this were not possible I explained that we do not do thoracoabdominal approaches here and this would also be done in [facility].  He would like to continue his follow-up here rather than drive to [facility] I think this is reasonable.  Given that the aneurysm is been very stable I think it is reasonable to stretch his follow-up out to 9 months given the risk of CT scans every 6 months.  3.4 CM ASCENDING AORTIC ANEURYSM: He does have some enlargement of his ascending aorta which is stable.  If this enlarge significantly I explained that we would refer him to the cardiothoracic surgeons for further evaluation.  I will arrange a follow-up CT angiogram of the chest abdomen and pelvis in 9 months.  He will call sooner with any problems or questions.  FOLLOW UP INSTRUCTIONS:   I discussed the assessment and treatment plan with the patient. The patient was provided an opportunity to ask questions and all were answered. The patient agreed with the plan and demonstrated an understanding of the instructions. The patient  was advised to call back or seek an in-person evaluation if the symptoms worsen or if the condition fails to improve as anticipated.  I provided 22 minutes of non-face-to-face time during this encounter.    Deitra Mayo [facility]

## 2019-07-23 DIAGNOSIS — E1165 Type 2 diabetes mellitus with hyperglycemia: Secondary | ICD-10-CM | POA: Diagnosis not present

## 2019-07-23 DIAGNOSIS — Z0001 Encounter for general adult medical examination with abnormal findings: Secondary | ICD-10-CM | POA: Diagnosis not present

## 2019-07-23 DIAGNOSIS — Z6824 Body mass index (BMI) 24.0-24.9, adult: Secondary | ICD-10-CM | POA: Diagnosis not present

## 2019-07-23 DIAGNOSIS — Z1389 Encounter for screening for other disorder: Secondary | ICD-10-CM | POA: Diagnosis not present

## 2019-07-23 DIAGNOSIS — E119 Type 2 diabetes mellitus without complications: Secondary | ICD-10-CM | POA: Diagnosis not present

## 2019-09-11 DIAGNOSIS — Z85828 Personal history of other malignant neoplasm of skin: Secondary | ICD-10-CM | POA: Diagnosis not present

## 2019-09-11 DIAGNOSIS — L821 Other seborrheic keratosis: Secondary | ICD-10-CM | POA: Diagnosis not present

## 2019-09-11 DIAGNOSIS — L570 Actinic keratosis: Secondary | ICD-10-CM | POA: Diagnosis not present

## 2019-11-12 DIAGNOSIS — C61 Malignant neoplasm of prostate: Secondary | ICD-10-CM | POA: Diagnosis not present

## 2019-11-19 ENCOUNTER — Ambulatory Visit (INDEPENDENT_AMBULATORY_CARE_PROVIDER_SITE_OTHER): Payer: Medicare Other | Admitting: Urology

## 2019-11-19 DIAGNOSIS — N529 Male erectile dysfunction, unspecified: Secondary | ICD-10-CM | POA: Diagnosis not present

## 2019-11-19 DIAGNOSIS — R351 Nocturia: Secondary | ICD-10-CM | POA: Diagnosis not present

## 2019-11-19 DIAGNOSIS — C61 Malignant neoplasm of prostate: Secondary | ICD-10-CM | POA: Diagnosis not present

## 2019-12-02 ENCOUNTER — Telehealth: Payer: Self-pay

## 2019-12-05 ENCOUNTER — Telehealth: Payer: Self-pay | Admitting: *Deleted

## 2019-12-05 NOTE — Telephone Encounter (Signed)
Called patient to inform of [facility] on 12-16-19 @ 12:30 pm @ [facility]) and his CT to follow @ 1:30 pm @ [facility]), pt. to be NPO- 4 hrs. Prior to test, patient to receive results from [place] on 12-18-19 @ 1 pm via telephone, lvm for a return call

## 2019-12-08 ENCOUNTER — Telehealth: Payer: Self-pay | Admitting: *Deleted

## 2019-12-08 NOTE — Telephone Encounter (Signed)
Returned patient's phone call, lvm for a return call 

## 2019-12-11 ENCOUNTER — Other Ambulatory Visit: Payer: Self-pay | Admitting: *Deleted

## 2019-12-11 NOTE — Patient Outreach (Signed)
[facility]) Care Management  12/11/2019  Stephen Palmer 02/02/1951 [id number]   Telephone Screen  Referral Date:  12/02/2019 Referral Source:  EMMI Prevent Reason for Referral:  Screening Insurance:  Medicare   Outreach Attempt:  Outreach attempt #1 to patient for telephone screening. No answer. RN Health Coach left HIPAA compliant voicemail message along with contact information.  Plan:  RN Health Coach will send unsuccessful outreach letter to patient.  RN Health Coach will make another outreach attempt to patient within 3-4 business days if no return call back from patient.   [place] [phone] [email or web address]

## 2019-12-16 ENCOUNTER — Other Ambulatory Visit: Payer: Self-pay | Admitting: *Deleted

## 2019-12-16 NOTE — Patient Outreach (Signed)
[facility]) Care Management  12/16/2019  Stephen Palmer [birth year] [id number]   Telephone Screen  Referral Date:  12/02/2019 Referral Source:  EMMI Prevent Reason for Referral:  Screening Insurance:  Medicare   Outreach Attempt:  Successful telephone outreach to patient for telephone screening.  HIPAA verified with patient.  [facility] services reviewed and discussed.  Patient requesting information be mailed to him for review.  Declines services at this time.  Encouraged patient to contact [facility] in the future if needs arise.  Plan:  RN Health Coach will send patient Successful Outreach Letter with [facility].  RN Health Coach will close case and make patient inactive with [facility] due to patient declining services at this time.  [place] [phone] [email or web address]

## 2019-12-17 ENCOUNTER — Encounter: Payer: Self-pay | Admitting: *Deleted

## 2019-12-17 NOTE — Progress Notes (Signed)
Radiation Oncology         ([phone] ________________________________  Name: Stephen Palmer MRN: [id number]  Date: 12/18/2019  DOB: Oct 05, 1951  Post Treatment Note- Conducted via telemedicine WebEx due to current COVID-19 concerns for limiting patient exposure  CC: Stephen Samples, PA-C  [name], MD  Diagnosis:    68 y.o. male with 3 cm squamous cell carcinoma of the left upper lobe of the lung - Stage IA     Interval Since Last Radiation:  2 year, 2 months  Curative, Definitive SBRT:  10/05/2017, 10/09/2017, 10/12/2017- The target was treated to 54 Gy in 3 fractions of 18 Gy.  12/13/16 - 02/09/17: Prostate IMRT ; 40 fractions to 78 Gy in combination with LT-ADT under the care of Dr. Lianne Cure in [place], [place].  Narrative:  I spoke with the patient via WebEx to spare the patient unnecessary potential exposure in the healthcare setting during the current COVID-19 pandemic.  The patient was notified in advance and gave permission to proceed with this visit format. He has recovered well from the effects of radiotherapy and remains without complaints.  He had a recent CT Chest on 12/16/19 which showed a slight interval decrease in size of the previously treated left upper lobe nodule, now measuring 9 mm and no evidence of disease progression or metastatic disease. He continues in routine follow-up with his vascular specialist in [facility] for monitoring of the known AAA which has remained radiographically stable.                             On review of systems, the patient states that he is doing well overall. He continues with a mild, occasional productive cough with clear to whitish sputum. He will occasionally have harsh coughing episodes that make it difficult to catch his breath and at times are painful.  He has continued using his inhalers as prescribed by his PCP but wonders if he should see a lung specialist.  He specifically denies hemoptysis, increased shortness of breath, chest pain,  fevers, chills or night sweats.  He continues with mild fatigue which is not progressively worseining.  He admits to having a very sedentary lifestyle despite encouragement from his wife and endocrinologist to be more active.  He denies abdominal pain, N/V or diarrhea.  He has a healthy appetite and is maintaining his weight. He reports that since we saw him last, he has had a stroke which he has recovered from fully.  ALLERGIES:  is allergic to sulfa antibiotics and sulfasalazine.  Meds: Current Outpatient Medications  Medication Sig Dispense Refill  . Albuterol (VENTOLIN IN) Inhale into the lungs every 8 (eight) hours as needed.     Marland Kitchen alprazolam (XANAX) 2 MG tablet Take 2 mg by mouth 2 (two) times daily.    . cyclobenzaprine (FLEXERIL) 10 MG tablet Take 10 mg by mouth 3 (three) times daily as needed.    . DULoxetine (CYMBALTA) 30 MG capsule Take 30 mg by mouth daily.    Marland Kitchen glipiZIDE (GLUCOTROL) 10 MG tablet Take 10 mg by mouth 2 (two) times daily before a meal.    . Multiple Vitamins-Minerals (CENTRUM SILVER 50+MEN PO) Take by mouth every morning.    . tamsulosin (FLOMAX) 0.4 MG CAPS capsule     . vitamin C (ASCORBIC ACID) 500 MG tablet Take 500 mg by mouth daily.    . vitamin E 1000 UNIT capsule Take 1,000 Units by  mouth daily.     No current facility-administered medications for this visit.    Physical Findings:  vitals were not taken for this visit.   Unable to assess due to telephone follow-up visit format.  Lab Findings: Lab Results  Component Value Date   WBC 10.5 05/15/2019   HGB 14.8 05/15/2019   HCT 46.4 05/15/2019   MCV 96.5 05/15/2019   PLT 263 05/15/2019     Radiographic Findings: No results found.  Impression/Plan: 1.  68 y.o. 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    The patient continues to remain stable both physically and radiographically. We will continue with serial CT scans of the chest with contrast  and BMP performed at [facility] since this is closer to home in [place], [place] at six month intervals until 5 years when, at that point in time, he will have an annual low dose CT scan for surveillance. He will return in 6 months to review the findings from the scan to be ordered prior to that visit. He knows to call with any questions or concerns in the interim.  He is comfortable with this plan.  2. Infrarenal Abdominal Aortic Aneurysm: stable on recent imaging and followed by vascular surgeon in [place]. We will continue to monitor of follow up scans.  Given current concerns for patient exposure during the COVID-19 pandemic, this encounter was conducted via [place]. The patient has given verbal consent for this type of encounter. The time spent during this encounter was 30 minutes. The attendants for this meeting include [name] PA-C and patient, [name]. During the encounter [name] PA-C and patient, [name], were located at [facility] Radiation Oncology Department.     [name], PA-C

## 2019-12-18 ENCOUNTER — Other Ambulatory Visit: Payer: Self-pay

## 2019-12-18 ENCOUNTER — Ambulatory Visit
Admission: RE | Admit: 2019-12-18 | Discharge: 2019-12-18 | Disposition: A | Discharge status: Home / Self Care | Payer: Medicare Other | Source: Ambulatory Visit | Attending: Urology | Admitting: Urology

## 2019-12-18 ENCOUNTER — Encounter: Payer: Self-pay | Admitting: Urology

## 2019-12-18 DIAGNOSIS — C3492 Malignant neoplasm of unspecified part of left bronchus or lung: Secondary | ICD-10-CM

## 2019-12-18 DIAGNOSIS — Z08 Encounter for follow-up examination after completed treatment for malignant neoplasm: Secondary | ICD-10-CM | POA: Diagnosis not present

## 2019-12-18 DIAGNOSIS — I714 Abdominal aortic aneurysm, without rupture: Secondary | ICD-10-CM | POA: Diagnosis not present

## 2019-12-18 DIAGNOSIS — C3412 Malignant neoplasm of upper lobe, left bronchus or lung: Secondary | ICD-10-CM | POA: Diagnosis not present

## 2019-12-31 DIAGNOSIS — E1159 Type 2 diabetes mellitus with other circulatory complications: Secondary | ICD-10-CM | POA: Diagnosis not present

## 2020-01-01 LAB — COMPLETE METABOLIC PANEL WITH GFR
AG Ratio: 1.7 (calc) (ref 1.0–2.5)
ALT: 28 U/L (ref 9–46)
AST: 20 U/L (ref 10–35)
Albumin: 4.4 g/dL (ref 3.6–5.1)
Alkaline phosphatase (APISO): 115 U/L (ref 35–144)
BUN: 17 mg/dL (ref 7–25)
CO2: 31 mmol/L (ref 20–32)
Calcium: 9.3 mg/dL (ref 8.6–10.3)
Chloride: 103 mmol/L (ref 98–110)
Creat: 1.2 mg/dL (ref 0.70–1.25)
GFR, Est African American: 72 mL/min/{1.73_m2} (ref >=60)
GFR, Est Non African American: 62 mL/min/{1.73_m2} (ref >=60)
Globulin: 2.6 g/dL (calc) (ref 1.9–3.7)
Glucose, Bld: 142 mg/dL — ABNORMAL HIGH (ref 65–139)
Potassium: 4.8 mmol/L (ref 3.5–5.3)
Sodium: 140 mmol/L (ref 135–146)
Total Bilirubin: 0.3 mg/dL (ref 0.2–1.2)
Total Protein: 7 g/dL (ref 6.1–8.1)

## 2020-01-01 LAB — HEMOGLOBIN A1C
Hgb A1c MFr Bld: 7.2 % of total Hgb — ABNORMAL HIGH (ref <5.7)
Mean Plasma Glucose: 160 (calc)
eAG (mmol/L): 8.9 (calc)

## 2020-01-02 ENCOUNTER — Encounter: Payer: Self-pay | Admitting: "Endocrinology

## 2020-01-02 ENCOUNTER — Ambulatory Visit (INDEPENDENT_AMBULATORY_CARE_PROVIDER_SITE_OTHER): Payer: Medicare Other | Admitting: "Endocrinology

## 2020-01-02 DIAGNOSIS — E1159 Type 2 diabetes mellitus with other circulatory complications: Secondary | ICD-10-CM

## 2020-01-02 NOTE — Progress Notes (Signed)
01/02/2020, 1:10 PM                                Endocrinology Telehealth Visit Follow up Note -During COVID -19 Pandemic  I connected with Stephen Palmer on 01/02/2020   by telephone and verified that I am speaking with the correct person using two identifiers. Stephen Palmer, February 22, 1951. he has verbally consented to this visit. All issues noted in this document were discussed and addressed. The format was not optimal for physical exam   Subjective:    Patient ID: Stephen Palmer, male    DOB: [birth year].  Stephen Palmer is being engaged in telehealth for follow-up in the management of his currently  uncontrolled symptomatic type 2 diabetes requested by  [name], PA-C.   Past Medical History:  Diagnosis Date  . Anxiety   . Aortic aneurysm (HCC)    4.4 will have intervention when 5.5  . COPD (chronic obstructive pulmonary disease) ([place])   . Diabetes mellitus without complication ([place])   . Hypertension   . Prostate cancer ([place])   . Stroke [facility])    Past Surgical History:  Procedure Laterality Date  . COLONOSCOPY N/A 11/23/2015   Procedure: COLONOSCOPY;  Surgeon: Aviva Signs, MD;  Location: [facility];  Service: Gastroenterology;  Laterality: N/A;  . FRACTURE SURGERY    . PROSTATE BIOPSY    . SINUS SURGERY WITH INSTATRAK     Social History   Socioeconomic History  . Marital status: Married    Spouse name: Not on file  . Number of children: Not on file  . Years of education: Not on file  . Highest education level: Not on file  Occupational History  . Not on file  Tobacco Use  . Smoking status: Current Every Day Smoker    Packs/day: 2.00    Years: 44.00    Pack years: 88.00    Types: Cigarettes  . Smokeless tobacco: Never Used  Substance and Sexual Activity  . Alcohol use: No    Alcohol/week: 0.0 standard drinks  . Drug use: No  . Sexual activity: Never  Other Topics Concern  . Not on file  Social History Narrative  .  Not on file   Social Determinants of Health   Financial Resource Strain:   . Difficulty of Paying Living Expenses: Not on file  Food Insecurity:   . Worried About Charity fundraiser in the Last Year: Not on file  . Ran Out of Food in the Last Year: Not on file  Transportation Needs:   . Lack of Transportation (Medical): Not on file  . Lack of Transportation (Non-Medical): Not on file  Physical Activity:   . Days of Exercise per Week: Not on file  . Minutes of Exercise per Session: Not on file  Stress:   . Feeling of Stress : Not on file  Social Connections:   . Frequency of Communication with Friends and Family: Not on file  . Frequency of Social Gatherings with Friends and Family: Not on file  . Attends Religious Services: Not on file  . Active Member of Clubs or Organizations: Not on file  . Attends Archivist Meetings: Not on file  . Marital Status: Not on file   Outpatient Encounter Medications as of 01/02/2020  Medication Sig  . Albuterol (VENTOLIN IN) Inhale into the lungs every  8 (eight) hours as needed.   Marland Kitchen alprazolam (XANAX) 2 MG tablet Take 2 mg by mouth 2 (two) times daily.  . cyclobenzaprine (FLEXERIL) 10 MG tablet Take 10 mg by mouth 3 (three) times daily as needed.  . DULoxetine (CYMBALTA) 30 MG capsule Take 30 mg by mouth daily.  Marland Kitchen glipiZIDE (GLUCOTROL) 10 MG tablet Take 10 mg by mouth 2 (two) times daily before a meal.  . magnesium 30 MG tablet Take 400 mg by mouth daily.  . Multiple Vitamins-Minerals (CENTRUM SILVER 50+MEN PO) Take by mouth every morning.  . tamsulosin (FLOMAX) 0.4 MG CAPS capsule   . vitamin C (ASCORBIC ACID) 500 MG tablet Take 500 mg by mouth daily.  . vitamin E 1000 UNIT capsule Take 1,000 Units by mouth daily.  Marland Kitchen zinc gluconate 50 MG tablet Take 50 mg by mouth daily.   No facility-administered encounter medications on file as of 01/02/2020.    ALLERGIES: Allergies  Allergen Reactions  . Sulfa Antibiotics Hives, Itching and  Swelling    Tongue swelling  . Sulfasalazine Hives, Itching and Swelling    Tongue swelling    VACCINATION STATUS:  There is no immunization history on file for this patient.  Diabetes He presents for his follow-up diabetic visit. He has type 2 diabetes mellitus. Onset time: He was diagnosed at approximate age of 4 years. His disease course has been stable. There are no hypoglycemic associated symptoms. Pertinent negatives for hypoglycemia include no confusion, headaches, pallor or seizures. Associated symptoms include blurred vision. Pertinent negatives for diabetes include no chest pain, no fatigue, no polydipsia, no polyphagia, no polyuria and no weakness. There are no hypoglycemic complications. Symptoms are stable. Diabetic complications include a CVA. Risk factors for coronary artery disease include diabetes mellitus, hypertension, male sex and tobacco exposure. Current diabetic treatment includes oral agent (monotherapy) (Is currently taking glipizide 10 mg p.o. twice daily.). His weight is fluctuating minimally. He is following a generally unhealthy diet. When asked about meal planning, he reported none. He has not had a previous visit with a dietitian. He never participates in exercise. His breakfast blood glucose range is generally 110-130 mg/dl. His bedtime blood glucose range is generally 130-140 mg/dl. His overall blood glucose range is 130-140 mg/dl. (He reports his glycemic profile are near target, no hypoglycemia.  His previsit labs show A1c of 7.2%.) An ACE inhibitor/angiotensin II receptor blocker is not being taken. Eye exam is current.   Review of systems: Limited as above.    Objective:    There were no vitals taken for this visit.  Wt Readings from Last 3 Encounters:  05/15/19 168 lb (76.2 kg)  01/27/19 167 lb (75.8 kg)  12/26/18 166 lb (75.3 kg)       Recent Results (from the past 2160 hour(s))  Hemoglobin A1c     Status: Abnormal   Collection Time: 12/31/19   1:00 PM  Result Value Ref Range   Hgb A1c MFr Bld 7.2 (H) <5.7 % of total Hgb    Comment: For someone without known diabetes, a hemoglobin A1c value of 6.5% or greater indicates that they may have  diabetes and this should be confirmed with a follow-up  test. . For someone with known diabetes, a value <7% indicates  that their diabetes is well controlled and a value  greater than or equal to 7% indicates suboptimal  control. A1c targets should be individualized based on  duration of diabetes, age, comorbid conditions, and  other considerations. . Currently,  no consensus exists regarding use of hemoglobin A1c for diagnosis of diabetes for children. .    Mean Plasma Glucose 160 (calc)   eAG (mmol/L) 8.9 (calc)  COMPLETE METABOLIC PANEL WITH GFR     Status: Abnormal   Collection Time: 12/31/19  1:00 PM  Result Value Ref Range   Glucose, Bld 142 (H) 65 - 139 mg/dL    Comment: .        Non-fasting reference interval .    BUN 17 7 - 25 mg/dL   Creat 1.20 0.70 - 1.25 mg/dL    Comment: For patients >88 years of age, the reference limit for Creatinine is approximately 13% higher for people identified as African-American. .    GFR, Est Non African American 62 > OR = 60 mL/min/1.62m2   GFR, Est African American 72 > OR = 60 mL/min/1.35m2   BUN/Creatinine Ratio NOT APPLICABLE 6 - 22 (calc)   Sodium 140 135 - 146 mmol/L   Potassium 4.8 3.5 - 5.3 mmol/L   Chloride 103 98 - 110 mmol/L   CO2 31 20 - 32 mmol/L   Calcium 9.3 8.6 - 10.3 mg/dL   Total Protein 7.0 6.1 - 8.1 g/dL   Albumin 4.4 3.6 - 5.1 g/dL   Globulin 2.6 1.9 - 3.7 g/dL (calc)   AG Ratio 1.7 1.0 - 2.5 (calc)   Total Bilirubin 0.3 0.2 - 1.2 mg/dL   Alkaline phosphatase (APISO) 115 35 - 144 U/L   AST 20 10 - 35 U/L   ALT 28 9 - 46 U/L      Assessment & Plan:   1. DM type 2 causing vascular disease ([place])  - Stephen Palmer has currently uncontrolled symptomatic type 2 DM since 69 years of age. -He continued to  have near target glycemic profile.  He had no major hypoglycemia.  His previsit labs show A1c of 7.2%, improving from 11.7% overall.    Recent labs are reviewed with him.   -his diabetes is complicated by CVA , chronic heavy smoking, and he remains at extremely high risk for more acute and chronic complications which include CAD, CVA, CKD, retinopathy, and neuropathy. These are all discussed in detail with him.  - I have counseled him on diet management by adopting a carbohydrate restricted/protein rich diet.  - he  admits there is a room for improvement in his diet and drink choices. -  Suggestion is made for him to avoid simple carbohydrates  from his diet including Cakes, Sweet Desserts / Pastries, Ice Cream, Soda (diet and regular), Sweet Tea, Candies, Chips, Cookies, Sweet Pastries,  Store Bought Juices, Alcohol in Excess of  1-2 drinks a day, Artificial Sweeteners, Coffee Creamer, and "Sugar-free" Products. This will help patient to have stable blood glucose profile and potentially avoid unintended weight gain.   - I encouraged him to switch to  unprocessed or minimally processed complex starch and increased protein intake (animal or plant source), fruits, and vegetables.  - he is advised to stick to a routine mealtimes to eat 3 meals  a day and avoid unnecessary snacks ( to snack only to correct hypoglycemia).    - I have approached him with the following individualized plan to manage diabetes and patient agrees:   -Based on his presentation with near target glycemic profile and his clear reluctance to go on insulin, he will be kept on non-insulin treatment for now.   -He is responding and benefiting from glipizide treatment.  He is advised  to  continue glipizide  10 mg p.o. twice daily with breakfast and supper, continue monitoring blood glucose at least 2 times daily-before breakfast and before supper and at any other time as needed. - he is encouraged to call clinic for blood glucose  levels less than 70 or above 300 mg /dl.  -He is not a suitable candidate for metformin, SGLT2 inhibitors, nor incretin therapy. -He will be reapproached for insulin treatment if he loses control by next visit.  - Patient specific target  A1c;  LDL, HDL, Triglycerides, and  Waist Circumference were discussed in detail.  2) BP/HTN: he is advised to home monitor blood pressure and report if > 140/90 on 2 separate readings.    He is not on any antihypertensive medications, reported sulfa allergy.   3) Lipids/HPL: No recent lipid panel to review.  He is not on any statins.    4)  Weight/Diet: weight loss is not advisable for him.  CDE Consult will be initiated . Exercise, and detailed carbohydrates information provided  -  detailed on discharge instructions.  5) Chronic Care/Health Maintenance:  -he  Is not on ACEI/ARB and Statin medications and  is encouraged to initiate and continue to follow up with Ophthalmology, Dentist,  Podiatrist at least yearly or according to recommendations, and advised to  Quit smoking (this is his #1health risk). I have recommended yearly flu vaccine and pneumonia vaccine at least every 5 years; moderate intensity exercise for up to 150 minutes weekly; and  sleep for at least 7 hours a day.  The patient was counseled on the dangers of tobacco use, and was advised to quit.  Reviewed strategies to maximize success, including removing cigarettes and smoking materials from environment.   - I advised patient to maintain close follow up with [name], PA-C for primary care needs.  - Time spent on this patient care encounter:  35 min, of which >50% was spent in  counseling and the rest reviewing his  current and  previous labs/studies ( including abstraction from other facilities),  previous treatments, his blood glucose readings, and medications' doses and developing a plan for long-term care based on the latest recommendations for standards of care; and  documenting his care.  Stephen Palmer participated in the discussions, expressed understanding, and voiced agreement with the above plans.  All questions were answered to his satisfaction. he is encouraged to contact clinic should he have any questions or concerns prior to his return visit.    Follow up plan: - Return in about 6 months (around 07/01/2020) for Bring Meter and Logs- A1c in Office, Include 8 log sheets.  Glade Lloyd, MD [facility] [address] [place], [place] [address] Phone: [phone]  Fax: [phone]    01/02/2020, 1:10 PM  This note was partially dictated with voice recognition software. Similar sounding words can be transcribed inadequately or may not  be corrected upon review.

## 2020-01-06 ENCOUNTER — Ambulatory Visit: Payer: Medicare Other | Admitting: "Endocrinology

## 2020-01-06 ENCOUNTER — Other Ambulatory Visit: Payer: Self-pay

## 2020-01-06 ENCOUNTER — Encounter: Payer: Self-pay | Admitting: Nutrition

## 2020-01-06 ENCOUNTER — Encounter: Payer: Medicare Other | Attending: Family Medicine | Admitting: Nutrition

## 2020-01-06 DIAGNOSIS — E118 Type 2 diabetes mellitus with unspecified complications: Secondary | ICD-10-CM | POA: Diagnosis not present

## 2020-01-06 DIAGNOSIS — E1165 Type 2 diabetes mellitus with hyperglycemia: Secondary | ICD-10-CM | POA: Insufficient documentation

## 2020-01-06 DIAGNOSIS — C3492 Malignant neoplasm of unspecified part of left bronchus or lung: Secondary | ICD-10-CM | POA: Diagnosis not present

## 2020-01-06 DIAGNOSIS — E782 Mixed hyperlipidemia: Secondary | ICD-10-CM | POA: Diagnosis not present

## 2020-01-06 DIAGNOSIS — IMO0002 Reserved for concepts with insufficient information to code with codable children: Secondary | ICD-10-CM

## 2020-01-06 DIAGNOSIS — N183 Chronic kidney disease, stage 3 unspecified: Secondary | ICD-10-CM | POA: Diagnosis not present

## 2020-01-06 NOTE — Progress Notes (Signed)
  Medical Nutrition Therapy:  Appt start time: 1120end time:  1884 Assessment:  Primary concerns today: Diabetes Type 2. PMH: Hyperlipidemia, HTN.  BS are doing much better. A1C 7.2% down from 11.5%. Glipizide 10 mg BID. Sees Dr. Dorris Fetch, Endocrinology.  He stopped niacin. Admits to staying late up at night and sleeps in and only eating 1-2 meals per day. He says he has cut out a lot of processed foods but still eating cornbeef hash and bacon and biscuits.  Trying to eat more fruit and vegetables. Needs a higher fiber diet and more antioxidant foods. NO longer on steroids for his COPD.  Lab Results  Component Value Date   HGBA1C 7.2 (H) 12/31/2019   CMP Latest Ref Rng & Units 12/31/2019 06/25/2019 05/15/2019  Glucose 65 - 139 mg/dL 142(H) 143(H) 133(H)  BUN 7 - 25 mg/dL 17 18 18   Creatinine 0.70 - 1.25 mg/dL 1.20 1.18 1.17  Sodium 135 - 146 mmol/L 140 140 137  Potassium 3.5 - 5.3 mmol/L 4.8 5.3 4.2  Chloride 98 - 110 mmol/L 103 105 101  CO2 20 - 32 mmol/L 31 27 25   Calcium 8.6 - 10.3 mg/dL 9.3 9.8 8.9  Total Protein 6.1 - 8.1 g/dL 7.0 7.2 -  Total Bilirubin 0.2 - 1.2 mg/dL 0.3 0.3 -  AST 10 - 35 U/L 20 17 -  ALT 9 - 46 U/L 28 23 -   Lipid Panel     Component Value Date/Time   CHOL 275 (A) 07/09/2018 0000   CHOL 225 (A) 07/09/2018 0000   TRIG 244 (A) 07/09/2018 0000   TRIG 187 (A) 07/09/2018 0000   HDL 42 07/09/2018 0000   HDL 36 07/09/2018 0000   LDLCALC 184 07/09/2018 0000   LDLCALC 152 07/09/2018 0000     Preferred Learning Style:   No preference indicated   Learning Readiness:  Change in progress   MEDICATIONS:   DIETARY INTAKE: 24-hr recall:  B ( AM):  Coffee,  Snk ( AM):  L ( PM): eggs, corn bee fhash and ibuisti Snk ( PM): D ( PM): crackers and chese.  Snk ( PM):  Beverages: water  Usual physical activity: ADL  Estimated energy needs: 1800-200- calories 200  g carbohydrates 135 g protein 50 g fat  Progress Towards Goal(s):  In  progress.   Nutritional Diagnosis:  NB-1.1 Food and nutrition-related knowledge deficit As related to Diabetes and Hyperlipidemia.  As evidenced by A1C 7.3% and elevated lipd levels..    Intervention:  Nutrition and Diabetes education provided on My Plate, CHO counting, meal planning, portion sizes, timing of meals, avoiding snacks between meals unless having a low blood sugar, target ranges for A1C and blood sugars, signs/symptoms and treatment of hyper/hypoglycemia, monitoring blood sugars, taking medications as prescribed, benefits of exercising 30 minutes per day and prevention of complications of DM.  Goals  Eat three balanced meals. Go to bed by 11 pm. Don't skip meals Stop eating cornbeef hash and canned and processed meats Increase fresh fruits and vegetables. Drink only water Get A1C to 6.5% or less  Teaching Method Utilized:  Visual Auditory Hands on  Handouts given during visit include:  The Plate Method   Meal Plan Card  Barriers to learning/adherence to lifestyle change: none  Demonstrated degree of understanding via:  Teach Back   Monitoring/Evaluation:  Dietary intake, exercise, meal planning , and body weight 6 months.Marland Kitchen

## 2020-01-06 NOTE — Patient Instructions (Signed)
Goals  Eat three balanced meals. Go to bed by 11 pm. Don't skip meals Stop eating cornbeef hash and canned and processed meats Increase fresh fruits and vegetables. Drink only water Get A1C to 6.5% or less

## 2020-03-10 DIAGNOSIS — Z23 Encounter for immunization: Secondary | ICD-10-CM | POA: Diagnosis not present

## 2020-03-11 ENCOUNTER — Other Ambulatory Visit: Payer: Self-pay

## 2020-03-11 DIAGNOSIS — I712 Thoracic aortic aneurysm, without rupture, unspecified: Secondary | ICD-10-CM

## 2020-04-07 ENCOUNTER — Ambulatory Visit: Payer: Medicare Other | Admitting: Vascular Surgery

## 2020-04-12 ENCOUNTER — Ambulatory Visit
Admission: RE | Admit: 2020-04-12 | Discharge: 2020-04-12 | Disposition: A | Discharge status: Home / Self Care | Payer: Medicare Other | Source: Ambulatory Visit | Attending: Vascular Surgery | Admitting: Vascular Surgery

## 2020-04-12 DIAGNOSIS — F329 Major depressive disorder, single episode, unspecified: Secondary | ICD-10-CM | POA: Diagnosis not present

## 2020-04-12 DIAGNOSIS — Z85118 Personal history of other malignant neoplasm of bronchus and lung: Secondary | ICD-10-CM | POA: Diagnosis not present

## 2020-04-12 DIAGNOSIS — I712 Thoracic aortic aneurysm, without rupture, unspecified: Secondary | ICD-10-CM

## 2020-04-12 DIAGNOSIS — Z7984 Long term (current) use of oral hypoglycemic drugs: Secondary | ICD-10-CM | POA: Diagnosis not present

## 2020-04-12 DIAGNOSIS — J449 Chronic obstructive pulmonary disease, unspecified: Secondary | ICD-10-CM | POA: Diagnosis not present

## 2020-04-12 DIAGNOSIS — Z79899 Other long term (current) drug therapy: Secondary | ICD-10-CM | POA: Diagnosis not present

## 2020-04-12 DIAGNOSIS — M19012 Primary osteoarthritis, left shoulder: Secondary | ICD-10-CM | POA: Diagnosis not present

## 2020-04-12 DIAGNOSIS — Z8546 Personal history of malignant neoplasm of prostate: Secondary | ICD-10-CM | POA: Diagnosis not present

## 2020-04-12 DIAGNOSIS — F17200 Nicotine dependence, unspecified, uncomplicated: Secondary | ICD-10-CM | POA: Diagnosis not present

## 2020-04-12 DIAGNOSIS — E119 Type 2 diabetes mellitus without complications: Secondary | ICD-10-CM | POA: Diagnosis not present

## 2020-04-12 DIAGNOSIS — F419 Anxiety disorder, unspecified: Secondary | ICD-10-CM | POA: Diagnosis not present

## 2020-04-12 DIAGNOSIS — M25512 Pain in left shoulder: Secondary | ICD-10-CM | POA: Diagnosis not present

## 2020-04-12 MED ORDER — IOPAMIDOL (ISOVUE-370) INJECTION 76%
75.0000 mL | Freq: Once | INTRAVENOUS | Status: AC | PRN
  Administered 2020-04-12: 11:00:00 75 mL via INTRAVENOUS

## 2020-04-14 ENCOUNTER — Encounter: Payer: Self-pay | Admitting: Vascular Surgery

## 2020-04-14 ENCOUNTER — Ambulatory Visit (INDEPENDENT_AMBULATORY_CARE_PROVIDER_SITE_OTHER): Payer: Medicare Other | Admitting: Vascular Surgery

## 2020-04-14 ENCOUNTER — Other Ambulatory Visit: Payer: Self-pay

## 2020-04-14 DIAGNOSIS — R911 Solitary pulmonary nodule: Secondary | ICD-10-CM | POA: Diagnosis not present

## 2020-04-14 DIAGNOSIS — I712 Thoracic aortic aneurysm, without rupture, unspecified: Secondary | ICD-10-CM

## 2020-04-14 DIAGNOSIS — I716 Thoracoabdominal aortic aneurysm, without rupture, unspecified: Secondary | ICD-10-CM

## 2020-04-14 DIAGNOSIS — F17210 Nicotine dependence, cigarettes, uncomplicated: Secondary | ICD-10-CM

## 2020-04-14 NOTE — Progress Notes (Signed)
Patient name: Stephen Palmer DOB: 05-29-1951 Sex: male    Referring Provider is [name], [place]*  PCP is [name], PA-C  REASON FOR VIRTUAL VISIT: Follow-up of thoracoabdominal aneurysm and aneurysm of ascending aorta  I connected with [name] on 04/14/20 at  3:00 PM EDT by a video enabled telemedicine application and verified that I am speaking with the correct person using two identifiers. I discussed the limitations of evaluation and management by telemedicine and the availability of in person appointments. The patient expressed understanding and agreed to proceed.  Location: Patient: Home Provider: Office  HPI: [name] is a 69 y.o. male who I last saw on 07/09/2019 via a virtual visit.  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  We discussed the importance of tobacco cessation.  We have previously discussed that if the aneurysm enlarges I would recommend referral to [facility] to be evaluated for a fenestrated graft.  I explained that we do not do thoracoabdominal aneurysm repair is here.  Given that his aneurysm has been stable in size he wished to continue his follow-up here for now.  He also had a 3.4 cm ascending aortic aneurysm which has been stable in size.  We discussed referral to the cardiothoracic surgeons if this enlarge.  I set him up for a 54-month follow-up CT scan.  Patient states that he is doing well.  He does have some chronic low back pain but no new abdominal pain or back pain.  He did get the J&J vaccine in March with his wife.  He did not have any problems with that.  He does continue to smoke 2 packs/day of cigarettes.   Current Outpatient Medications  Medication Sig Dispense Refill  . Albuterol (VENTOLIN IN) Inhale into the lungs every 8 (eight) hours as needed.     Marland Kitchen alprazolam (XANAX) 2 MG tablet Take 2 mg by mouth 2 (two) times daily.    . cyclobenzaprine (FLEXERIL) 10 MG tablet Take 10 mg by mouth  3 (three) times daily as needed.    . DULoxetine (CYMBALTA) 30 MG capsule Take 30 mg by mouth daily.    Marland Kitchen glipiZIDE (GLUCOTROL) 10 MG tablet Take 10 mg by mouth 2 (two) times daily before a meal.    . magnesium 30 MG tablet Take 400 mg by mouth daily.    . Multiple Vitamins-Minerals (CENTRUM SILVER 50+MEN PO) Take by mouth every morning.    . tamsulosin (FLOMAX) 0.4 MG CAPS capsule     . vitamin C (ASCORBIC ACID) 500 MG tablet Take 500 mg by mouth daily.    . vitamin E 1000 UNIT capsule Take 1,000 Units by mouth daily.    Marland Kitchen zinc gluconate 50 MG tablet Take 50 mg by mouth daily.     No current facility-administered medications for this visit.   REVIEW OF SYSTEMS: Valu.Nieves ] denotes positive finding; [  ] denotes negative finding  CARDIOVASCULAR:  [ ]  chest pain   [ ]  dyspnea on exertion  [ ]  leg swelling  CONSTITUTIONAL:  [ ]  fever   [ ]  chills   OBSERVATIONS/OBJECTIVE:  There were no vitals filed for this visit.   The patient was alert and oriented.  He was not short of breath on the phone.  DATA:  CT CHEST: His CT of the chest shows that the maximum diameter of his ascending aorta is 3.2 cm which has not changed in the last  year.  He is noted to have a 0.9 cm left upper lobe nodule which had increased in size from 0.7 cm 9 months ago.  Consideration for a PET scan was discussed.  CT ANGIO ABDOMEN PELVIS: His CT angiogram of the abdomen pelvis shows that the maximum diameter of his thoracoabdominal aneurysm is 5.3 cm which is not changed significantly compared to 5.2 cm 9 months ago.  He was noted to have a lymph node overlying the right external iliac artery measured which measured 1.5 cm in maximum diameter.  MEDICAL ISSUES:  5.3 CM THORACOABDOMINAL ANEURYSM: This patient's thoracoabdominal aneurysm is stable in size at 5.3 cm.  If this enlarge significantly we have discussed sending him to [facility] to be evaluated for either a fenestrated graft or a thoracoabdominal repair.  As the  aneurysm is stable in size he would like to continue his follow-up care for now.  We have again discussed the importance of tobacco cessation.  I think it would be reasonable to get an ultrasound in 6 months to try to cut back on the CT scans if possible.  He will need a CT angiogram in 1 year.  I will arrange for the ultrasound here in the office and see him in 6 months.  3.2 CM ASCENDING AORTIC ANEURYSM: His ascending aorta is stable in size at 3.2 cm.  We have discussed referring him to the cardiothoracic surgeons if this increases significantly in size.  Follow-up study in 1 year is recommended we will obtain a CT angiogram at that time.  LEFT UPPER LOBE NODULE: There is been a slight increase in size of his left upper lobe nodule.  I will send a note to the patient's primary care physician to consider further follow-up of this.  1.5 CM RIGHT EXTERNAL ILIAC ARTERY LYMPH NODE: He was noted to have an enlarged lymph node overlying the right external iliac artery.  The patient tells me that he is due to have his PSA checked in the near future but he has not had any history of prostate cancer or elevated PSAs.  FOLLOW UP INSTRUCTIONS:   I discussed the assessment and treatment plan with the patient. The patient was provided an opportunity to ask questions and all were answered. The patient agreed with the plan and demonstrated an understanding of the instructions. The patient was advised to call back or seek an in-person evaluation if the symptoms worsen or if the condition fails to improve as anticipated.  I provided 18 minutes of non-face-to-face time during this encounter.  Level 1  (99441)             5-10 minutes Level 2  (99442)            11-20 minutes Level 3  (99443)            21-30 minutes  Deitra Mayo [facility]

## 2020-04-15 ENCOUNTER — Other Ambulatory Visit: Payer: Self-pay | Admitting: *Deleted

## 2020-04-15 DIAGNOSIS — I712 Thoracic aortic aneurysm, without rupture, unspecified: Secondary | ICD-10-CM

## 2020-05-03 ENCOUNTER — Encounter: Payer: Self-pay | Admitting: Urology

## 2020-05-03 DIAGNOSIS — Z1389 Encounter for screening for other disorder: Secondary | ICD-10-CM | POA: Diagnosis not present

## 2020-05-03 DIAGNOSIS — J984 Other disorders of lung: Secondary | ICD-10-CM | POA: Diagnosis not present

## 2020-05-03 DIAGNOSIS — Z6824 Body mass index (BMI) 24.0-24.9, adult: Secondary | ICD-10-CM | POA: Diagnosis not present

## 2020-05-03 DIAGNOSIS — I714 Abdominal aortic aneurysm, without rupture: Secondary | ICD-10-CM | POA: Diagnosis not present

## 2020-05-03 DIAGNOSIS — C61 Malignant neoplasm of prostate: Secondary | ICD-10-CM | POA: Diagnosis not present

## 2020-05-05 ENCOUNTER — Encounter: Payer: Self-pay | Admitting: Urology

## 2020-05-05 ENCOUNTER — Telehealth: Payer: Self-pay | Admitting: Radiation Oncology

## 2020-05-05 ENCOUNTER — Other Ambulatory Visit: Payer: Self-pay | Admitting: Urology

## 2020-05-05 DIAGNOSIS — C3492 Malignant neoplasm of unspecified part of left bronchus or lung: Secondary | ICD-10-CM

## 2020-05-05 NOTE — Progress Notes (Signed)
I have reviewed his recent CT Angio C/A/P scans from 04/14/2020.  Aldona Bar, RN will call the patient to advise that the previously treated LUL lung nodule that we follow him for appears stable, with some mild progression of radiation changes but no findings to suggest disease progression or recurrence.  He does not need another CT Chest scan in June since we have this recent one, so I have cancelled the order for scan in June and will put in a new order for a repeat scan in 10/2020 instead, with plans to see him back for follow up thereafter to review results.  Dr. Scot Dock will continue to follow him for the AAA and infrarenal abdominal aneurysm. There is a new, enlarged pelvic lymph node that needs further evaluation with his urologist, given his prior history of high risk prostate cancer that was treated with ADT and XRT in [place] back in 2018. It appears that his urologist is Dr. Alinda Money but I am not sure how long it has been since he has seen him for PSA- no recent records scanned in our EMR. We will confirm who his urologist is and ensure that he gets a follow up appointment, ASAP to determine recommended further evaluation of the new pelvic node and will send a copy of the CT Angio A/P report to his urologist so that they are aware of urgency for follow up.  I am also going to copy his PCP, Dr. Sharilyn Sites of [facility] on this discussion to keep him in the loop.  [name], MMS, PA-C [place] [facility] [place]: [phone]  Fax: [phone]

## 2020-05-05 NOTE — Telephone Encounter (Signed)
Phoned patient as requested by Freeman Caldron, PA-C. Explained the following per Ashlyn's direction: The previously treated lung nodule that we follow him for appears stable, with some mild progression of radiation changes but no findings to suggest disease progression or recurrence.  He does not need another CT Chest scan in June since we have this recent one, so I have cancelled the order for scan in June and will put in a new order for a repeat scan in 10/2020 instead, with plans to see him back for follow up thereafter to review results.  Dr. Scot Dock will continue to follow him for the AAA and infrarenal abdominal aneurysm.   Also, I let him know that there is a new, enlarged pelvic lymph node that needs further evaluation with his urologist, given his prior history of high risk prostate cancer that was treated with ADT and XRT in [place] back in 2018. Patient confirms his urologist is Dr. Alyson Ingles. Patient reports he is already scheduled to follow up with Dr. Alyson Ingles on 05/19/2020 at 2pm. Patient goes onto explain he scheduled the 6/2 appointment with McKenzie to discuss severe fatigue and persistent burning pain in her low back and abdomen worse when walking. Stress again the need for urgent evaluation of these enlarged lymph nodes. Patient verbalized understanding.   Freeman Caldron, PA-C requested this RN send a copy of the CT Angio A/P to the urologist. Since the urologist has been confirmed as Dr. Alyson Ingles this isn't necessary since he has access to Epic.   Encouraged patient to call with future needs. Patient verbalized understanding. Explained that Enid Derry, our scheduler, will be in contact with him mid to late October to schedule his next CT and follow up. Patient verbalized understanding.

## 2020-05-10 ENCOUNTER — Other Ambulatory Visit: Payer: Self-pay

## 2020-05-10 DIAGNOSIS — C61 Malignant neoplasm of prostate: Secondary | ICD-10-CM

## 2020-05-11 ENCOUNTER — Other Ambulatory Visit: Payer: Self-pay | Admitting: Urology

## 2020-05-11 DIAGNOSIS — C61 Malignant neoplasm of prostate: Secondary | ICD-10-CM | POA: Diagnosis not present

## 2020-05-12 LAB — TESTOSTERONE: Testosterone: 166 ng/dL — ABNORMAL LOW (ref 250–827)

## 2020-05-12 LAB — PSA: PSA: 8.7 ng/mL — ABNORMAL HIGH (ref <=4.0)

## 2020-05-19 ENCOUNTER — Other Ambulatory Visit: Payer: Self-pay | Admitting: Urology

## 2020-05-19 ENCOUNTER — Encounter: Payer: Self-pay | Admitting: Urology

## 2020-05-19 ENCOUNTER — Other Ambulatory Visit: Payer: Self-pay

## 2020-05-19 ENCOUNTER — Ambulatory Visit (INDEPENDENT_AMBULATORY_CARE_PROVIDER_SITE_OTHER): Payer: Medicare Other | Admitting: Urology

## 2020-05-19 VITALS — BP 146/89 | HR 93 | Temp 97.7°F | Ht 69.0 in | Wt 166.0 lb

## 2020-05-19 DIAGNOSIS — R351 Nocturia: Secondary | ICD-10-CM | POA: Diagnosis not present

## 2020-05-19 DIAGNOSIS — N5201 Erectile dysfunction due to arterial insufficiency: Secondary | ICD-10-CM

## 2020-05-19 DIAGNOSIS — C61 Malignant neoplasm of prostate: Secondary | ICD-10-CM

## 2020-05-19 LAB — BASIC METABOLIC PANEL
BUN: 14 mg/dL (ref 7–25)
CO2: 28 mmol/L (ref 20–32)
Calcium: 9.8 mg/dL (ref 8.6–10.3)
Chloride: 104 mmol/L (ref 98–110)
Creat: 1.13 mg/dL (ref 0.70–1.25)
Glucose, Bld: 88 mg/dL (ref 65–139)
Potassium: 4.7 mmol/L (ref 3.5–5.3)
Sodium: 140 mmol/L (ref 135–146)

## 2020-05-19 LAB — PSA: PSA: 9.7 ng/mL — ABNORMAL HIGH (ref <=4.0)

## 2020-05-19 MED ORDER — TAMSULOSIN HCL 0.4 MG PO CAPS: 0.4000 mg | ORAL_CAPSULE | Freq: Every day | ORAL | 11 refills | Status: DC

## 2020-05-19 NOTE — Progress Notes (Signed)
Urological Symptom Review  Patient is experiencing the following symptoms: Hard to postpone urination Leakage of urine Erection problems (male only) Penile pain (male only)    Review of Systems  Gastrointestinal (upper)  : Negative for upper GI symptoms  Gastrointestinal (lower) : Negative for lower GI symptoms  Constitutional : Negative for symptoms  Skin: Negative for skin symptoms  Eyes: Blurred vision  Ear/Nose/Throat : Negative for Ear/Nose/Throat symptoms  Hematologic/Lymphatic: Swollen glands  Cardiovascular : Negative for cardiovascular symptoms  Respiratory : Negative for respiratory symptoms  Endocrine: Excessive thirst  Musculoskeletal: Back pain Joint pain  Neurological: Dizziness  Psychologic: Negative for psychiatric symptoms

## 2020-05-19 NOTE — Patient Instructions (Signed)
Prostate Cancer  The prostate is a male gland that helps make semen. Prostate cancer is when abnormal cells grow in this gland. Follow these instructions at home:  Take over-the-counter and prescription medicines only as told by your doctor.  Eat a healthy diet.  Get plenty of sleep.  Ask your doctor for help to find a support group for men with prostate cancer.  Keep all follow-up visits as told by your doctor. This is important.  If you have to go to the hospital, let your cancer doctor (oncologist) know.  Touch, hold, hug, and caress your partner to continue to show sexual feelings. Contact a doctor if:  You have trouble peeing (urinating).  You have blood in your pee (urine).  You have pain in your hips, back, or chest. Get help right away if:  You have weakness in your legs.  You lose feeling (have numbness) in your legs.  You cannot control your pee or your poop (stool).  You have trouble breathing.  You have sudden pain in your chest.  You have chills or a fever. Summary  The prostate is a male gland that helps make semen. Prostate cancer is when abnormal cells grow in this gland.  Ask your doctor for help to find a support group for men with prostate cancer.  Contact a doctor if you have problems peeing or have any new pain that you did not have before. This information is not intended to replace advice given to you by your health care provider. Make sure you discuss any questions you have with your health care provider. Document Revised: 11/16/2017 Document Reviewed: 08/14/2016 Elsevier Patient Education  2020 Elsevier Inc.  

## 2020-05-19 NOTE — Progress Notes (Signed)
05/19/2020 2:39 PM   Stephen Palmer [birth year] [id number]  Referring provider: [name], PA-C [address] [place],  [place] [address]  prostate cancer  HPI: [name] is a 69JI here for followup for prostate cancer, nocturia and ED. His PSA increased to 8.7 from 0.9. no worsening bone pain. He has stable mild LUTS on flomax. He took tadalafil for ED which failed to give him a good erection but it did improve his urination. Nocturia 0x.   His records from [place] are as follows: 10/31/2017: Prostate cancer: He is s/p treatment with EBRT and long term (2 year) androgen deprivation for high risk prostate cancer. He had his first Lupron in 09/2016. He was diagnosed with cT1c N0 M0, Gleason 4+5=9 adenocarcinoma (16/16 cores positive) of the prostate with a preoperative PSA of 8.1. last lupron was 45mg  6 months ago.  He had a CVA in 05/2017. He had a hx of lung cancer and underwent radiation therapy in 09/2017. He has mild hot flashes      PMH: Past Medical History:  Diagnosis Date   Anxiety    Aortic aneurysm (HCC)    4.4 will have intervention when 5.5   COPD (chronic obstructive pulmonary disease) ([place])    Diabetes mellitus without complication ([place])    Hypertension    Prostate cancer ([place])    Stroke [facility])     Surgical History: Past Surgical History:  Procedure Laterality Date   COLONOSCOPY N/A 11/23/2015   Procedure: COLONOSCOPY;  Surgeon: Aviva Signs, MD;  [facility];  Service: Gastroenterology;  Laterality: N/A;   FRACTURE SURGERY     PROSTATE BIOPSY     SINUS SURGERY WITH INSTATRAK      Home Medications:  Allergies as of 05/19/2020      Reactions   Sulfa Antibiotics Hives, Itching, Swelling   Tongue swelling   Sulfasalazine Hives, Itching, Swelling   Tongue swelling      Medication List       Accurate as of May 19, 2020  2:39 PM. If you have any questions, ask your nurse or doctor.        alprazolam 2 MG tablet Commonly  known as: XANAX Take 2 mg by mouth 2 (two) times daily.   CENTRUM SILVER 50+MEN PO Take by mouth every morning.   cyclobenzaprine 10 MG tablet Commonly known as: FLEXERIL Take 10 mg by mouth 3 (three) times daily as needed.   DULoxetine 30 MG capsule Commonly known as: CYMBALTA Take 30 mg by mouth daily.   glipiZIDE 10 MG tablet Commonly known as: GLUCOTROL Take 10 mg by mouth 2 (two) times daily before a meal.   magnesium 30 MG tablet Take 400 mg by mouth daily.   tamsulosin 0.4 MG Caps capsule Commonly known as: FLOMAX   VENTOLIN IN Inhale into the lungs every 8 (eight) hours as needed.   vitamin C 500 MG tablet Commonly known as: ASCORBIC ACID Take 500 mg by mouth daily.   vitamin E 1000 UNIT capsule Take 1,000 Units by mouth daily.   zinc gluconate 50 MG tablet Take 50 mg by mouth daily.       Allergies:  Allergies  Allergen Reactions   Sulfa Antibiotics Hives, Itching and Swelling    Tongue swelling   Sulfasalazine Hives, Itching and Swelling    Tongue swelling    Family History: Family History  Problem Relation Age of Onset   Heart disease Father  before age 12   Heart disease Mother    Cancer Neg Hx     Social History:  reports that he has been smoking cigarettes. He has a 88.00 pack-year smoking history. He has never used smokeless tobacco. He reports that he does not drink alcohol or use drugs.  ROS: All other review of systems were reviewed and are negative except what is noted above in HPI  Physical Exam: BP (!) 146/89    Pulse 93    Temp 97.7 F (36.5 C)    Ht 5\' 9"  (1.753 m)    Wt 166 lb (75.3 kg)    BMI 24.51 kg/m   Constitutional:  Alert and oriented, No acute distress. HEENT: [place] AT, moist mucus membranes.  Trachea midline, no masses. Cardiovascular: No clubbing, cyanosis, or edema. Respiratory: Normal respiratory effort, no increased work of breathing. GI: Abdomen is soft, nontender, nondistended, no abdominal  masses GU: No CVA tenderness.  Lymph: No cervical or inguinal lymphadenopathy. Skin: No rashes, bruises or suspicious lesions. Neurologic: Grossly intact, no focal deficits, moving all 4 extremities. Psychiatric: Normal mood and affect.  Laboratory Data: Lab Results  Component Value Date   WBC 10.5 05/15/2019   HGB 14.8 05/15/2019   HCT 46.4 05/15/2019   MCV 96.5 05/15/2019   PLT 263 05/15/2019    Lab Results  Component Value Date   CREATININE 1.20 12/31/2019    Lab Results  Component Value Date   PSA 8.7 (H) 05/11/2020    Lab Results  Component Value Date   TESTOSTERONE 166 (L) 05/11/2020    Lab Results  Component Value Date   HGBA1C 7.2 (H) 12/31/2019    Urinalysis No results found for: COLORURINE, APPEARANCEUR, LABSPEC, [place], GLUCOSEU, HGBUR, BILIRUBINUR, [place], PROTEINUR, UROBILINOGEN, NITRITE, LEUKOCYTESUR  No results found for: LABMICR, [place], RBCUA, LABEPIT, MUCUS, BACTERIA  Pertinent Imaging:  Results for orders placed during the hospital encounter of 05/14/08  DG Abd 1 View   Narrative Clinical Data: Left ureteral calculus.   ABDOMEN - 1 VIEW   Comparison: CT abdomen and pelvis 04/08/08.   Findings: No unexpected calcifications are seen over the renal shadows, expected course of the ureters or urinary bladder. Specifically, the small left UVJ stone seen the patient's CT scan is not visible on this study.  Bowel gas pattern is normal.  No focal bony abnormality.   IMPRESSION: Negative for plain film evidence of urinary tract calculi.  Provider: Jennye Boroughs, Brooke Dare   No results found for this or any previous visit. No results found for this or any previous visit. No results found for this or any previous visit. No results found for this or any previous visit. No results found for this or any previous visit. No results found for this or any previous visit. No results found for this or any previous visit.  Assessment &  Plan:    1. Prostate cancer ([place]) -PSa and BMP today. If it is elevated we will proceed with metastatic survery  2. Nocturia flomax 0.4mg  daily  3. Erectile dysfunction due to arterial insufficiency Patient defers therapy at this time   No follow-ups on file.  Nicolette Bang, MD  [facility] [place]

## 2020-05-21 ENCOUNTER — Telehealth: Payer: Self-pay | Admitting: Urology

## 2020-05-21 DIAGNOSIS — C61 Malignant neoplasm of prostate: Secondary | ICD-10-CM

## 2020-05-21 NOTE — Telephone Encounter (Signed)
Please see below.

## 2020-05-21 NOTE — Telephone Encounter (Signed)
Pt reports enlarged lymph node 1.5cm right iliac lymph node. Pt asking is this in relation to his PSA rising.

## 2020-05-21 NOTE — Telephone Encounter (Signed)
Pt requests nurse return call regarding a medication issue.

## 2020-05-21 NOTE — Telephone Encounter (Signed)
Pt requests nurse return his call.

## 2020-06-09 NOTE — Addendum Note (Signed)
Addended by: Nicolette Bang L on: 06/09/2020 02:00 PM   Modules accepted: Orders

## 2020-06-09 NOTE — Telephone Encounter (Signed)
Per Dr. Alyson Ingles, rise in PSA could be related to enlarged lymph node. Order for bone scan created. Pt called and made aware

## 2020-06-10 ENCOUNTER — Ambulatory Visit: Payer: Self-pay | Admitting: Urology

## 2020-06-16 ENCOUNTER — Other Ambulatory Visit: Payer: Self-pay

## 2020-06-16 ENCOUNTER — Encounter (HOSPITAL_COMMUNITY)
Admission: RE | Admit: 2020-06-16 | Discharge: 2020-06-16 | Disposition: A | Discharge status: Home / Self Care | Payer: Medicare Other | Source: Ambulatory Visit | Attending: Urology | Admitting: Urology

## 2020-06-16 DIAGNOSIS — C61 Malignant neoplasm of prostate: Secondary | ICD-10-CM | POA: Insufficient documentation

## 2020-06-16 MED ORDER — TECHNETIUM TC 99M MEDRONATE IV KIT
20.0000 | PACK | Freq: Once | INTRAVENOUS | Status: AC | PRN
  Administered 2020-06-16: 21.9 via INTRAVENOUS

## 2020-06-24 ENCOUNTER — Ambulatory Visit: Payer: Medicare Other | Admitting: Urology

## 2020-06-30 ENCOUNTER — Other Ambulatory Visit: Payer: Self-pay

## 2020-06-30 ENCOUNTER — Ambulatory Visit (INDEPENDENT_AMBULATORY_CARE_PROVIDER_SITE_OTHER): Payer: Medicare Other | Admitting: Urology

## 2020-06-30 ENCOUNTER — Encounter: Payer: Self-pay | Admitting: Urology

## 2020-06-30 VITALS — BP 101/63 | HR 106 | Temp 97.8°F | Ht 70.0 in | Wt 167.0 lb

## 2020-06-30 DIAGNOSIS — C775 Secondary and unspecified malignant neoplasm of intrapelvic lymph nodes: Secondary | ICD-10-CM | POA: Diagnosis not present

## 2020-06-30 DIAGNOSIS — R351 Nocturia: Secondary | ICD-10-CM | POA: Diagnosis not present

## 2020-06-30 DIAGNOSIS — C61 Malignant neoplasm of prostate: Secondary | ICD-10-CM

## 2020-06-30 LAB — BLADDER SCAN AMB NON-IMAGING: Scan Result: 41.8

## 2020-06-30 MED ORDER — TAMSULOSIN HCL 0.4 MG PO CAPS: 0.4000 mg | ORAL_CAPSULE | Freq: Two times a day (BID) | ORAL | 11 refills | Status: DC

## 2020-06-30 MED ORDER — DEGARELIX ACETATE(240 MG DOSE) 120 MG/VIAL ~~LOC~~ SOLR
240.0000 mg | Freq: Once | SUBCUTANEOUS | Status: AC
  Administered 2020-06-30: 240 mg via SUBCUTANEOUS

## 2020-06-30 MED ORDER — DOXYCYCLINE HYCLATE 100 MG PO CAPS
100.0000 mg | ORAL_CAPSULE | Freq: Two times a day (BID) | ORAL | 0 refills | Status: DC
Start: 2020-06-30 — End: 2020-12-01

## 2020-06-30 NOTE — Progress Notes (Signed)
Urological Symptom Review  Patient is experiencing the following symptoms: Frequent urination Get up at night to urinate Leakage of urine Stream starts and stops Trouble starting stream Injury to kidneys/bladder Weak stream Erection problems (male only)   Review of Systems  Gastrointestinal (upper)  : Negative for upper GI symptoms  Gastrointestinal (lower) : Negative for lower GI symptoms  Constitutional : Fatigue  Skin: Itching  Eyes: Blurred vision  Ear/Nose/Throat : Negative for Ear/Nose/Throat symptoms  Hematologic/Lymphatic: Swollen glands  Cardiovascular : Negative for cardiovascular symptoms  Respiratory : Negative for respiratory symptoms  Endocrine: Excessive thirst  Musculoskeletal: Back pain  Neurological: Dizziness  Psychologic: Depression Anxiety

## 2020-06-30 NOTE — Patient Instructions (Signed)
Prostate Cancer  The prostate is a male gland that helps make semen. Prostate cancer is when abnormal cells grow in this gland. Follow these instructions at home:  Take over-the-counter and prescription medicines only as told by your doctor.  Eat a healthy diet.  Get plenty of sleep.  Ask your doctor for help to find a support group for men with prostate cancer.  Keep all follow-up visits as told by your doctor. This is important.  If you have to go to the hospital, let your cancer doctor (oncologist) know.  Touch, hold, hug, and caress your partner to continue to show sexual feelings. Contact a doctor if:  You have trouble peeing (urinating).  You have blood in your pee (urine).  You have pain in your hips, back, or chest. Get help right away if:  You have weakness in your legs.  You lose feeling (have numbness) in your legs.  You cannot control your pee or your poop (stool).  You have trouble breathing.  You have sudden pain in your chest.  You have chills or a fever. Summary  The prostate is a male gland that helps make semen. Prostate cancer is when abnormal cells grow in this gland.  Ask your doctor for help to find a support group for men with prostate cancer.  Contact a doctor if you have problems peeing or have any new pain that you did not have before. This information is not intended to replace advice given to you by your health care provider. Make sure you discuss any questions you have with your health care provider. Document Revised: 11/16/2017 Document Reviewed: 08/14/2016 Elsevier Patient Education  2020 Elsevier Inc.  

## 2020-06-30 NOTE — Progress Notes (Signed)
06/30/2020 2:02 PM   [name] 1951-04-19 [id number]  Referring provider: [name], PA-C [address] [place],  [place] [address]  Prostate cancer  HPI: [name] is a [phone] here for followup for prostate cancer. His PSA has been increasing from 0.9 to 8.7 to 9.7. testosterone 1.66. CT abd pelvis showed a new 1.5cm enlarged iliac lymph node. Bone scan negative. He has mild to moderate LUTS on flomax 0.4mg  daily. He has nocturia 1-2x, hesitancy, intermittent weak stream.    PMH: Past Medical History:  Diagnosis Date   Anxiety    Aortic aneurysm (HCC)    4.4 will have intervention when 5.5   COPD (chronic obstructive pulmonary disease) (HCC)    Diabetes mellitus without complication ([place])    Hypertension    Prostate cancer ([place])    Stroke [facility])     Surgical History: Past Surgical History:  Procedure Laterality Date   COLONOSCOPY N/A 11/23/2015   Procedure: COLONOSCOPY;  Surgeon: Aviva Signs, MD;  [facility];  Service: Gastroenterology;  Laterality: N/A;   FRACTURE SURGERY     PROSTATE BIOPSY     SINUS SURGERY WITH INSTATRAK      Home Medications:  Allergies as of 06/30/2020      Reactions   Sulfa Antibiotics Hives, Itching, Swelling   Tongue swelling   Sulfasalazine Hives, Itching, Swelling   Tongue swelling      Medication List       Accurate as of June 30, 2020  2:02 PM. If you have any questions, ask your nurse or doctor.        alprazolam 2 MG tablet Commonly known as: XANAX Take 2 mg by mouth 2 (two) times daily.   CENTRUM SILVER 50+MEN PO Take by mouth every morning.   cyclobenzaprine 10 MG tablet Commonly known as: FLEXERIL Take 10 mg by mouth 3 (three) times daily as needed.   DULoxetine 30 MG capsule Commonly known as: CYMBALTA Take 30 mg by mouth daily.   glipiZIDE 10 MG tablet Commonly known as: GLUCOTROL Take 10 mg by mouth 2 (two) times daily before a meal.   magnesium 30 MG  tablet Take 400 mg by mouth daily.   milk thistle 175 MG tablet Take 175 mg by mouth daily.   tamsulosin 0.4 MG Caps capsule Commonly known as: FLOMAX Take 1 capsule (0.4 mg total) by mouth daily after supper.   VENTOLIN IN Inhale into the lungs every 8 (eight) hours as needed.   vitamin C 500 MG tablet Commonly known as: ASCORBIC ACID Take 500 mg by mouth daily.   vitamin E 1000 UNIT capsule Take 1,000 Units by mouth daily.   zinc gluconate 50 MG tablet Take 50 mg by mouth daily.       Allergies:  Allergies  Allergen Reactions   Sulfa Antibiotics Hives, Itching and Swelling    Tongue swelling   Sulfasalazine Hives, Itching and Swelling    Tongue swelling    Family History: Family History  Problem Relation Age of Onset   Heart disease Father        before age 66   Heart disease Mother    Cancer Neg Hx     Social History:  reports that he has been smoking cigarettes. He has a 69.00 pack-year smoking history. He has never used smokeless tobacco. He reports that he does not drink alcohol and does not use drugs.  ROS: All other review of systems were reviewed and are  negative except what is noted above in HPI  Physical Exam: BP 101/63    Pulse (!) 106    Temp 97.8 F (36.6 C)    Ht 5\' 10"  (1.778 m)    Wt 167 lb (75.8 kg)    BMI 23.96 kg/m   Constitutional:  Alert and oriented, No acute distress. HEENT: [place] AT, moist mucus membranes.  Trachea midline, no masses. Cardiovascular: No clubbing, cyanosis, or edema. Respiratory: Normal respiratory effort, no increased work of breathing. GI: Abdomen is soft, nontender, nondistended, no abdominal masses GU: No CVA tenderness.  Lymph: No cervical or inguinal lymphadenopathy. Skin: No rashes, bruises or suspicious lesions. Neurologic: Grossly intact, no focal deficits, moving all 4 extremities. Psychiatric: Normal mood and affect.  Laboratory Data: Lab Results  Component Value Date   WBC 10.5 05/15/2019   HGB  14.8 05/15/2019   HCT 46.4 05/15/2019   MCV 96.5 05/15/2019   PLT 263 05/15/2019    Lab Results  Component Value Date   CREATININE 1.13 05/19/2020    Lab Results  Component Value Date   PSA 9.7 (H) 05/19/2020   PSA 8.7 (H) 05/11/2020    Lab Results  Component Value Date   TESTOSTERONE 166 (L) 05/11/2020    Lab Results  Component Value Date   HGBA1C 7.2 (H) 12/31/2019    Urinalysis No results found for: COLORURINE, APPEARANCEUR, LABSPEC, [place], GLUCOSEU, HGBUR, BILIRUBINUR, KETONESUR, PROTEINUR, UROBILINOGEN, NITRITE, LEUKOCYTESUR  No results found for: LABMICR, [place], RBCUA, LABEPIT, MUCUS, BACTERIA  Pertinent Imaging: Ct abd/pelvis anmd bone scan: Images reviewed and discussed with the patient Results for orders placed during the hospital encounter of 05/14/08  DG Abd 1 View  Narrative Clinical Data: Left ureteral calculus.  ABDOMEN - 1 VIEW  Comparison: CT abdomen and pelvis 04/08/08.  Findings: No unexpected calcifications are seen over the renal shadows, expected course of the ureters or urinary bladder. Specifically, the small left UVJ stone seen the patient's CT scan is not visible on this study.  Bowel gas pattern is normal.  No focal bony abnormality.  IMPRESSION: Negative for plain film evidence of urinary tract calculi.  Provider: Jennye Boroughs, Brooke Dare  No results found for this or any previous visit.  No results found for this or any previous visit.  No results found for this or any previous visit.  No results found for this or any previous visit.  No results found for this or any previous visit.  No results found for this or any previous visit.  No results found for this or any previous visit.   Assessment & Plan:    1. Nocturia -continue flomax 0.4mg  BID - BLADDER SCAN AMB NON-IMAGING  2. Metastatic prostate cancer -We discussed the management including surveillance and ADT. We will start firmagon 240mg  today and  RTC 1 month for lupron 45mg   No follow-ups on file.  Nicolette Bang, MD  [facility] [place]

## 2020-07-06 ENCOUNTER — Encounter: Payer: Self-pay | Admitting: Nutrition

## 2020-07-06 ENCOUNTER — Encounter: Payer: Self-pay | Admitting: "Endocrinology

## 2020-07-06 ENCOUNTER — Ambulatory Visit (INDEPENDENT_AMBULATORY_CARE_PROVIDER_SITE_OTHER): Payer: Medicare Other | Admitting: "Endocrinology

## 2020-07-06 ENCOUNTER — Encounter: Payer: Medicare Other | Attending: "Endocrinology | Admitting: Nutrition

## 2020-07-06 ENCOUNTER — Other Ambulatory Visit: Payer: Self-pay

## 2020-07-06 VITALS — BP 146/90 | HR 87 | Ht 70.0 in | Wt 165.0 lb

## 2020-07-06 DIAGNOSIS — E782 Mixed hyperlipidemia: Secondary | ICD-10-CM | POA: Diagnosis not present

## 2020-07-06 DIAGNOSIS — E1159 Type 2 diabetes mellitus with other circulatory complications: Secondary | ICD-10-CM

## 2020-07-06 DIAGNOSIS — F17200 Nicotine dependence, unspecified, uncomplicated: Secondary | ICD-10-CM | POA: Diagnosis not present

## 2020-07-06 DIAGNOSIS — C3492 Malignant neoplasm of unspecified part of left bronchus or lung: Secondary | ICD-10-CM | POA: Diagnosis not present

## 2020-07-06 DIAGNOSIS — E1165 Type 2 diabetes mellitus with hyperglycemia: Secondary | ICD-10-CM | POA: Insufficient documentation

## 2020-07-06 DIAGNOSIS — E118 Type 2 diabetes mellitus with unspecified complications: Secondary | ICD-10-CM | POA: Insufficient documentation

## 2020-07-06 DIAGNOSIS — N183 Chronic kidney disease, stage 3 unspecified: Secondary | ICD-10-CM

## 2020-07-06 DIAGNOSIS — IMO0002 Reserved for concepts with insufficient information to code with codable children: Secondary | ICD-10-CM

## 2020-07-06 LAB — POCT GLYCOSYLATED HEMOGLOBIN (HGB A1C): Hemoglobin A1C: 6.9 % — AB (ref 4.0–5.6)

## 2020-07-06 MED ORDER — GLIPIZIDE 5 MG PO TABS: 5.0000 mg | ORAL_TABLET | Freq: Two times a day (BID) | ORAL | 1 refills | Status: DC

## 2020-07-06 NOTE — Progress Notes (Signed)
07/06/2020, 12:03 PM          Endocrinology follow-up note   Subjective:    Patient ID: Stephen Che., male    DOB: 18-Mar-1951.  Stephen Che. is being seen in follow up in the management of his currently  uncontrolled symptomatic type 2 diabetes requested by  Scherrie Bateman   Past Medical History:  Diagnosis Date  . Anxiety   . Aortic aneurysm (HCC)    4.4 will have intervention when 5.5  . COPD (chronic obstructive pulmonary disease) ([place])   . Diabetes mellitus without complication ([place])   . Hypertension   . Prostate cancer ([place])   . Stroke [facility])    Past Surgical History:  Procedure Laterality Date  . COLONOSCOPY N/A 11/23/2015   Procedure: COLONOSCOPY;  Surgeon: Aviva Signs, MD;  Location: [facility];  Service: Gastroenterology;  Laterality: N/A;  . FRACTURE SURGERY    . PROSTATE BIOPSY    . SINUS SURGERY WITH INSTATRAK     Social History   Socioeconomic History  . Marital status: Married    Spouse name: Not on file  . Number of children: Not on file  . Years of education: Not on file  . Highest education level: Not on file  Occupational History  . Not on file  Tobacco Use  . Smoking status: Current Every Day Smoker    Packs/day: 2.00    Years: 44.00    Pack years: 88.00    Types: Cigarettes  . Smokeless tobacco: Never Used  Vaping Use  . Vaping Use: Never used  Substance and Sexual Activity  . Alcohol use: No    Alcohol/week: 0.0 standard drinks  . Drug use: No  . Sexual activity: Never  Other Topics Concern  . Not on file  Social History Narrative  . Not on file   Social Determinants of Health   Financial Resource Strain:   . Difficulty of Paying Living Expenses:   Food Insecurity:   . Worried About Charity fundraiser in the Last Year:   . Arboriculturist in the Last Year:   Transportation Needs:   . Film/video editor (Medical):   Marland Kitchen Lack of Transportation (Non-Medical):   Physical  Activity:   . Days of Exercise per Week:   . Minutes of Exercise per Session:   Stress:   . Feeling of Stress :   Social Connections:   . Frequency of Communication with Friends and Family:   . Frequency of Social Gatherings with Friends and Family:   . Attends Religious Services:   . Active Member of Clubs or Organizations:   . Attends Archivist Meetings:   Marland Kitchen Marital Status:    Outpatient Encounter Medications as of 07/06/2020  Medication Sig  . Albuterol (VENTOLIN IN) Inhale into the lungs every 8 (eight) hours as needed.   Marland Kitchen alprazolam (XANAX) 2 MG tablet Take 2 mg by mouth 2 (two) times daily.  . cyclobenzaprine (FLEXERIL) 10 MG tablet Take 10 mg by mouth 3 (three) times daily as needed. (Patient not taking: Reported on 06/30/2020)  . doxycycline (VIBRAMYCIN) 100 MG capsule Take 1 capsule (100 mg total) by mouth every 12 (twelve) hours.  . DULoxetine (CYMBALTA) 30 MG capsule Take 30 mg by mouth daily.  Marland Kitchen glipiZIDE (GLUCOTROL) 5 MG tablet Take 1 tablet (5 mg total) by mouth 2 (two) times daily before a meal.  .  magnesium 30 MG tablet Take 400 mg by mouth daily.  . milk thistle 175 MG tablet Take 175 mg by mouth daily.  . tamsulosin (FLOMAX) 0.4 MG CAPS capsule Take 1 capsule (0.4 mg total) by mouth in the morning and at bedtime.  . vitamin C (ASCORBIC ACID) 500 MG tablet Take 500 mg by mouth daily.  . vitamin E 1000 UNIT capsule Take 1,000 Units by mouth daily.  Marland Kitchen zinc gluconate 50 MG tablet Take 50 mg by mouth daily.  . [DISCONTINUED] glipiZIDE (GLUCOTROL) 10 MG tablet Take 10 mg by mouth 2 (two) times daily before a meal.  . [DISCONTINUED] Multiple Vitamins-Minerals (CENTRUM SILVER 50+MEN PO) Take by mouth every morning.   No facility-administered encounter medications on file as of 07/06/2020.    ALLERGIES: Allergies  Allergen Reactions  . Sulfa Antibiotics Hives, Itching and Swelling    Tongue swelling  . Sulfasalazine Hives, Itching and Swelling    Tongue  swelling    VACCINATION STATUS:  There is no immunization history on file for this patient.  Diabetes He presents for his follow-up diabetic visit. He has type 2 diabetes mellitus. Onset time: He was diagnosed at approximate age of 76 years. His disease course has been improving. There are no hypoglycemic associated symptoms. Pertinent negatives for hypoglycemia include no confusion, headaches, pallor or seizures. Associated symptoms include blurred vision. Pertinent negatives for diabetes include no chest pain, no fatigue, no polydipsia, no polyphagia, no polyuria and no weakness. There are no hypoglycemic complications. Symptoms are improving. Diabetic complications include a CVA. Risk factors for coronary artery disease include diabetes mellitus, hypertension, male sex and tobacco exposure. Current diabetic treatment includes oral agent (monotherapy) (Is currently taking glipizide 10 mg p.o. twice daily.). His weight is fluctuating minimally. He is following a generally unhealthy diet. When asked about meal planning, he reported none. He has not had a previous visit with a dietitian. He never participates in exercise. His home blood glucose trend is decreasing steadily. His breakfast blood glucose range is generally 110-130 mg/dl. His bedtime blood glucose range is generally 130-140 mg/dl. His overall blood glucose range is 130-140 mg/dl. (He reports his glycemic profile are near target, no hypoglycemia.  His previsit labs show A1c of 7.2%.) An ACE inhibitor/angiotensin II receptor blocker is not being taken. Eye exam is current.   Review of systems: Limited as above.    Objective:    BP (!) 146/90   Pulse 87   Ht 5\' 10"  (1.778 m)   Wt 165 lb (74.8 kg)   BMI 23.68 kg/m   Wt Readings from Last 3 Encounters:  07/06/20 165 lb (74.8 kg)  06/30/20 167 lb (75.8 kg)  05/19/20 166 lb (75.3 kg)       Recent Results (from the past 2160 hour(s))  Testosterone     Status: Abnormal    Collection Time: 05/11/20 10:41 AM  Result Value Ref Range   Testosterone 166 (L) 250 - 827 ng/dL    Comment: In hypogonadal males, Testosterone, Total, LC/MS/MS, is the recommended assay due to the diminished accuracy of immunoassay at levels below 250 ng/dL. This test code [phone]) must be collected in a red-top tube with no gel.    PSA     Status: Abnormal   Collection Time: 05/11/20 10:41 AM  Result Value Ref Range   PSA 8.7 (H) < OR = 4.0 ng/mL    Comment: The total PSA value from this assay system is  standardized against the WHO standard.  The test  result will be approximately 20% lower when compared  to the equimolar-standardized total PSA (Beckman  Coulter). Comparison of serial PSA results should be  interpreted with this fact in mind. . This test was performed using the Siemens  chemiluminescent method. Values obtained from  different assay methods cannot be used interchangeably. PSA levels, regardless of value, should not be interpreted as absolute evidence of the presence or absence of disease.   Basic metabolic panel     Status: None   Collection Time: 05/19/20  3:15 PM  Result Value Ref Range   Glucose, Bld 88 65 - 139 mg/dL    Comment: .        Non-fasting reference interval .    BUN 14 7 - 25 mg/dL   Creat 1.13 0.70 - 1.25 mg/dL    Comment: For patients >57 years of age, the reference limit for Creatinine is approximately 13% higher for people identified as African-American. .    BUN/Creatinine Ratio NOT APPLICABLE 6 - 22 (calc)   Sodium 140 135 - 146 mmol/L   Potassium 4.7 3.5 - 5.3 mmol/L   Chloride 104 98 - 110 mmol/L   CO2 28 20 - 32 mmol/L   Calcium 9.8 8.6 - 10.3 mg/dL  PSA     Status: Abnormal   Collection Time: 05/19/20  3:15 PM  Result Value Ref Range   PSA 9.7 (H) < OR = 4.0 ng/mL    Comment: The total PSA value from this assay system is  standardized against the WHO standard. The test  result will be approximately 20% lower when  compared  to the equimolar-standardized total PSA (Beckman  Coulter). Comparison of serial PSA results should be  interpreted with this fact in mind. . This test was performed using the Siemens  chemiluminescent method. Values obtained from  different assay methods cannot be used interchangeably. PSA levels, regardless of value, should not be interpreted as absolute evidence of the presence or absence of disease.   BLADDER SCAN AMB NON-IMAGING     Status: None   Collection Time: 06/30/20  1:51 PM  Result Value Ref Range   Scan Result 41.8   HgB A1c     Status: Abnormal   Collection Time: 07/06/20  9:54 AM  Result Value Ref Range   Hemoglobin A1C 6.9 (A) 4.0 - 5.6 %   HbA1c POC (<> result, manual entry)     HbA1c, POC (prediabetic range)     HbA1c, POC (controlled diabetic range)        Assessment & Plan:   1. DM type 2 causing vascular disease ([place])  - Stephen Che. has currently uncontrolled symptomatic type 2 DM since 69 years of age. -He continued to have near target glycemic profile.  He had no major hypoglycemia.  His previsit labs show A1c of 6.9%, improving from 11.7% overall.    Recent labs are reviewed with him.   -his diabetes is complicated by CVA , chronic heavy smoking, and he remains at extremely high risk for more acute and chronic complications which include CAD, CVA, CKD, retinopathy, and neuropathy. These are all discussed in detail with him.  - I have counseled him on diet management by adopting a carbohydrate restricted/protein rich diet.  - he  admits there is a room for improvement in his diet and drink choices. -  Suggestion is made for him to avoid simple carbohydrates  from his diet including Cakes, Sweet Desserts / Pastries, Ice  Cream, Soda (diet and regular), Sweet Tea, Candies, Chips, Cookies, Sweet Pastries,  Store Bought Juices, Alcohol in Excess of  1-2 drinks a day, Artificial Sweeteners, Coffee Creamer, and "Sugar-free" Products. This  will help patient to have stable blood glucose profile and potentially avoid unintended weight gain.   - I encouraged him to switch to  unprocessed or minimally processed complex starch and increased protein intake (animal or plant source), fruits, and vegetables.  - he is advised to stick to a routine mealtimes to eat 3 meals  a day and avoid unnecessary snacks ( to snack only to correct hypoglycemia).    - I have approached him with the following individualized plan to manage diabetes and patient agrees:   -Based on his presentation with near target glycemic profile and his clear reluctance to go on insulin, he will be kept on non-insulin treatment for now.   -He is responding and benefiting from glipizide treatment.  He is at risk for hypoglycemia, will lower his  glipizide to 5 mg p.o. twice daily with breakfast and supper, continue monitoring blood glucose at least 2 times daily-before breakfast and before supper and at any other time as needed. - he is encouraged to call clinic for blood glucose levels less than 70 or above 300 mg /dl.  -He is not a suitable candidate for metformin, SGLT2 inhibitors, nor incretin therapy. -He will be reapproached for insulin treatment if he loses control by next visit.  - Patient specific target  A1c;  LDL, HDL, Triglycerides  were discussed in detail.  2) BP/HTN:  His blood pressure is not controlled to target.   He is not on any antihypertensive medications, reported sulfa allergy.   3) Lipids/HPL: No recent lipid panel to review.  He is not on any statins.    4)  Weight/Diet: His BMI is 23.6-weight loss is not advisable for him.  CDE Consult will be initiated . Exercise, and detailed carbohydrates information provided  -  detailed on discharge instructions.  5) Chronic Care/Health Maintenance:  -he  Is not on ACEI/ARB and Statin medications and  is encouraged to initiate and continue to follow up with Ophthalmology, Dentist,  Podiatrist at  least yearly or according to recommendations, and advised to  Quit smoking (this is his #1health risk). I have recommended yearly flu vaccine and pneumonia vaccine at least every 5 years; moderate intensity exercise for up to 150 minutes weekly; and  sleep for at least 7 hours a day.  The patient was counseled on the dangers of tobacco use, and was advised to quit.  Reviewed strategies to maximize success, including removing cigarettes and smoking materials from environment.    - I advised patient to maintain close follow up with [name], PA-C for primary care needs.   - Time spent on this patient care encounter:  35 min, of which > 50% was spent in  counseling and the rest reviewing his blood glucose logs , discussing his hypoglycemia and hyperglycemia episodes, reviewing his current and  previous labs / studies  ( including abstraction from other facilities) and medications  doses and developing a  long term treatment plan and documenting his care.   Please refer to Patient Instructions for Blood Glucose Monitoring and Insulin/Medications Dosing Guide"  in media tab for additional information. Please  also refer to " Patient Self Inventory" in the Media  tab for reviewed elements of pertinent patient history.  Stephen Che. participated in the discussions, expressed  understanding, and voiced agreement with the above plans.  All questions were answered to his satisfaction. he is encouraged to contact clinic should he have any questions or concerns prior to his return visit.   Follow up plan: - Return in about 3 months (around 10/06/2020) for Bring Meter and Logs- A1c in Office.  Glade Lloyd, MD [facility] [address] [place], [place] [address] Phone: [phone]  Fax: [phone]    07/06/2020, 12:03 PM  This note was partially dictated with voice recognition software. Similar sounding words can be transcribed  inadequately or may not  be corrected upon review.

## 2020-07-06 NOTE — Patient Instructions (Addendum)
Goals  Increase fresh fruits and vegetables Cut out eating before bed. Reduce smoking 2 cigarretts a day Walk 15 minutes a day. Don't skip meals. Reduce Glipizide 5 mg BID.

## 2020-07-06 NOTE — Progress Notes (Signed)
  Medical Nutrition Therapy:  Appt start time: 1000 end time: 1015 Assessment:  Primary concerns today: Diabetes Type 2. PMH: Hyperlipidemia, HTN.  A1C 6.8%. Improved. Saw Dr. Dorris Fetch today. Reduced GLipizide to 5 mg BID. Tends to still have issues with his sleeping. Skips meals occasionally. Still smoking and eating out fast foods. Gets hungry at night before bed and eats PB crackers causing am BS to be elevated. Plans on going back on Niacin. Stopped taking his flexiril to help with not sleeping so much. On depression medications. Still smoking heavily and not interested in reducing or stopping.   Lab Results  Component Value Date   HGBA1C 6.9 (A) 07/06/2020    CMP Latest Ref Rng & Units 05/19/2020 12/31/2019 06/25/2019  Glucose 65 - 139 mg/dL 88 142(H) 143(H)  BUN 7 - 25 mg/dL 14 17 18   Creatinine 0.70 - 1.25 mg/dL 1.13 1.20 1.18  Sodium 135 - 146 mmol/L 140 140 140  Potassium 3.5 - 5.3 mmol/L 4.7 4.8 5.3  Chloride 98 - 110 mmol/L 104 103 105  CO2 20 - 32 mmol/L 28 31 27   Calcium 8.6 - 10.3 mg/dL 9.8 9.3 9.8  Total Protein 6.1 - 8.1 g/dL - 7.0 7.2  Total Bilirubin 0.2 - 1.2 mg/dL - 0.3 0.3  AST 10 - 35 U/L - 20 17  ALT 9 - 46 U/L - 28 23   Lipid Panel     Component Value Date/Time   CHOL 275 (A) 07/09/2018 0000   CHOL 225 (A) 07/09/2018 0000   TRIG 244 (A) 07/09/2018 0000   TRIG 187 (A) 07/09/2018 0000   HDL 42 07/09/2018 0000   HDL 36 07/09/2018 0000   LDLCALC 184 07/09/2018 0000   LDLCALC 152 07/09/2018 0000     Preferred Learning Style:   No preference indicated   Learning Readiness:  Change in progress  MEDICATIONS:   DIETARY INTAKE: 24-hr recall:  B ( AM):  Shredded wheat and eggs 3  Snk ( AM):  L ( PM): slept through lunch Snk ( PM): D ( PM):Baconator at [employer],  Snk ( PM):  Beverages: water  Usual physical activity: ADL  Estimated energy needs: 1800-200- calories 200  g carbohydrates 135 g protein 50 g fat  Progress Towards Goal(s):  In  progress.   Nutritional Diagnosis:  NB-1.1 Food and nutrition-related knowledge deficit As related to Diabetes and Hyperlipidemia.  As evidenced by A1C 7.3% and elevated lipd levels..    Intervention:  Nutrition and Diabetes education provided on My Plate, CHO counting, meal planning, portion sizes, timing of meals, avoiding snacks between meals unless having a low blood sugar, target ranges for A1C and blood sugars, signs/symptoms and treatment of hyper/hypoglycemia, monitoring blood sugars, taking medications as prescribed, benefits of exercising 30 minutes per day and prevention of complications of DM.  Goals  Increase fresh fruits and vegetables Cut out eating before bed. Reduce smoking 2 cigarretts a day Walk 15 minutes a day. Don't skip meals. Reduce Glipizide 5 mg BID.   Teaching Method Utilized:  Visual Auditory Hands on  Handouts given during visit include:  The Plate Method   Meal Plan Card  Barriers to learning/adherence to lifestyle change: none  Demonstrated degree of understanding via:  Teach Back   Monitoring/Evaluation:  Dietary intake, exercise, meal planning , and body weight 3-4 months.Marland Kitchen

## 2020-07-19 ENCOUNTER — Telehealth: Payer: Self-pay | Admitting: "Endocrinology

## 2020-07-19 NOTE — Telephone Encounter (Signed)
Discussed with pt, understanding voiced. 

## 2020-07-19 NOTE — Telephone Encounter (Signed)
Pt states his BG has been more elevated than he has had in the past. States his glipizide was decreased to 5mg  daily at his last visit and he would like to increase it.  BG readings: Friday 163 before breakfast, 131 before dinner, 85 before bed. Saturday:160 before breakfast, 68 before bed at 2:00a.m. Sunday: 156 before breakfast, 200 before bed at 1:00a.m. This morning: 133 before breakfast.  Pt is not on any insulin and states his urologist gave him 2 injections recently.

## 2020-07-19 NOTE — Telephone Encounter (Signed)
Patient left a VM for a nurse to return his call regarding issues with his sugar and insulin.

## 2020-07-19 NOTE — Telephone Encounter (Signed)
He dose not need insulin or higher dose of Glipizide for now. He can monitor BG x 2 daily, report if fasting readings are  <70 or >200 x 3 days in a week.

## 2020-08-09 ENCOUNTER — Ambulatory Visit: Payer: Medicare Other | Admitting: Urology

## 2020-08-26 DIAGNOSIS — E1165 Type 2 diabetes mellitus with hyperglycemia: Secondary | ICD-10-CM | POA: Diagnosis not present

## 2020-08-26 DIAGNOSIS — I10 Essential (primary) hypertension: Secondary | ICD-10-CM | POA: Diagnosis not present

## 2020-08-26 DIAGNOSIS — F22 Delusional disorders: Secondary | ICD-10-CM | POA: Diagnosis not present

## 2020-08-26 DIAGNOSIS — E7849 Other hyperlipidemia: Secondary | ICD-10-CM | POA: Diagnosis not present

## 2020-08-26 DIAGNOSIS — R972 Elevated prostate specific antigen [PSA]: Secondary | ICD-10-CM | POA: Diagnosis not present

## 2020-08-26 DIAGNOSIS — Z1389 Encounter for screening for other disorder: Secondary | ICD-10-CM | POA: Diagnosis not present

## 2020-08-26 DIAGNOSIS — Z6823 Body mass index (BMI) 23.0-23.9, adult: Secondary | ICD-10-CM | POA: Diagnosis not present

## 2020-08-26 DIAGNOSIS — Z0001 Encounter for general adult medical examination with abnormal findings: Secondary | ICD-10-CM | POA: Diagnosis not present

## 2020-08-26 DIAGNOSIS — Z719 Counseling, unspecified: Secondary | ICD-10-CM | POA: Diagnosis not present

## 2020-08-26 DIAGNOSIS — F419 Anxiety disorder, unspecified: Secondary | ICD-10-CM | POA: Diagnosis not present

## 2020-08-26 DIAGNOSIS — J984 Other disorders of lung: Secondary | ICD-10-CM | POA: Diagnosis not present

## 2020-08-26 DIAGNOSIS — F17200 Nicotine dependence, unspecified, uncomplicated: Secondary | ICD-10-CM | POA: Diagnosis not present

## 2020-08-26 DIAGNOSIS — J439 Emphysema, unspecified: Secondary | ICD-10-CM | POA: Diagnosis not present

## 2020-09-01 ENCOUNTER — Encounter: Payer: Self-pay | Admitting: Cardiology

## 2020-09-01 ENCOUNTER — Other Ambulatory Visit: Payer: Self-pay

## 2020-09-01 ENCOUNTER — Ambulatory Visit (INDEPENDENT_AMBULATORY_CARE_PROVIDER_SITE_OTHER): Payer: Medicare Other | Admitting: Urology

## 2020-09-01 ENCOUNTER — Encounter: Payer: Self-pay | Admitting: Urology

## 2020-09-01 ENCOUNTER — Encounter: Payer: Self-pay | Admitting: *Deleted

## 2020-09-01 VITALS — BP 124/78 | HR 94 | Temp 97.3°F | Ht 70.0 in | Wt 155.0 lb

## 2020-09-01 DIAGNOSIS — C775 Secondary and unspecified malignant neoplasm of intrapelvic lymph nodes: Secondary | ICD-10-CM

## 2020-09-01 DIAGNOSIS — C61 Malignant neoplasm of prostate: Secondary | ICD-10-CM

## 2020-09-01 LAB — URINALYSIS, ROUTINE W REFLEX MICROSCOPIC
Bilirubin, UA: NEGATIVE
Glucose, UA: NEGATIVE
Ketones, UA: NEGATIVE
Leukocytes,UA: NEGATIVE
Nitrite, UA: NEGATIVE
Protein,UA: NEGATIVE
RBC, UA: NEGATIVE
Specific Gravity, UA: 1.01 (ref 1.005–1.030)
Urobilinogen, Ur: 0.2 mg/dL (ref 0.2–1.0)
pH, UA: 5.5 (ref 5.0–7.5)

## 2020-09-01 MED ORDER — LEUPROLIDE ACETATE (6 MONTH) 45 MG IM KIT
45.0000 mg | PACK | Freq: Once | INTRAMUSCULAR | Status: AC
  Administered 2020-09-01: 45 mg via INTRAMUSCULAR

## 2020-09-01 NOTE — Progress Notes (Signed)
09/01/2020 2:37 PM   [name] June 20, 1951 [id number]  Referring provider: [name], PA-C [address] [place],  [place] [address]  followup prostate   HPI: Stephen Palmer is a 69VQ here for followup for metastatic prostate cancer. NO recent PSA. He got firmagon 240mg  on 06/30/2020. No hot flashes. No significant LUTS. He is due for eligard 45mg .    PMH: Past Medical History:  Diagnosis Date  . Anxiety   . Aortic aneurysm (HCC)    4.4 will have intervention when 5.5  . COPD (chronic obstructive pulmonary disease) ([place])   . Diabetes mellitus without complication ([place])   . Hypertension   . Prostate cancer ([place])   . Stroke [facility])     Surgical History: Past Surgical History:  Procedure Laterality Date  . COLONOSCOPY N/A 11/23/2015   Procedure: COLONOSCOPY;  Surgeon: Aviva Signs, MD;  Location: [facility];  Service: Gastroenterology;  Laterality: N/A;  . FRACTURE SURGERY    . PROSTATE BIOPSY    . SINUS SURGERY WITH INSTATRAK      Home Medications:  Allergies as of 09/01/2020      Reactions   Sulfa Antibiotics Hives, Itching, Swelling   Tongue swelling   Sulfasalazine Hives, Itching, Swelling   Tongue swelling      Medication List       Accurate as of September 01, 2020  2:37 PM. If you have any questions, ask your nurse or doctor.        alprazolam 2 MG tablet Commonly known as: XANAX Take 2 mg by mouth 2 (two) times daily.   cyclobenzaprine 10 MG tablet Commonly known as: FLEXERIL Take 10 mg by mouth 3 (three) times daily as needed.   doxycycline 100 MG capsule Commonly known as: VIBRAMYCIN Take 1 capsule (100 mg total) by mouth every 12 (twelve) hours.   DULoxetine 30 MG capsule Commonly known as: CYMBALTA Take 30 mg by mouth daily.   glipiZIDE 5 MG tablet Commonly known as: GLUCOTROL Take 1 tablet (5 mg total) by mouth 2 (two) times daily before a meal.   magnesium 30 MG tablet Take 400 mg by mouth daily.   milk thistle  175 MG tablet Take 175 mg by mouth daily.   tamsulosin 0.4 MG Caps capsule Commonly known as: FLOMAX Take 1 capsule (0.4 mg total) by mouth in the morning and at bedtime.   VENTOLIN IN Inhale into the lungs every 8 (eight) hours as needed.   vitamin C 500 MG tablet Commonly known as: ASCORBIC ACID Take 500 mg by mouth daily.   vitamin E 1000 UNIT capsule Take 1,000 Units by mouth daily.   zinc gluconate 50 MG tablet Take 50 mg by mouth daily.       Allergies:  Allergies  Allergen Reactions  . Sulfa Antibiotics Hives, Itching and Swelling    Tongue swelling  . Sulfasalazine Hives, Itching and Swelling    Tongue swelling    Family History: Family History  Problem Relation Age of Onset  . Heart disease Father        before age 53  . Heart disease Mother   . Cancer Neg Hx     Social History:  reports that he has been smoking cigarettes. He has a 88.00 pack-year smoking history. He has never used smokeless tobacco. He reports that he does not drink alcohol and does not use drugs.  ROS: All other review of systems were reviewed and are negative except what is noted  above in HPI  Physical Exam: BP 124/78   Pulse 94   Temp (!) 97.3 F (36.3 C)   Ht 5\' 10"  (1.778 m)   Wt 155 lb (70.3 kg)   BMI 22.24 kg/m   Constitutional:  Alert and oriented, No acute distress. HEENT: [place] AT, moist mucus membranes.  Trachea midline, no masses. Cardiovascular: No clubbing, cyanosis, or edema. Respiratory: Normal respiratory effort, no increased work of breathing. GI: Abdomen is soft, nontender, nondistended, no abdominal masses GU: No CVA tenderness.  Lymph: No cervical or inguinal lymphadenopathy. Skin: No rashes, bruises or suspicious lesions. Neurologic: Grossly intact, no focal deficits, moving all 4 extremities. Psychiatric: Normal mood and affect.  Laboratory Data: Lab Results  Component Value Date   WBC 10.5 05/15/2019   HGB 14.8 05/15/2019   HCT 46.4 05/15/2019    MCV 96.5 05/15/2019   PLT 263 05/15/2019    Lab Results  Component Value Date   CREATININE 1.13 05/19/2020    Lab Results  Component Value Date   PSA 9.7 (H) 05/19/2020   PSA 8.7 (H) 05/11/2020    Lab Results  Component Value Date   TESTOSTERONE 166 (L) 05/11/2020    Lab Results  Component Value Date   HGBA1C 6.9 (A) 07/06/2020    Urinalysis No results found for: COLORURINE, APPEARANCEUR, LABSPEC, [place], GLUCOSEU, HGBUR, BILIRUBINUR, KETONESUR, PROTEINUR, UROBILINOGEN, NITRITE, LEUKOCYTESUR  No results found for: LABMICR, [place], RBCUA, LABEPIT, MUCUS, BACTERIA  Pertinent Imaging:  Results for orders placed during the hospital encounter of 05/14/08  DG Abd 1 View  Narrative Clinical Data: Left ureteral calculus.  ABDOMEN - 1 VIEW  Comparison: CT abdomen and pelvis 04/08/08.  Findings: No unexpected calcifications are seen over the renal shadows, expected course of the ureters or urinary bladder. Specifically, the small left UVJ stone seen the patient's CT scan is not visible on this study.  Bowel gas pattern is normal.  No focal bony abnormality.  IMPRESSION: Negative for plain film evidence of urinary tract calculi.  Provider: Jennye Boroughs, Brooke Dare  No results found for this or any previous visit.  No results found for this or any previous visit.  No results found for this or any previous visit.  No results found for this or any previous visit.  No results found for this or any previous visit.  No results found for this or any previous visit.  No results found for this or any previous visit.   Assessment & Plan:    1. Prostate cancer metastatic to intrapelvic lymph node (HCC) -PSa today -Eligard 45mg  today -RTC 3 months for PSA   No follow-ups on file.  Nicolette Bang, MD  [facility] [place]

## 2020-09-01 NOTE — Progress Notes (Signed)
Urological Symptom Review  Patient is experiencing the following symptoms: Frequent urination Weak stream Erection problems (male only)   Review of Systems  Gastrointestinal (upper)  : Negative for upper GI symptoms  Gastrointestinal (lower) : Negative for lower GI symptoms  Constitutional : Fatigue  Skin: Negative for skin symptoms  Eyes: Blurred vision  Ear/Nose/Throat : Negative for Ear/Nose/Throat symptoms  Hematologic/Lymphatic: Negative for Hematologic/Lymphatic symptoms  Cardiovascular : Negative for cardiovascular symptoms  Respiratory : Cough Shortness of breath  Endocrine: Excessive thirst  Musculoskeletal: Back pain Joint pain  Neurological: Dizziness  Psychologic: Depression Anxiety

## 2020-09-02 ENCOUNTER — Encounter: Payer: Self-pay | Admitting: Cardiology

## 2020-09-02 ENCOUNTER — Ambulatory Visit (INDEPENDENT_AMBULATORY_CARE_PROVIDER_SITE_OTHER): Payer: Medicare Other | Admitting: Cardiology

## 2020-09-02 VITALS — BP 150/82 | HR 89 | Ht 70.0 in | Wt 162.0 lb

## 2020-09-02 DIAGNOSIS — I714 Abdominal aortic aneurysm, without rupture, unspecified: Secondary | ICD-10-CM

## 2020-09-02 DIAGNOSIS — I2510 Atherosclerotic heart disease of native coronary artery without angina pectoris: Secondary | ICD-10-CM | POA: Diagnosis not present

## 2020-09-02 DIAGNOSIS — E782 Mixed hyperlipidemia: Secondary | ICD-10-CM

## 2020-09-02 DIAGNOSIS — R911 Solitary pulmonary nodule: Secondary | ICD-10-CM

## 2020-09-02 LAB — PSA: Prostate Specific Ag, Serum: 1.3 ng/mL (ref 0.0–4.0)

## 2020-09-02 NOTE — Patient Instructions (Addendum)
Medication Instructions:   Your physician recommends that you continue on your current medications as directed. Please refer to the Current Medication list given to you today.  Labwork:  None  Testing/Procedures:  None  Follow-Up:  Your physician recommends that you schedule a follow-up appointment in: 6 months.  Any Other Special Instructions Will Be Listed Below (If Applicable).  We will contact you once we find out which cholesterol medication you took in the past.  If you need a refill on your cardiac medications before your next appointment, please call your pharmacy.

## 2020-09-02 NOTE — Progress Notes (Signed)
Cardiology Office Note  Date: 09/02/2020   ID: Stephen Che., DOB 04-Oct-1951, MRN [id number]  PCP:  Stephen Samples, PA-C  Cardiologist:  Stephen Lesches, MD Electrophysiologist:  None   Chief Complaint  Patient presents with  . Cardiac follow-up    History of Present Illness: Stephen Palmer. is a 69 y.o. male former patient of Dr. Bronson Palmer now presenting to establish follow-up with me.  I reviewed his records and updated the chart.  He was last seen in February 2020. He presents reporting no obvious angina symptoms, stable dyspnea on exertion.  He continues to follow with radiation oncology, history of non-small cell carcinoma status post XRT.  He follows with Dr. Scot Palmer with [facility].  CT imaging of the chest, abdomen, and pelvis noted in April as reviewed below.  He does not report any obvious symptoms associated with his abdominal aortic aneurysm.  He did bring in recent lipid panel as detailed below, LDL 174.  LDL was 184 back in 2019.  He recalls taking a statin several years ago which he did not tolerate due to leg weakness and fatigue, cannot recall the name.  It may have been simvastatin per discussion today but we are trying to obtain more information.  He has not tried any other statin preparations.  I personally reviewed his ECG today which shows sinus rhythm with possible left atrial enlargement.  Past Medical History:  Diagnosis Date  . Abdominal aortic aneurysm (AAA) ([place])   . Anxiety   . COPD (chronic obstructive pulmonary disease) ([place])   . Coronary artery calcification seen on CT scan   . Essential hypertension   . Lung cancer ([place])    Non small cell carcinoma - XRT  . Prostate cancer ([place])    XRT  . Stroke ([place])   . Type 2 diabetes mellitus ([place])     Past Surgical History:  Procedure Laterality Date  . COLONOSCOPY N/A 11/23/2015   Procedure: COLONOSCOPY;  Surgeon: Aviva Signs, MD;  [facility];  Service: Gastroenterology;   Laterality: N/A;  . FRACTURE SURGERY    . PROSTATE BIOPSY    . SINUS SURGERY WITH INSTATRAK      Current Outpatient Medications  Medication Sig Dispense Refill  . Albuterol (VENTOLIN IN) Inhale into the lungs every 8 (eight) hours as needed.     Marland Kitchen alprazolam (XANAX) 2 MG tablet Take 2 mg by mouth 2 (two) times daily.    . cyclobenzaprine (FLEXERIL) 10 MG tablet Take 10 mg by mouth 3 (three) times daily as needed.     . doxycycline (VIBRAMYCIN) 100 MG capsule Take 1 capsule (100 mg total) by mouth every 12 (twelve) hours. 28 capsule 0  . DULoxetine (CYMBALTA) 30 MG capsule Take 30 mg by mouth daily.    Marland Kitchen glipiZIDE (GLUCOTROL) 5 MG tablet Take 1 tablet (5 mg total) by mouth 2 (two) times daily before a meal. 180 tablet 1  . magnesium 30 MG tablet Take 400 mg by mouth daily.    . milk thistle 175 MG tablet Take 175 mg by mouth daily.    . tamsulosin (FLOMAX) 0.4 MG CAPS capsule Take 1 capsule (0.4 mg total) by mouth in the morning and at bedtime. 60 capsule 11  . vitamin C (ASCORBIC ACID) 500 MG tablet Take 500 mg by mouth daily.    . vitamin E 1000 UNIT capsule Take 1,000 Units by mouth daily.    Marland Kitchen zinc gluconate 50 MG tablet  Take 50 mg by mouth daily.     No current facility-administered medications for this visit.   Allergies:  Sulfa antibiotics and Sulfasalazine   ROS:  No syncope.  Physical Exam: VS:  BP (!) 150/82   Pulse 89   Ht 5\' 10"  (1.778 m)   Wt 162 lb (73.5 kg)   SpO2 91%   BMI 23.24 kg/m , BMI Body mass index is 23.24 kg/m.  Wt Readings from Last 3 Encounters:  09/02/20 162 lb (73.5 kg)  09/01/20 155 lb (70.3 kg)  07/06/20 165 lb (74.8 kg)    General: Patient appears comfortable at rest. HEENT: Conjunctiva and lids normal, wearing a mask. Neck: Supple, no elevated JVP or carotid bruits, no thyromegaly. Lungs: Clear to auscultation, nonlabored breathing at rest. Cardiac: Regular rate and rhythm, no S3, soft systolic murmur, no pericardial rub. Extremities: No  pitting edema, distal pulses 2+.  ECG:  An ECG dated 05/15/2019 was personally reviewed today and demonstrated:  Sinus tachycardia with PAC.  Recent Labwork: 12/31/2019: ALT 28; AST 20 05/19/2020: BUN 14; Creat 1.13; Potassium 4.7; Sodium 140     Component Value Date/Time   CHOL 275 (A) 07/09/2018 0000   CHOL 225 (A) 07/09/2018 0000   TRIG 244 (A) 07/09/2018 0000   TRIG 187 (A) 07/09/2018 0000   HDL 42 07/09/2018 0000   HDL 36 07/09/2018 0000   LDLCALC 184 07/09/2018 0000   LDLCALC 152 07/09/2018 0000  September 2021: Cholesterol 243, triglycerides 125, HDL 46, LDL 174  Other Studies Reviewed Today:  Lexiscan Myoview 08/22/2018:  Normal perfusion. No significant ischemia or scar  This is a low risk study.  Nuclear stress EF: 70%.  CT chest, abdomen, and pelvis 04/12/2020: IMPRESSION: 1. Stable dilatation of aortic arch and descending thoracic aorta up to 3.2 cm. Recommend annual imaging followup by CTA or MRA. This recommendation follows 2010 ACCF/AHA/AATS/ACR/ASA/SCA/SCAI/SIR/STS/SVM Guidelines for the Diagnosis and Management of Patients with Thoracic Aortic Disease. Circulation.2010; 121: G315-V761 2. 5.3 cm infrarenal abdominal aortic aneurysm (previously 5.2) without complicating features. Recommend followup by abdomen and pelvis CTA in 3-6 months, and vascular surgery referral/consultation if not already obtained. This recommendation follows ACR consensus guidelines: White Paper of the ACR Incidental Findings Committee II on Vascular Findings. J Am Coll Radiol 2013; 10:789-794. 3. 0.9 cm left upper lobe pulmonary nodule (previously 0.7), with increasing linear opacities extending towards the pleural laterally and anteriorly. PET-CT may be useful to differentiate residual/recurrent neoplasm from scarring/atelectasis. 4. 1.5 cm enlarged right external iliac lymph node, new since previous. Correlate with PSA. Follow-up recommended.  Aortic Atherosclerosis (ICD10-I70.0)  and Emphysema (ICD10-J43.9).  Assessment and Plan:  1.  Mixed hyperlipidemia, recent LDL 174.  He reports intolerance to single statin preparation several years ago with PCP, cannot recall the name although we are checking with his PCP.  It may have been simvastatin. He has not tried any other statins and we discussed perhaps initiating low-dose Crestor.  2.  Abdominal aortic aneurysm, infrarenal and 5.3 cm by CT imaging in April.  He is asymptomatic and continues to follow with Dr. Scot Palmer.  He also has stable dilatation of the aortic arch and descending thoracic aorta no larger than 3.2 cm.  3.  History of non-small cell lung cancer status post XRT with continued follow-up by radiation oncology.  4.  Coronary artery calcification by CT imaging, no obvious angina symptoms.  Another strong indication for statin therapy if tolerated.  Medication Adjustments/Labs and Tests Ordered: Current medicines are reviewed  at length with the patient today.  Concerns regarding medicines are outlined above.   Tests Ordered: Orders Placed This Encounter  Procedures  . EKG 12-Lead    Medication Changes: No orders of the defined types were placed in this encounter.   Disposition:  Follow up 6 months.  Signed, Satira Sark, MD, [facility] 09/02/2020 10:43 AM    [facility] [place], [place], [place] [address] Phone: [phone]; Fax: [phone]

## 2020-09-03 ENCOUNTER — Telehealth: Payer: Self-pay | Admitting: Cardiology

## 2020-09-03 MED ORDER — ROSUVASTATIN CALCIUM 5 MG PO TABS: 5.0000 mg | ORAL_TABLET | Freq: Every day | ORAL | 3 refills | Status: DC

## 2020-09-03 NOTE — Telephone Encounter (Signed)
Please start Crestor 5 mg once daily.  If he tolerates this would recheck FLP and LFTs in 12 weeks.

## 2020-09-03 NOTE — Telephone Encounter (Signed)
PLEASE GIVE PT A CALL CONCERNING CHOLESTEROL MEDICATION

## 2020-09-03 NOTE — Telephone Encounter (Signed)
Advised that we still have not received information from his PCP with names of statin drugs he's tried in the past. Says he is willing to go ahead and start crestor.

## 2020-09-03 NOTE — Telephone Encounter (Signed)
Patient informed and verbalized understanding of plan. Reports taking simvastatin and atorvastatin in the past that caused muscle pain. Both meds added to intolerance list. Aware to contact office in 12 weeks to get FLP & LFT's if able to tolerate crestor.

## 2020-09-07 NOTE — Progress Notes (Signed)
Letter sent via my chart

## 2020-10-06 ENCOUNTER — Encounter: Payer: Self-pay | Admitting: Nutrition

## 2020-10-06 ENCOUNTER — Ambulatory Visit (INDEPENDENT_AMBULATORY_CARE_PROVIDER_SITE_OTHER): Payer: Medicare Other | Admitting: Nurse Practitioner

## 2020-10-06 ENCOUNTER — Encounter: Payer: Medicare Other | Attending: "Endocrinology | Admitting: Nutrition

## 2020-10-06 ENCOUNTER — Encounter: Payer: Self-pay | Admitting: Nurse Practitioner

## 2020-10-06 ENCOUNTER — Other Ambulatory Visit: Payer: Self-pay

## 2020-10-06 VITALS — BP 145/89 | HR 84 | Ht 70.0 in | Wt 165.6 lb

## 2020-10-06 VITALS — Ht 70.0 in | Wt 165.5 lb

## 2020-10-06 DIAGNOSIS — E118 Type 2 diabetes mellitus with unspecified complications: Secondary | ICD-10-CM | POA: Insufficient documentation

## 2020-10-06 DIAGNOSIS — E1165 Type 2 diabetes mellitus with hyperglycemia: Secondary | ICD-10-CM

## 2020-10-06 DIAGNOSIS — E782 Mixed hyperlipidemia: Secondary | ICD-10-CM | POA: Insufficient documentation

## 2020-10-06 DIAGNOSIS — E559 Vitamin D deficiency, unspecified: Secondary | ICD-10-CM

## 2020-10-06 DIAGNOSIS — E1159 Type 2 diabetes mellitus with other circulatory complications: Secondary | ICD-10-CM

## 2020-10-06 DIAGNOSIS — F17200 Nicotine dependence, unspecified, uncomplicated: Secondary | ICD-10-CM

## 2020-10-06 DIAGNOSIS — N183 Chronic kidney disease, stage 3 unspecified: Secondary | ICD-10-CM | POA: Insufficient documentation

## 2020-10-06 DIAGNOSIS — C3492 Malignant neoplasm of unspecified part of left bronchus or lung: Secondary | ICD-10-CM | POA: Diagnosis not present

## 2020-10-06 DIAGNOSIS — I2510 Atherosclerotic heart disease of native coronary artery without angina pectoris: Secondary | ICD-10-CM

## 2020-10-06 DIAGNOSIS — IMO0002 Reserved for concepts with insufficient information to code with codable children: Secondary | ICD-10-CM

## 2020-10-06 LAB — POCT GLYCOSYLATED HEMOGLOBIN (HGB A1C): Hemoglobin A1C: 7.5 % — AB (ref 4.0–5.6)

## 2020-10-06 NOTE — Progress Notes (Signed)
10/06/2020, 1:41 PM          Endocrinology follow-up note   Subjective:    Patient ID: Stephen Che., male    DOB: 04/26/1951.  Stephen Che. is being seen in follow up in the management of his currently  uncontrolled symptomatic type 2 diabetes requested by  Scherrie Bateman   Past Medical History:  Diagnosis Date  . Abdominal aortic aneurysm (AAA) ([place])   . Anxiety   . COPD (chronic obstructive pulmonary disease) ([place])   . Coronary artery calcification seen on CT scan   . Essential hypertension   . Lung cancer ([place])    Non small cell carcinoma - XRT  . Prostate cancer ([place])    XRT  . Stroke ([place])   . Type 2 diabetes mellitus ([place])    Past Surgical History:  Procedure Laterality Date  . COLONOSCOPY N/A 11/23/2015   Procedure: COLONOSCOPY;  Surgeon: Aviva Signs, MD;  [facility];  Service: Gastroenterology;  Laterality: N/A;  . FRACTURE SURGERY    . PROSTATE BIOPSY    . SINUS SURGERY WITH INSTATRAK     Social History   Socioeconomic History  . Marital status: Married    Spouse name: Not on file  . Number of children: Not on file  . Years of education: Not on file  . Highest education level: Not on file  Occupational History  . Not on file  Tobacco Use  . Smoking status: Current Every Day Smoker    Packs/day: 2.00    Years: 44.00    Pack years: 88.00    Types: Cigarettes  . Smokeless tobacco: Never Used  Vaping Use  . Vaping Use: Never used  Substance and Sexual Activity  . Alcohol use: No    Alcohol/week: 0.0 standard drinks  . Drug use: No  . Sexual activity: Not on file  Other Topics Concern  . Not on file  Social History Narrative  . Not on file   Social Determinants of Health   Financial Resource Strain:   . Difficulty of Paying Living Expenses: Not on file  Food Insecurity:   . Worried About Charity fundraiser in the Last Year: Not on file  . Ran Out of Food in the Last Year: Not on  file  Transportation Needs:   . Lack of Transportation (Medical): Not on file  . Lack of Transportation (Non-Medical): Not on file  Physical Activity:   . Days of Exercise per Week: Not on file  . Minutes of Exercise per Session: Not on file  Stress:   . Feeling of Stress : Not on file  Social Connections:   . Frequency of Communication with Friends and Family: Not on file  . Frequency of Social Gatherings with Friends and Family: Not on file  . Attends Religious Services: Not on file  . Active Member of Clubs or Organizations: Not on file  . Attends Archivist Meetings: Not on file  . Marital Status: Not on file   Outpatient Encounter Medications as of 10/06/2020  Medication Sig  . Albuterol (VENTOLIN IN) Inhale into the lungs every 8 (eight) hours as needed.   Marland Kitchen alprazolam (XANAX) 2 MG tablet Take 2 mg by mouth 2 (two) times daily.  . cyclobenzaprine (FLEXERIL) 10 MG tablet Take 10 mg by mouth 3 (three) times daily as needed.   . doxycycline (VIBRAMYCIN) 100 MG capsule Take 1  capsule (100 mg total) by mouth every 12 (twelve) hours.  . DULoxetine (CYMBALTA) 30 MG capsule Take 30 mg by mouth daily.  Marland Kitchen glipiZIDE (GLUCOTROL) 5 MG tablet Take 1 tablet (5 mg total) by mouth 2 (two) times daily before a meal.  . magnesium 30 MG tablet Take 400 mg by mouth daily.  . milk thistle 175 MG tablet Take 175 mg by mouth daily.  . rosuvastatin (CRESTOR) 5 MG tablet Take 1 tablet (5 mg total) by mouth daily.  . tamsulosin (FLOMAX) 0.4 MG CAPS capsule Take 1 capsule (0.4 mg total) by mouth in the morning and at bedtime.  . vitamin C (ASCORBIC ACID) 500 MG tablet Take 500 mg by mouth daily.  . vitamin E 1000 UNIT capsule Take 1,000 Units by mouth daily.  Marland Kitchen zinc gluconate 50 MG tablet Take 50 mg by mouth daily.  Marland Kitchen albuterol (PROVENTIL) (2.5 MG/3ML) 0.083% nebulizer solution Take 2.5 mg by nebulization every 6 (six) hours as needed.   No facility-administered encounter medications on file as  of 10/06/2020.    ALLERGIES: Allergies  Allergen Reactions  . Sulfa Antibiotics Hives, Itching and Swelling    Tongue swelling  . Sulfasalazine Hives, Itching and Swelling    Tongue swelling  . Lipitor [Atorvastatin] Other (See Comments)    myalgia  . Simvastatin Other (See Comments)    myalgia    VACCINATION STATUS:  There is no immunization history on file for this patient.  Diabetes He presents for his follow-up diabetic visit. He has type 2 diabetes mellitus. Onset time: He was diagnosed at approximate age of 69 years. His disease course has been stable. There are no hypoglycemic associated symptoms. Pertinent negatives for hypoglycemia include no confusion, headaches, pallor or seizures. Associated symptoms include blurred vision. Pertinent negatives for diabetes include no chest pain, no fatigue, no polydipsia, no polyphagia, no polyuria and no weakness. There are no hypoglycemic complications. Symptoms are stable. Diabetic complications include a CVA. Risk factors for coronary artery disease include diabetes mellitus, hypertension, male sex, tobacco exposure, sedentary lifestyle and dyslipidemia. Current diabetic treatment includes oral agent (monotherapy). He is compliant with treatment most of the time. His weight is increasing steadily. He is following a generally unhealthy diet. When asked about meal planning, he reported none. He has not had a previous visit with a dietitian. He never participates in exercise. His home blood glucose trend is fluctuating minimally. His breakfast blood glucose range is generally 90-110 mg/dl. His overall blood glucose range is 110-130 mg/dl. (He presents today with his meter and logs showing near target fasting and postprandial glycemic profile.  His POCT A1C today is 7.5%, increasing from last visit of 6.9%.  His glucometer shows 7-day average of 125; 14- day average of 124; and 30-day average of 125.  He is on ongoing injections for cancer treatment.   There appears to be some discrepancy between the A1C and his average glucose, perhaps due to some anemia.) An ACE inhibitor/angiotensin II receptor blocker is not being taken. He does not see a podiatrist.Eye exam is current.   Review of systems  Constitutional: + Minimally fluctuating body weight,  current Body mass index is 23.76 kg/m. , no fatigue, no subjective hyperthermia, no subjective hypothermia Eyes: no blurry vision, no xerophthalmia ENT: no sore throat, no nodules palpated in throat, no dysphagia/odynophagia, no hoarseness Cardiovascular: no chest pain, no shortness of breath, no palpitations, no leg swelling Respiratory: no cough, no shortness of breath Gastrointestinal: no nausea/vomiting/diarrhea Musculoskeletal: no muscle/joint  aches Skin: no rashes, no hyperemia Neurological: no tremors, no numbness, no tingling, no dizziness Psychiatric: no depression, no anxiety    Objective:    BP (!) 145/89 (BP Location: Right Arm, Patient Position: Sitting)   Pulse 84   Ht 5\' 10"  (1.778 m)   Wt 165 lb 9.6 oz (75.1 kg)   BMI 23.76 kg/m   Wt Readings from Last 3 Encounters:  10/06/20 165 lb 8 oz (75.1 kg)  10/06/20 165 lb 9.6 oz (75.1 kg)  09/02/20 162 lb (73.5 kg)    BP Readings from Last 3 Encounters:  10/06/20 (!) 145/89  09/02/20 (!) 150/82  09/01/20 124/78      Physical Exam- Limited  Constitutional:  Body mass index is 23.76 kg/m. , not in acute distress, normal state of mind Eyes:  EOMI, no exophthalmos Neck: Supple Thyroid: No gross goiter Cardiovascular: RRR, no murmers, rubs, or gallops, no edema Respiratory: Adequate breathing efforts, no crackles, rales, rhonchi, or wheezing Musculoskeletal: no gross deformities, strength intact in all four extremities, no gross restriction of joint movements Skin:  no rashes, no hyperemia, nicotinic discoloration to bilateral fingers with clubbing. Neurological: no tremor with outstretched hands    Recent Results  (from the past 2160 hour(s))  PSA     Status: None   Collection Time: 09/01/20  3:01 PM  Result Value Ref Range   Prostate Specific Ag, Serum 1.3 0.0 - 4.0 ng/mL    Comment: Roche ECLIA methodology. According to the American Urological Association, Serum PSA should decrease and remain at undetectable levels after radical prostatectomy. The AUA defines biochemical recurrence as an initial PSA value 0.2 ng/mL or greater followed by a subsequent confirmatory PSA value 0.2 ng/mL or greater. Values obtained with different assay methods or kits cannot be used interchangeably. Results cannot be interpreted as absolute evidence of the presence or absence of malignant disease.   Urinalysis, Routine w reflex microscopic     Status: None   Collection Time: 09/01/20  3:33 PM  Result Value Ref Range   Specific Gravity, UA 1.010 1.005 - 1.030   pH, UA 5.5 5.0 - 7.5   Color, UA Yellow Yellow   Appearance Ur Clear Clear   Leukocytes,UA Negative Negative   Protein,UA Negative Negative/Trace   Glucose, UA Negative Negative   Ketones, UA Negative Negative   RBC, UA Negative Negative   Bilirubin, UA Negative Negative   Urobilinogen, Ur 0.2 0.2 - 1.0 mg/dL   Nitrite, UA Negative Negative   Microscopic Examination Comment     Comment: Microscopic follows if indicated.  HgB A1c     Status: Abnormal   Collection Time: 10/06/20  1:11 PM  Result Value Ref Range   Hemoglobin A1C 7.5 (A) 4.0 - 5.6 %   HbA1c POC (<> result, manual entry)     HbA1c, POC (prediabetic range)     HbA1c, POC (controlled diabetic range)        Assessment & Plan:   1. DM type 2 causing vascular disease ([place])  - Stephen Che. has currently uncontrolled symptomatic type 2 DM since 69 years of age.  He presents today with his meter and logs showing near target fasting and postprandial glycemic profile.  His POCT A1C today is 7.5%, increasing from last visit of 6.9%.  His glucometer shows 7-day average of 125; 14- day  average of 124; and 30-day average of 125.  He is on ongoing injections for cancer treatment.  There appears to be some  discrepancy between the A1C and his average glucose, perhaps due to some anemia.  Recent labs are reviewed with him.    -his diabetes is complicated by CVA , chronic heavy smoking, and he remains at extremely high risk for more acute and chronic complications which include CAD, CVA, CKD, retinopathy, and neuropathy. These are all discussed in detail with him.  - Nutritional counseling repeated at each appointment due to patients tendency to fall back in to old habits.  - The patient admits there is a room for improvement in their diet and drink choices. -  Suggestion is made for the patient to avoid simple carbohydrates from their diet including Cakes, Sweet Desserts / Pastries, Ice Cream, Soda (diet and regular), Sweet Tea, Candies, Chips, Cookies, Sweet Pastries,  Store Bought Juices, Alcohol in Excess of  1-2 drinks a day, Artificial Sweeteners, Coffee Creamer, and "Sugar-free" Products. This will help patient to have stable blood glucose profile and potentially avoid unintended weight gain.   - I encouraged the patient to switch to  unprocessed or minimally processed complex starch and increased protein intake (animal or plant source), fruits, and vegetables.   - Patient is advised to stick to a routine mealtimes to eat 3 meals  a day and avoid unnecessary snacks ( to snack only to correct hypoglycemia).   - I have approached him with the following individualized plan to manage diabetes and patient agrees:   -Based on his near target glycemic presentation, he will do well to continue on his current medication regimen.  He is advised to continue Glipizide 5 mg po twice daily with meals.    -He is encouraged to monitor blood glucose at least twice daily, before breakfast and before bed, and call clinic if he has readings less than 70 or greater than 200 for 3 tests in a  row.  -He is not a suitable candidate for metformin, SGLT2 inhibitors, nor incretin therapy.  - Patient specific target  A1c;  LDL, HDL, Triglycerides  were discussed in detail.  2) BP/HTN:  His blood pressure is not controlled to target.  He is not currently on any antihypertensive medications at this time.  He will be considered for antihypertensive medication at next visit if BP continues to be elevated.  3) Lipids/HPL:  There is no recent lipid panel to review.  His cardiologist put him on a trial of Crestor 5 mg po daily at bedtime.  Side effects and precautions discussed with him.  4)  Weight/Diet:  His Body mass index is 23.76 kg/m.-weight loss is not advisable for him.  CDE Consult will be initiated . Exercise, and detailed carbohydrates information provided  -  detailed on discharge instructions.  5) Chronic Care/Health Maintenance: -he is on Statin medications and is encouraged to initiate and continue to follow up with Ophthalmology, Dentist,  Podiatrist at least yearly or according to recommendations, and advised to Quit smoking. I have recommended yearly flu vaccine and pneumonia vaccine at least every 5 years; moderate intensity exercise for up to 150 minutes weekly; and  sleep for at least 7 hours a day.  The patient was counseled on the dangers of tobacco use, and was advised to quit.  Reviewed strategies to maximize success, including removing cigarettes and smoking materials from environment.    - I advised patient to maintain close follow up with Stephen Samples, PA-C for primary care needs.   - Time spent on this patient care encounter:  35 min, of which >  50% was spent in  counseling and the rest reviewing his blood glucose logs , discussing his hypoglycemia and hyperglycemia episodes, reviewing his current and  previous labs / studies  ( including abstraction from other facilities) and medications  doses and developing a  long term treatment plan and documenting  his care.   Please refer to Patient Instructions for Blood Glucose Monitoring and Insulin/Medications Dosing Guide"  in media tab for additional information. Please  also refer to " Patient Self Inventory" in the Media  tab for reviewed elements of pertinent patient history.  Stephen Che. participated in the discussions, expressed understanding, and voiced agreement with the above plans.  All questions were answered to his satisfaction. he is encouraged to contact clinic should he have any questions or concerns prior to his return visit.   Follow up plan: - Return in about 4 months (around 02/06/2021) for Diabetes follow up- A1c and urine micro in office, Previsit labs.  Rayetta Pigg, [facility] [address] [place], [place] [address] Phone: [phone] Fax: [phone]   10/06/2020, 1:41 PM  This note was partially dictated with voice recognition software. Similar sounding words can be transcribed inadequately or may not  be corrected upon review.

## 2020-10-06 NOTE — Patient Instructions (Signed)

## 2020-10-06 NOTE — Progress Notes (Signed)
Medical Nutrition Therapy:  Appt start time: 1330  end time: 1400 Assessment:  Primary concerns today: Diabetes Type 2. PMH: Hyperlipidemia, HTN.  A1C 7.5%. Improved. Saw Rayetta Pigg, FNP  today. Glipizide to 5 mg BID.     Still working on eating meals on  Time. Currently Lupron has lowered his PSA but he thinks it has increased his A1C .  Still issues with sleeping. Tends to stay up at night and sleeps during the day. Eats later at night. Mostly eating more at home. He notes his wife is doing a good job with providing better balanced meals.. Slept most of the day yesterday. Can't walk a lot due to his stroke.  He notes he is depressed. Has seen therapist in the past. He feels like the depression is getting worse. He feels like he would benefit from some adjustments in his medications but doesn't want to see a therapist or psychiatrist.   Lab Results  Component Value Date   HGBA1C 7.5 (A) 10/06/2020    CMP Latest Ref Rng & Units 05/19/2020 12/31/2019 06/25/2019  Glucose 65 - 139 mg/dL 88 142(H) 143(H)  BUN 7 - 25 mg/dL 14 17 18   Creatinine 0.70 - 1.25 mg/dL 1.13 1.20 1.18  Sodium 135 - 146 mmol/L 140 140 140  Potassium 3.5 - 5.3 mmol/L 4.7 4.8 5.3  Chloride 98 - 110 mmol/L 104 103 105  CO2 20 - 32 mmol/L 28 31 27   Calcium 8.6 - 10.3 mg/dL 9.8 9.3 9.8  Total Protein 6.1 - 8.1 g/dL - 7.0 7.2  Total Bilirubin 0.2 - 1.2 mg/dL - 0.3 0.3  AST 10 - 35 U/L - 20 17  ALT 9 - 46 U/L - 28 23   Lipid Panel     Component Value Date/Time   CHOL 275 (A) 07/09/2018 0000   CHOL 225 (A) 07/09/2018 0000   TRIG 244 (A) 07/09/2018 0000   TRIG 187 (A) 07/09/2018 0000   HDL 42 07/09/2018 0000   HDL 36 07/09/2018 0000   LDLCALC 184 07/09/2018 0000   LDLCALC 152 07/09/2018 0000     Preferred Learning Style:   No preference indicated   Learning Readiness:  Change in progress  MEDICATIONS:   DIETARY INTAKE: 24-hr recall:  B ( AM):  Shredded wheat and eggs 3 , water Snk ( AM):  L (  PM): slept Snk ( PM): D ( PM):hamburger steak, saurakraut, mashed potatoes, water Snk ( PM):  Beverages: water  Usual physical activity: ADL  Estimated energy needs: 1800-200- calories 200  g carbohydrates 135 g protein 50 g fat  Progress Towards Goal(s):  In progress.   Nutritional Diagnosis:  NB-1.1 Food and nutrition-related knowledge deficit As related to Diabetes and Hyperlipidemia.  As evidenced by A1C 7.3% and elevated lipd levels..    Intervention:  Nutrition and Diabetes education provided on My Plate, CHO counting, meal planning, portion sizes, timing of meals, avoiding snacks between meals unless having a low blood sugar, target ranges for A1C and blood sugars, signs/symptoms and treatment of hyper/hypoglycemia, monitoring blood sugars, taking medications as prescribed, benefits of exercising 30 minutes per day and prevention of complications of DM.  Goals  Try not to slept through the meals Try Carnation Instant breakfast for a meal replacement. Eat breakfast by 9 am, lunch 12-2 and dinner between 5-7 pm.  .   Teaching Method Utilized:  Visual Auditory Hands on  Handouts given during visit include:  The Plate Method   Meal Plan Card  Barriers to learning/adherence to lifestyle change: none  Demonstrated degree of understanding via:  Teach Back   Monitoring/Evaluation:  Dietary intake, exercise, meal planning , and body weight 3-4 months.Marland Kitchen

## 2020-10-06 NOTE — Patient Instructions (Signed)
Goals  Try not to slept through the meals Try Carnation Instant breakfast for a meal replacement. Eat breakfast by 9 am, lunch 12-2 and dinner between 5-7 pm.

## 2020-11-08 ENCOUNTER — Telehealth: Payer: Self-pay | Admitting: *Deleted

## 2020-11-08 NOTE — Telephone Encounter (Signed)
CALLED PATIENT TO INFORM THAT FU VISIT ON 11-10-20 WILL BE OVER THE TELEPHONE NOT IN PERSON, LVM FOR A RETURN CALL

## 2020-11-10 ENCOUNTER — Ambulatory Visit: Payer: Medicare Other | Admitting: Urology

## 2020-11-16 DIAGNOSIS — J439 Emphysema, unspecified: Secondary | ICD-10-CM | POA: Diagnosis not present

## 2020-11-16 DIAGNOSIS — C3490 Malignant neoplasm of unspecified part of unspecified bronchus or lung: Secondary | ICD-10-CM | POA: Diagnosis not present

## 2020-11-16 DIAGNOSIS — C3492 Malignant neoplasm of unspecified part of left bronchus or lung: Secondary | ICD-10-CM | POA: Diagnosis not present

## 2020-11-17 NOTE — Progress Notes (Signed)
Radiation Oncology         [phone]) [phone] ________________________________  Name: Stephen Palmer. MRN: [id number]  Date: 11/18/2020  DOB: 10/27/1951  Post Treatment Note- Conducted via telemedicine WebEx due to current COVID-19 concerns for limiting patient exposure  CC: Stephen Samples, PA-C  Stephen Poot, MD  Diagnosis:    69 y.o. male with 3 cm squamous cell carcinoma of the left upper lobe of the lung - Stage IA     Interval Since Last Radiation:  3 year, 2 months  Curative, Definitive SBRT:  10/05/2017, 10/09/2017, 10/12/2017- The target was treated to 54 Gy in 3 fractions of 18 Gy.  12/13/16 - 02/09/17: Prostate IMRT ; 40 fractions to 78 Gy in combination with LT-ADT under the care of Dr. Lianne Cure in [place], [place].  Narrative:  I spoke with the patient via WebEx to spare the patient unnecessary potential exposure in the healthcare setting during the current COVID-19 pandemic.  The patient was notified in advance and gave permission to proceed with this visit format. He has recovered well from the effects of radiotherapy and remains without complaints.  He had a recent CT Chest on 11/30/21which shows a stable appearance of the previously treated left upper lobe nodule and no evidence of disease progression or metastatic disease. He continues in routine follow-up with his vascular specialist, Dr. Scot Dock, for monitoring of the known infrarenal AAA which has remained radiographically stable.   Since we saw him last, he has had a recurrence of his prostate cancer in a solitary pelvic lymph node noted on CT imaging in 03/2020. Given his prior history of prostate cancer, he was referred back to Urology for further evaluation and was noted to have significant elevated PSA at 8.7 on 05/11/20 and further increased to 9.7 when repeated on 05/19/20. He is being followed by Dr. Alyson Ingles and started ADT on 06/30/20. PSA responded favorably with a decrease to 1.3 on 09/01/20, at which time he received a 6  month Eligard injection which he is tolerating well. His next scheduled follow up with Dr. Alyson Ingles is on 12/01/20.                     On review of systems, the patient states that he is doing well overall. He continues with a mild, occasional productive cough with clear to whitish sputum. He will occasionally have harsh coughing episodes that make it difficult to catch his breath and at times are painful.  He has continued using his inhalers as prescribed by his PCP but wonders if he should see a lung specialist.  He specifically denies hemoptysis, increased shortness of breath, chest pain, fevers, chills or night sweats.  He continues with mild fatigue which is not progressively worseining.  He admits to having a very sedentary lifestyle despite encouragement from his wife and endocrinologist to be more active.  He denies abdominal pain, N/V or diarrhea.  He has a healthy appetite and is maintaining his weight. He had a stroke in 2020 which he has recovered from fully.    ALLERGIES:  is allergic to sulfa antibiotics, sulfasalazine, lipitor [atorvastatin], and simvastatin.  Meds: Current Outpatient Medications  Medication Sig Dispense Refill  . albuterol (PROVENTIL) (2.5 MG/3ML) 0.083% nebulizer solution Take 2.5 mg by nebulization every 6 (six) hours as needed.    . Albuterol (VENTOLIN IN) Inhale into the lungs every 8 (eight) hours as needed.     Marland Kitchen alprazolam (XANAX) 2 MG tablet Take 2  mg by mouth 2 (two) times daily.    . cyclobenzaprine (FLEXERIL) 10 MG tablet Take 10 mg by mouth 3 (three) times daily as needed.     . doxycycline (VIBRAMYCIN) 100 MG capsule Take 1 capsule (100 mg total) by mouth every 12 (twelve) hours. 28 capsule 0  . DULoxetine (CYMBALTA) 30 MG capsule Take 30 mg by mouth daily.    Marland Kitchen glipiZIDE (GLUCOTROL) 5 MG tablet Take 1 tablet (5 mg total) by mouth 2 (two) times daily before a meal. 180 tablet 1  . magnesium 30 MG tablet Take 400 mg by mouth daily.    . milk thistle 175  MG tablet Take 175 mg by mouth daily.    . rosuvastatin (CRESTOR) 5 MG tablet Take 1 tablet (5 mg total) by mouth daily. 90 tablet 3  . tamsulosin (FLOMAX) 0.4 MG CAPS capsule Take 1 capsule (0.4 mg total) by mouth in the morning and at bedtime. 60 capsule 11  . vitamin C (ASCORBIC ACID) 500 MG tablet Take 500 mg by mouth daily.    . vitamin E 1000 UNIT capsule Take 1,000 Units by mouth daily.    Marland Kitchen zinc gluconate 50 MG tablet Take 50 mg by mouth daily.     No current facility-administered medications for this encounter.    Physical Findings:  vitals were not taken for this visit.   Unable to assess due to telephone follow-up visit format.  Lab Findings: Lab Results  Component Value Date   WBC 10.5 05/15/2019   HGB 14.8 05/15/2019   HCT 46.4 05/15/2019   MCV 96.5 05/15/2019   PLT 263 05/15/2019     Radiographic Findings: No results found.  Impression/Plan: 1.  69 y.o. 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    The patient continues to remain stable both clinically and radiographically. We will continue with serial CT scans of the chest with contrast and BMP performed at [facility] since this is closer to [place] in [place], [place] at six month intervals until 5 years when, at that point in time, he will have an annual low dose CT scan for surveillance. He will return in 6 months to review the findings from the scan to be ordered prior to that visit. He knows to call with any questions or concerns in the interim.  He is comfortable with this plan.  2. Infrarenal Abdominal Aortic Aneurysm: continue in follow up with dr. Scot Dock for continued monitoring and management. We will continue to monitor of follow up scans.  3. Recurrent prostate cancer with metastasis to pelvic lymph node. Continue in routine follow up with Dr. Alyson Ingles for continued monitoring and management.  Given current concerns for patient exposure during the COVID-19  pandemic, this encounter was conducted via [place]. The patient has given verbal consent for this type of encounter. The time spent during this encounter was 30 minutes. The attendants for this meeting include Stephen Lampton PA-C and patient, Stephen Palmer. During the encounter Stephen Lisenby PA-C and patient, Stephen Palmer, were located at [facility] Radiation Oncology Department.     Stephen Johns, PA-C

## 2020-11-18 ENCOUNTER — Encounter: Payer: Self-pay | Admitting: Urology

## 2020-11-18 ENCOUNTER — Other Ambulatory Visit: Payer: Self-pay

## 2020-11-18 ENCOUNTER — Ambulatory Visit
Admission: RE | Admit: 2020-11-18 | Discharge: 2020-11-18 | Disposition: A | Discharge status: Home / Self Care | Payer: Medicare Other | Source: Ambulatory Visit | Attending: Urology | Admitting: Urology

## 2020-11-18 DIAGNOSIS — C3492 Malignant neoplasm of unspecified part of left bronchus or lung: Secondary | ICD-10-CM | POA: Diagnosis not present

## 2020-11-18 DIAGNOSIS — Z08 Encounter for follow-up examination after completed treatment for malignant neoplasm: Secondary | ICD-10-CM | POA: Diagnosis not present

## 2020-11-22 ENCOUNTER — Other Ambulatory Visit: Payer: Self-pay | Admitting: *Deleted

## 2020-11-22 DIAGNOSIS — I714 Abdominal aortic aneurysm, without rupture, unspecified: Secondary | ICD-10-CM

## 2020-11-24 ENCOUNTER — Other Ambulatory Visit: Payer: Self-pay | Admitting: "Endocrinology

## 2020-11-25 ENCOUNTER — Other Ambulatory Visit: Payer: Medicare Other

## 2020-11-25 ENCOUNTER — Other Ambulatory Visit: Payer: Self-pay

## 2020-11-25 DIAGNOSIS — C61 Malignant neoplasm of prostate: Secondary | ICD-10-CM | POA: Diagnosis not present

## 2020-11-25 DIAGNOSIS — C775 Secondary and unspecified malignant neoplasm of intrapelvic lymph nodes: Secondary | ICD-10-CM | POA: Diagnosis not present

## 2020-11-26 LAB — PSA: Prostate Specific Ag, Serum: 0.5 ng/mL (ref 0.0–4.0)

## 2020-11-30 NOTE — Progress Notes (Signed)
Sent via mychart

## 2020-12-01 ENCOUNTER — Other Ambulatory Visit: Payer: Self-pay

## 2020-12-01 ENCOUNTER — Ambulatory Visit (INDEPENDENT_AMBULATORY_CARE_PROVIDER_SITE_OTHER): Payer: Medicare Other | Admitting: Vascular Surgery

## 2020-12-01 ENCOUNTER — Ambulatory Visit: Payer: Medicare Other | Admitting: Urology

## 2020-12-01 ENCOUNTER — Ambulatory Visit (HOSPITAL_COMMUNITY)
Admission: RE | Admit: 2020-12-01 | Discharge: 2020-12-01 | Disposition: A | Discharge status: Home / Self Care | Payer: Medicare Other | Source: Ambulatory Visit | Attending: Vascular Surgery | Admitting: Vascular Surgery

## 2020-12-01 ENCOUNTER — Encounter: Payer: Self-pay | Admitting: Vascular Surgery

## 2020-12-01 VITALS — BP 129/85 | HR 87 | Temp 97.7°F | Resp 20 | Ht 70.0 in | Wt 164.0 lb

## 2020-12-01 DIAGNOSIS — I714 Abdominal aortic aneurysm, without rupture, unspecified: Secondary | ICD-10-CM

## 2020-12-01 DIAGNOSIS — I2510 Atherosclerotic heart disease of native coronary artery without angina pectoris: Secondary | ICD-10-CM

## 2020-12-01 NOTE — Progress Notes (Signed)
REASON FOR VISIT:   Follow-up of abdominal aortic aneurysm  MEDICAL ISSUES:   PARARENAL ABDOMINAL AORTIC ANEURYSM: This patient's pararenal abdominal aortic aneurysm has enlarged slightly from 5.3 to 5.6 cm.  He is not a candidate for endovascular repair.  I think his options for repair would be a fenestrated graft versus open repair which would likely require a thoracoabdominal incision.  We have previously discussed, and discussed again today, referral to [facility] to establish a relationship as if the aneurysm continues to enlarge I think he will have to consider elective repair.  He is reluctant to do anything at this time.  I have recommended a follow-up CT angiogram of the chest abdomen pelvis in 6 months and I will see him back at that time.  He also has a known 3.2 cm ascending aortic aneurysm.  If the aneurysm continues to enlarge then again we will try to encourage him to see Dr. Sammuel Hines in consultation in [place] to discuss his options for repair.  We have again discussed the importance of tobacco cessation.  His blood pressures under good control.  I will see him back in 6 months.  He knows to call sooner if he has problems.   HPI:   Stephen Palmer. is a pleasant 69 y.o. male who I did a telemetry visit with on 04/14/2020.  I been following him with a 5.2 cm pararenal abdominal aortic aneurysm which was stable in size.  He is a heavy smoker and we previously discussed the importance of tobacco cessation.  I recommended that the aneurysm enlarged he should be considered for a fenestrated graft.  We have discussed evaluation in [facility] for either a fenestrated graft or a thoracoabdominal repair.   Since I saw the patient last he tells me that his PSA went up to 8 and he has been getting hormonal therapy with Lupron.  PSA is down to 0.5.  He has a follow-up with his urologist today.  He was treated for a lung cancer 2 to 3 years ago with radiation therapy.  He denies any  abdominal pain or back pain.  He continues to smoke 2 packs/day of cigarettes and has been smoking for 44 years.  We have discussed tobacco cessation on multiple occasions.  Past Medical History:  Diagnosis Date  . Abdominal aortic aneurysm (AAA) ([place])   . Anxiety   . COPD (chronic obstructive pulmonary disease) ([place])   . Coronary artery calcification seen on CT scan   . Essential hypertension   . Lung cancer ([place])    Non small cell carcinoma - XRT  . Prostate cancer ([employer])    XRT  . Stroke ([place])   . Type 2 diabetes mellitus ([facility])     Family History  Problem Relation Age of Onset  . Heart disease Father        before age 5  . Heart disease Mother   . Cancer Neg Hx     SOCIAL HISTORY: Social History   Tobacco Use  . Smoking status: Current Every Day Smoker    Packs/day: 2.00    Years: 44.00    Pack years: 88.00    Types: Cigarettes  . Smokeless tobacco: Never Used  Substance Use Topics  . Alcohol use: No    Alcohol/week: 0.0 standard drinks    Allergies  Allergen Reactions  . Sulfa Antibiotics Hives, Itching and Swelling    Tongue swelling  . Sulfasalazine Hives, Itching and Swelling  Tongue swelling  . Lipitor [Atorvastatin] Other (See Comments)    myalgia  . Simvastatin Other (See Comments)    myalgia    Current Outpatient Medications  Medication Sig Dispense Refill  . albuterol (PROVENTIL) (2.5 MG/3ML) 0.083% nebulizer solution Take 2.5 mg by nebulization every 6 (six) hours as needed.    . Albuterol (VENTOLIN IN) Inhale into the lungs every 8 (eight) hours as needed.     Marland Kitchen alprazolam (XANAX) 2 MG tablet Take 2 mg by mouth 2 (two) times daily.    . calcium-vitamin D (OSCAL WITH D) 500-200 MG-UNIT tablet Take 1 tablet by mouth.    . cyclobenzaprine (FLEXERIL) 10 MG tablet Take 10 mg by mouth 3 (three) times daily as needed.     . DULoxetine (CYMBALTA) 30 MG capsule Take 30 mg by mouth daily.    . ergocalciferol (VITAMIN D2) 1.25 MG (50000 UT) capsule  Take 50,000 Units by mouth once a week.    Marland Kitchen glipiZIDE (GLUCOTROL) 5 MG tablet TAKE 1 TABLET BY MOUTH TWICE DAILY BEFORE A MEAL 180 tablet 1  . magnesium 30 MG tablet Take 400 mg by mouth daily.    . milk thistle 175 MG tablet Take 175 mg by mouth daily.    . rosuvastatin (CRESTOR) 5 MG tablet Take 1 tablet (5 mg total) by mouth daily. 90 tablet 3  . tamsulosin (FLOMAX) 0.4 MG CAPS capsule Take 1 capsule (0.4 mg total) by mouth in the morning and at bedtime. 60 capsule 11  . vitamin C (ASCORBIC ACID) 500 MG tablet Take 500 mg by mouth daily.    . vitamin E 1000 UNIT capsule Take 1,000 Units by mouth daily.    Marland Kitchen zinc gluconate 50 MG tablet Take 50 mg by mouth daily.     No current facility-administered medications for this visit.    REVIEW OF SYSTEMS:  [X]  denotes positive finding, [ ]  denotes negative finding Cardiac  Comments:  Chest pain or chest pressure:    Shortness of breath upon exertion: x   Short of breath when lying flat:    Irregular heart rhythm:        Vascular    Pain in calf, thigh, or hip brought on by ambulation:    Pain in feet at night that wakes you up from your sleep:     Blood clot in your veins:    Leg swelling:         Pulmonary    Oxygen at home:    Productive cough:     Wheezing:         Neurologic    Sudden weakness in arms or legs:     Sudden numbness in arms or legs:     Sudden onset of difficulty speaking or slurred speech:    Temporary loss of vision in one eye:     Problems with dizziness:         Gastrointestinal    Blood in stool:     Vomited blood:         Genitourinary    Burning when urinating:     Blood in urine:        Psychiatric    Major depression:         Hematologic    Bleeding problems:    Problems with blood clotting too easily:        Skin    Rashes or ulcers:        Constitutional    Fever  or chills:     PHYSICAL EXAM:   Vitals:   12/01/20 0846  BP: 129/85  Pulse: 87  Resp: 20  Temp: 97.7 F (36.5 C)   SpO2: 92%  Weight: 164 lb (74.4 kg)  Height: 5\' 10"  (1.778 m)    GENERAL: The patient is a well-nourished male, in no acute distress. The vital signs are documented above. CARDIAC: There is a regular rate and rhythm.  VASCULAR: I do not detect carotid bruits. He has a slightly diminished right femoral pulse with a normal left femoral pulse. He has palpable posterior tibial pulses bilaterally. PULMONARY: There is good air exchange bilaterally without wheezing or rales. ABDOMEN: Soft and non-tender with normal pitched bowel sounds.  His aneurysm is palpable and nontender. MUSCULOSKELETAL: There are no major deformities or cyanosis. NEUROLOGIC: No focal weakness or paresthesias are detected. SKIN: There are no ulcers or rashes noted. PSYCHIATRIC: The patient has a normal affect.  DATA:    DUPLEX ABDOMINAL AORTA: I have independently interpreted his duplex of the abdominal aorta.  The maximum diameter of his infrarenal aorta is 5.64 cm.  The right common iliac artery measures 1.1 cm in maximum diameter.  The left common iliac artery measures 1.0 cm in maximum diameter.  I compared this to the previous CTA which was done in April of this year when the maximum diameter of the aneurysm was 5.3 cm.  I reviewed his previous CT scan which shows that the aneurysm extends up to the level of the SMA.  There is laminated thrombus at this level and he also has some thrombus in the thoracic aorta above this.  Deitra Mayo [facility] [phone]

## 2020-12-03 ENCOUNTER — Ambulatory Visit (INDEPENDENT_AMBULATORY_CARE_PROVIDER_SITE_OTHER): Payer: Medicare Other | Admitting: Urology

## 2020-12-03 ENCOUNTER — Encounter: Payer: Self-pay | Admitting: Urology

## 2020-12-03 ENCOUNTER — Other Ambulatory Visit: Payer: Self-pay

## 2020-12-03 VITALS — BP 99/72 | HR 94 | Temp 97.6°F | Ht 70.0 in | Wt 164.0 lb

## 2020-12-03 DIAGNOSIS — K047 Periapical abscess without sinus: Secondary | ICD-10-CM | POA: Diagnosis not present

## 2020-12-03 DIAGNOSIS — C61 Malignant neoplasm of prostate: Secondary | ICD-10-CM | POA: Diagnosis not present

## 2020-12-03 DIAGNOSIS — C775 Secondary and unspecified malignant neoplasm of intrapelvic lymph nodes: Secondary | ICD-10-CM

## 2020-12-03 DIAGNOSIS — I2510 Atherosclerotic heart disease of native coronary artery without angina pectoris: Secondary | ICD-10-CM | POA: Diagnosis not present

## 2020-12-03 DIAGNOSIS — Z6823 Body mass index (BMI) 23.0-23.9, adult: Secondary | ICD-10-CM | POA: Diagnosis not present

## 2020-12-03 LAB — URINALYSIS, ROUTINE W REFLEX MICROSCOPIC
Bilirubin, UA: NEGATIVE
Glucose, UA: NEGATIVE
Ketones, UA: NEGATIVE
Leukocytes,UA: NEGATIVE
Nitrite, UA: NEGATIVE
Protein,UA: NEGATIVE
RBC, UA: NEGATIVE
Specific Gravity, UA: 1.01 (ref 1.005–1.030)
Urobilinogen, Ur: 0.2 mg/dL (ref 0.2–1.0)
pH, UA: 5.5 (ref 5.0–7.5)

## 2020-12-03 MED ORDER — TAMSULOSIN HCL 0.4 MG PO CAPS: 0.4000 mg | ORAL_CAPSULE | Freq: Two times a day (BID) | ORAL | 11 refills | Status: DC

## 2020-12-03 NOTE — Progress Notes (Signed)
Urological Symptom Review  Patient is experiencing the following symptoms: Frequent urination Erection problems (male only)   Review of Systems  Gastrointestinal (upper)  : Negative for upper GI symptoms  Gastrointestinal (lower) : Negative for lower GI symptoms  Constitutional : Fatigue  Skin: Negative for skin symptoms  Eyes: Blurred vision  Ear/Nose/Throat : Negative for Ear/Nose/Throat symptoms  Hematologic/Lymphatic: Swollen glands  Cardiovascular : Negative for cardiovascular symptoms  Respiratory : Negative for respiratory symptoms  Endocrine: Excessive thirst  Musculoskeletal: Back pain  Neurological: Dizziness  Psychologic: Depression Anxiety

## 2020-12-03 NOTE — Patient Instructions (Signed)
Prostate Cancer  The prostate is a male gland that helps make semen. Prostate cancer is when abnormal cells grow in this gland. Follow these instructions at home:  Take over-the-counter and prescription medicines only as told by your doctor.  Eat a healthy diet.  Get plenty of sleep.  Ask your doctor for help to find a support group for men with prostate cancer.  Keep all follow-up visits as told by your doctor. This is important.  If you have to go to the hospital, let your cancer doctor (oncologist) know.  Touch, hold, hug, and caress your partner to continue to show sexual feelings. Contact a doctor if:  You have trouble peeing (urinating).  You have blood in your pee (urine).  You have pain in your hips, back, or chest. Get help right away if:  You have weakness in your legs.  You lose feeling (have numbness) in your legs.  You cannot control your pee or your poop (stool).  You have trouble breathing.  You have sudden pain in your chest.  You have chills or a fever. Summary  The prostate is a male gland that helps make semen. Prostate cancer is when abnormal cells grow in this gland.  Ask your doctor for help to find a support group for men with prostate cancer.  Contact a doctor if you have problems peeing or have any new pain that you did not have before. This information is not intended to replace advice given to you by your health care provider. Make sure you discuss any questions you have with your health care provider. Document Revised: 11/16/2017 Document Reviewed: 08/14/2016 Elsevier Patient Education  2020 Elsevier Inc.  

## 2020-12-03 NOTE — Progress Notes (Signed)
12/03/2020 1:30 PM   Stephen Palmer. 03/20/1951 [id number]  Referring provider: [name], PA-C [address] [place],  [place] [address]  followup metastatic prostate cancer  HPI: Stephen Palmer is a 69QQ here for followup for metastatic prostate cancer. PSA 0.5 in 11/2020 down from 1.3. He currently has an abscessed tooth. He is fatigued. NO issues urinating. Mild Hot flashes.    PMH: Past Medical History:  Diagnosis Date  . Abdominal aortic aneurysm (AAA) ([place])   . Anxiety   . COPD (chronic obstructive pulmonary disease) ([place])   . Coronary artery calcification seen on CT scan   . Essential hypertension   . Lung cancer ([place])    Non small cell carcinoma - XRT  . Prostate cancer ([place])    XRT  . Stroke ([place])   . Type 2 diabetes mellitus [facility])     Surgical History: Past Surgical History:  Procedure Laterality Date  . COLONOSCOPY N/A 11/23/2015   Procedure: COLONOSCOPY;  Surgeon: Aviva Signs, MD;  Location: [facility];  Service: Gastroenterology;  Laterality: N/A;  . FRACTURE SURGERY    . PROSTATE BIOPSY    . SINUS SURGERY WITH INSTATRAK      Home Medications:  Allergies as of 12/03/2020      Reactions   Sulfa Antibiotics Hives, Itching, Swelling   Tongue swelling   Sulfasalazine Hives, Itching, Swelling   Tongue swelling   Lipitor [atorvastatin] Other (See Comments)   myalgia   Simvastatin Other (See Comments)   myalgia      Medication List       Accurate as of December 03, 2020  1:30 PM. If you have any questions, ask your nurse or doctor.        albuterol (2.5 MG/3ML) 0.083% nebulizer solution Commonly known as: PROVENTIL Take 2.5 mg by nebulization every 6 (six) hours as needed.   alprazolam 2 MG tablet Commonly known as: XANAX Take 2 mg by mouth 2 (two) times daily.   calcium-vitamin D 500-200 MG-UNIT tablet Commonly known as: OSCAL WITH D Take 1 tablet by mouth.   cyclobenzaprine 10 MG tablet Commonly known as:  FLEXERIL Take 10 mg by mouth 3 (three) times daily as needed.   DULoxetine 30 MG capsule Commonly known as: CYMBALTA Take 30 mg by mouth daily.   ergocalciferol 1.25 MG (50000 UT) capsule Commonly known as: VITAMIN D2 Take 50,000 Units by mouth once a week.   glipiZIDE 5 MG tablet Commonly known as: GLUCOTROL TAKE 1 TABLET BY MOUTH TWICE DAILY BEFORE A MEAL   magnesium 30 MG tablet Take 400 mg by mouth daily.   milk thistle 175 MG tablet Take 175 mg by mouth daily.   rosuvastatin 5 MG tablet Commonly known as: CRESTOR Take 1 tablet (5 mg total) by mouth daily.   tamsulosin 0.4 MG Caps capsule Commonly known as: FLOMAX Take 1 capsule (0.4 mg total) by mouth in the morning and at bedtime.   VENTOLIN IN Inhale into the lungs every 8 (eight) hours as needed.   vitamin C 500 MG tablet Commonly known as: ASCORBIC ACID Take 500 mg by mouth daily.   vitamin E 1000 UNIT capsule Take 1,000 Units by mouth daily.   zinc gluconate 50 MG tablet Take 50 mg by mouth daily.       Allergies:  Allergies  Allergen Reactions  . Sulfa Antibiotics Hives, Itching and Swelling    Tongue swelling  . Sulfasalazine Hives, Itching and Swelling  Tongue swelling  . Lipitor [Atorvastatin] Other (See Comments)    myalgia  . Simvastatin Other (See Comments)    myalgia    Family History: Family History  Problem Relation Age of Onset  . Heart disease Father        before age 24  . Heart disease Mother   . Cancer Neg Hx     Social History:  reports that he has been smoking cigarettes. He has a 88.00 pack-year smoking history. He has never used smokeless tobacco. He reports that he does not drink alcohol and does not use drugs.  ROS: All other review of systems were reviewed and are negative except what is noted above in HPI  Physical Exam: BP 99/72   Pulse 94   Temp 97.6 F (36.4 C)   Ht 5\' 10"  (1.778 m)   Wt 164 lb (74.4 kg)   BMI 23.53 kg/m   Constitutional:  Alert and  oriented, No acute distress. HEENT: [place] AT, moist mucus membranes.  Trachea midline, no masses. Cardiovascular: No clubbing, cyanosis, or edema. Respiratory: Normal respiratory effort, no increased work of breathing. GI: Abdomen is soft, nontender, nondistended, no abdominal masses GU: No CVA tenderness.  Lymph: No cervical or inguinal lymphadenopathy. Skin: No rashes, bruises or suspicious lesions. Neurologic: Grossly intact, no focal deficits, moving all 4 extremities. Psychiatric: Normal mood and affect.  Laboratory Data: Lab Results  Component Value Date   WBC 10.5 05/15/2019   HGB 14.8 05/15/2019   HCT 46.4 05/15/2019   MCV 96.5 05/15/2019   PLT 263 05/15/2019    Lab Results  Component Value Date   CREATININE 1.13 05/19/2020    Lab Results  Component Value Date   PSA 9.7 (H) 05/19/2020   PSA 8.7 (H) 05/11/2020    Lab Results  Component Value Date   TESTOSTERONE 166 (L) 05/11/2020    Lab Results  Component Value Date   HGBA1C 7.5 (A) 10/06/2020    Urinalysis    Component Value Date/Time   APPEARANCEUR Clear 09/01/2020 1533   GLUCOSEU Negative 09/01/2020 1533   BILIRUBINUR Negative 09/01/2020 1533   PROTEINUR Negative 09/01/2020 1533   NITRITE Negative 09/01/2020 1533   LEUKOCYTESUR Negative 09/01/2020 1533    Lab Results  Component Value Date   LABMICR Comment 09/01/2020    Pertinent Imaging:  Results for orders placed during the hospital encounter of 05/14/08  DG Abd 1 View  Narrative Clinical Data: Left ureteral calculus.  ABDOMEN - 1 VIEW  Comparison: CT abdomen and pelvis 04/08/08.  Findings: No unexpected calcifications are seen over the renal shadows, expected course of the ureters or urinary bladder. Specifically, the small left UVJ stone seen the patient's CT scan is not visible on this study.  Bowel gas pattern is normal.  No focal bony abnormality.  IMPRESSION: Negative for plain film evidence of urinary tract  calculi.  Provider: Jennye Boroughs, Brooke Dare  No results found for this or any previous visit.  No results found for this or any previous visit.  No results found for this or any previous visit.  No results found for this or any previous visit.  No results found for this or any previous visit.  No results found for this or any previous visit.  No results found for this or any previous visit.   Assessment & Plan:    1. Prostate cancer metastatic to intrapelvic lymph node (HCC) -RTC 3 months with PSa and eligard 45mg   - Urinalysis, Routine w reflex  microscopic   No follow-ups on file.  Nicolette Bang, MD  [facility] [place]

## 2021-01-13 ENCOUNTER — Telehealth: Payer: Self-pay | Admitting: *Deleted

## 2021-01-13 DIAGNOSIS — Z79899 Other long term (current) drug therapy: Secondary | ICD-10-CM

## 2021-01-13 DIAGNOSIS — E782 Mixed hyperlipidemia: Secondary | ICD-10-CM

## 2021-01-13 DIAGNOSIS — I2510 Atherosclerotic heart disease of native coronary artery without angina pectoris: Secondary | ICD-10-CM

## 2021-01-13 NOTE — Telephone Encounter (Signed)
-----   Message from Merlene Laughter, RN sent at 09/03/2020  4:11 PM EDT ----- Regarding: will need FLP & LFT's in 12 weeks if able to tolerate crestor 5 mg Contact patient when this comes if not already had communicated about tolerating crestor-will need lab orders and arrange test if continue taking crestor

## 2021-01-13 NOTE — Telephone Encounter (Signed)
Reports tolerating crestor. Advised that fasting lab work can be done 2-3 days before his visit in March 2022. Says he will go to [employer] Dr. Kayleen Memos placed for [employer].

## 2021-01-31 DIAGNOSIS — Z79899 Other long term (current) drug therapy: Secondary | ICD-10-CM | POA: Diagnosis not present

## 2021-01-31 DIAGNOSIS — E559 Vitamin D deficiency, unspecified: Secondary | ICD-10-CM | POA: Diagnosis not present

## 2021-01-31 DIAGNOSIS — E782 Mixed hyperlipidemia: Secondary | ICD-10-CM | POA: Diagnosis not present

## 2021-01-31 DIAGNOSIS — E1159 Type 2 diabetes mellitus with other circulatory complications: Secondary | ICD-10-CM | POA: Diagnosis not present

## 2021-01-31 DIAGNOSIS — I2510 Atherosclerotic heart disease of native coronary artery without angina pectoris: Secondary | ICD-10-CM | POA: Diagnosis not present

## 2021-02-01 LAB — COMPREHENSIVE METABOLIC PANEL
ALT: 36 IU/L (ref 0–44)
AST: 29 IU/L (ref 0–40)
Albumin/Globulin Ratio: 1.8 (ref 1.2–2.2)
Albumin: 4.9 g/dL — ABNORMAL HIGH (ref 3.8–4.8)
Alkaline Phosphatase: 103 IU/L (ref 44–121)
BUN/Creatinine Ratio: 17 (ref 10–24)
BUN: 20 mg/dL (ref 8–27)
Bilirubin Total: 0.5 mg/dL (ref 0.0–1.2)
CO2: 22 mmol/L (ref 20–29)
Calcium: 9.7 mg/dL (ref 8.6–10.2)
Chloride: 99 mmol/L (ref 96–106)
Creatinine, Ser: 1.21 mg/dL (ref 0.76–1.27)
GFR calc Af Amer: 70 mL/min/{1.73_m2} (ref >59)
GFR calc non Af Amer: 61 mL/min/{1.73_m2} (ref >59)
Globulin, Total: 2.7 g/dL (ref 1.5–4.5)
Glucose: 165 mg/dL — ABNORMAL HIGH (ref 65–99)
Potassium: 4.8 mmol/L (ref 3.5–5.2)
Sodium: 140 mmol/L (ref 134–144)
Total Protein: 7.6 g/dL (ref 6.0–8.5)

## 2021-02-01 LAB — HEPATIC FUNCTION PANEL
ALT: 34 IU/L (ref 0–44)
AST: 30 IU/L (ref 0–40)
Albumin: 4.9 g/dL — ABNORMAL HIGH (ref 3.8–4.8)
Alkaline Phosphatase: 105 IU/L (ref 44–121)
Bilirubin Total: 0.5 mg/dL (ref 0.0–1.2)
Bilirubin, Direct: 0.15 mg/dL (ref 0.00–0.40)
Total Protein: 7.6 g/dL (ref 6.0–8.5)

## 2021-02-01 LAB — VITAMIN D 25 HYDROXY (VIT D DEFICIENCY, FRACTURES): Vit D, 25-Hydroxy: 41 ng/mL (ref 30.0–100.0)

## 2021-02-01 LAB — T4, FREE: Free T4: 1.31 ng/dL (ref 0.82–1.77)

## 2021-02-01 LAB — TSH: TSH: 1.83 u[IU]/mL (ref 0.450–4.500)

## 2021-02-01 LAB — LIPID PANEL
Chol/HDL Ratio: 3.8 ratio (ref 0.0–5.0)
Cholesterol, Total: 151 mg/dL (ref 100–199)
HDL: 40 mg/dL (ref >39)
LDL Chol Calc (NIH): 81 mg/dL (ref 0–99)
Triglycerides: 177 mg/dL — ABNORMAL HIGH (ref 0–149)
VLDL Cholesterol Cal: 30 mg/dL (ref 5–40)

## 2021-02-03 ENCOUNTER — Telehealth: Payer: Self-pay | Admitting: *Deleted

## 2021-02-03 NOTE — Telephone Encounter (Signed)
-----   Message from Satira Sark, MD sent at 02/01/2021  8:11 AM EST ----- Results reviewed.  He has had a very nice response to low-dose Crestor.  LDL came down from 174 all the way to 26 which is much closer to goal.  LFTs normal.  Continue same for now.

## 2021-02-03 NOTE — Telephone Encounter (Signed)
Patient informed. Copy sent to PCP °

## 2021-02-07 ENCOUNTER — Encounter: Payer: Medicare Other | Attending: "Endocrinology | Admitting: Nutrition

## 2021-02-07 ENCOUNTER — Encounter: Payer: Self-pay | Admitting: Nutrition

## 2021-02-07 ENCOUNTER — Encounter: Payer: Self-pay | Admitting: Nurse Practitioner

## 2021-02-07 ENCOUNTER — Other Ambulatory Visit: Payer: Self-pay

## 2021-02-07 ENCOUNTER — Ambulatory Visit (INDEPENDENT_AMBULATORY_CARE_PROVIDER_SITE_OTHER): Payer: Medicare Other | Admitting: Nurse Practitioner

## 2021-02-07 VITALS — Ht 70.0 in | Wt 167.0 lb

## 2021-02-07 VITALS — BP 113/80 | HR 106 | Ht 70.0 in | Wt 167.4 lb

## 2021-02-07 DIAGNOSIS — E1165 Type 2 diabetes mellitus with hyperglycemia: Secondary | ICD-10-CM | POA: Diagnosis not present

## 2021-02-07 DIAGNOSIS — IMO0002 Reserved for concepts with insufficient information to code with codable children: Secondary | ICD-10-CM

## 2021-02-07 DIAGNOSIS — C3492 Malignant neoplasm of unspecified part of left bronchus or lung: Secondary | ICD-10-CM | POA: Insufficient documentation

## 2021-02-07 DIAGNOSIS — E1159 Type 2 diabetes mellitus with other circulatory complications: Secondary | ICD-10-CM | POA: Diagnosis not present

## 2021-02-07 DIAGNOSIS — E559 Vitamin D deficiency, unspecified: Secondary | ICD-10-CM | POA: Diagnosis not present

## 2021-02-07 DIAGNOSIS — F17210 Nicotine dependence, cigarettes, uncomplicated: Secondary | ICD-10-CM

## 2021-02-07 DIAGNOSIS — E782 Mixed hyperlipidemia: Secondary | ICD-10-CM | POA: Diagnosis not present

## 2021-02-07 DIAGNOSIS — Z716 Tobacco abuse counseling: Secondary | ICD-10-CM

## 2021-02-07 DIAGNOSIS — N183 Chronic kidney disease, stage 3 unspecified: Secondary | ICD-10-CM | POA: Insufficient documentation

## 2021-02-07 DIAGNOSIS — E118 Type 2 diabetes mellitus with unspecified complications: Secondary | ICD-10-CM | POA: Diagnosis not present

## 2021-02-07 DIAGNOSIS — F17200 Nicotine dependence, unspecified, uncomplicated: Secondary | ICD-10-CM

## 2021-02-07 LAB — POCT GLYCOSYLATED HEMOGLOBIN (HGB A1C): HbA1c, POC (controlled diabetic range): 6.9 % (ref 0.0–7.0)

## 2021-02-07 LAB — POCT UA - MICROALBUMIN: Microalbumin Ur, POC: 10 mg/L

## 2021-02-07 NOTE — Progress Notes (Signed)
  Medical Nutrition Therapy:  Appt start time: 1330  end time: 1400 Assessment:  Primary concerns today: Diabetes Type 2. PMH: Hyperlipidemia, HTN.  A1C 6.9% dow from 7.4%. Improved. Saw Rayetta Pigg, FNP  today. Glipizide to 5 mg BID.     Still working on eating meals on time. Scheduled to get Lupron shot soon and it may effect his blood sugars. He has been working on trying meals on time and watching portions but still skips lunch most of the time. Eating more steamed vegetables more often. Feels good enough to start walking now. Started taking Vitamin D and feels better. FBS:117 mg.   Lab Results  Component Value Date   HGBA1C 6.9 02/07/2021    CMP Latest Ref Rng & Units 01/31/2021 01/31/2021 05/19/2020  Glucose 65 - 99 mg/dL - 165(H) 88  BUN 8 - 27 mg/dL - 20 14  Creatinine 0.76 - 1.27 mg/dL - 1.21 1.13  Sodium 134 - 144 mmol/L - 140 140  Potassium 3.5 - 5.2 mmol/L - 4.8 4.7  Chloride 96 - 106 mmol/L - 99 104  CO2 20 - 29 mmol/L - 22 28  Calcium 8.6 - 10.2 mg/dL - 9.7 9.8  Total Protein 6.0 - 8.5 g/dL 7.6 7.6 -  Total Bilirubin 0.0 - 1.2 mg/dL 0.5 0.5 -  Alkaline Phos 44 - 121 IU/L 105 103 -  AST 0 - 40 IU/L 30 29 -  ALT 0 - 44 IU/L 34 36 -   Lipid Panel     Component Value Date/Time   CHOL 151 01/31/2021 1145   TRIG 177 (H) 01/31/2021 1145   HDL 40 01/31/2021 1145   CHOLHDL 3.8 01/31/2021 1145   LDLCALC 81 01/31/2021 1145     Preferred Learning Style:   No preference indicated   Learning Readiness:  Change in progress  MEDICATIONS:   DIETARY INTAKE: 24-hr recall:  B ( AM):  Shredded wheat  Or eggs/sausage gravy, , water Snk ( AM):  L ( PM):  Slept through lunch, water Snk ( PM): D ( PM)  Fish,baked potatoes, water Snk ( PM):  Beverages: water  Usual physical activity: ADL  Estimated energy needs: 1800-200- calories 200  g carbohydrates 135 g protein 50 g fat  Progress Towards Goal(s):  In progress.   Nutritional Diagnosis:  NB-1.1 Food and  nutrition-related knowledge deficit As related to Diabetes and Hyperlipidemia.  As evidenced by A1C 7.3% and elevated lipd levels..    Intervention:  Nutrition and Diabetes education provided on My Plate, CHO counting, meal planning, portion sizes, timing of meals, avoiding snacks between meals unless having a low blood sugar, target ranges for A1C and blood sugars, signs/symptoms and treatment of hyper/hypoglycemia, monitoring blood sugars, taking medications as prescribed, benefits of exercising 30 minutes per day and prevention of complications of DM.  Goals  Make sure you eat three meals per day Do not skip meals Drink Glucerna or CIB for a meal if not eating. Eat meals on time Keep drinking water Prevent low blood sugars.s  .Teaching Method Utilized:  Visual Auditory Hands on  Handouts given during visit include:  The Plate Method   Meal Plan Card  Barriers to learning/adherence to lifestyle change: none  Demonstrated degree of understanding via:  Teach Back   Monitoring/Evaluation:  Dietary intake, exercise, meal planning , and body weight 3-4 months.Marland Kitchen

## 2021-02-07 NOTE — Patient Instructions (Signed)
Goals  Make sure you eat three meals per day Do not skip meals Drink Glucerna or CIB for a meal if not eating. Eat meals on time Keep drinking water Prevent low blood sugars.

## 2021-02-07 NOTE — Patient Instructions (Signed)

## 2021-02-07 NOTE — Progress Notes (Signed)
02/07/2021, 1:29 PM          Endocrinology follow-up note   Subjective:    Patient ID: Stephen Che., male    DOB: February 07, 1951.  Stephen Che. is being seen in follow up in the management of his currently uncontrolled symptomatic type 2 diabetes requested by  Scherrie Bateman   Past Medical History:  Diagnosis Date  . Abdominal aortic aneurysm (AAA) ([place])   . Anxiety   . COPD (chronic obstructive pulmonary disease) ([place])   . Coronary artery calcification seen on CT scan   . Essential hypertension   . Lung cancer ([place])    Non small cell carcinoma - XRT  . Prostate cancer ([place])    XRT  . Stroke ([place])   . Type 2 diabetes mellitus ([place])    Past Surgical History:  Procedure Laterality Date  . COLONOSCOPY N/A 11/23/2015   Procedure: COLONOSCOPY;  Surgeon: Aviva Signs, MD;  Location: [facility];  Service: Gastroenterology;  Laterality: N/A;  . FRACTURE SURGERY    . PROSTATE BIOPSY    . SINUS SURGERY WITH INSTATRAK     Social History   Socioeconomic History  . Marital status: Married    Spouse name: Not on file  . Number of children: Not on file  . Years of education: Not on file  . Highest education level: Not on file  Occupational History  . Not on file  Tobacco Use  . Smoking status: Current Every Day Smoker    Packs/day: 2.00    Years: 44.00    Pack years: 88.00    Types: Cigarettes  . Smokeless tobacco: Never Used  Vaping Use  . Vaping Use: Never used  Substance and Sexual Activity  . Alcohol use: No    Alcohol/week: 0.0 standard drinks  . Drug use: No  . Sexual activity: Not on file  Other Topics Concern  . Not on file  Social History Narrative  . Not on file   Social Determinants of Health   Financial Resource Strain: Not on file  Food Insecurity: Not on file  Transportation Needs: Not on file  Physical Activity: Not on file  Stress: Not on file  Social Connections: Not on file   Outpatient  Encounter Medications as of 02/07/2021  Medication Sig  . albuterol (PROVENTIL) (2.5 MG/3ML) 0.083% nebulizer solution Take 2.5 mg by nebulization every 6 (six) hours as needed.  Marland Kitchen alprazolam (XANAX) 2 MG tablet Take 2 mg by mouth 2 (two) times daily.  . calcium-vitamin D (OSCAL WITH D) 500-200 MG-UNIT tablet Take 1 tablet by mouth.  . cyclobenzaprine (FLEXERIL) 10 MG tablet Take 10 mg by mouth 3 (three) times daily as needed.   . DULoxetine (CYMBALTA) 30 MG capsule Take 30 mg by mouth daily.  . ergocalciferol (VITAMIN D2) 1.25 MG (50000 UT) capsule Take 50,000 Units by mouth once a week.  Marland Kitchen glipiZIDE (GLUCOTROL) 5 MG tablet TAKE 1 TABLET BY MOUTH TWICE DAILY BEFORE A MEAL  . magnesium 30 MG tablet Take 400 mg by mouth daily.  . milk thistle 175 MG tablet Take 175 mg by mouth daily.  . rosuvastatin (CRESTOR) 5 MG tablet Take 1 tablet (5 mg total) by mouth daily.  . tamsulosin (FLOMAX) 0.4 MG CAPS capsule Take 1 capsule (0.4 mg total) by mouth in the morning and at bedtime.  . vitamin C (ASCORBIC ACID) 500 MG tablet Take 500 mg by mouth  daily.  . vitamin E 1000 UNIT capsule Take 1,000 Units by mouth daily.  Marland Kitchen zinc gluconate 50 MG tablet Take 50 mg by mouth daily.  . [DISCONTINUED] Albuterol (VENTOLIN IN) Inhale into the lungs every 8 (eight) hours as needed.    No facility-administered encounter medications on file as of 02/07/2021.    ALLERGIES: Allergies  Allergen Reactions  . Sulfa Antibiotics Hives, Itching and Swelling    Tongue swelling  . Sulfasalazine Hives, Itching and Swelling    Tongue swelling  . Lipitor [Atorvastatin] Other (See Comments)    myalgia  . Simvastatin Other (See Comments)    myalgia    VACCINATION STATUS:  There is no immunization history on file for this patient.  Diabetes He presents for his follow-up diabetic visit. He has type 2 diabetes mellitus. Onset time: He was diagnosed at approximate age of 6 years. His disease course has been stable. There  are no hypoglycemic associated symptoms. Pertinent negatives for hypoglycemia include no confusion, headaches, pallor or seizures. Associated symptoms include blurred vision. Pertinent negatives for diabetes include no chest pain, no fatigue, no polydipsia, no polyphagia, no polyuria and no weakness. There are no hypoglycemic complications. Symptoms are stable. Diabetic complications include a CVA. Risk factors for coronary artery disease include diabetes mellitus, hypertension, male sex, tobacco exposure, sedentary lifestyle and dyslipidemia. Current diabetic treatment includes oral agent (monotherapy). He is compliant with treatment most of the time. His weight is increasing steadily. He is following a generally unhealthy diet. When asked about meal planning, he reported none. He has not had a previous visit with a dietitian. He never participates in exercise. His home blood glucose trend is decreasing steadily. His overall blood glucose range is 130-140 mg/dl. (He presents today with his meter and logs showing improved glycemic profile overall.  His POCT A1c today is 6.9%, improving from last visit of 7.5%.  He denies any hypoglycemia.) An ACE inhibitor/angiotensin II receptor blocker is not being taken. He does not see a podiatrist.Eye exam is current.  Hyperlipidemia This is a chronic problem. The current episode started more than 1 year ago. The problem is uncontrolled. Recent lipid tests were reviewed and are variable. Exacerbating diseases include chronic renal disease and diabetes. Factors aggravating his hyperlipidemia include smoking and fatty foods. Pertinent negatives include no chest pain. Current antihyperlipidemic treatment includes statins. The current treatment provides moderate improvement of lipids. Compliance problems include adherence to diet and adherence to exercise.  Risk factors for coronary artery disease include diabetes mellitus, dyslipidemia and male sex.   Review of  systems  Constitutional: + Minimally fluctuating body weight,  current Body mass index is 24.02 kg/m. , + fatigue, no subjective hyperthermia, no subjective hypothermia Eyes: no blurry vision, no xerophthalmia ENT: no sore throat, no nodules palpated in throat, no dysphagia/odynophagia, no hoarseness Cardiovascular: no chest pain, no shortness of breath, no palpitations, no leg swelling, (supposed to be having aortic valve stent placed sometime this year) Respiratory: no cough, no shortness of breath Gastrointestinal: no nausea/vomiting/diarrhea Musculoskeletal: no muscle/joint aches Skin: no rashes, no hyperemia Neurological: no tremors, no numbness, no tingling, no dizziness Psychiatric: no depression, no anxiety    Objective:    BP 113/80 (BP Location: Left Arm, Patient Position: Sitting)   Pulse (!) 106   Ht '5\' 10"'  (1.778 m)   Wt 167 lb 6.4 oz (75.9 kg)   BMI 24.02 kg/m   Wt Readings from Last 3 Encounters:  02/07/21 167 lb 6.4 oz (75.9 kg)  12/03/20 164 lb (74.4 kg)  12/01/20 164 lb (74.4 kg)    BP Readings from Last 3 Encounters:  02/07/21 113/80  12/03/20 99/72  12/01/20 129/85     Physical Exam- Limited  Constitutional:  Body mass index is 24.02 kg/m. , not in acute distress, normal state of mind Eyes:  EOMI, no exophthalmos Neck: Supple Cardiovascular: RRR, no murmers, rubs, or gallops, no edema Respiratory: Adequate breathing efforts, no crackles, rales, rhonchi, or wheezing Musculoskeletal: no gross deformities, strength intact in all four extremities, no gross restriction of joint movements Skin:  no rashes, no hyperemia, nicotinic discoloration to bilateral fingers with clubbing. Neurological: no tremor with outstretched hands    Recent Results (from the past 2160 hour(s))  PSA     Status: None   Collection Time: 11/25/20 12:00 PM  Result Value Ref Range   Prostate Specific Ag, Serum 0.5 0.0 - 4.0 ng/mL    Comment: Roche ECLIA  methodology. According to the American Urological Association, Serum PSA should decrease and remain at undetectable levels after radical prostatectomy. The AUA defines biochemical recurrence as an initial PSA value 0.2 ng/mL or greater followed by a subsequent confirmatory PSA value 0.2 ng/mL or greater. Values obtained with different assay methods or kits cannot be used interchangeably. Results cannot be interpreted as absolute evidence of the presence or absence of malignant disease.   Urinalysis, Routine w reflex microscopic     Status: None   Collection Time: 12/03/20  1:16 PM  Result Value Ref Range   Specific Gravity, UA 1.010 1.005 - 1.030   pH, UA 5.5 5.0 - 7.5   Color, UA Yellow Yellow   Appearance Ur Clear Clear   Leukocytes,UA Negative Negative   Protein,UA Negative Negative/Trace   Glucose, UA Negative Negative   Ketones, UA Negative Negative   RBC, UA Negative Negative   Bilirubin, UA Negative Negative   Urobilinogen, Ur 0.2 0.2 - 1.0 mg/dL   Nitrite, UA Negative Negative   Microscopic Examination Comment     Comment: Microscopic follows if indicated.  Comprehensive metabolic panel     Status: Abnormal   Collection Time: 01/31/21 11:44 AM  Result Value Ref Range   Glucose 165 (H) 65 - 99 mg/dL   BUN 20 8 - 27 mg/dL   Creatinine, Ser 1.21 0.76 - 1.27 mg/dL   GFR calc non Af Amer 61 >59 mL/min/1.73   GFR calc Af Amer 70 >59 mL/min/1.73    Comment: **In accordance with recommendations from the NKF-ASN Task force,**   [facility] is in the process of updating its eGFR calculation to the   2021 CKD-EPI creatinine equation that estimates kidney function   without a race variable.    BUN/Creatinine Ratio 17 10 - 24   Sodium 140 134 - 144 mmol/L   Potassium 4.8 3.5 - 5.2 mmol/L   Chloride 99 96 - 106 mmol/L   CO2 22 20 - 29 mmol/L   Calcium 9.7 8.6 - 10.2 mg/dL   Total Protein 7.6 6.0 - 8.5 g/dL   Albumin 4.9 (H) 3.8 - 4.8 g/dL   Globulin, Total 2.7 1.5 - 4.5 g/dL    Albumin/Globulin Ratio 1.8 1.2 - 2.2   Bilirubin Total 0.5 0.0 - 1.2 mg/dL   Alkaline Phosphatase 103 44 - 121 IU/L   AST 29 0 - 40 IU/L   ALT 36 0 - 44 IU/L  TSH     Status: None   Collection Time: 01/31/21 11:44 AM  Result Value Ref  Range   TSH 1.830 0.450 - 4.500 uIU/mL  T4, free     Status: None   Collection Time: 01/31/21 11:44 AM  Result Value Ref Range   Free T4 1.31 0.82 - 1.77 ng/dL  VITAMIN D 25 Hydroxy (Vit-D Deficiency, Fractures)     Status: None   Collection Time: 01/31/21 11:44 AM  Result Value Ref Range   Vit D, 25-Hydroxy 41.0 30.0 - 100.0 ng/mL    Comment: Vitamin D deficiency has been defined by the [place] practice guideline as a level of serum 25-OH vitamin D less than 20 ng/mL (1,2). The Endocrine Society went on to further define vitamin D insufficiency as a level between 21 and 29 ng/mL (2). 1. IOM (Institute of Medicine). 2010. Dietary reference    intakes for calcium and D. [place]: The    [employer]. 2. Holick MF, Binkley Andrew, Bischoff-Ferrari HA, et al.    Evaluation, treatment, and prevention of vitamin D    deficiency: an Endocrine Society clinical practice    guideline. JCEM. 2011 Jul; 96(7):1911-30.   Hepatic function panel     Status: Abnormal   Collection Time: 01/31/21 11:45 AM  Result Value Ref Range   Total Protein 7.6 6.0 - 8.5 g/dL   Albumin 4.9 (H) 3.8 - 4.8 g/dL   Bilirubin Total 0.5 0.0 - 1.2 mg/dL   Bilirubin, Direct 0.15 0.00 - 0.40 mg/dL   Alkaline Phosphatase 105 44 - 121 IU/L   AST 30 0 - 40 IU/L   ALT 34 0 - 44 IU/L  Lipid panel     Status: Abnormal   Collection Time: 01/31/21 11:45 AM  Result Value Ref Range   Cholesterol, Total 151 100 - 199 mg/dL   Triglycerides 177 (H) 0 - 149 mg/dL   HDL 40 >39 mg/dL   VLDL Cholesterol Cal 30 5 - 40 mg/dL   LDL Chol Calc (NIH) 81 0 - 99 mg/dL   Chol/HDL Ratio 3.8 0.0 - 5.0 ratio    Comment:                                    T. Chol/HDL Ratio                                             Men  Women                               1/2 Avg.Risk  3.4    3.3                                   Avg.Risk  5.0    4.4                                2X Avg.Risk  9.6    7.1                                3X Avg.Risk 23.4   11.0   HgB A1c  Status: Abnormal   Collection Time: 02/07/21  1:19 PM  Result Value Ref Range   Hemoglobin A1C     HbA1c POC (<> result, manual entry)     HbA1c, POC (prediabetic range)     HbA1c, POC (controlled diabetic range) 6.9 0.0 - 7.0 %  POCT UA - Microalbumin     Status: Normal   Collection Time: 02/07/21  1:19 PM  Result Value Ref Range   Microalbumin Ur, POC 10 mg/L   Creatinine, POC     Albumin/Creatinine Ratio, Urine, POC        Assessment & Plan:   1) DM type 2 causing vascular disease ([place])  - Stephen Che. has currently uncontrolled symptomatic type 2 DM since 70 years of age.  He presents today with his meter and logs showing improved glycemic profile overall.  His POCT A1c today is 6.9%, improving from last visit of 7.5%.  He denies any hypoglycemia.  Recent labs are reviewed with him.    -his diabetes is complicated by CVA , chronic heavy smoking, and he remains at extremely high risk for more acute and chronic complications which include CAD, CVA, CKD, retinopathy, and neuropathy. These are all discussed in detail with him.  - Nutritional counseling repeated at each appointment due to patients tendency to fall back in to old habits.  - The patient admits there is a room for improvement in their diet and drink choices. -  Suggestion is made for the patient to avoid simple carbohydrates from their diet including Cakes, Sweet Desserts / Pastries, Ice Cream, Soda (diet and regular), Sweet Tea, Candies, Chips, Cookies, Sweet Pastries,  Store Bought Juices, Alcohol in Excess of  1-2 drinks a day, Artificial Sweeteners, Coffee Creamer, and "Sugar-free" Products. This will  help patient to have stable blood glucose profile and potentially avoid unintended weight gain.   - I encouraged the patient to switch to  unprocessed or minimally processed complex starch and increased protein intake (animal or plant source), fruits, and vegetables.   - Patient is advised to stick to a routine mealtimes to eat 3 meals  a day and avoid unnecessary snacks ( to snack only to correct hypoglycemia).  - I have approached him with the following individualized plan to manage diabetes and patient agrees:   -Based on his near target glycemic presentation, he will do well to continue on his current medication regimen.  He is advised to continue Glipizide 5 mg po twice daily with meals.    -He is encouraged to monitor blood glucose at least twice daily, before breakfast and before bed, and call clinic if he has readings less than 70 or greater than 200 for 3 tests in a row.  -He is not a suitable candidate for metformin, SGLT2 inhibitors, nor incretin therapy.  - Patient specific target  A1c;  LDL, HDL, Triglycerides  were discussed in detail.  2) BP/HTN:  His blood pressure is controlled to target without the use of antihypertensive medications.   3) Lipids/HPL:  His most recent lipid panel from 01/31/21 shows controlled LDL of 81 and elevated triglycerides of 177.  He is advised to continue Crestor 5 mg po daily at bedtime.   4)  Weight/Diet:  His Body mass index is 24.02 kg/m.-weight loss is not advisable for him.  CDE Consult will be initiated . Exercise, and detailed carbohydrates information provided  -  detailed on discharge instructions.  5) Chronic Care/Health Maintenance: -he is on Statin medications and  is encouraged to initiate and continue to follow up with Ophthalmology, Dentist,  Podiatrist at least yearly or according to recommendations, and advised to Quit smoking. I have recommended yearly flu vaccine and pneumonia vaccine at least every 5 years; moderate intensity  exercise for up to 150 minutes weekly; and  sleep for at least 7 hours a day.  The patient was counseled on the dangers of tobacco use, and was advised to quit.  Reviewed strategies to maximize success, including removing cigarettes and smoking materials from environment.  He is not ready to quit based on our conversation today.    - I advised patient to maintain close follow up with [name], PA-C for primary care needs.   - Time spent on this patient care encounter:  40 min, of which > 50% was spent in  counseling and the rest reviewing his blood glucose logs , discussing his hypoglycemia and hyperglycemia episodes, reviewing his current and  previous labs / studies  ( including abstraction from other facilities) and medications  doses and developing a  long term treatment plan and documenting his care.   Please refer to Patient Instructions for Blood Glucose Monitoring and Insulin/Medications Dosing Guide"  in media tab for additional information. Please  also refer to " Patient Self Inventory" in the Media  tab for reviewed elements of pertinent patient history.  Stephen Che. participated in the discussions, expressed understanding, and voiced agreement with the above plans.  All questions were answered to his satisfaction. he is encouraged to contact clinic should he have any questions or concerns prior to his return visit.   Follow up plan: - Return in about 4 months (around 06/07/2021) for Diabetes follow up with A1c in office, No previsit labs, Bring glucometer and logs.  Rayetta Pigg, [facility] [address] [place], [place] [address] Phone: [phone] Fax: [phone]  02/07/2021, 1:29 PM

## 2021-02-22 DIAGNOSIS — L821 Other seborrheic keratosis: Secondary | ICD-10-CM | POA: Diagnosis not present

## 2021-02-22 DIAGNOSIS — D485 Neoplasm of uncertain behavior of skin: Secondary | ICD-10-CM | POA: Diagnosis not present

## 2021-02-22 DIAGNOSIS — Z85828 Personal history of other malignant neoplasm of skin: Secondary | ICD-10-CM | POA: Diagnosis not present

## 2021-02-22 DIAGNOSIS — L570 Actinic keratosis: Secondary | ICD-10-CM | POA: Diagnosis not present

## 2021-02-22 DIAGNOSIS — L820 Inflamed seborrheic keratosis: Secondary | ICD-10-CM | POA: Diagnosis not present

## 2021-02-25 ENCOUNTER — Other Ambulatory Visit: Payer: Medicare Other

## 2021-02-25 ENCOUNTER — Other Ambulatory Visit: Payer: Self-pay

## 2021-02-25 DIAGNOSIS — C775 Secondary and unspecified malignant neoplasm of intrapelvic lymph nodes: Secondary | ICD-10-CM

## 2021-02-25 DIAGNOSIS — C61 Malignant neoplasm of prostate: Secondary | ICD-10-CM | POA: Diagnosis not present

## 2021-02-26 LAB — PSA: Prostate Specific Ag, Serum: 0.4 ng/mL (ref 0.0–4.0)

## 2021-03-04 ENCOUNTER — Other Ambulatory Visit: Payer: Self-pay

## 2021-03-04 ENCOUNTER — Ambulatory Visit (INDEPENDENT_AMBULATORY_CARE_PROVIDER_SITE_OTHER): Payer: Medicare Other | Admitting: Urology

## 2021-03-04 ENCOUNTER — Encounter: Payer: Self-pay | Admitting: Urology

## 2021-03-04 VITALS — BP 99/63 | HR 97 | Temp 97.6°F | Ht 70.0 in | Wt 165.0 lb

## 2021-03-04 DIAGNOSIS — C61 Malignant neoplasm of prostate: Secondary | ICD-10-CM | POA: Diagnosis not present

## 2021-03-04 DIAGNOSIS — I2510 Atherosclerotic heart disease of native coronary artery without angina pectoris: Secondary | ICD-10-CM | POA: Diagnosis not present

## 2021-03-04 DIAGNOSIS — N401 Enlarged prostate with lower urinary tract symptoms: Secondary | ICD-10-CM

## 2021-03-04 DIAGNOSIS — N138 Other obstructive and reflux uropathy: Secondary | ICD-10-CM

## 2021-03-04 DIAGNOSIS — R351 Nocturia: Secondary | ICD-10-CM | POA: Diagnosis not present

## 2021-03-04 DIAGNOSIS — C775 Secondary and unspecified malignant neoplasm of intrapelvic lymph nodes: Secondary | ICD-10-CM | POA: Diagnosis not present

## 2021-03-04 LAB — URINALYSIS, ROUTINE W REFLEX MICROSCOPIC
Bilirubin, UA: NEGATIVE
Glucose, UA: NEGATIVE
Ketones, UA: NEGATIVE
Leukocytes,UA: NEGATIVE
Nitrite, UA: NEGATIVE
Protein,UA: NEGATIVE
RBC, UA: NEGATIVE
Specific Gravity, UA: 1.01 (ref 1.005–1.030)
Urobilinogen, Ur: 0.2 mg/dL (ref 0.2–1.0)
pH, UA: 6 (ref 5.0–7.5)

## 2021-03-04 MED ORDER — TAMSULOSIN HCL 0.4 MG PO CAPS: 0.4000 mg | ORAL_CAPSULE | Freq: Two times a day (BID) | ORAL | 11 refills | Status: DC

## 2021-03-04 MED ORDER — LEUPROLIDE ACETATE (6 MONTH) 45 MG ~~LOC~~ KIT
45.0000 mg | PACK | Freq: Once | SUBCUTANEOUS | Status: AC
  Administered 2021-03-04: 45 mg via SUBCUTANEOUS

## 2021-03-04 NOTE — Progress Notes (Signed)
03/04/2021 1:16 PM   Stephen Palmer. [birth year] [id number]  Referring provider: [name], PA-C [address] [place],  [place] [address]  Followup prostate cancer  HPI: Stephen Palmer is a 70yo here for followup for metastatic prostate cancer. PSA decreased to 0.4 from 0.5 on ADT. Mild hot flashes which are not bothersome. No issues urinating. IPSS 3 QOL 0. Nocturia 1x. He is on flomax 0.4mg  BID. He is having an endovascular stent for AAA.  He has fatigue on ADT   PMH: Past Medical History:  Diagnosis Date  . Abdominal aortic aneurysm (AAA) ([place])   . Anxiety   . COPD (chronic obstructive pulmonary disease) ([place])   . Coronary artery calcification seen on CT scan   . Essential hypertension   . Lung cancer ([place])    Non small cell carcinoma - XRT  . Prostate cancer ([place])    XRT  . Stroke ([place])   . Type 2 diabetes mellitus [facility])     Surgical History: Past Surgical History:  Procedure Laterality Date  . COLONOSCOPY N/A 11/23/2015   Procedure: COLONOSCOPY;  Surgeon: Aviva Signs, MD;  Location[facility];  Service: Gastroenterology;  Laterality: N/A;  . FRACTURE SURGERY    . PROSTATE BIOPSY    . SINUS SURGERY WITH INSTATRAK      Home Medications:  Allergies as of 03/04/2021      Reactions   Sulfa Antibiotics Hives, Itching, Swelling   Tongue swelling   Sulfasalazine Hives, Itching, Swelling   Tongue swelling   Lipitor [atorvastatin] Other (See Comments)   myalgia   Simvastatin Other (See Comments)   myalgia      Medication List       Accurate as of March 04, 2021  1:16 PM. If you have any questions, ask your nurse or doctor.        albuterol (2.5 MG/3ML) 0.083% nebulizer solution Commonly known as: PROVENTIL Take 2.5 mg by nebulization every 6 (six) hours as needed.   alprazolam 2 MG tablet Commonly known as: XANAX Take 2 mg by mouth 2 (two) times daily.   calcium-vitamin D 500-200 MG-UNIT tablet Commonly known as: OSCAL WITH D Take 1  tablet by mouth.   cyclobenzaprine 10 MG tablet Commonly known as: FLEXERIL Take 10 mg by mouth 3 (three) times daily as needed.   DULoxetine 30 MG capsule Commonly known as: CYMBALTA Take 30 mg by mouth daily.   ergocalciferol 1.25 MG (50000 UT) capsule Commonly known as: VITAMIN D2 Take 50,000 Units by mouth once a week.   glipiZIDE 5 MG tablet Commonly known as: GLUCOTROL TAKE 1 TABLET BY MOUTH TWICE DAILY BEFORE A MEAL   magnesium 30 MG tablet Take 400 mg by mouth daily.   milk thistle 175 MG tablet Take 175 mg by mouth daily.   rosuvastatin 5 MG tablet Commonly known as: CRESTOR Take 1 tablet (5 mg total) by mouth daily.   tamsulosin 0.4 MG Caps capsule Commonly known as: FLOMAX Take 1 capsule (0.4 mg total) by mouth in the morning and at bedtime.   vitamin C 500 MG tablet Commonly known as: ASCORBIC ACID Take 500 mg by mouth daily.   vitamin E 1000 UNIT capsule Take 1,000 Units by mouth daily.   zinc gluconate 50 MG tablet Take 50 mg by mouth daily.       Allergies:  Allergies  Allergen Reactions  . Sulfa Antibiotics Hives, Itching and Swelling    Tongue swelling  . Sulfasalazine Hives,  Itching and Swelling    Tongue swelling  . Lipitor [Atorvastatin] Other (See Comments)    myalgia  . Simvastatin Other (See Comments)    myalgia    Family History: Family History  Problem Relation Age of Onset  . Heart disease Father        before age 27  . Heart disease Mother   . Cancer Neg Hx     Social History:  reports that he has been smoking cigarettes. He has a 88.00 pack-year smoking history. He has never used smokeless tobacco. He reports that he does not drink alcohol and does not use drugs.  ROS: All other review of systems were reviewed and are negative except what is noted above in HPI  Physical Exam: BP 99/63   Pulse 97   Temp 97.6 F (36.4 C)   Ht 5\' 10"  (1.778 m)   Wt 165 lb (74.8 kg)   BMI 23.68 kg/m   Constitutional:  Alert and  oriented, No acute distress. HEENT: [place] AT, moist mucus membranes.  Trachea midline, no masses. Cardiovascular: No clubbing, cyanosis, or edema. Respiratory: Normal respiratory effort, no increased work of breathing. GI: Abdomen is soft, nontender, nondistended, no abdominal masses GU: No CVA tenderness.  Lymph: No cervical or inguinal lymphadenopathy. Skin: No rashes, bruises or suspicious lesions. Neurologic: Grossly intact, no focal deficits, moving all 4 extremities. Psychiatric: Normal mood and affect.  Laboratory Data: Lab Results  Component Value Date   WBC 10.5 05/15/2019   HGB 14.8 05/15/2019   HCT 46.4 05/15/2019   MCV 96.5 05/15/2019   PLT 263 05/15/2019    Lab Results  Component Value Date   CREATININE 1.21 01/31/2021    Lab Results  Component Value Date   PSA 9.7 (H) 05/19/2020   PSA 8.7 (H) 05/11/2020    Lab Results  Component Value Date   TESTOSTERONE 166 (L) 05/11/2020    Lab Results  Component Value Date   HGBA1C 6.9 02/07/2021    Urinalysis    Component Value Date/Time   APPEARANCEUR Clear 12/03/2020 1316   GLUCOSEU Negative 12/03/2020 1316   BILIRUBINUR Negative 12/03/2020 1316   PROTEINUR Negative 12/03/2020 1316   NITRITE Negative 12/03/2020 1316   LEUKOCYTESUR Negative 12/03/2020 1316    Lab Results  Component Value Date   LABMICR Comment 12/03/2020    Pertinent Imaging:  Results for orders placed during the hospital encounter of 05/14/08  DG Abd 1 View  Narrative Clinical Data: Left ureteral calculus.  ABDOMEN - 1 VIEW  Comparison: CT abdomen and pelvis 04/08/08.  Findings: No unexpected calcifications are seen over the renal shadows, expected course of the ureters or urinary bladder. Specifically, the small left UVJ stone seen the patient's CT scan is not visible on this study.  Bowel gas pattern is normal.  No focal bony abnormality.  IMPRESSION: Negative for plain film evidence of urinary tract  calculi.  Provider: Jennye Boroughs, Brooke Dare  No results found for this or any previous visit.  No results found for this or any previous visit.  No results found for this or any previous visit.  No results found for this or any previous visit.  No results found for this or any previous visit.  No results found for this or any previous visit.  No results found for this or any previous visit.   Assessment & Plan:    1. Prostate cancer metastatic to intrapelvic lymph node ([place]) -RTC 6 months with PSA -Eligard 45mg  today -  Urinalysis, Routine w reflex microscopic  2. BPH with LUTS, nocturia -continue flomax 0.4mg  BID   Return in about 6 months (around 09/04/2021).  Nicolette Bang, MD  [facility] [place]

## 2021-03-04 NOTE — Patient Instructions (Signed)
Prostate Cancer  The prostate is a male gland that helps make semen. It is located below a man's bladder, in front of the rectum. Prostate cancer is when abnormal cells grow in this gland. What are the causes? The cause of this condition is not known. What increases the risk? You are more likely to develop this condition if:  You are 70 years of age or older.  You are African American.  You have a family history of prostate cancer.  You have a family history of breast cancer. What are the signs or symptoms? Symptoms of this condition include:  A need to pee often.  Peeing that is weak, or pee that stops and starts.  Trouble starting or stopping your pee.  Inability to pee.  Blood in your pee or semen.  Pain in the lower back, lower belly (abdomen), hips, or upper thighs.  Trouble getting an erection.  Trouble emptying all of your pee. How is this treated? Treatment for this condition depends on your age, your health, the kind of treatment you like, and how far the cancer has spread. Treatments include:  Being watched. This is called observation. You will be tested from time to time, but you will not get treated. Tests are to make sure that the cancer is not growing.  Surgery. This may be done to remove the prostate, to remove the testicles, or to freeze or kill cancer cells.  Radiation. This uses a strong beam to kill cancer cells.  Ultrasound energy. This uses strong sound waves to kill cancer cells.  Chemotherapy. This uses medicines that stop cancer cells from increasing. This kills cancer cells and healthy cells.  Targeted therapy. This kills cancer cells only. Healthy cells are not affected.  Hormone treatment. This stops the body from making hormones that help the cancer cells to grow. Follow these instructions at home:  Take over-the-counter and prescription medicines only as told by your doctor.  Eat a healthy diet.  Get plenty of sleep.  Ask your  doctor for help to find a support group for men with prostate cancer.  If you have to go to the hospital, let your cancer doctor (oncologist) know.  Treatment may affect your ability to have sex. Touch, hold, hug, and caress your partner to have intimate moments.  Keep all follow-up visits as told by your doctor. This is important. Contact a doctor if:  You have new or more trouble peeing.  You have new or more blood in your pee.  You have new or more pain in your hips, back, or chest. Get help right away if:  You have weakness in your legs.  You lose feeling in your legs.  You cannot control your pee or your poop (stool).  You have chills or a fever. Summary  The prostate is a male gland that helps make semen.  Prostate cancer is when abnormal cells grow in this gland.  Treatment includes doing surgery, using medicines, using very strong beams, or watching without treatment.  Ask your doctor for help to find a support group for men with prostate cancer.  Contact a doctor if you have problems peeing or have any new pain that you did not have before. This information is not intended to replace advice given to you by your health care provider. Make sure you discuss any questions you have with your health care provider. Document Revised: 11/18/2019 Document Reviewed: 11/18/2019 Elsevier Patient Education  2021 Elsevier Inc.  

## 2021-03-04 NOTE — Progress Notes (Signed)
Urological Symptom Review  Patient is experiencing the following symptoms: Get up at night to urinate Leakage of urine Erection problems (male only)   Review of Systems  Gastrointestinal (upper)  : Negative for upper GI symptoms  Gastrointestinal (lower) : Negative for lower GI symptoms  Constitutional : Fatigue  Skin: Itching  Eyes: Blurred vision  Ear/Nose/Throat : Negative for Ear/Nose/Throat symptoms  Hematologic/Lymphatic: Negative for Hematologic/Lymphatic symptoms  Cardiovascular : Negative for cardiovascular symptoms  Respiratory : Negative for respiratory symptoms  Endocrine: Negative for endocrine symptoms  Musculoskeletal: Back pain  Neurological: Dizziness  Psychologic: Negative for psychiatric symptoms

## 2021-03-04 NOTE — Progress Notes (Signed)
Eligard SubQ Injection   Due to Prostate Cancer patient is present today for a Eligard Injection.  Medication: Eligard 6 month Dose: 45 mg  Location: left  Lot: [id number] Exp: 06/17/22  Patient tolerated well, no complications were noted  Performed by: Antionette Char, Debra,LPN

## 2021-03-08 ENCOUNTER — Encounter: Payer: Self-pay | Admitting: Cardiology

## 2021-03-08 ENCOUNTER — Ambulatory Visit (INDEPENDENT_AMBULATORY_CARE_PROVIDER_SITE_OTHER): Payer: Medicare Other | Admitting: Cardiology

## 2021-03-08 VITALS — BP 98/64 | HR 90 | Ht 70.0 in | Wt 164.0 lb

## 2021-03-08 DIAGNOSIS — I714 Abdominal aortic aneurysm, without rupture, unspecified: Secondary | ICD-10-CM

## 2021-03-08 DIAGNOSIS — E782 Mixed hyperlipidemia: Secondary | ICD-10-CM | POA: Diagnosis not present

## 2021-03-08 DIAGNOSIS — I2510 Atherosclerotic heart disease of native coronary artery without angina pectoris: Secondary | ICD-10-CM

## 2021-03-08 NOTE — Progress Notes (Signed)
Cardiology Office Note  Date: 03/08/2021   ID: Stephen Palmer., DOB 04-22-1951, MRN [id number]  PCP:  [name], PA-C  Cardiologist:  Rozann Lesches, MD Electrophysiologist:  None   Chief Complaint  Patient presents with  . Cardiac follow-up    History of Present Illness: Stephen Palmer. is a 70 y.o. male last seen in September 2021.  He presents for a routine follow-up visit.  Reports no angina symptoms or increasing shortness of breath of her baseline.  We did initiate Crestor following his last visit, LDL came down from 174 to 81.  He states that he has tolerated the medication well.  He had a follow-up visit with Dr. Scot Dock in December 2021, pararenal abdominal aortic aneurysm measuring 5.6 cm, not a candidate for endovascular repair.  Reimaging is planned via CTA in the next few months.  He will likely be referred to [facility] for further evaluation.  I reviewed his medications which are outlined below.  Past Medical History:  Diagnosis Date  . Abdominal aortic aneurysm (AAA) ([place])   . Anxiety   . COPD (chronic obstructive pulmonary disease) ([place])   . Coronary artery calcification seen on CT scan   . Essential hypertension   . Lung cancer ([place])    Non small cell carcinoma - XRT  . Prostate cancer ([place])    XRT  . Stroke ([place])   . Type 2 diabetes mellitus ([place])     Past Surgical History:  Procedure Laterality Date  . COLONOSCOPY N/A 11/23/2015   Procedure: COLONOSCOPY;  Surgeon: Aviva Signs, MD;  Location: [facility];  Service: Gastroenterology;  Laterality: N/A;  . FRACTURE SURGERY    . PROSTATE BIOPSY    . SINUS SURGERY WITH INSTATRAK      Current Outpatient Medications  Medication Sig Dispense Refill  . albuterol (PROVENTIL) (2.5 MG/3ML) 0.083% nebulizer solution Take 2.5 mg by nebulization every 6 (six) hours as needed.    Marland Kitchen alprazolam (XANAX) 2 MG tablet Take 2 mg by mouth 2 (two) times daily.    . calcium-vitamin D (OSCAL WITH  D) 500-200 MG-UNIT tablet Take 1 tablet by mouth.    . cyclobenzaprine (FLEXERIL) 10 MG tablet Take 10 mg by mouth 3 (three) times daily as needed.     . DULoxetine (CYMBALTA) 30 MG capsule Take 30 mg by mouth daily.    . ergocalciferol (VITAMIN D2) 1.25 MG (50000 UT) capsule Take 50,000 Units by mouth once a week.    Marland Kitchen glipiZIDE (GLUCOTROL) 5 MG tablet TAKE 1 TABLET BY MOUTH TWICE DAILY BEFORE A MEAL 180 tablet 1  . magnesium 30 MG tablet Take 400 mg by mouth daily.    . milk thistle 175 MG tablet Take 175 mg by mouth daily.    . rosuvastatin (CRESTOR) 5 MG tablet Take 5 mg by mouth daily.    . tamsulosin (FLOMAX) 0.4 MG CAPS capsule Take 1 capsule (0.4 mg total) by mouth in the morning and at bedtime. 60 capsule 11  . vitamin C (ASCORBIC ACID) 500 MG tablet Take 500 mg by mouth daily.    . vitamin E 1000 UNIT capsule Take 1,000 Units by mouth daily.    Marland Kitchen zinc gluconate 50 MG tablet Take 50 mg by mouth daily.     No current facility-administered medications for this visit.   Allergies:  Sulfa antibiotics, Sulfasalazine, Lipitor [atorvastatin], and Simvastatin   ROS: No palpitations or syncope.  Physical Exam: VS:  BP  98/64   Pulse 90   Ht 5\' 10"  (1.778 m)   Wt 164 lb (74.4 kg)   SpO2 96%   BMI 23.53 kg/m , BMI Body mass index is 23.53 kg/m.  Wt Readings from Last 3 Encounters:  03/08/21 164 lb (74.4 kg)  03/04/21 165 lb (74.8 kg)  02/07/21 167 lb (75.8 kg)    General: Patient appears comfortable at rest. HEENT: Conjunctiva and lids normal, wearing a mask. Neck: Supple, no elevated JVP or carotid bruits, no thyromegaly. Lungs: Clear to auscultation, nonlabored breathing at rest. Cardiac: Regular rate and rhythm, no S3, soft systolic murmur, no pericardial rub. Extremities: No pitting edema.  ECG:  An ECG dated 09/02/2020 was personally reviewed today and demonstrated:  Sinus rhythm with possible left atrial enlargement.  Recent Labwork: 01/31/2021: ALT 34; AST 30; BUN 20;  Creatinine, Ser 1.21; Potassium 4.8; Sodium 140; TSH 1.830     Component Value Date/Time   CHOL 151 01/31/2021 1145   TRIG 177 (H) 01/31/2021 1145   HDL 40 01/31/2021 1145   CHOLHDL 3.8 01/31/2021 1145   [place] 81 01/31/2021 1145    Other Studies Reviewed Today:  Lexiscan Myoview 08/22/2018:  Normal perfusion. No significant ischemia or scar  This is a low risk study.  Nuclear stress EF: 70%.  CT chest, abdomen, and pelvis 04/12/2020: IMPRESSION: 1. Stable dilatation of aortic arch and descending thoracic aorta up to 3.2 cm. Recommend annual imaging followup by CTA or MRA. This recommendation follows 2010 ACCF/AHA/AATS/ACR/ASA/SCA/SCAI/SIR/STS/SVM Guidelines for the Diagnosis and Management of Patients with Thoracic Aortic Disease. Circulation.2010; 121: P594-V859 2. 5.3 cm infrarenal abdominal aortic aneurysm (previously 5.2) without complicating features. Recommend followup by abdomen and pelvis CTA in 3-6 months, and vascular surgery referral/consultation if not already obtained. This recommendation follows ACR consensus guidelines: White Paper of the ACR Incidental Findings Committee II on Vascular Findings. J Am Coll Radiol 2013; 10:789-794. 3. 0.9 cm left upper lobe pulmonary nodule (previously 0.7), with increasing linear opacities extending towards the pleural laterally and anteriorly. PET-CT may be useful to differentiate residual/recurrent neoplasm from scarring/atelectasis. 4. 1.5 cm enlarged right external iliac lymph node, new since previous. Correlate with PSA. Follow-up recommended.  Aortic Atherosclerosis (ICD10-I70.0) and Emphysema (ICD10-J43.9).  Abdominal ultrasound 12/01/2020: Summary:  Abdominal Aorta: There is evidence of abnormal dilatation of the mid and  distal Abdominal aorta. Previous diameter measurement was 5.3 x 5.1 cm  obtained on 04/12/20 by CTA.   Assessment and Plan:  1.  Mixed hyperlipidemia, now tolerating Crestor with LDL showing  significant improvement from 174 down to 81.  Continue with current regimen for now.  2.  Abdominal aortic aneurysm, continues to follow regularly with Dr. Doren Custard.  Most recent measurement 5.6 cm.  He also has dilatation of the aortic arch and descending thoracic aorta.  Medication Adjustments/Labs and Tests Ordered: Current medicines are reviewed at length with the patient today.  Concerns regarding medicines are outlined above.   Tests Ordered: No orders of the defined types were placed in this encounter.   Medication Changes: No orders of the defined types were placed in this encounter.   Disposition:  Follow up 6 months in the [place] office.  Signed, Satira Sark, MD, [facility] 03/08/2021 1:24 PM    [facility] [place], [place], [place] [address] Phone: [phone]; Fax: [phone]

## 2021-03-08 NOTE — Patient Instructions (Signed)

## 2021-04-12 DIAGNOSIS — Z6823 Body mass index (BMI) 23.0-23.9, adult: Secondary | ICD-10-CM | POA: Diagnosis not present

## 2021-04-12 DIAGNOSIS — C61 Malignant neoplasm of prostate: Secondary | ICD-10-CM | POA: Diagnosis not present

## 2021-04-12 DIAGNOSIS — F22 Delusional disorders: Secondary | ICD-10-CM | POA: Diagnosis not present

## 2021-04-12 DIAGNOSIS — F17200 Nicotine dependence, unspecified, uncomplicated: Secondary | ICD-10-CM | POA: Diagnosis not present

## 2021-04-12 DIAGNOSIS — Z1331 Encounter for screening for depression: Secondary | ICD-10-CM | POA: Diagnosis not present

## 2021-04-12 DIAGNOSIS — F419 Anxiety disorder, unspecified: Secondary | ICD-10-CM | POA: Diagnosis not present

## 2021-04-20 ENCOUNTER — Ambulatory Visit: Payer: Medicare Other | Admitting: Vascular Surgery

## 2021-04-20 DIAGNOSIS — E119 Type 2 diabetes mellitus without complications: Secondary | ICD-10-CM | POA: Diagnosis not present

## 2021-04-20 DIAGNOSIS — I77810 Thoracic aortic ectasia: Secondary | ICD-10-CM | POA: Diagnosis not present

## 2021-04-20 DIAGNOSIS — Q272 Other congenital malformations of renal artery: Secondary | ICD-10-CM | POA: Diagnosis not present

## 2021-04-20 DIAGNOSIS — J439 Emphysema, unspecified: Secondary | ICD-10-CM | POA: Diagnosis not present

## 2021-04-20 DIAGNOSIS — I10 Essential (primary) hypertension: Secondary | ICD-10-CM | POA: Diagnosis not present

## 2021-04-20 DIAGNOSIS — Z7984 Long term (current) use of oral hypoglycemic drugs: Secondary | ICD-10-CM | POA: Diagnosis not present

## 2021-04-20 DIAGNOSIS — I701 Atherosclerosis of renal artery: Secondary | ICD-10-CM | POA: Diagnosis not present

## 2021-04-20 DIAGNOSIS — Z720 Tobacco use: Secondary | ICD-10-CM | POA: Diagnosis not present

## 2021-04-20 DIAGNOSIS — F17210 Nicotine dependence, cigarettes, uncomplicated: Secondary | ICD-10-CM | POA: Diagnosis not present

## 2021-04-20 DIAGNOSIS — I714 Abdominal aortic aneurysm, without rupture: Secondary | ICD-10-CM | POA: Diagnosis not present

## 2021-05-04 ENCOUNTER — Other Ambulatory Visit: Payer: Self-pay

## 2021-05-04 DIAGNOSIS — I712 Thoracic aortic aneurysm, without rupture, unspecified: Secondary | ICD-10-CM

## 2021-05-04 DIAGNOSIS — I714 Abdominal aortic aneurysm, without rupture, unspecified: Secondary | ICD-10-CM

## 2021-05-05 ENCOUNTER — Encounter: Payer: Self-pay | Admitting: Urology

## 2021-05-05 ENCOUNTER — Other Ambulatory Visit: Payer: Self-pay | Admitting: *Deleted

## 2021-05-06 ENCOUNTER — Other Ambulatory Visit: Payer: Self-pay | Admitting: Urology

## 2021-05-11 ENCOUNTER — Telehealth: Payer: Self-pay | Admitting: Radiation Oncology

## 2021-05-11 NOTE — Telephone Encounter (Signed)
Phoned patient as requested by Freeman Caldron, PA-C. Phoned patient's home. Spoke with patient's wife, Jocelyn Lamer. Explained to Jocelyn Lamer that per Ashlyn her husband's scan done at [facility] appears stable with no new lesions or evidence of disease recurrence or progression. Explained that we will just plan to see him back in 6 months with CT Chest prior to that visit. Explained that If he has any questions for Ashlyn, she will be happy to call him but otherwise, we can cancel the telephone follow up visit and I will put in orders for CT/labs/follow up in 6 months.   Jocelyn Lamer verbalized understanding of all reviewed. She denied additional needs at this time or a need to hear from [place]. Jocelyn Lamer understands Ashlyn will phone to follow up with her husband in November and Enid Derry will call in October to arrange the CT chest.

## 2021-05-12 DIAGNOSIS — I714 Abdominal aortic aneurysm, without rupture: Secondary | ICD-10-CM | POA: Diagnosis not present

## 2021-05-12 DIAGNOSIS — F419 Anxiety disorder, unspecified: Secondary | ICD-10-CM | POA: Diagnosis not present

## 2021-05-12 DIAGNOSIS — Z6824 Body mass index (BMI) 24.0-24.9, adult: Secondary | ICD-10-CM | POA: Diagnosis not present

## 2021-05-12 DIAGNOSIS — E119 Type 2 diabetes mellitus without complications: Secondary | ICD-10-CM | POA: Diagnosis not present

## 2021-05-14 ENCOUNTER — Other Ambulatory Visit: Payer: Self-pay | Admitting: Nurse Practitioner

## 2021-05-25 ENCOUNTER — Ambulatory Visit: Payer: Self-pay | Admitting: Urology

## 2021-06-07 ENCOUNTER — Ambulatory Visit (INDEPENDENT_AMBULATORY_CARE_PROVIDER_SITE_OTHER): Payer: Medicare Other | Admitting: Nurse Practitioner

## 2021-06-07 ENCOUNTER — Other Ambulatory Visit: Payer: Self-pay

## 2021-06-07 ENCOUNTER — Encounter: Payer: Medicare Other | Attending: "Endocrinology | Admitting: Nutrition

## 2021-06-07 ENCOUNTER — Encounter: Payer: Self-pay | Admitting: Nurse Practitioner

## 2021-06-07 VITALS — BP 119/76 | HR 101 | Ht 70.0 in | Wt 164.0 lb

## 2021-06-07 DIAGNOSIS — E782 Mixed hyperlipidemia: Secondary | ICD-10-CM | POA: Insufficient documentation

## 2021-06-07 DIAGNOSIS — F17200 Nicotine dependence, unspecified, uncomplicated: Secondary | ICD-10-CM | POA: Diagnosis not present

## 2021-06-07 DIAGNOSIS — E1165 Type 2 diabetes mellitus with hyperglycemia: Secondary | ICD-10-CM | POA: Insufficient documentation

## 2021-06-07 DIAGNOSIS — E1159 Type 2 diabetes mellitus with other circulatory complications: Secondary | ICD-10-CM | POA: Diagnosis not present

## 2021-06-07 DIAGNOSIS — IMO0002 Reserved for concepts with insufficient information to code with codable children: Secondary | ICD-10-CM

## 2021-06-07 DIAGNOSIS — E118 Type 2 diabetes mellitus with unspecified complications: Secondary | ICD-10-CM | POA: Insufficient documentation

## 2021-06-07 DIAGNOSIS — E559 Vitamin D deficiency, unspecified: Secondary | ICD-10-CM

## 2021-06-07 DIAGNOSIS — N183 Chronic kidney disease, stage 3 unspecified: Secondary | ICD-10-CM | POA: Diagnosis not present

## 2021-06-07 LAB — POCT GLYCOSYLATED HEMOGLOBIN (HGB A1C): Hemoglobin A1C: 6.6 % — AB (ref 4.0–5.6)

## 2021-06-07 NOTE — Progress Notes (Signed)
  Medical Nutrition Therapy:  Appt start time: 1330  end time: 1400  Assessment:  Primary concerns today: Diabetes Type 2. PMH: Hyperlipidemia, HTN.  A1C 6.6, down from 6.9%. Still smoking and has no desire to quit. Notes he is always under a lot of stress. Still skipping meals at times. He notes he tries to take milk thistle at times to help his liver and his blood sugars.   Lab Results  Component Value Date   HGBA1C 6.6 (A) 06/07/2021    CMP Latest Ref Rng & Units 01/31/2021 01/31/2021 05/19/2020  Glucose 65 - 99 mg/dL - 165(H) 88  BUN 8 - 27 mg/dL - 20 14  Creatinine 0.76 - 1.27 mg/dL - 1.21 1.13  Sodium 134 - 144 mmol/L - 140 140  Potassium 3.5 - 5.2 mmol/L - 4.8 4.7  Chloride 96 - 106 mmol/L - 99 104  CO2 20 - 29 mmol/L - 22 28  Calcium 8.6 - 10.2 mg/dL - 9.7 9.8  Total Protein 6.0 - 8.5 g/dL 7.6 7.6 -  Total Bilirubin 0.0 - 1.2 mg/dL 0.5 0.5 -  Alkaline Phos 44 - 121 IU/L 105 103 -  AST 0 - 40 IU/L 30 29 -  ALT 0 - 44 IU/L 34 36 -   Lipid Panel     Component Value Date/Time   CHOL 151 01/31/2021 1145   TRIG 177 (H) 01/31/2021 1145   HDL 40 01/31/2021 1145   CHOLHDL 3.8 01/31/2021 1145   LDLCALC 81 01/31/2021 1145     Preferred Learning Style:  No preference indicated   Learning Readiness: Change in progress  MEDICATIONS:   DIETARY INTAKE: 24-hr recall:  B ( AM):  Shredded wheat  Or eggs/sausage gravy, , water Snk ( AM):  L ( PM):  skipped. D ( PM)   Hamburger stew, water Snk ( PM):  Beverages: water  Usual physical activity: ADL  Estimated energy needs: 1800-200- calories 200  g carbohydrates 135 g protein 50 g fat  Progress Towards Goal(s):  In progress.   Nutritional Diagnosis:  NB-1.1 Food and nutrition-related knowledge deficit As related to Diabetes and Hyperlipidemia.  As evidenced by A1C 7.3% and elevated lipd levels..    Intervention:  Nutrition and Diabetes education provided on My Plate, CHO counting, meal planning, portion sizes,  timing of meals, avoiding snacks between meals unless having a low blood sugar, target ranges for A1C and blood sugars, signs/symptoms and treatment of hyper/hypoglycemia, monitoring blood sugars, taking medications as prescribed, benefits of exercising 30 minutes per day and prevention of complications of DM.  Goals  Make sure you eat three meals per day Do not skip meals Drink Glucerna or CIB for a meal if not eating. Increase fresh fruits and vegetables. Keep drinking water Prevent low blood sugars.  Teaching Method Utilized:  Visual Auditory Hands on  Handouts given during visit include: The Plate Method  Meal Plan Card  Barriers to learning/adherence to lifestyle change: none  Demonstrated degree of understanding via:  Teach Back   Monitoring/Evaluation:  Dietary intake, exercise, meal planning , and body weight 3-4 months.Marland Kitchen

## 2021-06-07 NOTE — Progress Notes (Signed)
06/07/2021, 1:27 PM          Endocrinology follow-up note   Subjective:    Patient ID: Stephen Che., male    DOB: Nov 07, 1951.  Stephen Che. is being seen in follow up in the management of his currently uncontrolled symptomatic type 2 diabetes requested by  [name], PA-C   Past Medical History:  Diagnosis Date   Abdominal aortic aneurysm (AAA) ([place])    Anxiety    COPD (chronic obstructive pulmonary disease) ([place])    Coronary artery calcification seen on CT scan    Essential hypertension    Lung cancer ([place])    Non small cell carcinoma - XRT   Prostate cancer ([place])    XRT   Stroke ([place])    Type 2 diabetes mellitus ([place])    Past Surgical History:  Procedure Laterality Date   COLONOSCOPY N/A 11/23/2015   Procedure: COLONOSCOPY;  Surgeon: Aviva Signs, MD;  Location: [facility];  Service: Gastroenterology;  Laterality: N/A;   FRACTURE SURGERY     PROSTATE BIOPSY     SINUS SURGERY WITH INSTATRAK     Social History   Socioeconomic History   Marital status: Married    Spouse name: Not on file   Number of children: Not on file   Years of education: Not on file   Highest education level: Not on file  Occupational History   Not on file  Tobacco Use   Smoking status: Every Day    Packs/day: 2.00    Years: 44.00    Pack years: 88.00    Types: Cigarettes   Smokeless tobacco: Never  Vaping Use   Vaping Use: Never used  Substance and Sexual Activity   Alcohol use: No    Alcohol/week: 0.0 standard drinks   Drug use: No   Sexual activity: Not on file  Other Topics Concern   Not on file  Social History Narrative   Not on file   Social Determinants of Health   Financial Resource Strain: Not on file  Food Insecurity: Not on file  Transportation Needs: Not on file  Physical Activity: Not on file  Stress: Not on file  Social Connections: Not on file   Outpatient Encounter Medications as of 06/07/2021  Medication  Sig   Cholecalciferol (VITAMIN D) 50 MCG (2000 UT) CAPS Take by mouth daily.   albuterol (PROVENTIL) (2.5 MG/3ML) 0.083% nebulizer solution Take 2.5 mg by nebulization every 6 (six) hours as needed.   alprazolam (XANAX) 2 MG tablet Take 2 mg by mouth 2 (two) times daily.   calcium-vitamin D (OSCAL WITH D) 500-200 MG-UNIT tablet Take 1 tablet by mouth.   cyclobenzaprine (FLEXERIL) 10 MG tablet Take 10 mg by mouth 3 (three) times daily as needed.    DULoxetine (CYMBALTA) 30 MG capsule Take 30 mg by mouth daily.   glipiZIDE (GLUCOTROL) 5 MG tablet TAKE 1 TABLET BY MOUTH TWICE DAILY BEFORE A MEAL   milk thistle 175 MG tablet Take 175 mg by mouth daily.   rosuvastatin (CRESTOR) 5 MG tablet Take 5 mg by mouth daily.   tamsulosin (FLOMAX) 0.4 MG CAPS capsule Take 1 capsule (0.4 mg total) by mouth in the morning and at bedtime.   vitamin C (ASCORBIC ACID) 500 MG tablet Take 500 mg by mouth daily.   vitamin E 1000 UNIT capsule Take 1,000 Units by mouth daily.   zinc gluconate 50 MG tablet Take  50 mg by mouth daily.   [DISCONTINUED] ergocalciferol (VITAMIN D2) 1.25 MG (50000 UT) capsule Take 50,000 Units by mouth once a week.   [DISCONTINUED] magnesium 30 MG tablet Take 400 mg by mouth daily.   No facility-administered encounter medications on file as of 06/07/2021.    ALLERGIES: Allergies  Allergen Reactions   Sulfa Antibiotics Hives, Itching and Swelling    Tongue swelling   Sulfasalazine Hives, Itching and Swelling    Tongue swelling   Lipitor [Atorvastatin] Other (See Comments)    myalgia   Simvastatin Other (See Comments)    myalgia    VACCINATION STATUS:  There is no immunization history on file for this patient.  Diabetes He presents for his follow-up diabetic visit. He has type 2 diabetes mellitus. Onset time: He was diagnosed at approximate age of 70 years. His disease course has been improving. There are no hypoglycemic associated symptoms. Pertinent negatives for hypoglycemia  include no confusion, headaches, pallor or seizures. Associated symptoms include blurred vision. Pertinent negatives for diabetes include no chest pain, no fatigue, no polydipsia, no polyphagia, no polyuria and no weakness. There are no hypoglycemic complications. Symptoms are stable. Diabetic complications include a CVA and nephropathy. Risk factors for coronary artery disease include diabetes mellitus, hypertension, male sex, tobacco exposure, sedentary lifestyle and dyslipidemia. Current diabetic treatment includes oral agent (monotherapy). He is compliant with treatment most of the time. His weight is fluctuating minimally. He is following a generally unhealthy diet. When asked about meal planning, he reported none. He has not had a previous visit with a dietitian. He never participates in exercise. His home blood glucose trend is decreasing steadily. His breakfast blood glucose range is generally 90-110 mg/dl. His overall blood glucose range is 110-130 mg/dl. (He presents today with his meter and logs showing at goal fasting and tight postprandial glycemic profile.  He reports his evening glucose is low because of his meal timing in the evening due to church activities.  His POCT A1c today is 6.6%, improving from previous visit of 70%.  He denies any significant hypoglycemia.  He attributes some of his improvement in diabetes to taking a milk thistle supplement. Analysis of his meter shows 7-day average of 121, 14-day average 125, 30-day average of 119.) An ACE inhibitor/angiotensin II receptor blocker is not being taken. He does not see a podiatrist.Eye exam is current.  Hyperlipidemia This is a chronic problem. The current episode started more than 1 year ago. The problem is uncontrolled. Recent lipid tests were reviewed and are variable. Exacerbating diseases include chronic renal disease and diabetes. Factors aggravating his hyperlipidemia include smoking and fatty foods. Pertinent negatives include no  chest pain. Current antihyperlipidemic treatment includes statins. The current treatment provides moderate improvement of lipids. Compliance problems include adherence to diet and adherence to exercise.  Risk factors for coronary artery disease include diabetes mellitus, dyslipidemia and male sex.    Review of systems  Constitutional: + Minimally fluctuating body weight,  current Body mass index is 23.53 kg/m. , + fatigue, no subjective hyperthermia, no subjective hypothermia Eyes: no blurry vision, no xerophthalmia ENT: no sore throat, no nodules palpated in throat, no dysphagia/odynophagia, no hoarseness Cardiovascular: no chest pain, no shortness of breath, no palpitations, no leg swelling, (supposed to be having aortic valve stent placed sometime this year) Respiratory: no cough, no shortness of breath Gastrointestinal: no nausea/vomiting/diarrhea Musculoskeletal: no muscle/joint aches Skin: no rashes, no hyperemia Neurological: no tremors, no numbness, no tingling, no dizziness Psychiatric: no depression,  no anxiety    Objective:    BP 119/76   Pulse (!) 101   Ht 5\' 10"  (1.778 m)   Wt 164 lb (74.4 kg)   BMI 23.53 kg/m   Wt Readings from Last 3 Encounters:  06/07/21 164 lb (74.4 kg)  03/08/21 164 lb (74.4 kg)  03/04/21 165 lb (74.8 kg)    BP Readings from Last 3 Encounters:  06/07/21 119/76  03/08/21 98/64  03/04/21 99/63     Physical Exam- Limited  Constitutional:  Body mass index is 23.53 kg/m. , not in acute distress, normal state of mind Eyes:  EOMI, no exophthalmos Neck: Supple Cardiovascular: RRR, no murmurs, rubs, or gallops, no edema Respiratory: Adequate breathing efforts, no crackles, rales, rhonchi, or wheezing Musculoskeletal: no gross deformities, strength intact in all four extremities, no gross restriction of joint movements Skin:  no rashes, no hyperemia, nicotinic discoloration to bilateral fingers with clubbing. Neurological: no tremor with  outstretched hands    Foot exam:  No rashes, ulcers, cuts, calluses, mild onychodystrophy bilaterally.  Good pulses bilat. Good sensation to 10 g monofilament bilat.   Recent Results (from the past 2160 hour(s))  HgB A1c     Status: Abnormal   Collection Time: 06/07/21  1:15 PM  Result Value Ref Range   Hemoglobin A1C 6.6 (A) 4.0 - 5.6 %   HbA1c POC (<> result, manual entry)     HbA1c, POC (prediabetic range)     HbA1c, POC (controlled diabetic range)         Assessment & Plan:   1) DM type 2 causing vascular disease ([place])  - Stephen Che. has currently controlled symptomatic type 2 DM since 70 years of age.  He presents today with his meter and logs showing at goal fasting and tight postprandial glycemic profile.  He reports his evening glucose is low because of his meal timing in the evening due to church activities.  His POCT A1c today is 6.6%, improving from previous visit of 70%.  He denies any significant hypoglycemia.  He attributes some of his improvement in diabetes to taking a milk thistle supplement. Analysis of his meter shows 7-day average of 121, 14-day average 125, 30-day average of 119.  Recent labs are reviewed with him.    -his diabetes is complicated by CVA , chronic heavy smoking, and he remains at extremely high risk for more acute and chronic complications which include CAD, CVA, CKD, retinopathy, and neuropathy. These are all discussed in detail with him.  - Nutritional counseling repeated at each appointment due to patients tendency to fall back in to old habits.  - The patient admits there is a room for improvement in their diet and drink choices. -  Suggestion is made for the patient to avoid simple carbohydrates from their diet including Cakes, Sweet Desserts / Pastries, Ice Cream, Soda (diet and regular), Sweet Tea, Candies, Chips, Cookies, Sweet Pastries, Store Bought Juices, Alcohol in Excess of 1-2 drinks a day, Artificial Sweeteners, Coffee  Creamer, and "Sugar-free" Products. This will help patient to have stable blood glucose profile and potentially avoid unintended weight gain.   - I encouraged the patient to switch to unprocessed or minimally processed complex starch and increased protein intake (animal or plant source), fruits, and vegetables.   - Patient is advised to stick to a routine mealtimes to eat 3 meals a day and avoid unnecessary snacks (to snack only to correct hypoglycemia).  - I have approached him with  the following individualized plan to manage diabetes and patient agrees:   -Based on his near target glycemic presentation, he will do well to continue on his current medication regimen.  He is advised to continue Glipizide 5 mg po twice daily with meals.    -He is encouraged to monitor blood glucose at least twice daily, before breakfast and before bed, and call clinic if he has readings less than 70 or greater than 200 for 3 tests in a row.  -He is not a suitable candidate for metformin, SGLT2 inhibitors, nor incretin therapy.  - Patient specific target  A1c;  LDL, HDL, Triglycerides  were discussed in detail.  2) BP/HTN:  His blood pressure is controlled to target without the use of antihypertensive medications.   3) Lipids/HPL:  His most recent lipid panel from 01/31/21 shows controlled LDL of 81 and elevated triglycerides of 177.  He is advised to continue Crestor 5 mg po daily at bedtime.   4)  Weight/Diet:  His Body mass index is 23.53 kg/m.-weight loss is not advisable for him.  CDE Consult will be initiated . Exercise, and detailed carbohydrates information provided  -  detailed on discharge instructions.  5) Chronic Care/Health Maintenance: -he is on Statin medications and is encouraged to initiate and continue to follow up with Ophthalmology, Dentist,  Podiatrist at least yearly or according to recommendations, and advised to Quit smoking. I have recommended yearly flu vaccine and pneumonia vaccine  at least every 5 years; moderate intensity exercise for up to 150 minutes weekly; and  sleep for at least 7 hours a day.  The patient was counseled on the dangers of tobacco use, and was advised to quit.  Reviewed strategies to maximize success, including removing cigarettes and smoking materials from environment.  He is not ready to quit based on our conversation today.    - I advised patient to maintain close follow up with [name], PA-C for primary care needs.     I spent 25 minutes in the care of the patient today including review of labs from [facility], Lipids, Thyroid Function, Hematology (current and previous including abstractions from other facilities); face-to-face time discussing  his blood glucose readings/logs, discussing hypoglycemia and hyperglycemia episodes and symptoms, medications doses, his options of short and long term treatment based on the latest standards of care / guidelines;  discussion about incorporating lifestyle medicine;  and documenting the encounter.    Please refer to Patient Instructions for Blood Glucose Monitoring and Insulin/Medications Dosing Guide"  in media tab for additional information. Please  also refer to " Patient Self Inventory" in the Media  tab for reviewed elements of pertinent patient history.  Stephen Che. participated in the discussions, expressed understanding, and voiced agreement with the above plans.  All questions were answered to his satisfaction. he is encouraged to contact clinic should he have any questions or concerns prior to his return visit.   Follow up plan: - Return in about 6 months (around 12/07/2021) for Diabetes F/U with A1c in office, Previsit labs, Bring meter and logs.  Rayetta Pigg, [facility] [address] [place], [place] [address] Phone: [phone] Fax: [phone]  06/07/2021, 1:27 PM

## 2021-06-07 NOTE — Patient Instructions (Signed)

## 2021-06-07 NOTE — Patient Instructions (Signed)
Goals  Make sure you eat three meals per day Do not skip meals Drink Glucerna or CIB for a meal if not eating. Increase fresh fruits and vegetables. Keep drinking water Prevent low blood sugars.

## 2021-06-08 ENCOUNTER — Ambulatory Visit: Payer: Medicare Other | Admitting: Vascular Surgery

## 2021-06-29 ENCOUNTER — Ambulatory Visit: Payer: Medicare Other | Admitting: Urology

## 2021-06-30 ENCOUNTER — Encounter: Payer: Self-pay | Admitting: Nutrition

## 2021-08-10 ENCOUNTER — Other Ambulatory Visit: Payer: Self-pay | Admitting: Nurse Practitioner

## 2021-08-10 ENCOUNTER — Other Ambulatory Visit: Payer: Self-pay | Admitting: Cardiology

## 2021-08-31 ENCOUNTER — Other Ambulatory Visit: Payer: Self-pay

## 2021-08-31 ENCOUNTER — Other Ambulatory Visit: Payer: Medicare Other

## 2021-08-31 DIAGNOSIS — C61 Malignant neoplasm of prostate: Secondary | ICD-10-CM

## 2021-08-31 DIAGNOSIS — C775 Secondary and unspecified malignant neoplasm of intrapelvic lymph nodes: Secondary | ICD-10-CM | POA: Diagnosis not present

## 2021-09-01 LAB — PSA: Prostate Specific Ag, Serum: 0.3 ng/mL (ref 0.0–4.0)

## 2021-09-02 ENCOUNTER — Other Ambulatory Visit: Payer: Medicare Other

## 2021-09-02 DIAGNOSIS — Z1331 Encounter for screening for depression: Secondary | ICD-10-CM | POA: Diagnosis not present

## 2021-09-02 DIAGNOSIS — F22 Delusional disorders: Secondary | ICD-10-CM | POA: Diagnosis not present

## 2021-09-02 DIAGNOSIS — I10 Essential (primary) hypertension: Secondary | ICD-10-CM | POA: Diagnosis not present

## 2021-09-02 DIAGNOSIS — E1165 Type 2 diabetes mellitus with hyperglycemia: Secondary | ICD-10-CM | POA: Diagnosis not present

## 2021-09-02 DIAGNOSIS — F419 Anxiety disorder, unspecified: Secondary | ICD-10-CM | POA: Diagnosis not present

## 2021-09-02 DIAGNOSIS — Z0001 Encounter for general adult medical examination with abnormal findings: Secondary | ICD-10-CM | POA: Diagnosis not present

## 2021-09-02 DIAGNOSIS — J439 Emphysema, unspecified: Secondary | ICD-10-CM | POA: Diagnosis not present

## 2021-09-02 DIAGNOSIS — J449 Chronic obstructive pulmonary disease, unspecified: Secondary | ICD-10-CM | POA: Diagnosis not present

## 2021-09-02 DIAGNOSIS — Z6824 Body mass index (BMI) 24.0-24.9, adult: Secondary | ICD-10-CM | POA: Diagnosis not present

## 2021-09-02 DIAGNOSIS — R945 Abnormal results of liver function studies: Secondary | ICD-10-CM | POA: Diagnosis not present

## 2021-09-02 DIAGNOSIS — Z1389 Encounter for screening for other disorder: Secondary | ICD-10-CM | POA: Diagnosis not present

## 2021-09-02 DIAGNOSIS — E782 Mixed hyperlipidemia: Secondary | ICD-10-CM | POA: Diagnosis not present

## 2021-09-02 DIAGNOSIS — I714 Abdominal aortic aneurysm, without rupture: Secondary | ICD-10-CM | POA: Diagnosis not present

## 2021-09-09 ENCOUNTER — Ambulatory Visit (INDEPENDENT_AMBULATORY_CARE_PROVIDER_SITE_OTHER): Payer: Medicare Other | Admitting: Urology

## 2021-09-09 ENCOUNTER — Encounter: Payer: Self-pay | Admitting: Urology

## 2021-09-09 ENCOUNTER — Other Ambulatory Visit: Payer: Self-pay

## 2021-09-09 VITALS — BP 105/69 | HR 93 | Temp 98.0°F | Ht 70.0 in | Wt 163.6 lb

## 2021-09-09 DIAGNOSIS — C61 Malignant neoplasm of prostate: Secondary | ICD-10-CM | POA: Diagnosis not present

## 2021-09-09 DIAGNOSIS — C775 Secondary and unspecified malignant neoplasm of intrapelvic lymph nodes: Secondary | ICD-10-CM | POA: Diagnosis not present

## 2021-09-09 DIAGNOSIS — I2510 Atherosclerotic heart disease of native coronary artery without angina pectoris: Secondary | ICD-10-CM

## 2021-09-09 DIAGNOSIS — N138 Other obstructive and reflux uropathy: Secondary | ICD-10-CM

## 2021-09-09 DIAGNOSIS — R351 Nocturia: Secondary | ICD-10-CM

## 2021-09-09 DIAGNOSIS — N401 Enlarged prostate with lower urinary tract symptoms: Secondary | ICD-10-CM

## 2021-09-09 LAB — URINALYSIS, ROUTINE W REFLEX MICROSCOPIC
Bilirubin, UA: NEGATIVE
Glucose, UA: NEGATIVE
Leukocytes,UA: NEGATIVE
Nitrite, UA: NEGATIVE
Protein,UA: NEGATIVE
RBC, UA: NEGATIVE
Specific Gravity, UA: 1.01 (ref 1.005–1.030)
Urobilinogen, Ur: 0.2 mg/dL (ref 0.2–1.0)
pH, UA: 6 (ref 5.0–7.5)

## 2021-09-09 MED ORDER — TAMSULOSIN HCL 0.4 MG PO CAPS: 0.4000 mg | ORAL_CAPSULE | Freq: Two times a day (BID) | ORAL | 11 refills | Status: DC

## 2021-09-09 MED ORDER — LEUPROLIDE ACETATE (6 MONTH) 45 MG ~~LOC~~ KIT
45.0000 mg | PACK | Freq: Once | SUBCUTANEOUS | Status: AC
  Administered 2021-09-09: 45 mg via SUBCUTANEOUS

## 2021-09-09 NOTE — Patient Instructions (Signed)
Benign Prostatic Hyperplasia Benign prostatic hyperplasia (BPH) is an enlarged prostate gland that is caused by the normal aging process and not by cancer. The prostate is a walnut-sized gland that is involved in the production of semen. It is located in front of the rectum and below the bladder. The bladder stores urine and the urethra is the tube that carries the urine out of the body. The prostate may get bigger as a man gets older. An enlarged prostate can press on the urethra. This can make it harder to pass urine. The build-up of urine in the bladder can cause infection. Back pressure and infection may progress to bladder damage and kidney (renal) failure. What are the causes? This condition is part of a normal aging process. However, not all men develop problems from this condition. If the prostate enlarges away from the urethra, urine flow will not be blocked. If it enlarges toward the urethra and compresses it, there will be problems passing urine. What increases the risk? This condition is more likely to develop in men over the age of 50 years. What are the signs or symptoms? Symptoms of this condition include: Getting up often during the night to urinate. Needing to urinate frequently during the day. Difficulty starting urine flow. Decrease in size and strength of your urine stream. Leaking (dribbling) after urinating. Inability to pass urine. This needs immediate treatment. Inability to completely empty your bladder. Pain when you pass urine. This is more common if there is also an infection. Urinary tract infection (UTI). How is this diagnosed? This condition is diagnosed based on your medical history, a physical exam, and your symptoms. Tests will also be done, such as: A post-void bladder scan. This measures any amount of urine that may remain in your bladder after you finish urinating. A digital rectal exam. In a rectal exam, your health care provider checks your prostate by  putting a lubricated, gloved finger into your rectum to feel the back of your prostate gland. This exam detects the size of your gland and any abnormal lumps or growths. An exam of your urine (urinalysis). A prostate specific antigen (PSA) screening. This is a blood test used to screen for prostate cancer. An ultrasound. This test uses sound waves to electronically produce a picture of your prostate gland. Your health care provider may refer you to a specialist in kidney and prostate diseases (urologist). How is this treated? Once symptoms begin, your health care provider will monitor your condition (active surveillance or watchful waiting). Treatment for this condition will depend on the severity of your condition. Treatment may include: Observation and yearly exams. This may be the only treatment needed if your condition and symptoms are mild. Medicines to relieve your symptoms, including: Medicines to shrink the prostate. Medicines to relax the muscle of the prostate. Surgery in severe cases. Surgery may include: Prostatectomy. In this procedure, the prostate tissue is removed completely through an open incision or with a laparoscope or robotics. Transurethral resection of the prostate (TURP). In this procedure, a tool is inserted through the opening at the tip of the penis (urethra). It is used to cut away tissue of the inner core of the prostate. The pieces are removed through the same opening of the penis. This removes the blockage. Transurethral incision (TUIP). In this procedure, small cuts are made in the prostate. This lessens the prostate's pressure on the urethra. Transurethral microwave thermotherapy (TUMT). This procedure uses microwaves to create heat. The heat destroys and removes a   small amount of prostate tissue. Transurethral needle ablation (TUNA). This procedure uses radio frequencies to destroy and remove a small amount of prostate tissue. Interstitial laser coagulation (ILC).  This procedure uses a laser to destroy and remove a small amount of prostate tissue. Transurethral electrovaporization (TUVP). This procedure uses electrodes to destroy and remove a small amount of prostate tissue. Prostatic urethral lift. This procedure inserts an implant to push the lobes of the prostate away from the urethra. Follow these instructions at home: Take over-the-counter and prescription medicines only as told by your health care provider. Monitor your symptoms for any changes. Contact your health care provider with any changes. Avoid drinking large amounts of liquid before going to bed or out in public. Avoid or reduce how much caffeine or alcohol you drink. Give yourself time when you urinate. Keep all follow-up visits as told by your health care provider. This is important. Contact a health care provider if: You have unexplained back pain. Your symptoms do not get better with treatment. You develop side effects from the medicine you are taking. Your urine becomes very dark or has a bad smell. Your lower abdomen becomes distended and you have trouble passing your urine. Get help right away if: You have a fever or chills. You suddenly cannot urinate. You feel lightheaded, or very dizzy, or you faint. There are large amounts of blood or clots in the urine. Your urinary problems become hard to manage. You develop moderate to severe low back or flank pain. The flank is the side of your body between the ribs and the hip. These symptoms may represent a serious problem that is an emergency. Do not wait to see if the symptoms will go away. Get medical help right away. Call your local emergency services (911 in the [place] Do not drive yourself to the hospital. Summary Benign prostatic hyperplasia (BPH) is an enlarged prostate that is caused by the normal aging process and not by cancer. An enlarged prostate can press on the urethra. This can make it hard to pass urine. This  condition is part of a normal aging process and is more likely to develop in men over the age of 50 years. Get help right away if you suddenly cannot urinate. This information is not intended to replace advice given to you by your health care provider. Make sure you discuss any questions you have with your health care provider. Document Revised: 03/16/2021 Document Reviewed: 08/12/2020 [employer] Patient Education  2022 [employer]  

## 2021-09-09 NOTE — Progress Notes (Signed)
09/09/2021 12:31 PM   [name] Apr 11, 1951 [id number]  Referring provider: [name], PA-C [address] [place],  [place] [address]  Followup metastatic prostate cancer and BPH   HPI: Stephen Palmer is a 70yo here for followup for BPH and prostate cancer. PSA 0.3 improved from 0.4 on ADT.  He has hot flashes which are not bothersome. He has moderate fatigue. IPSS 12 QOL 2. Urine stream strong. Nocturia 0-1x. No hesitancy and no straining to urinate. He is on flomax 0.4mg  BID. He has not had an endovascular stent placed for his AAA.    PMH: Past Medical History:  Diagnosis Date   Abdominal aortic aneurysm (AAA) (HCC)    Anxiety    COPD (chronic obstructive pulmonary disease) (HCC)    Coronary artery calcification seen on CT scan    Essential hypertension    Lung cancer (HCC)    Non small cell carcinoma - XRT   Prostate cancer ([place])    XRT   Stroke ([place])    Type 2 diabetes mellitus ([place])     Surgical History: Past Surgical History:  Procedure Laterality Date   COLONOSCOPY N/A 11/23/2015   Procedure: COLONOSCOPY;  Surgeon: Aviva Signs, MD;  Location: [facility];  Service: Gastroenterology;  Laterality: N/A;   FRACTURE SURGERY     PROSTATE BIOPSY     SINUS SURGERY WITH INSTATRAK      Home Medications:  Allergies as of 09/09/2021       Reactions   Sulfa Antibiotics Hives, Itching, Swelling   Tongue swelling   Sulfasalazine Hives, Itching, Swelling   Tongue swelling   Lipitor [atorvastatin] Other (See Comments)   myalgia   Simvastatin Other (See Comments)   myalgia        Medication List        Accurate as of September 09, 2021 12:31 PM. If you have any questions, ask your nurse or doctor.          albuterol (2.5 MG/3ML) 0.083% nebulizer solution Commonly known as: PROVENTIL Take 2.5 mg by nebulization every 6 (six) hours as needed.   alprazolam 2 MG tablet Commonly known as: XANAX Take 2 mg by mouth 2 (two) times daily.    calcium-vitamin D 500-200 MG-UNIT tablet Commonly known as: OSCAL WITH D Take 1 tablet by mouth.   cyclobenzaprine 10 MG tablet Commonly known as: FLEXERIL Take 10 mg by mouth 3 (three) times daily as needed.   DULoxetine 30 MG capsule Commonly known as: CYMBALTA Take 30 mg by mouth daily.   glipiZIDE 5 MG tablet Commonly known as: GLUCOTROL TAKE 1 TABLET BY MOUTH TWICE DAILY BEFORE A MEAL   milk thistle 175 MG tablet Take 175 mg by mouth daily.   rosuvastatin 5 MG tablet Commonly known as: CRESTOR TAKE 1 TABLET BY MOUTH EVERY DAY   tamsulosin 0.4 MG Caps capsule Commonly known as: FLOMAX Take 1 capsule (0.4 mg total) by mouth in the morning and at bedtime.   vitamin C 500 MG tablet Commonly known as: ASCORBIC ACID Take 500 mg by mouth daily.   Vitamin D 50 MCG (2000 UT) Caps Take by mouth daily.   vitamin E 1000 UNIT capsule Take 1,000 Units by mouth daily.   zinc gluconate 50 MG tablet Take 50 mg by mouth daily.        Allergies:  Allergies  Allergen Reactions   Sulfa Antibiotics Hives, Itching and Swelling    Tongue swelling   Sulfasalazine Hives, Itching  and Swelling    Tongue swelling   Lipitor [Atorvastatin] Other (See Comments)    myalgia   Simvastatin Other (See Comments)    myalgia    Family History: Family History  Problem Relation Age of Onset   Heart disease Father        before age 6   Heart disease Mother    Cancer Neg Hx     Social History:  reports that he has been smoking cigarettes. He has a 88.00 pack-year smoking history. He has never used smokeless tobacco. He reports that he does not drink alcohol and does not use drugs.  ROS: All other review of systems were reviewed and are negative except what is noted above in HPI  Physical Exam: BP 105/69 (BP Location: Left Arm, Patient Position: Sitting, Cuff Size: Normal)   Pulse 93   Temp 98 F (36.7 C)   Ht 5\' 10"  (1.778 m)   Wt 163 lb 9.6 oz (74.2 kg)   BMI 23.47 kg/m    Constitutional:  Alert and oriented, No acute distress. HEENT: [place] AT, moist mucus membranes.  Trachea midline, no masses. Cardiovascular: No clubbing, cyanosis, or edema. Respiratory: Normal respiratory effort, no increased work of breathing. GI: Abdomen is soft, nontender, nondistended, no abdominal masses GU: No CVA tenderness.  Lymph: No cervical or inguinal lymphadenopathy. Skin: No rashes, bruises or suspicious lesions. Neurologic: Grossly intact, no focal deficits, moving all 4 extremities. Psychiatric: Normal mood and affect.  Laboratory Data: Lab Results  Component Value Date   WBC 10.5 05/15/2019   HGB 14.8 05/15/2019   HCT 46.4 05/15/2019   MCV 96.5 05/15/2019   PLT 263 05/15/2019    Lab Results  Component Value Date   CREATININE 1.21 01/31/2021    Lab Results  Component Value Date   PSA 9.7 (H) 05/19/2020   PSA 8.7 (H) 05/11/2020    Lab Results  Component Value Date   TESTOSTERONE 166 (L) 05/11/2020    Lab Results  Component Value Date   HGBA1C 6.6 (A) 06/07/2021    Urinalysis    Component Value Date/Time   APPEARANCEUR Clear 03/04/2021 1320   GLUCOSEU Negative 03/04/2021 1320   BILIRUBINUR Negative 03/04/2021 1320   PROTEINUR Negative 03/04/2021 1320   NITRITE Negative 03/04/2021 1320   LEUKOCYTESUR Negative 03/04/2021 1320    Lab Results  Component Value Date   LABMICR Comment 03/04/2021    Pertinent Imaging:  Results for orders placed during the hospital encounter of 05/14/08  DG Abd 1 View  Narrative Clinical Data: Left ureteral calculus.  ABDOMEN - 1 VIEW  Comparison: CT abdomen and pelvis 04/08/08.  Findings: No unexpected calcifications are seen over the renal shadows, expected course of the ureters or urinary bladder. Specifically, the small left UVJ stone seen the patient's CT scan is not visible on this study.  Bowel gas pattern is normal.  No focal bony abnormality.  IMPRESSION: Negative for plain film evidence of  urinary tract calculi.  Provider: Jennye Boroughs, Brooke Dare  No results found for this or any previous visit.  No results found for this or any previous visit.  No results found for this or any previous visit.  No results found for this or any previous visit.  No results found for this or any previous visit.  No results found for this or any previous visit.  No results found for this or any previous visit.   Assessment & Plan:    1. Prostate cancer metastatic to intrapelvic  lymph node (HCC) -Eligard 45mg  today and RTC 6 months with PSA - Urinalysis, Routine w reflex microscopic - leuprolide (6 Month) (ELIGARD) injection 45 mg  2. Nocturia -Flomax 0.4mg  BID  3. Benign prostatic hyperplasia with urinary obstruction -FLomax 0.4mg  BID   No follow-ups on file.  Nicolette Bang, MD  [facility] [place]

## 2021-09-09 NOTE — Progress Notes (Signed)
Urological Symptom Review  Patient is experiencing the following symptoms: Erection problems (male only)   Review of Systems  Gastrointestinal (upper)  : Negative for upper GI symptoms  Gastrointestinal (lower) : Negative for lower GI symptoms  Constitutional : Fatigue  Skin: Itching  Eyes: Blurred vision  Ear/Nose/Throat : Negative for Ear/Nose/Throat symptoms  Hematologic/Lymphatic: Negative for Hematologic/Lymphatic symptoms  Cardiovascular : Negative for cardiovascular symptoms  Respiratory : Negative for respiratory symptoms  Endocrine: Excessive thirst  Musculoskeletal: Back pain Joint pain  Neurological: Dizziness  Psychologic: Depression Anxiety  Eligard SubQ Injection   Due to Prostate Cancer patient is present today for a Eligard Injection.  Medication: Eligard 6 month Dose: 45 mg  Location: right  Lot: [id number] Exp: 02-2023  Patient tolerated well, no complications were noted  Performed by: Valerya Maxton LPN

## 2021-10-14 ENCOUNTER — Telehealth: Payer: Self-pay | Admitting: *Deleted

## 2021-10-14 NOTE — Telephone Encounter (Signed)
CALLED PATIENT TO INFORM THAT I HAVE FAXED ORDERS TO [facility] FOR HIS LABS AND SCAN AND THAT THEY WILL BE CALLING HIM, PATIENT VERIFIED UNDERSTANDING THIS

## 2021-10-19 ENCOUNTER — Ambulatory Visit: Payer: Medicare Other | Admitting: Urology

## 2021-10-19 ENCOUNTER — Telehealth: Payer: Self-pay | Admitting: *Deleted

## 2021-10-19 NOTE — Telephone Encounter (Signed)
Called patient to inform that the appt. on Nov. 8 is a telephone call, patient verified understanding this

## 2021-10-21 DIAGNOSIS — I7143 Infrarenal abdominal aortic aneurysm, without rupture: Secondary | ICD-10-CM | POA: Diagnosis not present

## 2021-10-21 DIAGNOSIS — I358 Other nonrheumatic aortic valve disorders: Secondary | ICD-10-CM | POA: Diagnosis not present

## 2021-10-21 DIAGNOSIS — I700 Atherosclerosis of aorta: Secondary | ICD-10-CM | POA: Diagnosis not present

## 2021-10-21 DIAGNOSIS — R911 Solitary pulmonary nodule: Secondary | ICD-10-CM | POA: Diagnosis not present

## 2021-10-21 DIAGNOSIS — C3492 Malignant neoplasm of unspecified part of left bronchus or lung: Secondary | ICD-10-CM | POA: Diagnosis not present

## 2021-10-21 DIAGNOSIS — J479 Bronchiectasis, uncomplicated: Secondary | ICD-10-CM | POA: Diagnosis not present

## 2021-10-21 DIAGNOSIS — J432 Centrilobular emphysema: Secondary | ICD-10-CM | POA: Diagnosis not present

## 2021-10-21 DIAGNOSIS — I2510 Atherosclerotic heart disease of native coronary artery without angina pectoris: Secondary | ICD-10-CM | POA: Diagnosis not present

## 2021-10-21 DIAGNOSIS — J439 Emphysema, unspecified: Secondary | ICD-10-CM | POA: Diagnosis not present

## 2021-10-21 DIAGNOSIS — Z1389 Encounter for screening for other disorder: Secondary | ICD-10-CM | POA: Diagnosis not present

## 2021-10-24 ENCOUNTER — Encounter: Payer: Self-pay | Admitting: Urology

## 2021-10-24 NOTE — Progress Notes (Signed)
Patient reports lumbar pain 7/10 on average, and fatigue. No other symptoms reported.  Meaningful use complete.  Currently on Flomax 0.4mg  as directed. Urology follow-up scheduled for March 24th w/ Dr. Alyson Ingles -per [facility] [phone].  Patient notified of 1:00pm-10/25/21 telephone appointment and verbalized understanding.

## 2021-10-25 ENCOUNTER — Ambulatory Visit
Admission: RE | Admit: 2021-10-25 | Discharge: 2021-10-25 | Disposition: A | Discharge status: Home / Self Care | Payer: Medicare Other | Source: Ambulatory Visit | Attending: Urology | Admitting: Urology

## 2021-10-25 DIAGNOSIS — C3412 Malignant neoplasm of upper lobe, left bronchus or lung: Secondary | ICD-10-CM | POA: Diagnosis not present

## 2021-10-25 DIAGNOSIS — Z08 Encounter for follow-up examination after completed treatment for malignant neoplasm: Secondary | ICD-10-CM | POA: Diagnosis not present

## 2021-10-25 DIAGNOSIS — C3492 Malignant neoplasm of unspecified part of left bronchus or lung: Secondary | ICD-10-CM

## 2021-10-25 NOTE — Progress Notes (Signed)
Radiation Oncology         [phone]) [phone] ________________________________  Name: Stephen Palmer. MRN: [id number]  Date: 10/25/2021  DOB: 29-Nov-1951  Post Treatment Note- Conducted via telemedicine WebEx due to current COVID-19 concerns for limiting patient exposure  CC: [name], PA-C  [name], MD  Diagnosis:    70 y.o. male with h/o 3 cm squamous cell carcinoma of the left upper lobe of the lung - Stage IA     Interval Since Last Radiation:  3 year, 2 months  Curative, Definitive SBRT:  10/05/2017, 10/09/2017, 10/12/2017- The target was treated to 54 Gy in 3 fractions of 18 Gy.  12/13/16 - 02/09/17: Prostate IMRT ; 40 fractions to 78 Gy in combination with LT-ADT under the care of Dr. Lianne Cure in [place], [place].  Narrative:  I spoke with the patient via WebEx to spare the patient unnecessary potential exposure in the healthcare setting during the current COVID-19 pandemic.  The patient was notified in advance and gave permission to proceed with this visit format. He has recovered well from the effects of radiotherapy and remains without complaints.  He had a recent CT Chest on 10/24/21 which shows a stable appearance of the previously treated left upper lobe nodule and no evidence of disease progression or metastatic disease. He continues in routine follow-up with his vascular specialist, Dr. Scot Dock, and more recently was referred for consult with Dr. Wilhelmenia Blase at [facility] for monitoring of the known infrarenal AAA which has remained radiographically stable.   Since we saw him last, he has continued on ADT for a recurrence of his prostate cancer in a solitary pelvic lymph node noted on CT imaging in 03/2020. He is being followed by Dr. Alyson Ingles and started ADT on 06/30/20 for an elevated PSA at 8.7 on 05/11/20 and further increased to 9.7 when repeated on 05/19/20. His PSA has PSA responded favorably with a decrease to 1.3 on 09/01/20 and most recently, decreased to 0.3 on 08/31/21 which is  down from 0.4 in 02/2021.  His most recent 6 month Eligard injection was given 09/09/21 and his next scheduled follow up with Dr. Alyson Ingles is on 03/10/22.                     On review of systems, the patient states that he is doing well overall. He continues with a mild, occasional productive cough with clear to whitish sputum. He will occasionally have harsh coughing episodes that make it difficult to catch his breath and at times are painful.  He has continued using his inhalers as prescribed by his PCP but wonders if he should see a lung specialist.  He specifically denies hemoptysis, increased shortness of breath, chest pain, fevers, chills or night sweats.  He continues with mild fatigue which is not progressively worseining.  He admits to having a very sedentary lifestyle despite encouragement from his wife and endocrinologist to be more active.  He denies abdominal pain, N/V or diarrhea.  He has a healthy appetite and is maintaining his weight. He had a stroke in 2020 which he has pretty much recovered from fully.    ALLERGIES:  is allergic to sulfa antibiotics, sulfasalazine, lipitor [atorvastatin], and simvastatin.  Meds: Current Outpatient Medications  Medication Sig Dispense Refill   albuterol (PROVENTIL) (2.5 MG/3ML) 0.083% nebulizer solution Take 2.5 mg by nebulization every 6 (six) hours as needed.     alprazolam (XANAX) 2 MG tablet Take 2 mg by mouth 2 (  two) times daily.     calcium-vitamin D (OSCAL WITH D) 500-200 MG-UNIT tablet Take 1 tablet by mouth.     Cholecalciferol (VITAMIN D) 50 MCG (2000 UT) CAPS Take by mouth daily.     cyclobenzaprine (FLEXERIL) 10 MG tablet Take 10 mg by mouth 3 (three) times daily as needed.      DULoxetine (CYMBALTA) 30 MG capsule Take 30 mg by mouth daily.     glipiZIDE (GLUCOTROL) 5 MG tablet TAKE 1 TABLET BY MOUTH TWICE DAILY BEFORE A MEAL 180 tablet 0   milk thistle 175 MG tablet Take 175 mg by mouth daily.     rosuvastatin (CRESTOR) 5 MG tablet TAKE  1 TABLET BY MOUTH EVERY DAY 90 tablet 0   tamsulosin (FLOMAX) 0.4 MG CAPS capsule Take 1 capsule (0.4 mg total) by mouth in the morning and at bedtime. 60 capsule 11   vitamin C (ASCORBIC ACID) 500 MG tablet Take 500 mg by mouth daily.     vitamin E 1000 UNIT capsule Take 1,000 Units by mouth daily.     zinc gluconate 50 MG tablet Take 50 mg by mouth daily.     No current facility-administered medications for this encounter.    Physical Findings:  vitals were not taken for this visit.  Pain Assessment Pain Score: 7  (lumbar)Unable to assess due to telephone follow-up visit format.  Lab Findings: Lab Results  Component Value Date   WBC 10.5 05/15/2019   HGB 14.8 05/15/2019   HCT 46.4 05/15/2019   MCV 96.5 05/15/2019   PLT 263 05/15/2019     Radiographic Findings: No results found.  Impression/Plan: 1.  70 y.o. 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    The patient continues to remain stable both clinically and radiographically. We will continue with serial CT scans of the chest with contrast and BMP performed at [facility] since this is closer to home in [place], [place] at six month intervals until 5 years when, at that point in time, he will have an annual low dose CT scan for surveillance. He will return in 6 months to review the findings from the scan to be ordered prior to that visit. He knows to call with any questions or concerns in the interim.  He is comfortable with this plan.  2. Infrarenal Abdominal Aortic Aneurysm: continue in follow up with Dr. Scot Dock and Dr. Wilhelmenia Blase for continued monitoring and management. We will continue to monitor of follow up scans.  3. Recurrent prostate cancer with metastasis to pelvic lymph node. Continue in routine follow up with Dr. Alyson Ingles for continued monitoring and management.  Given current concerns for patient exposure during the COVID-19 pandemic, this encounter was conducted via  [place]. The patient has given verbal consent for this type of encounter. The time spent during this encounter was 30 minutes. The attendants for this meeting include Adelyne Marchese PA-C and patient, Treavor Blomquist. During the encounter Mykale Gandolfo PA-C and patient, Antion Andres, were located at [facility] Radiation Oncology Department.     [name], PA-C

## 2021-11-03 DIAGNOSIS — F22 Delusional disorders: Secondary | ICD-10-CM | POA: Diagnosis not present

## 2021-11-03 DIAGNOSIS — F17200 Nicotine dependence, unspecified, uncomplicated: Secondary | ICD-10-CM | POA: Diagnosis not present

## 2021-11-03 DIAGNOSIS — Z6824 Body mass index (BMI) 24.0-24.9, adult: Secondary | ICD-10-CM | POA: Diagnosis not present

## 2021-11-03 DIAGNOSIS — F419 Anxiety disorder, unspecified: Secondary | ICD-10-CM | POA: Diagnosis not present

## 2021-11-09 ENCOUNTER — Other Ambulatory Visit: Payer: Self-pay | Admitting: Nurse Practitioner

## 2021-11-09 ENCOUNTER — Other Ambulatory Visit: Payer: Self-pay | Admitting: Cardiology

## 2021-11-25 ENCOUNTER — Telehealth: Payer: Self-pay | Admitting: Cardiology

## 2021-11-25 NOTE — Telephone Encounter (Signed)
Patient is requesting orders to have lab work prior to appointment on 01/20/22 with Dr. Domenic Polite.

## 2021-11-28 ENCOUNTER — Other Ambulatory Visit: Payer: Self-pay | Admitting: *Deleted

## 2021-11-28 ENCOUNTER — Encounter: Payer: Self-pay | Admitting: *Deleted

## 2021-11-28 DIAGNOSIS — E782 Mixed hyperlipidemia: Secondary | ICD-10-CM

## 2021-11-28 NOTE — Telephone Encounter (Signed)
No answer. Will reach out to patient via my chart.

## 2021-11-28 NOTE — Telephone Encounter (Signed)
No mention that labs were requested from last OV note.  Please advise if you want him to have labs prior to his next visit.

## 2021-11-28 NOTE — Telephone Encounter (Signed)
Left message to return call 

## 2021-11-28 NOTE — Telephone Encounter (Signed)
Pt is returning call from earlier this morning

## 2021-11-29 DIAGNOSIS — E1159 Type 2 diabetes mellitus with other circulatory complications: Secondary | ICD-10-CM | POA: Diagnosis not present

## 2021-11-29 DIAGNOSIS — E782 Mixed hyperlipidemia: Secondary | ICD-10-CM | POA: Diagnosis not present

## 2021-11-30 LAB — COMPREHENSIVE METABOLIC PANEL
ALT: 16 IU/L (ref 0–44)
AST: 20 IU/L (ref 0–40)
Albumin/Globulin Ratio: 1.8 (ref 1.2–2.2)
Albumin: 4.9 g/dL — ABNORMAL HIGH (ref 3.8–4.8)
Alkaline Phosphatase: 102 IU/L (ref 44–121)
BUN/Creatinine Ratio: 11 (ref 10–24)
BUN: 13 mg/dL (ref 8–27)
Bilirubin Total: 0.3 mg/dL (ref 0.0–1.2)
CO2: 22 mmol/L (ref 20–29)
Calcium: 9.3 mg/dL (ref 8.6–10.2)
Chloride: 101 mmol/L (ref 96–106)
Creatinine, Ser: 1.2 mg/dL (ref 0.76–1.27)
Globulin, Total: 2.8 g/dL (ref 1.5–4.5)
Glucose: 147 mg/dL — ABNORMAL HIGH (ref 70–99)
Potassium: 4.9 mmol/L (ref 3.5–5.2)
Sodium: 138 mmol/L (ref 134–144)
Total Protein: 7.7 g/dL (ref 6.0–8.5)
eGFR: 65 mL/min/{1.73_m2} (ref >59)

## 2021-11-30 LAB — LIPID PANEL
Chol/HDL Ratio: 3.1 ratio (ref 0.0–5.0)
Cholesterol, Total: 151 mg/dL (ref 100–199)
HDL: 48 mg/dL (ref >39)
LDL Chol Calc (NIH): 83 mg/dL (ref 0–99)
Triglycerides: 107 mg/dL (ref 0–149)
VLDL Cholesterol Cal: 20 mg/dL (ref 5–40)

## 2021-12-01 ENCOUNTER — Telehealth: Payer: Self-pay | Admitting: *Deleted

## 2021-12-01 MED ORDER — ROSUVASTATIN CALCIUM 10 MG PO TABS: 10.0000 mg | ORAL_TABLET | Freq: Every day | ORAL | 3 refills | Status: DC

## 2021-12-01 NOTE — Telephone Encounter (Signed)
-----   Message from Satira Sark, MD sent at 11/30/2021  8:59 AM EST ----- Results reviewed.  Follow-up LDL 83.  Since he has been tolerating Crestor 5 mg daily, would increase to 10 mg daily to try and get LDL closer to guidelines.

## 2021-12-01 NOTE — Telephone Encounter (Signed)
Patient informed. Copy sent to PCP °

## 2021-12-04 IMAGING — CT CT ANGIO CHEST
2 of 7 series · 15 of 36 positions shown · IV contrast (iopamidol)
Comparison: 06/27/2019

CLINICAL DATA: Thoracic aortic aneurysm, follow-up

EXAM:
CT ANGIOGRAPHY CHEST, ABDOMEN AND PELVIS
TECHNIQUE: Multidetector CT imaging through the chest, abdomen and pelvis was
performed using the standard protocol during bolus administration of
intravenous contrast. Multiplanar reconstructed images and MIPs were
obtained and reviewed to evaluate the vascular anatomy.
CONTRAST:  75mL 489EDB-16O IOPAMIDOL (489EDB-16O) INJECTION 76%

[Series 5: cta cap aneurysm 2.00 bv36 s3 axial arterial · axial · arterial · 0.83mm/px · z∈[+1136,+1730]mm · 14 of 341 slices shown]
[im 22/341  lung]
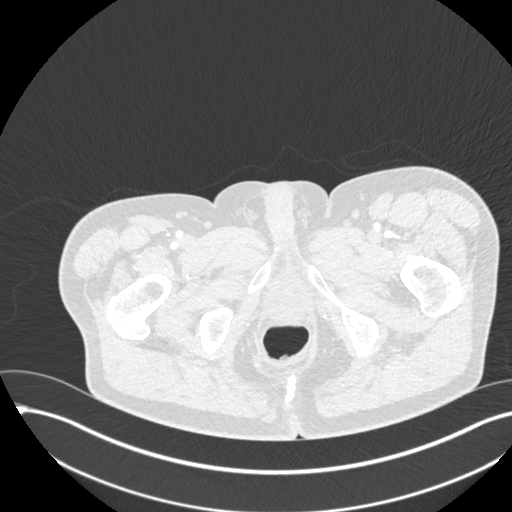
[im 43/341  mediastinal]
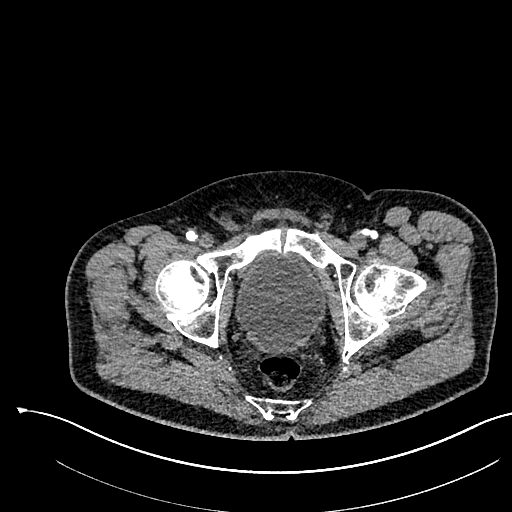
[im 64/341  lung]
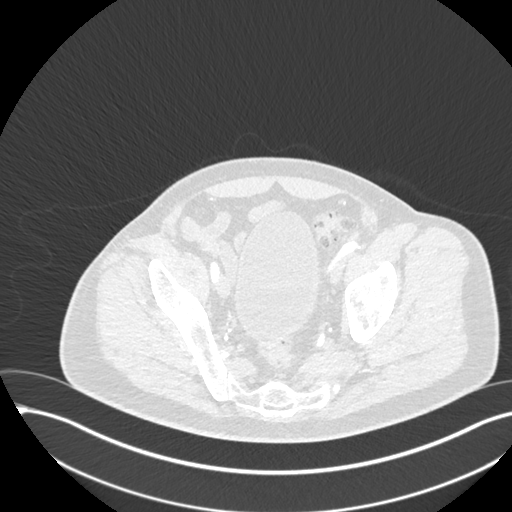
[im 86/341  mediastinal]
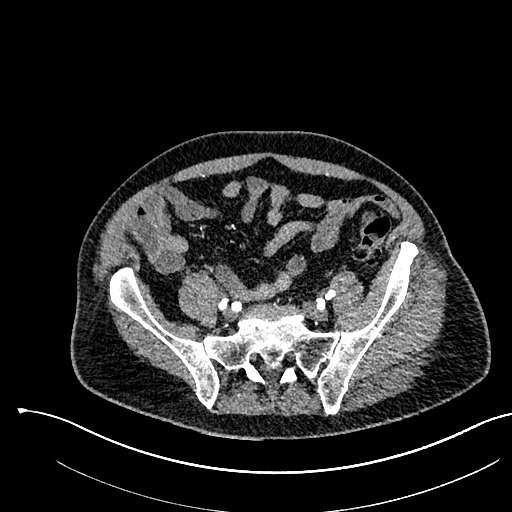
[im 107/341  lung]
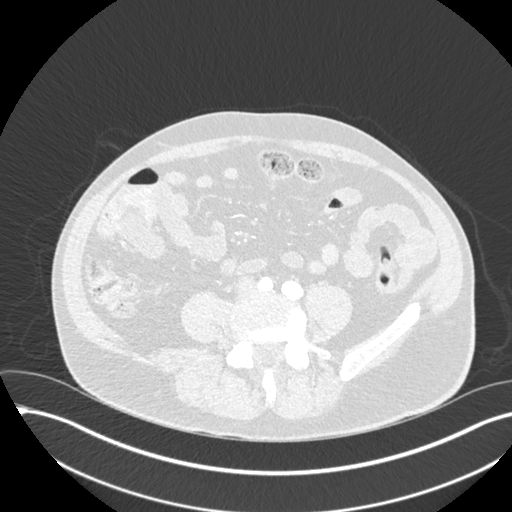
[im 128/341  mediastinal]
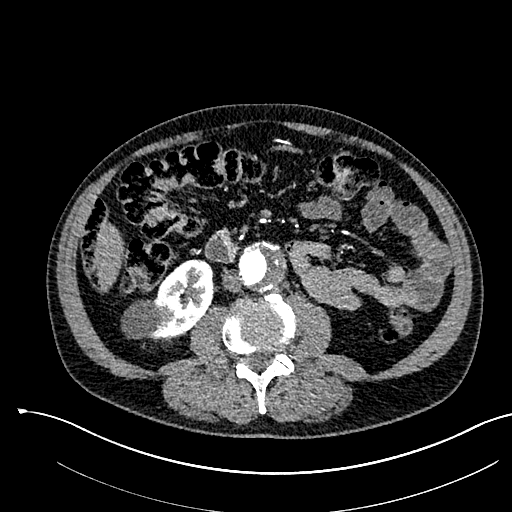
[im 149/341  lung]
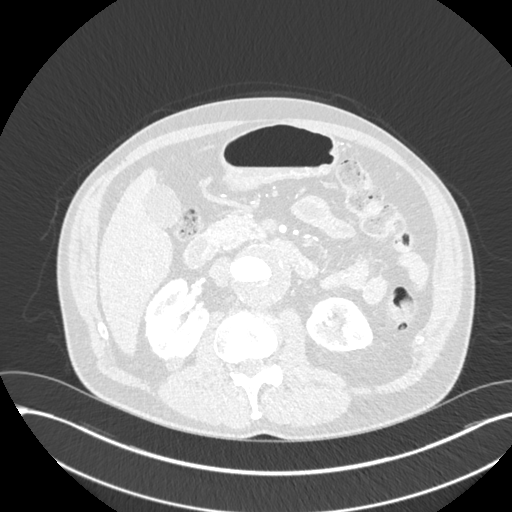
[im 192/341  mediastinal]
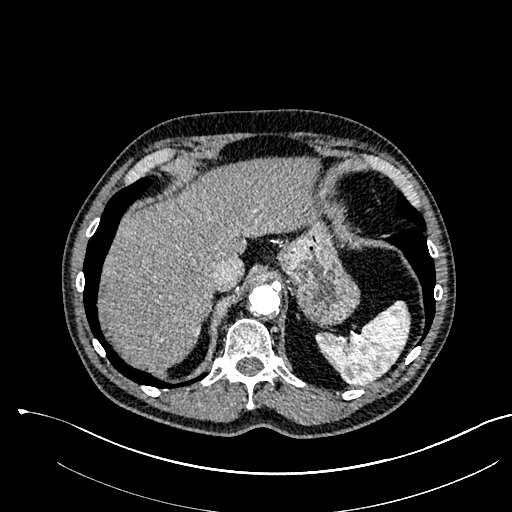
[im 213/341  lung]
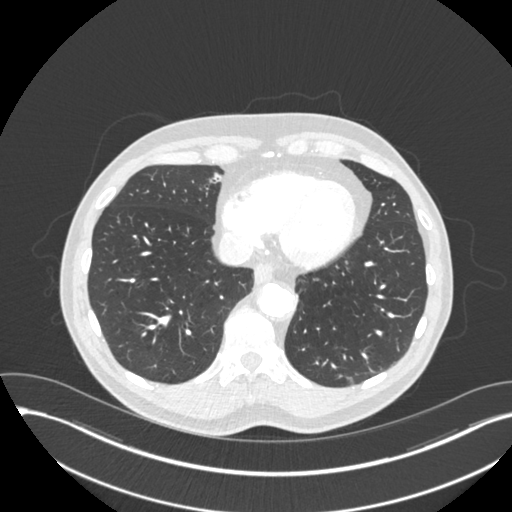
[im 234/341  mediastinal]
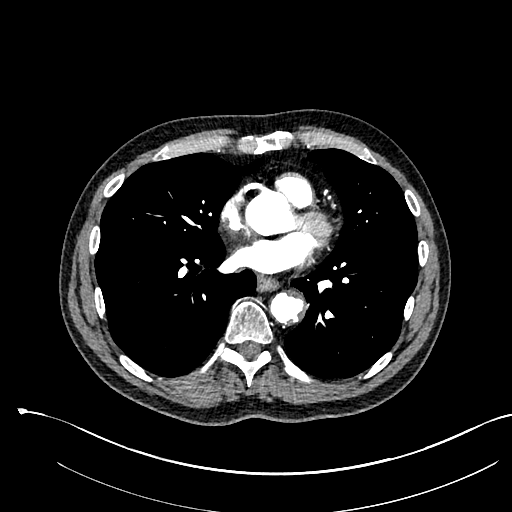
[im 256/341  lung]
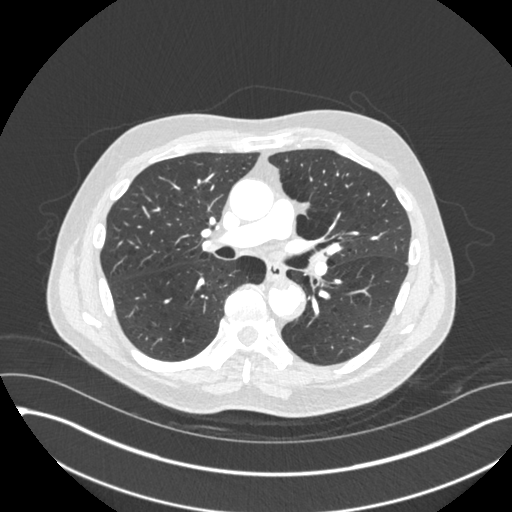
[im 277/341  mediastinal]
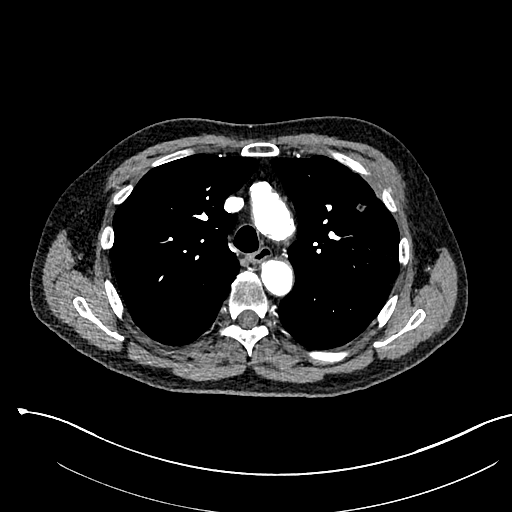
[im 298/341  lung]
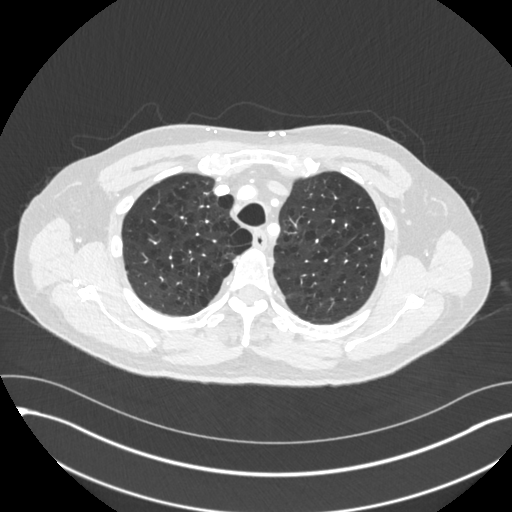
[im 319/341  mediastinal]
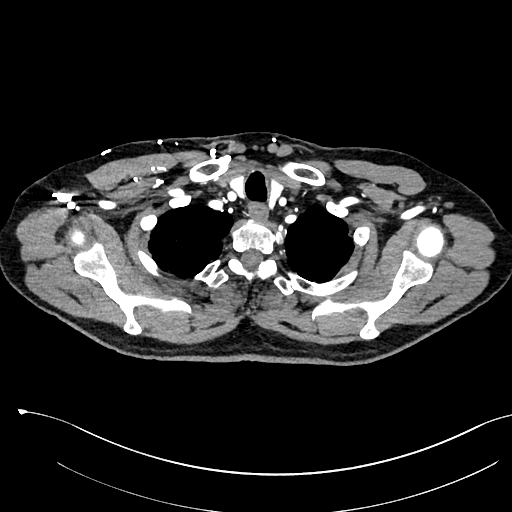

[Series 7: cta cap aneurysm 2.00 bv36 s3 cor st · coronal · 0.81mm/px · 1 of 150 slices shown]
[im 75/150  mediastinal]
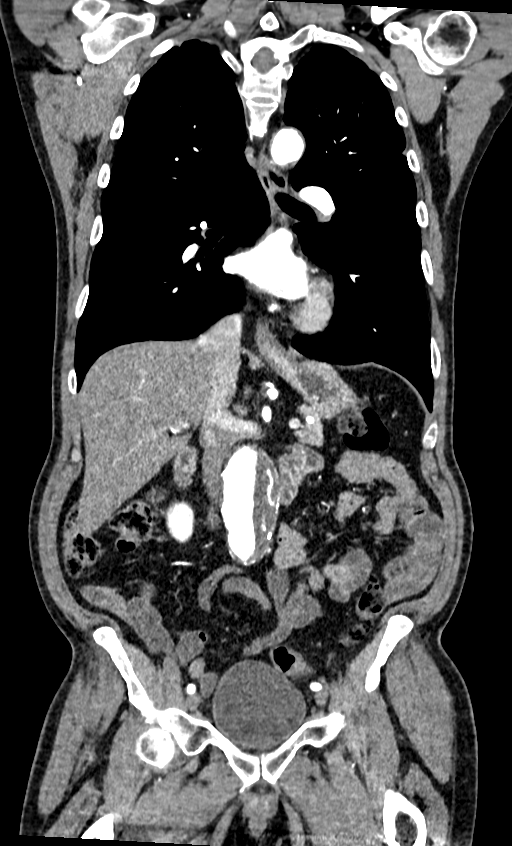

[15 of 36 positions shown; findings below may reference images not displayed]

FINDINGS: CTA CHEST FINDINGS

Cardiovascular: Heart size normal. No pericardial effusion.
Satisfactory opacification of pulmonary arteries noted, and there is
no evidence of pulmonary emboli. 3-vessel coronary calcifications.
Good contrast opacification of the thoracic aorta without dissection
or stenosis. Ascending segment normal in caliber. Arch measures 3 cm
diameter. There is extensive atheromatous plaque in the distal arch
and descending thoracic segment. Moderate eccentric nonocclusive
mural thrombus throughout the descending thoracic segment which
measures up to 3.2 cm diameter. Classic 3 vessel brachiocephalic
arterial origin anatomy with mild atheromatous calcified plaque, no
high-grade stenosis or aneurysm.

Mediastinum/Nodes: No mediastinal or hilar adenopathy.

Lungs/Pleura: No pleural effusion. No pneumothorax. Pulmonary
emphysema. 0.9 cm left upper lobe nodule with increasing linear
opacities extending towards the pleural laterally and anteriorly.
Some increase in subsegmental atelectasis/consolidation in the
inferomedial right middle lobe.

Musculoskeletal: Old fracture deformity of the left fourth rib. No
acute fracture or worrisome bone lesion.

Review of the MIP images confirms the above findings.

CTA ABDOMEN AND PELVIS FINDINGS

VASCULAR

Aorta: Fusiform abdominal aortic aneurysm measuring 3.3 cm diameter
juxtarenal, infrarenal 5.3 x 5.1 cm maximum transverse dimensions
(previously 5.2 x 5.1), tapering to 2.4 cm above the diaphragm.
There is moderate eccentric nonocclusive mural thrombus throughout,
predominantly in the aneurysmal segment. No dissection or stenosis.
No evidence of impending rupture.

Celiac: Patent without evidence of aneurysm, dissection, vasculitis
or significant stenosis.

SMA: Patent without evidence of aneurysm, dissection, vasculitis or
significant stenosis.

Renals: Duplicated left. There is mild origin stenosis of the
dominant superior moiety, of only mild severity, patent distally.
There is a short-segment origin stenosis of the inferior moiety of
at least moderate severity, patent distally.

Duplicated right, posterior dominant, both widely patent.

IMA: Probable short segment origin occlusion, reconstituted distally
by visceral collaterals.

Inflow: Moderate calcified atheromatous plaque throughout the iliac
arterial system without high-grade stenosis. The right common iliac
measures up to 1.3 cm diameter, left 1.4 cm.

Veins: Patent hepatic veins, portal vein, SMV, splenic vein,
bilateral renal veins, IVC. No venous pathology identified.

Review of the MIP images confirms the above findings.

NON-VASCULAR

Hepatobiliary: No focal liver abnormality is seen. No gallstones,
gallbladder wall thickening, or biliary dilatation.

Pancreas: Unremarkable. No pancreatic ductal dilatation or
surrounding inflammatory changes.

Spleen: Normal in size without focal abnormality.

Adrenals/Urinary Tract: Adrenal glands unremarkable. Probable
bilateral renal cysts, largest 4.7 cm from the right lower pole. No
hydronephrosis. Urinary bladder physiologically distended.

Stomach/Bowel: Stomach is incompletely distended. Small bowel is
decompressed. Normal appendix. Colon is nondilated with scattered
descending and sigmoid diverticula; no significant adjacent
inflammatory/edematous change or abscess.

Lymphatic: 1.5 cm enlarged right external iliac lymph node, new
since previous. No other abdominal or retroperitoneal adenopathy
localized.

Reproductive: Mild prostate enlargement with central coarse
calcifications and adjacent metallic markers.

Other: No ascites. No free air.

Musculoskeletal: No acute or significant osseous findings.

Review of the MIP images confirms the above findings.
IMPRESSION: 1. Stable dilatation of aortic arch and descending thoracic aorta up
to 3.2 cm. Recommend annual imaging followup by CTA or MRA. This
recommendation follows 8737
ACCF/AHA/AATS/ACR/ASA/SCA/YIWEN/NANUTSA/MAPELI/GIOVANNIE Guidelines for the
Diagnosis and Management of Patients with Thoracic Aortic Disease.
Circulation.8737; 121: E266-e369
2. 5.3 cm infrarenal abdominal aortic aneurysm (previously 5.2)
without complicating features. Recommend followup by abdomen and
pelvis CTA in 3-6 months, and vascular surgery referral/consultation
if not already obtained. This recommendation follows ACR consensus
guidelines: White Paper of the ACR Incidental Findings Committee II
on Vascular Findings. [HOSPITAL] 7490; [DATE].
3. 0.9 cm left upper lobe pulmonary nodule (previously 0.7), with
increasing linear opacities extending towards the pleural laterally
and anteriorly. PET-CT may be useful to differentiate
residual/recurrent neoplasm from scarring/atelectasis.
4. 1.5 cm enlarged right external iliac lymph node, new since
previous. Correlate with PSA. Follow-up recommended.

Aortic Atherosclerosis (ZIH8D-H9L.L) and Emphysema (ZIH8D-FW0.P).

## 2021-12-05 ENCOUNTER — Ambulatory Visit (INDEPENDENT_AMBULATORY_CARE_PROVIDER_SITE_OTHER): Payer: Medicare Other | Admitting: "Endocrinology

## 2021-12-05 ENCOUNTER — Encounter: Payer: Self-pay | Admitting: "Endocrinology

## 2021-12-05 VITALS — BP 120/78 | HR 76 | Ht 70.0 in | Wt 165.2 lb

## 2021-12-05 DIAGNOSIS — E559 Vitamin D deficiency, unspecified: Secondary | ICD-10-CM

## 2021-12-05 DIAGNOSIS — E1159 Type 2 diabetes mellitus with other circulatory complications: Secondary | ICD-10-CM

## 2021-12-05 DIAGNOSIS — F17200 Nicotine dependence, unspecified, uncomplicated: Secondary | ICD-10-CM

## 2021-12-05 DIAGNOSIS — E782 Mixed hyperlipidemia: Secondary | ICD-10-CM

## 2021-12-05 DIAGNOSIS — I2510 Atherosclerotic heart disease of native coronary artery without angina pectoris: Secondary | ICD-10-CM

## 2021-12-05 LAB — POCT GLYCOSYLATED HEMOGLOBIN (HGB A1C): HbA1c, POC (controlled diabetic range): 6.7 % (ref 0.0–7.0)

## 2021-12-05 MED ORDER — GLIPIZIDE 5 MG PO TABS: 5.0000 mg | ORAL_TABLET | Freq: Two times a day (BID) | ORAL | 1 refills | Status: DC

## 2021-12-05 NOTE — Patient Instructions (Signed)

## 2021-12-05 NOTE — Progress Notes (Signed)
12/05/2021, 5:47 PM          Endocrinology follow-up note   Subjective:    Patient ID: Stephen Che., male    DOB: Dec 31, 1950.  Stephen Che. is being seen in follow up in the management of his currently  uncontrolled symptomatic type 2 diabetes requested by  [name], PA-C   Past Medical History:  Diagnosis Date   Abdominal aortic aneurysm (AAA)    Anxiety    COPD (chronic obstructive pulmonary disease) ([place])    Coronary artery calcification seen on CT scan    Essential hypertension    Lung cancer ([place])    Non small cell carcinoma - XRT   Prostate cancer ([place])    XRT   Stroke ([place])    Type 2 diabetes mellitus ([place])    Past Surgical History:  Procedure Laterality Date   COLONOSCOPY N/A 11/23/2015   Procedure: COLONOSCOPY;  Surgeon: Aviva Signs, MD;  [facility];  Service: Gastroenterology;  Laterality: N/A;   FRACTURE SURGERY     PROSTATE BIOPSY     SINUS SURGERY WITH INSTATRAK     Social History   Socioeconomic History   Marital status: Married    Spouse name: Not on file   Number of children: Not on file   Years of education: Not on file   Highest education level: Not on file  Occupational History   Not on file  Tobacco Use   Smoking status: Every Day    Packs/day: 2.00    Years: 44.00    Pack years: 88.00    Types: Cigarettes   Smokeless tobacco: Never  Vaping Use   Vaping Use: Never used  Substance and Sexual Activity   Alcohol use: No    Alcohol/week: 0.0 standard drinks   Drug use: No   Sexual activity: Not on file  Other Topics Concern   Not on file  Social History Narrative   Not on file   Social Determinants of Health   Financial Resource Strain: Not on file  Food Insecurity: Not on file  Transportation Needs: Not on file  Physical Activity: Not on file  Stress: Not on file  Social Connections: Not on file   Outpatient Encounter Medications as of 12/05/2021  Medication Sig    albuterol (PROVENTIL) (2.5 MG/3ML) 0.083% nebulizer solution Take 2.5 mg by nebulization every 6 (six) hours as needed.   alprazolam (XANAX) 2 MG tablet Take 2 mg by mouth 2 (two) times daily.   calcium-vitamin D (OSCAL WITH D) 500-200 MG-UNIT tablet Take 1 tablet by mouth.   Cholecalciferol (VITAMIN D) 50 MCG (2000 UT) CAPS Take by mouth daily.   cyclobenzaprine (FLEXERIL) 10 MG tablet Take 10 mg by mouth 3 (three) times daily as needed.    DULoxetine (CYMBALTA) 30 MG capsule Take 30 mg by mouth daily.   glipiZIDE (GLUCOTROL) 5 MG tablet Take 1 tablet (5 mg total) by mouth 2 (two) times daily before a meal.   milk thistle 175 MG tablet Take 175 mg by mouth daily.   rosuvastatin (CRESTOR) 10 MG tablet Take 1 tablet (10 mg total) by mouth daily. (Patient taking differently: Take 20 mg by mouth daily.)   tamsulosin (FLOMAX) 0.4 MG CAPS capsule Take 1 capsule (0.4 mg total) by mouth in the morning and at bedtime.   vitamin C (ASCORBIC ACID) 500 MG tablet Take 500 mg by mouth daily.   vitamin E  1000 UNIT capsule Take 1,000 Units by mouth daily.   zinc gluconate 50 MG tablet Take 50 mg by mouth daily.   [DISCONTINUED] glipiZIDE (GLUCOTROL) 5 MG tablet TAKE 1 TABLET BY MOUTH TWICE DAILY BEFORE A MEAL   No facility-administered encounter medications on file as of 12/05/2021.    ALLERGIES: Allergies  Allergen Reactions   Sulfa Antibiotics Hives, Itching and Swelling    Tongue swelling   Sulfasalazine Hives, Itching and Swelling    Tongue swelling   Lipitor [Atorvastatin] Other (See Comments)    myalgia   Simvastatin Other (See Comments)    myalgia    VACCINATION STATUS:  There is no immunization history on file for this patient.  Diabetes He presents for his follow-up diabetic visit. He has type 2 diabetes mellitus. Onset time: He was diagnosed at approximate age of 32 years. His disease course has been improving. There are no hypoglycemic associated symptoms. Pertinent negatives for  hypoglycemia include no confusion, headaches, pallor or seizures. Associated symptoms include blurred vision. Pertinent negatives for diabetes include no chest pain, no fatigue, no polydipsia, no polyphagia, no polyuria and no weakness. There are no hypoglycemic complications. Symptoms are improving. Diabetic complications include a CVA. Risk factors for coronary artery disease include diabetes mellitus, hypertension, male sex and tobacco exposure. Current diabetic treatment includes oral agent (monotherapy) (Is currently taking glipizide 10 mg p.o. twice daily.). His weight is fluctuating minimally. He is following a generally unhealthy diet. When asked about meal planning, he reported none. He has not had a previous visit with a dietitian. He never participates in exercise. His home blood glucose trend is decreasing steadily. His breakfast blood glucose range is generally 130-140 mg/dl. His bedtime blood glucose range is generally 130-140 mg/dl. His overall blood glucose range is 130-140 mg/dl. (He presents with near target glycemic profile , POC of 6.7% . No sustained major hypoglycemia. ) An ACE inhibitor/angiotensin II receptor blocker is not being taken. Eye exam is current.  Review of systems: Limited as above.    Objective:    BP 120/78    Pulse 76    Ht '5\' 10"'  (1.778 m)    Wt 165 lb 3.2 oz (74.9 kg)    BMI 23.70 kg/m   Wt Readings from Last 3 Encounters:  12/05/21 165 lb 3.2 oz (74.9 kg)  09/09/21 163 lb 9.6 oz (74.2 kg)  06/07/21 164 lb (74.4 kg)       Recent Results (from the past 2160 hour(s))  Urinalysis, Routine w reflex microscopic     Status: Abnormal   Collection Time: 09/09/21 11:42 AM  Result Value Ref Range   Specific Gravity, UA 1.010 1.005 - 1.030   pH, UA 6.0 5.0 - 7.5   Color, UA Yellow Yellow   Appearance Ur Clear Clear   Leukocytes,UA Negative Negative   Protein,UA Negative Negative/Trace   Glucose, UA Negative Negative   Ketones, UA Trace (A) Negative   RBC,  UA Negative Negative   Bilirubin, UA Negative Negative   Urobilinogen, Ur 0.2 0.2 - 1.0 mg/dL   Nitrite, UA Negative Negative   Microscopic Examination Comment     Comment: Microscopic follows if indicated.  Lipid panel     Status: None   Collection Time: 11/29/21  3:01 PM  Result Value Ref Range   Cholesterol, Total 151 100 - 199 mg/dL   Triglycerides 107 0 - 149 mg/dL   HDL 48 >39 mg/dL   VLDL Cholesterol Cal 20 5 - 40 mg/dL  LDL Chol Calc (NIH) 83 0 - 99 mg/dL   Chol/HDL Ratio 3.1 0.0 - 5.0 ratio    Comment:                                   T. Chol/HDL Ratio                                             Men  Women                               1/2 Avg.Risk  3.4    3.3                                   Avg.Risk  5.0    4.4                                2X Avg.Risk  9.6    7.1                                3X Avg.Risk 23.4   11.0   Comprehensive metabolic panel     Status: Abnormal   Collection Time: 11/29/21  3:03 PM  Result Value Ref Range   Glucose 147 (H) 70 - 99 mg/dL   BUN 13 8 - 27 mg/dL   Creatinine, Ser 1.20 0.76 - 1.27 mg/dL   eGFR 65 >59 mL/min/1.73   BUN/Creatinine Ratio 11 10 - 24   Sodium 138 134 - 144 mmol/L   Potassium 4.9 3.5 - 5.2 mmol/L   Chloride 101 96 - 106 mmol/L   CO2 22 20 - 29 mmol/L   Calcium 9.3 8.6 - 10.2 mg/dL   Total Protein 7.7 6.0 - 8.5 g/dL   Albumin 4.9 (H) 3.8 - 4.8 g/dL   Globulin, Total 2.8 1.5 - 4.5 g/dL   Albumin/Globulin Ratio 1.8 1.2 - 2.2   Bilirubin Total 0.3 0.0 - 1.2 mg/dL   Alkaline Phosphatase 102 44 - 121 IU/L   AST 20 0 - 40 IU/L   ALT 16 0 - 44 IU/L  HgB A1c     Status: None   Collection Time: 12/05/21  1:45 PM  Result Value Ref Range   Hemoglobin A1C     HbA1c POC (<> result, manual entry)     HbA1c, POC (prediabetic range)     HbA1c, POC (controlled diabetic range) 6.7 0.0 - 7.0 %      Assessment & Plan:   1. DM type 2 causing vascular disease ([place])  - Stephen Che. has currently uncontrolled  symptomatic type 2 DM since 70 years of age. He presents with near target glycemic profile , POC of 6.7% . No sustained major hypoglycemia.  Recent labs are reviewed with him.   -his diabetes is complicated by CVA , chronic heavy smoking, and he remains at extremely high risk for more acute and chronic complications which include CAD, CVA, CKD, retinopathy, and neuropathy. These are all discussed in detail with him.  - I have counseled him on  diet management by adopting a carbohydrate restricted/protein rich diet.  - he  admits there is a room for improvement in his diet and drink choices. -  Suggestion is made for him to avoid simple carbohydrates  from his diet including Cakes, Sweet Desserts / Pastries, Ice Cream, Soda (diet and regular), Sweet Tea, Candies, Chips, Cookies, Sweet Pastries,  Store Bought Juices, Alcohol in Excess of  1-2 drinks a day, Artificial Sweeteners, Coffee Creamer, and "Sugar-free" Products. This will help patient to have stable blood glucose profile and potentially avoid unintended weight gain.  - I encouraged him to switch to  unprocessed or minimally processed complex starch and increased protein intake (animal or plant source), fruits, and vegetables.  - he is advised to stick to a routine mealtimes to eat 3 meals  a day and avoid unnecessary snacks ( to snack only to correct hypoglycemia).    - I have approached him with the following individualized plan to manage diabetes and patient agrees:   -Based on his presentation with near target glycemic profile and his clear reluctance to go on insulin, he will be kept on non-insulin treatment for now.   -He is responding and benefiting from glipizide treatment.  He is advised to continue  glipizide to 5 mg p.o. twice daily with breakfast and supper, continue monitoring blood glucose at least 2 times daily-before breakfast and before supper and at any other time as needed. - he is encouraged to call clinic for blood  glucose levels less than 70 or above 300 mg /dl.  -He is not a suitable candidate for metformin, SGLT2 inhibitors, nor incretin therapy. -He will be reapproached for insulin treatment if he loses control by next visit.  - Patient specific target  A1c;  LDL, HDL, Triglycerides  were discussed in detail.  2) BP/HTN:  His blood pressure is controlled to target.   He is not on any antihypertensive medications, reported sulfa allergy.   3) Lipids/HPL: His recent labs show LDL of 83.  He is on Crestor 20 mg po qhs.  4)  Weight/Diet: His BMI is 23.7-weight loss is not advisable for him.  CDE Consult will be initiated . Exercise, and detailed carbohydrates information provided  -  detailed on discharge instructions.  5) Chronic Care/Health Maintenance:  -he  Is not on ACEI/ARB and Statin medications and  is encouraged to initiate and continue to follow up with Ophthalmology, Dentist,  Podiatrist at least yearly or according to recommendations, and advised to  Quit smoking (this is his #1health risk). I have recommended yearly flu vaccine and pneumonia vaccine at least every 5 years; moderate intensity exercise for up to 150 minutes weekly; and  sleep for at least 7 hours a day.  The patient was counseled on the dangers of tobacco use, and was advised to quit.  Reviewed strategies to maximize success, including removing cigarettes and smoking materials from environment.  - I advised patient to maintain close follow up with [name], PA-C for primary care needs.    I spent 40 minutes in the care of the patient today including review of labs from [place], Lipids, Thyroid Function, Hematology (current and previous including abstractions from other facilities); face-to-face time discussing  his blood glucose readings/logs, discussing hypoglycemia and hyperglycemia episodes and symptoms, medications doses, his options of short and long term treatment based on the latest standards of care /  guidelines;  discussion about incorporating lifestyle medicine;  and documenting the encounter.    Please  refer to Patient Instructions for Blood Glucose Monitoring and Insulin/Medications Dosing Guide"  in media tab for additional information. Please  also refer to " Patient Self Inventory" in the Media  tab for reviewed elements of pertinent patient history.  Stephen Che. participated in the discussions, expressed understanding, and voiced agreement with the above plans.  All questions were answered to his satisfaction. he is encouraged to contact clinic should he have any questions or concerns prior to his return visit.  Follow up plan: - Return in about 6 months (around 06/05/2022) for F/U with Pre-visit Labs, Meter, Logs, A1c here.Glade Lloyd, MD [facility] [address] [place],  [address] Phone: [phone]  Fax: [phone]    12/05/2021, 5:47 PM  This note was partially dictated with voice recognition software. Similar sounding words can be transcribed inadequately or may not  be corrected upon review.

## 2021-12-07 ENCOUNTER — Ambulatory Visit: Payer: Medicare Other | Admitting: "Endocrinology

## 2021-12-11 ENCOUNTER — Other Ambulatory Visit: Payer: Self-pay | Admitting: Cardiology

## 2021-12-13 ENCOUNTER — Other Ambulatory Visit: Payer: Self-pay

## 2021-12-13 MED ORDER — ROSUVASTATIN CALCIUM 10 MG PO TABS: 10.0000 mg | ORAL_TABLET | Freq: Every day | ORAL | 3 refills | Status: DC

## 2021-12-22 ENCOUNTER — Encounter: Payer: Self-pay | Admitting: Cardiology

## 2021-12-22 ENCOUNTER — Ambulatory Visit (INDEPENDENT_AMBULATORY_CARE_PROVIDER_SITE_OTHER): Payer: Medicare Other | Admitting: Cardiology

## 2021-12-22 VITALS — BP 128/70 | HR 88 | Ht 70.0 in | Wt 166.2 lb

## 2021-12-22 DIAGNOSIS — Z720 Tobacco use: Secondary | ICD-10-CM

## 2021-12-22 DIAGNOSIS — E782 Mixed hyperlipidemia: Secondary | ICD-10-CM

## 2021-12-22 DIAGNOSIS — I2510 Atherosclerotic heart disease of native coronary artery without angina pectoris: Secondary | ICD-10-CM | POA: Diagnosis not present

## 2021-12-22 NOTE — Progress Notes (Signed)
Cardiology Office Note  Date: 12/22/2021   ID: [name], DOB 1951/03/14, MRN [id number]  PCP:  [name], PA-C  Cardiologist:  Rozann Lesches, MD Electrophysiologist:  None   Chief Complaint  Patient presents with   Cardiac follow-up    History of Present Illness: Stephen Zuercher. is a 71 y.o. male last seen in March 2022.  He is here for a follow-up visit.  He does not describe any active angina symptoms with typical activities.  He is following with Dr. Wilhelmenia Blase at [facility] for management of his AAA.  He anticipates follow-up CT imaging in the near future.  I reviewed his medications which are noted below, Crestor was just recently increased for tighter control of LDL in light of his vascular disease.  I personally reviewed his ECG today which shows normal sinus rhythm.  Past Medical History:  Diagnosis Date   Abdominal aortic aneurysm (AAA)    Anxiety    COPD (chronic obstructive pulmonary disease) ([facility])    Coronary artery calcification seen on CT scan    Essential hypertension    Lung cancer ([facility])    Non small cell carcinoma - XRT   Prostate cancer ([place])    XRT   Stroke ([place])    Type 2 diabetes mellitus ([place])     Past Surgical History:  Procedure Laterality Date   COLONOSCOPY N/A 11/23/2015   Procedure: COLONOSCOPY;  Surgeon: Aviva Signs, MD;  [facility];  Service: Gastroenterology;  Laterality: N/A;   FRACTURE SURGERY     PROSTATE BIOPSY     SINUS SURGERY WITH INSTATRAK      Current Outpatient Medications  Medication Sig Dispense Refill   albuterol (PROVENTIL) (2.5 MG/3ML) 0.083% nebulizer solution Take 2.5 mg by nebulization every 6 (six) hours as needed.     alprazolam (XANAX) 2 MG tablet Take 2 mg by mouth 2 (two) times daily.     calcium-vitamin D (OSCAL WITH D) 500-200 MG-UNIT tablet Take 1 tablet by mouth.     Cholecalciferol (VITAMIN D) 50 MCG (2000 UT) CAPS Take by mouth daily.     cyclobenzaprine (FLEXERIL)  10 MG tablet Take 10 mg by mouth 3 (three) times daily as needed.      DULoxetine (CYMBALTA) 30 MG capsule Take 30 mg by mouth daily.     glipiZIDE (GLUCOTROL) 5 MG tablet Take 1 tablet (5 mg total) by mouth 2 (two) times daily before a meal. 180 tablet 1   milk thistle 175 MG tablet Take 175 mg by mouth daily.     rosuvastatin (CRESTOR) 10 MG tablet Take 1 tablet (10 mg total) by mouth daily. 90 tablet 3   tamsulosin (FLOMAX) 0.4 MG CAPS capsule Take 1 capsule (0.4 mg total) by mouth in the morning and at bedtime. 60 capsule 11   vitamin C (ASCORBIC ACID) 500 MG tablet Take 500 mg by mouth daily.     vitamin E 1000 UNIT capsule Take 1,000 Units by mouth daily.     zinc gluconate 50 MG tablet Take 50 mg by mouth daily.     No current facility-administered medications for this visit.   Allergies:  Sulfa antibiotics, Sulfasalazine, Lipitor [atorvastatin], and Simvastatin   ROS: No palpitations or syncope.  Physical Exam: VS:  BP 128/70    Pulse 88    Ht 5\' 10"  (1.778 m)    Wt 166 lb 3.2 oz (75.4 kg)    SpO2 94%  BMI 23.85 kg/m , BMI Body mass index is 23.85 kg/m.  Wt Readings from Last 3 Encounters:  12/22/21 166 lb 3.2 oz (75.4 kg)  12/05/21 165 lb 3.2 oz (74.9 kg)  09/09/21 163 lb 9.6 oz (74.2 kg)    General: Patient appears comfortable at rest. HEENT: Conjunctiva and lids normal, wearing a mask. Neck: Supple, no elevated JVP or carotid bruits, no thyromegaly. Lungs: Clear to auscultation, nonlabored breathing at rest. Cardiac: Regular rate and rhythm, no S3, 1/6 systolic murmur. Extremities: No pitting edema.  ECG:  An ECG dated 09/02/2020 was personally reviewed today and demonstrated:  Sinus rhythm with left atrial enlargement.  Recent Labwork: 01/31/2021: TSH 1.830 11/29/2021: ALT 16; AST 20; BUN 13; Creatinine, Ser 1.20; Potassium 4.9; Sodium 138     Component Value Date/Time   CHOL 151 11/29/2021 1501   TRIG 107 11/29/2021 1501   HDL 48 11/29/2021 1501   CHOLHDL 3.1  11/29/2021 1501   LDLCALC 83 11/29/2021 1501    Other Studies Reviewed Today:  Lexiscan Myoview 08/22/2018: Normal perfusion. No significant ischemia or scar. This is a low risk study. Nuclear stress EF: 70%.  Assessment and Plan:  1.  Multivessel coronary artery calcification by CT imaging.  He is asymptomatic in terms of angina symptoms and underwent a low risk Myoview as of 2019.  Plan is to continue medical therapy and observation, he is tolerating statin therapy, Crestor recently increased to 10 mg daily following LDL of 83.  2.  Abdominal aortic aneurysm, now following with Dr. Wilhelmenia Blase at [facility].  3.  Longstanding tobacco abuse, he has not been motivated to quit.  Medication Adjustments/Labs and Tests Ordered: Current medicines are reviewed at length with the patient today.  Concerns regarding medicines are outlined above.   Tests Ordered: No orders of the defined types were placed in this encounter.   Medication Changes: No orders of the defined types were placed in this encounter.   Disposition:  Follow up  1 year.  Signed, Satira Sark, MD, [facility] 12/22/2021 3:35 PM    [facility] [address], [place], [place] [address] Phone: [phone]; Fax: [phone]

## 2021-12-22 NOTE — Addendum Note (Signed)
Addended by: Merlene Laughter on: 12/22/2021 04:54 PM   Modules accepted: Orders

## 2021-12-22 NOTE — Patient Instructions (Addendum)

## 2022-01-04 DIAGNOSIS — R911 Solitary pulmonary nodule: Secondary | ICD-10-CM | POA: Diagnosis not present

## 2022-01-04 DIAGNOSIS — Z720 Tobacco use: Secondary | ICD-10-CM | POA: Diagnosis not present

## 2022-01-04 DIAGNOSIS — I10 Essential (primary) hypertension: Secondary | ICD-10-CM | POA: Diagnosis not present

## 2022-01-04 DIAGNOSIS — E785 Hyperlipidemia, unspecified: Secondary | ICD-10-CM | POA: Diagnosis not present

## 2022-01-04 DIAGNOSIS — Z8249 Family history of ischemic heart disease and other diseases of the circulatory system: Secondary | ICD-10-CM | POA: Diagnosis not present

## 2022-01-04 DIAGNOSIS — I7141 Pararenal abdominal aortic aneurysm, without rupture: Secondary | ICD-10-CM | POA: Diagnosis not present

## 2022-01-04 DIAGNOSIS — F32A Depression, unspecified: Secondary | ICD-10-CM | POA: Diagnosis not present

## 2022-01-04 DIAGNOSIS — Z923 Personal history of irradiation: Secondary | ICD-10-CM | POA: Diagnosis not present

## 2022-01-04 DIAGNOSIS — F419 Anxiety disorder, unspecified: Secondary | ICD-10-CM | POA: Diagnosis not present

## 2022-01-04 DIAGNOSIS — Z7984 Long term (current) use of oral hypoglycemic drugs: Secondary | ICD-10-CM | POA: Diagnosis not present

## 2022-01-04 DIAGNOSIS — Z882 Allergy status to sulfonamides status: Secondary | ICD-10-CM | POA: Diagnosis not present

## 2022-01-04 DIAGNOSIS — F17210 Nicotine dependence, cigarettes, uncomplicated: Secondary | ICD-10-CM | POA: Diagnosis not present

## 2022-01-04 DIAGNOSIS — E119 Type 2 diabetes mellitus without complications: Secondary | ICD-10-CM | POA: Diagnosis not present

## 2022-01-04 DIAGNOSIS — I714 Abdominal aortic aneurysm, without rupture, unspecified: Secondary | ICD-10-CM | POA: Diagnosis not present

## 2022-01-04 DIAGNOSIS — J439 Emphysema, unspecified: Secondary | ICD-10-CM | POA: Diagnosis not present

## 2022-01-04 DIAGNOSIS — Z8546 Personal history of malignant neoplasm of prostate: Secondary | ICD-10-CM | POA: Diagnosis not present

## 2022-01-04 DIAGNOSIS — I7143 Infrarenal abdominal aortic aneurysm, without rupture: Secondary | ICD-10-CM | POA: Diagnosis not present

## 2022-01-04 DIAGNOSIS — Z8673 Personal history of transient ischemic attack (TIA), and cerebral infarction without residual deficits: Secondary | ICD-10-CM | POA: Diagnosis not present

## 2022-01-04 DIAGNOSIS — Z85118 Personal history of other malignant neoplasm of bronchus and lung: Secondary | ICD-10-CM | POA: Diagnosis not present

## 2022-01-20 ENCOUNTER — Ambulatory Visit: Payer: Medicare Other | Admitting: Cardiology

## 2022-02-22 DIAGNOSIS — Z20828 Contact with and (suspected) exposure to other viral communicable diseases: Secondary | ICD-10-CM | POA: Diagnosis not present

## 2022-02-23 DIAGNOSIS — M545 Low back pain, unspecified: Secondary | ICD-10-CM | POA: Diagnosis not present

## 2022-02-23 DIAGNOSIS — E663 Overweight: Secondary | ICD-10-CM | POA: Diagnosis not present

## 2022-02-23 DIAGNOSIS — F22 Delusional disorders: Secondary | ICD-10-CM | POA: Diagnosis not present

## 2022-02-23 DIAGNOSIS — F419 Anxiety disorder, unspecified: Secondary | ICD-10-CM | POA: Diagnosis not present

## 2022-02-23 DIAGNOSIS — Z6824 Body mass index (BMI) 24.0-24.9, adult: Secondary | ICD-10-CM | POA: Diagnosis not present

## 2022-03-03 ENCOUNTER — Other Ambulatory Visit: Payer: Self-pay

## 2022-03-03 ENCOUNTER — Other Ambulatory Visit: Payer: Medicare Other

## 2022-03-03 DIAGNOSIS — C61 Malignant neoplasm of prostate: Secondary | ICD-10-CM

## 2022-03-03 DIAGNOSIS — C775 Secondary and unspecified malignant neoplasm of intrapelvic lymph nodes: Secondary | ICD-10-CM | POA: Diagnosis not present

## 2022-03-04 LAB — PSA: Prostate Specific Ag, Serum: 3.9 ng/mL (ref 0.0–4.0)

## 2022-03-10 ENCOUNTER — Encounter: Payer: Self-pay | Admitting: Urology

## 2022-03-10 ENCOUNTER — Other Ambulatory Visit: Payer: Self-pay

## 2022-03-10 ENCOUNTER — Ambulatory Visit (INDEPENDENT_AMBULATORY_CARE_PROVIDER_SITE_OTHER): Payer: Medicare Other | Admitting: Urology

## 2022-03-10 VITALS — BP 95/63 | HR 99

## 2022-03-10 DIAGNOSIS — C61 Malignant neoplasm of prostate: Secondary | ICD-10-CM

## 2022-03-10 DIAGNOSIS — I2510 Atherosclerotic heart disease of native coronary artery without angina pectoris: Secondary | ICD-10-CM | POA: Diagnosis not present

## 2022-03-10 DIAGNOSIS — C775 Secondary and unspecified malignant neoplasm of intrapelvic lymph nodes: Secondary | ICD-10-CM | POA: Diagnosis not present

## 2022-03-10 LAB — URINALYSIS, ROUTINE W REFLEX MICROSCOPIC
Bilirubin, UA: NEGATIVE
Glucose, UA: NEGATIVE
Ketones, UA: NEGATIVE
Leukocytes,UA: NEGATIVE
Nitrite, UA: NEGATIVE
Protein,UA: NEGATIVE
RBC, UA: NEGATIVE
Specific Gravity, UA: 1.01 (ref 1.005–1.030)
Urobilinogen, Ur: 0.2 mg/dL (ref 0.2–1.0)
pH, UA: 6 (ref 5.0–7.5)

## 2022-03-10 MED ORDER — TAMSULOSIN HCL 0.4 MG PO CAPS: 0.4000 mg | ORAL_CAPSULE | Freq: Two times a day (BID) | ORAL | 11 refills | Status: DC

## 2022-03-10 MED ORDER — LEUPROLIDE ACETATE (6 MONTH) 45 MG ~~LOC~~ KIT
45.0000 mg | PACK | Freq: Once | SUBCUTANEOUS | Status: AC
  Administered 2022-03-10: 45 mg via SUBCUTANEOUS

## 2022-03-10 NOTE — Progress Notes (Signed)
Eligard SubQ Injection  ? ?Due to Prostate Cancer patient is present today for a Eligard Injection. ? ?Medication: Eligard 6 month ?Dose: 45 mg  ?Location: left  ? ?Patient tolerated well, no complications were noted ? ?Performed by: Liam Bossman LPN ? ?

## 2022-03-10 NOTE — Progress Notes (Signed)
? ?03/10/2022 ?12:52 PM  ? ?Stephen Palmer. ?1951-06-07 ?[id number] ? ?Referring provider: [name], PA-C ?[address] ?[place],  [place] [address] ? ?Followup prostate cancer ? ? ?HPI: ?[name] is a 71yo here for followup for prostate cancer and BPH. PSA increased from 0.3 to 3.9 on ADT. He has mild hot flashes. He has fatigue. No new bone pain. IPSS 5 QOL 1 on flomax 0.4mg  BID. Urine stream strong. Nocturia 1-2x. No urinary hesitancy. No other complaints today. ? ? ?PMH: ?Past Medical History:  ?Diagnosis Date  ? Abdominal aortic aneurysm (AAA)   ? Anxiety   ? COPD (chronic obstructive pulmonary disease) ([place])   ? Coronary artery calcification seen on CT scan   ? Essential hypertension   ? Lung cancer ([place])   ? Non small cell carcinoma - XRT  ? Prostate cancer ([place])   ? XRT  ? Stroke [facility])   ? Type 2 diabetes mellitus ([place])   ? ? ?Surgical History: ?Past Surgical History:  ?Procedure Laterality Date  ? COLONOSCOPY N/A 11/23/2015  ? Procedure: COLONOSCOPY;  Surgeon: Aviva Signs, MD;  [facility];  Service: Gastroenterology;  Laterality: N/A;  ? FRACTURE SURGERY    ? PROSTATE BIOPSY    ? SINUS SURGERY WITH INSTATRAK    ? ? ?Home Medications:  ?Allergies as of 03/10/2022   ? ?   Reactions  ? Sulfa Antibiotics Hives, Itching, Swelling  ? Tongue swelling  ? Sulfasalazine Hives, Itching, Swelling  ? Tongue swelling  ? Lipitor [atorvastatin] Other (See Comments)  ? myalgia  ? Simvastatin Other (See Comments)  ? myalgia  ? ?  ? ?  ?Medication List  ?  ? ?  ? Accurate as of March 10, 2022 12:52 PM. If you have any questions, ask your nurse or doctor.  ?  ?  ? ?  ? ?albuterol (2.5 MG/3ML) 0.083% nebulizer solution ?Commonly known as: PROVENTIL ?Take 2.5 mg by nebulization every 6 (six) hours as needed. ?  ?alprazolam 2 MG tablet ?Commonly known as: Duanne Moron ?Take 2 mg by mouth 2 (two) times daily. ?  ?calcium-vitamin D 500-200 MG-UNIT tablet ?Commonly known as: OSCAL WITH D ?Take 1 tablet by  mouth. ?  ?citalopram 40 MG tablet ?Commonly known as: CELEXA ?Take 20-40 mg by mouth daily. ?  ?cyclobenzaprine 10 MG tablet ?Commonly known as: FLEXERIL ?Take 10 mg by mouth 3 (three) times daily as needed. ?  ?DULoxetine 30 MG capsule ?Commonly known as: CYMBALTA ?Take 30 mg by mouth daily. ?  ?glipiZIDE 5 MG tablet ?Commonly known as: GLUCOTROL ?Take 1 tablet (5 mg total) by mouth 2 (two) times daily before a meal. ?  ?milk thistle 175 MG tablet ?Take 175 mg by mouth daily. ?  ?rosuvastatin 10 MG tablet ?Commonly known as: CRESTOR ?Take 1 tablet (10 mg total) by mouth daily. ?  ?tamsulosin 0.4 MG Caps capsule ?Commonly known as: FLOMAX ?Take 1 capsule (0.4 mg total) by mouth in the morning and at bedtime. ?  ?vitamin C 500 MG tablet ?Commonly known as: ASCORBIC ACID ?Take 500 mg by mouth daily. ?  ?Vitamin D 50 MCG (2000 UT) Caps ?Take by mouth daily. ?  ?vitamin E 1000 UNIT capsule ?Take 1,000 Units by mouth daily. ?  ?zinc gluconate 50 MG tablet ?Take 50 mg by mouth daily. ?  ? ?  ? ? ?Allergies:  ?Allergies  ?Allergen Reactions  ? Sulfa Antibiotics Hives, Itching and Swelling  ?  Tongue swelling  ?  Sulfasalazine Hives, Itching and Swelling  ?  Tongue swelling  ? Lipitor [Atorvastatin] Other (See Comments)  ?  myalgia  ? Simvastatin Other (See Comments)  ?  myalgia  ? ? ?Family History: ?Family History  ?Problem Relation Age of Onset  ? Heart disease Father   ?     before age 20  ? Heart disease Mother   ? Cancer Neg Hx   ? ? ?Social History:  reports that he has been smoking cigarettes. He has a 88.00 pack-year smoking history. He has never used smokeless tobacco. He reports that he does not drink alcohol and does not use drugs. ? ?ROS: ?All other review of systems were reviewed and are negative except what is noted above in HPI ? ?Physical Exam: ?BP 95/63   Pulse 99   ?Constitutional:  Alert and oriented, No acute distress. ?HEENT: [place] AT, moist mucus membranes.  Trachea midline, no masses. ?Cardiovascular:  No clubbing, cyanosis, or edema. ?Respiratory: Normal respiratory effort, no increased work of breathing. ?GI: Abdomen is soft, nontender, nondistended, no abdominal masses ?GU: No CVA tenderness.  ?Lymph: No cervical or inguinal lymphadenopathy. ?Skin: No rashes, bruises or suspicious lesions. ?Neurologic: Grossly intact, no focal deficits, moving all 4 extremities. ?Psychiatric: Normal mood and affect. ? ?Laboratory Data: ?Lab Results  ?Component Value Date  ? WBC 10.5 05/15/2019  ? HGB 14.8 05/15/2019  ? HCT 46.4 05/15/2019  ? MCV 96.5 05/15/2019  ? PLT 263 05/15/2019  ? ? ?Lab Results  ?Component Value Date  ? CREATININE 1.20 11/29/2021  ? ? ?Lab Results  ?Component Value Date  ? PSA 9.7 (H) 05/19/2020  ? PSA 8.7 (H) 05/11/2020  ? ? ?Lab Results  ?Component Value Date  ? TESTOSTERONE 166 (L) 05/11/2020  ? ? ?Lab Results  ?Component Value Date  ? HGBA1C 6.7 12/05/2021  ? ? ?Urinalysis ?   ?Component Value Date/Time  ? APPEARANCEUR Clear 09/09/2021 1142  ? GLUCOSEU Negative 09/09/2021 1142  ? BILIRUBINUR Negative 09/09/2021 1142  ? PROTEINUR Negative 09/09/2021 1142  ? NITRITE Negative 09/09/2021 1142  ? LEUKOCYTESUR Negative 09/09/2021 1142  ? ? ?Lab Results  ?Component Value Date  ? LABMICR Comment 09/09/2021  ? ? ?Pertinent Imaging: ? ?Results for orders placed during the hospital encounter of 05/14/08 ? ?DG Abd 1 View ? ?Narrative ?Clinical Data: Left ureteral calculus. ? ?ABDOMEN - 1 VIEW ? ?Comparison: CT abdomen and pelvis 04/08/08. ? ?Findings: No unexpected calcifications are seen over the renal ?shadows, expected course of the ureters or urinary bladder. ?Specifically, the small left UVJ stone seen the patient's CT scan ?is not visible on this study.  Bowel gas pattern is normal.  No ?focal bony abnormality. ? ?IMPRESSION: ?Negative for plain film evidence of urinary tract calculi. ? ?Provider: Jennye Boroughs, [facility] [place] ? ?No results found for this or any previous visit. ? ?No results found for this  or any previous visit. ? ?No results found for this or any previous visit. ? ?No results found for this or any previous visit. ? ?No results found for this or any previous visit. ? ?No results found for this or any previous visit. ? ?No results found for this or any previous visit. ? ? ?Assessment & Plan:   ? ?1. Prostate cancer metastatic to intrapelvic lymph node ([place]) ?Schedule for PSMA PET. RTC 3-4 weeks ?- Urinalysis, Routine w reflex microscopic ?- leuprolide (6 Month) (ELIGARD) injection 45 mg ? ? ?No follow-ups on file. ? ?Nicolette Bang, MD ? ?[facility] ? ? ?

## 2022-03-10 NOTE — Patient Instructions (Signed)
Prostate Cancer °The prostate is a small gland that helps make semen. It is located below a man's bladder, in front of the rectum. Prostate cancer is when abnormal cells grow in this gland. °What are the causes? °The cause of this condition is not known. °What increases the risk? °Being age 71 or older. °Having a family history of prostate cancer. °Having a family history of cancer of the breasts or ovaries. °Having genes that are passed from parent to child (inherited). °Having Lynch syndrome. °African American men and men of African descent are diagnosed with prostate cancer at higher rates than other men. °What are the signs or symptoms? °Problems peeing (urinating). This may include: °A stream that is weak, or pee that stops and starts. °Trouble starting or stopping your pee. °Trouble emptying all of your pee. °Needing to pee more often, especially at night. °Blood in your pee or semen. °Pain in the: °Lower back. °Lower belly (abdomen). °Hips. °Trouble getting an erection. °Weakness or numbness in the legs or feet. °How is this treated? °Treatment for this condition depends on: °How much the cancer has spread. °Your age. °The kind of treatment you want. °Your health. °Treatments include: °Being watched. This is called observation. You will be tested from time to time, but you will not get treated. Tests are to make sure that the cancer is not growing. °Surgery. This may be done to: °Take out (remove) the prostate. °Freeze and kill cancer cells. °Radiation. This uses a strong beam of energy to kill cancer cells. °Chemotherapy. This uses medicines that stop cancer cells from increasing. This kills cancer cells and healthy cells. °Targeted therapy. This kills cancer cells only. Healthy cells are not affected. °Hormone treatment. This stops the body from making hormones that help the cancer cells grow. °Follow these instructions at home: °Lifestyle °Do not smoke or use any products that contain nicotine or tobacco.  If you need help quitting, ask your doctor. °Eat a healthy diet. °Treatment may affect your ability to have sex. If you have a partner, touch, hold, hug, and caress your partner to have intimate moments. °Get plenty of sleep. °Ask your doctor for help to find a support group for men with prostate cancer. °General instructions °Take over-the-counter and prescription medicines only as told by your doctor. °If you have to go to the hospital, let your cancer doctor (oncologist) know. °Keep all follow-up visits. °Where to find more information °American Cancer Society: www.cancer.org °American Society of Clinical Oncology: www.cancer.net °National Cancer Institute: www.cancer.gov °Contact a doctor if: °You have new or more trouble peeing. °You have new or more blood in your pee. °You have new or more pain in your hips, back, or chest. °Get help right away if: °You have weakness in your legs. °You lose feeling in your legs. °You cannot control your pee or your poop (stool). °You have chills or a fever. °Summary °The prostate is a male gland that helps make semen. °Prostate cancer is when abnormal cells grow in this gland. °Treatment includes doing surgery, using medicines, using strong beams of energy, or watching without treatment. °Ask your doctor for help to find a support group for men with prostate cancer. °Contact a doctor if you have problems peeing or have any new pain that you did not have before. °This information is not intended to replace advice given to you by your health care provider. Make sure you discuss any questions you have with your health care provider. °Document Revised: 03/02/2021 Document Reviewed: 03/02/2021 °Elsevier   Patient Education © 2022 Elsevier Inc. ° °

## 2022-03-11 LAB — PSA: Prostate Specific Ag, Serum: 4.4 ng/mL — ABNORMAL HIGH (ref 0.0–4.0)

## 2022-03-17 DIAGNOSIS — Z20822 Contact with and (suspected) exposure to covid-19: Secondary | ICD-10-CM | POA: Diagnosis not present

## 2022-03-30 ENCOUNTER — Ambulatory Visit (HOSPITAL_COMMUNITY)
Admission: RE | Admit: 2022-03-30 | Discharge: 2022-03-30 | Disposition: A | Discharge status: Home / Self Care | Payer: Medicare Other | Source: Ambulatory Visit | Attending: Urology | Admitting: Urology

## 2022-03-30 DIAGNOSIS — C775 Secondary and unspecified malignant neoplasm of intrapelvic lymph nodes: Secondary | ICD-10-CM | POA: Insufficient documentation

## 2022-03-30 DIAGNOSIS — C61 Malignant neoplasm of prostate: Secondary | ICD-10-CM | POA: Insufficient documentation

## 2022-03-30 MED ORDER — FLUDEOXYGLUCOSE F - 18 (FDG) INJECTION: 9.7900 | Freq: Once | INTRAVENOUS | Status: DC | PRN

## 2022-03-30 MED ORDER — PIFLIFOLASTAT F 18 (PYLARIFY) INJECTION
9.0000 | Freq: Once | INTRAVENOUS | Status: AC
  Administered 2022-03-30: 9.79 via INTRAVENOUS

## 2022-04-03 ENCOUNTER — Ambulatory Visit (INDEPENDENT_AMBULATORY_CARE_PROVIDER_SITE_OTHER): Payer: Medicare Other | Admitting: Urology

## 2022-04-03 ENCOUNTER — Encounter: Payer: Self-pay | Admitting: Urology

## 2022-04-03 VITALS — BP 116/70 | HR 84

## 2022-04-03 DIAGNOSIS — C775 Secondary and unspecified malignant neoplasm of intrapelvic lymph nodes: Secondary | ICD-10-CM

## 2022-04-03 DIAGNOSIS — N401 Enlarged prostate with lower urinary tract symptoms: Secondary | ICD-10-CM

## 2022-04-03 DIAGNOSIS — N138 Other obstructive and reflux uropathy: Secondary | ICD-10-CM | POA: Diagnosis not present

## 2022-04-03 DIAGNOSIS — R351 Nocturia: Secondary | ICD-10-CM | POA: Diagnosis not present

## 2022-04-03 DIAGNOSIS — C61 Malignant neoplasm of prostate: Secondary | ICD-10-CM

## 2022-04-03 DIAGNOSIS — I2510 Atherosclerotic heart disease of native coronary artery without angina pectoris: Secondary | ICD-10-CM

## 2022-04-03 DIAGNOSIS — Z20822 Contact with and (suspected) exposure to covid-19: Secondary | ICD-10-CM | POA: Diagnosis not present

## 2022-04-03 MED ORDER — ENZALUTAMIDE 80 MG PO TABS: 160.0000 mg | ORAL_TABLET | Freq: Every day | ORAL | 0 refills | Status: DC

## 2022-04-03 MED ORDER — AMOXICILLIN-POT CLAVULANATE 875-125 MG PO TABS: 1.0000 | ORAL_TABLET | Freq: Two times a day (BID) | ORAL | 0 refills | Status: DC

## 2022-04-03 NOTE — Progress Notes (Signed)
? ?04/03/2022 ?2:09 PM  ? ?Stephen Palmer. ?09-20-1951 ?[id number] ? ?Referring provider: [name], PA-C ?[address] ?[place],  [place] [address] ? ?Followup Prostate cancer ? ? ?HPI: ?[name] is a 71yo here for followup for castrate resistant prostate cancer. PSMA PET shows progression of his nodal metastasis. PSA 4.4 on ADT. He denies any bone pain. He denies any significant LUTS. No other complaints today. ? ? ?PMH: ?Past Medical History:  ?Diagnosis Date  ? Abdominal aortic aneurysm (AAA)   ? Anxiety   ? COPD (chronic obstructive pulmonary disease) ([place])   ? Coronary artery calcification seen on CT scan   ? Essential hypertension   ? Lung cancer ([place])   ? Non small cell carcinoma - XRT  ? Prostate cancer ([place])   ? XRT  ? Stroke [facility])   ? Type 2 diabetes mellitus ([place])   ? ? ?Surgical History: ?Past Surgical History:  ?Procedure Laterality Date  ? COLONOSCOPY N/A 11/23/2015  ? Procedure: COLONOSCOPY;  Surgeon: Aviva Signs, MD;  [facility];  Service: Gastroenterology;  Laterality: N/A;  ? FRACTURE SURGERY    ? PROSTATE BIOPSY    ? SINUS SURGERY WITH INSTATRAK    ? ? ?Home Medications:  ?Allergies as of 04/03/2022   ? ?   Reactions  ? Sulfa Antibiotics Hives, Itching, Swelling  ? Tongue swelling  ? Sulfasalazine Hives, Itching, Swelling  ? Tongue swelling  ? Lipitor [atorvastatin] Other (See Comments)  ? myalgia  ? Simvastatin Other (See Comments)  ? myalgia  ? ?  ? ?  ?Medication List  ?  ? ?  ? Accurate as of April 03, 2022  2:09 PM. If you have any questions, ask your nurse or doctor.  ?  ?  ? ?  ? ?albuterol (2.5 MG/3ML) 0.083% nebulizer solution ?Commonly known as: PROVENTIL ?Take 2.5 mg by nebulization every 6 (six) hours as needed. ?  ?alprazolam 2 MG tablet ?Commonly known as: Duanne Moron ?Take 2 mg by mouth 2 (two) times daily. ?  ?calcium-vitamin D 500-200 MG-UNIT tablet ?Commonly known as: OSCAL WITH D ?Take 1 tablet by mouth. ?  ?citalopram 40 MG tablet ?Commonly known as:  CELEXA ?Take 20-40 mg by mouth daily. ?  ?cyclobenzaprine 10 MG tablet ?Commonly known as: FLEXERIL ?Take 10 mg by mouth 3 (three) times daily as needed. ?  ?DULoxetine 30 MG capsule ?Commonly known as: CYMBALTA ?Take 30 mg by mouth daily. ?  ?glipiZIDE 5 MG tablet ?Commonly known as: GLUCOTROL ?Take 1 tablet (5 mg total) by mouth 2 (two) times daily before a meal. ?  ?milk thistle 175 MG tablet ?Take 175 mg by mouth daily. ?  ?rosuvastatin 10 MG tablet ?Commonly known as: CRESTOR ?Take 1 tablet (10 mg total) by mouth daily. ?  ?tamsulosin 0.4 MG Caps capsule ?Commonly known as: FLOMAX ?Take 1 capsule (0.4 mg total) by mouth in the morning and at bedtime. ?  ?vitamin C 500 MG tablet ?Commonly known as: ASCORBIC ACID ?Take 500 mg by mouth daily. ?  ?Vitamin D 50 MCG (2000 UT) Caps ?Take by mouth daily. ?  ?vitamin E 1000 UNIT capsule ?Take 1,000 Units by mouth daily. ?  ?zinc gluconate 50 MG tablet ?Take 50 mg by mouth daily. ?  ? ?  ? ? ?Allergies:  ?Allergies  ?Allergen Reactions  ? Sulfa Antibiotics Hives, Itching and Swelling  ?  Tongue swelling  ? Sulfasalazine Hives, Itching and Swelling  ?  Tongue swelling  ?  Lipitor [Atorvastatin] Other (See Comments)  ?  myalgia  ? Simvastatin Other (See Comments)  ?  myalgia  ? ? ?Family History: ?Family History  ?Problem Relation Age of Onset  ? Heart disease Father   ?     before age 25  ? Heart disease Mother   ? Cancer Neg Hx   ? ? ?Social History:  reports that he has been smoking cigarettes. He has a 88.00 pack-year smoking history. He has never used smokeless tobacco. He reports that he does not drink alcohol and does not use drugs. ? ?ROS: ?All other review of systems were reviewed and are negative except what is noted above in HPI ? ?Physical Exam: ?BP 116/70   Pulse 84   ?Constitutional:  Alert and oriented, No acute distress. ?HEENT: [place] AT, moist mucus membranes.  Trachea midline, no masses. ?Cardiovascular: No clubbing, cyanosis, or edema. ?Respiratory: Normal  respiratory effort, no increased work of breathing. ?GI: Abdomen is soft, nontender, nondistended, no abdominal masses ?GU: No CVA tenderness.  ?Lymph: No cervical or inguinal lymphadenopathy. ?Skin: No rashes, bruises or suspicious lesions. ?Neurologic: Grossly intact, no focal deficits, moving all 4 extremities. ?Psychiatric: Normal mood and affect. ? ?Laboratory Data: ?Lab Results  ?Component Value Date  ? WBC 10.5 05/15/2019  ? HGB 14.8 05/15/2019  ? HCT 46.4 05/15/2019  ? MCV 96.5 05/15/2019  ? PLT 263 05/15/2019  ? ? ?Lab Results  ?Component Value Date  ? CREATININE 1.20 11/29/2021  ? ? ?Lab Results  ?Component Value Date  ? PSA 9.7 (H) 05/19/2020  ? PSA 8.7 (H) 05/11/2020  ? ? ?Lab Results  ?Component Value Date  ? TESTOSTERONE 166 (L) 05/11/2020  ? ? ?Lab Results  ?Component Value Date  ? HGBA1C 6.7 12/05/2021  ? ? ?Urinalysis ?   ?Component Value Date/Time  ? APPEARANCEUR Clear 03/10/2022 1229  ? GLUCOSEU Negative 03/10/2022 1229  ? BILIRUBINUR Negative 03/10/2022 1229  ? PROTEINUR Negative 03/10/2022 1229  ? NITRITE Negative 03/10/2022 1229  ? LEUKOCYTESUR Negative 03/10/2022 1229  ? ? ?Lab Results  ?Component Value Date  ? LABMICR Comment 03/10/2022  ? ? ?Pertinent Imaging: ?PSMA PET: Images reviewed and discussed with the patient  ?Results for orders placed during the hospital encounter of 05/14/08 ? ?DG Abd 1 View ? ?Narrative ?Clinical Data: Left ureteral calculus. ? ?ABDOMEN - 1 VIEW ? ?Comparison: CT abdomen and pelvis 04/08/08. ? ?Findings: No unexpected calcifications are seen over the renal ?shadows, expected course of the ureters or urinary bladder. ?Specifically, the small left UVJ stone seen the patient's CT scan ?is not visible on this study.  Bowel gas pattern is normal.  No ?focal bony abnormality. ? ?IMPRESSION: ?Negative for plain film evidence of urinary tract calculi. ? ?Provider: Jennye Boroughs, Iz[facility] Gala [place] ? ?No results found for this or any previous visit. ? ?No results found for  this or any previous visit. ? ?No results found for this or any previous visit. ? ?No results found for this or any previous visit. ? ?No results found for this or any previous visit. ? ?No results found for this or any previous visit. ? ?No results found for this or any previous visit. ? ? ?Assessment & Plan:   ? ?1. Prostate cancer metastatic to intrapelvic lymph node ([place]) ?We discussed the management of metastatic castrate resistant prostate cancer with the patient including surveillance with continued ADT therapy, xtandi, erleada, abiraterone, docetaxel. After discussing the treatment options the patient elects for xtandi.  ?- Urinalysis,  Routine w reflex microscopic ? ?2. Nocturia ?Continue flomax BID ? ?3. Benign prostatic hyperplasia with urinary obstruction ?Continue flomax BID ? ? ?No follow-ups on file. ? ?Nicolette Bang, MD ? ?[facility] ?  ?

## 2022-04-03 NOTE — Patient Instructions (Signed)

## 2022-04-04 LAB — URINALYSIS, ROUTINE W REFLEX MICROSCOPIC
Bilirubin, UA: NEGATIVE
Glucose, UA: NEGATIVE
Ketones, UA: NEGATIVE
Leukocytes,UA: NEGATIVE
Nitrite, UA: NEGATIVE
Protein,UA: NEGATIVE
RBC, UA: NEGATIVE
Specific Gravity, UA: 1.01 (ref 1.005–1.030)
Urobilinogen, Ur: 0.2 mg/dL (ref 0.2–1.0)
pH, UA: 6 (ref 5.0–7.5)

## 2022-04-13 DIAGNOSIS — Z20822 Contact with and (suspected) exposure to covid-19: Secondary | ICD-10-CM | POA: Diagnosis not present

## 2022-04-24 ENCOUNTER — Encounter: Payer: Self-pay | Admitting: Urology

## 2022-04-24 DIAGNOSIS — Z20822 Contact with and (suspected) exposure to covid-19: Secondary | ICD-10-CM | POA: Diagnosis not present

## 2022-04-24 NOTE — Progress Notes (Signed)
Telephone appointment. I verified patient identity and began nursing interview. Patient reports occasional nocturia x1-2, but overall doing well. Patient has refused his CT of chest w/ contrast but would still like to discuss his care during his 04/26/22 telephone appointment. I notified Ashlyn Bruning PA-C & Dr. Tammi Klippel of patient's refusal and Dr. Tammi Klippel states... "He does not need CT. His PSMA PET last month shows the treated nodule is stable at 8 mm.  On a different note, the PET shows his prostate cancer recurrence in pelvic and para-aortic lymph nodes.  We may be pivoting to discuss radiation to those." I notified patient of this response.  ? ?Meaningful use complete. ?Flomax as directed. ?I-PSS score of 1 (mild). ?Urology appointment 05/01/22. ? ?I also reminded patient of his 10:00am-04/26/22 telephone appointment w/ Ashlyn Bruning PA-C. I left my extension [phone] in case patient needs anything. Patient verbalized understanding of the conversation. ? ?Patient contact [phone] ?

## 2022-04-26 ENCOUNTER — Ambulatory Visit
Admission: RE | Admit: 2022-04-26 | Discharge: 2022-04-26 | Disposition: A | Discharge status: Home / Self Care | Payer: Medicare Other | Source: Ambulatory Visit | Attending: Urology | Admitting: Urology

## 2022-04-26 ENCOUNTER — Encounter: Payer: Self-pay | Admitting: Urology

## 2022-04-26 DIAGNOSIS — R911 Solitary pulmonary nodule: Secondary | ICD-10-CM

## 2022-04-26 DIAGNOSIS — Z192 Hormone resistant malignancy status: Secondary | ICD-10-CM | POA: Diagnosis not present

## 2022-04-26 DIAGNOSIS — C3492 Malignant neoplasm of unspecified part of left bronchus or lung: Secondary | ICD-10-CM

## 2022-04-26 DIAGNOSIS — C3412 Malignant neoplasm of upper lobe, left bronchus or lung: Secondary | ICD-10-CM | POA: Diagnosis not present

## 2022-04-26 DIAGNOSIS — F17200 Nicotine dependence, unspecified, uncomplicated: Secondary | ICD-10-CM

## 2022-04-26 DIAGNOSIS — C61 Malignant neoplasm of prostate: Secondary | ICD-10-CM

## 2022-04-26 NOTE — Progress Notes (Signed)
?Radiation Oncology         [phone] ?________________________________ ? ?Name: Stephen Palmer. MRN: [id number]  ?Date: 04/26/2022  DOB: 06/21/1951 ? ?Post Treatment Note- Conducted via telephone due to current COVID-19 concerns for limiting patient exposure ? ?CC: Stephen Belfast, MD ? ?Diagnosis:    70 y.o. male with h/o prostate cancer and a 3 cm squamous cell carcinoma of the left upper lobe of the lung - Stage IA    ? ?Interval Since Last Radiation:  4 year, 7 months  ?Curative, Definitive SBRT:  10/05/2017, 10/09/2017, 10/12/2017- The target was treated to 54 Gy in 3 fractions of 18 Gy. ? ?12/13/16 - 02/09/17: Prostate IMRT ; 40 fractions to 78 Gy in combination with LT-ADT under the care of Dr. Lianne Cure in [place], [place]. ? ?Narrative:  I spoke with the patient to conduct his routine scheduled follow up visit via telephone to spare the patient unnecessary potential exposure in the healthcare setting during the current COVID-19 pandemic.  The patient was notified in advance and gave permission to proceed with this visit format.  ? ?He has recovered well from the effects of radiotherapy and remains without complaints.  Unfortunately, he has had a rising PSA since  03/03/22 when his PSA increased from 0.3 in 08/2021 to 3.9 and remained elevated at 4.4 on repeat labs 03/10/22, despite ADT. This prompted further evaluation with a PSMA PET scan which was performed 03/30/22 and shows tracer avid nodes within the abdominal retroperitoneum, right pelvis, and left low jugular/supraclavicular stations, most consistent with nodal metastasis. The low left jugular/supraclavicular nodes are favored to be from prostate primary while nodal metastasis from the patient's lung primary is felt less likely. There is no evidence of local recurrence or osseous metastasis identified. He was started on Xtandi, in addition to the Lupron ADT in 03/2022, under the care and direction of Dr. Alyson Ingles. He is experiencing  significant fatigue/malaise since starting the [facility] but otherwise feels he is tolerating this fairly well. He is scheduled for a follow up visit with repeat PSA on 05/01/22 and an office follow up with Dr. Alyson Ingles on 05/03/22. In reviewing his PSMA PET to evaluate for any evidence of lung cancer recurrence, we noted the prostate cancer recurrence in the pelvic and retroperitoneal nodes and in reviewing the prior prostate IMRT treatment plans from [facility], it was noted that the pelvic nodes were not included in the original treatment. Therefore, we could consider a 10-fraction course of SBRT to the pelvic and retroperitoneal nodes to boost local control and help with PSA response. This was discussed with the patient today. ? ?He also continues in routine follow-up with his vascular specialist, Dr. Scot Dock, and more recently was referred for consult with Dr. Wilhelmenia Blase at [facility] for monitoring of the known infrarenal AAA which has fortunately, remained radiographically stable.  ?                 ?On review of systems, the patient states that he is doing fairly well overall despite the increased fatigue/malaise since starting Xtandi in 03/2022. He continues with a mild, occasional productive cough with clear to whitish sputum. He will occasionally have harsh coughing episodes that make it difficult to catch his breath and at times are painful.  He has continued using his inhalers as prescribed by his PCP but wonders if he should see a lung specialist.  He specifically denies hemoptysis, increased shortness of breath, chest pain, fevers, chills or  night sweats.  He continues with mild fatigue which is not progressively worseining.  He admits to having a very sedentary lifestyle despite encouragement from his wife and endocrinologist to be more active.  He denies abdominal pain, N/V or diarrhea.  He has a healthy appetite and is maintaining his weight. He had a stroke in 2020 which he has pretty much recovered from  fully.   ? ?ALLERGIES:  is allergic to sulfa antibiotics, sulfasalazine, lipitor [atorvastatin], and simvastatin. ? ?Meds: ?Current Outpatient Medications  ?Medication Sig Dispense Refill  ? albuterol (PROVENTIL) (2.5 MG/3ML) 0.083% nebulizer solution Take 2.5 mg by nebulization every 6 (six) hours as needed.    ? alprazolam (XANAX) 2 MG tablet Take 2 mg by mouth 2 (two) times daily.    ? Cholecalciferol (VITAMIN D) 50 MCG (2000 UT) CAPS Take by mouth daily.    ? citalopram (CELEXA) 40 MG tablet Take 20-40 mg by mouth daily.    ? glipiZIDE (GLUCOTROL) 5 MG tablet Take 1 tablet (5 mg total) by mouth 2 (two) times daily before a meal. 180 tablet 1  ? milk thistle 175 MG tablet Take 175 mg by mouth daily.    ? rosuvastatin (CRESTOR) 10 MG tablet Take 1 tablet (10 mg total) by mouth daily. 90 tablet 3  ? tamsulosin (FLOMAX) 0.4 MG CAPS capsule Take 1 capsule (0.4 mg total) by mouth in the morning and at bedtime. 60 capsule 11  ? vitamin C (ASCORBIC ACID) 500 MG tablet Take 500 mg by mouth daily.    ? vitamin E 1000 UNIT capsule Take 1,000 Units by mouth daily.    ? zinc gluconate 50 MG tablet Take 50 mg by mouth daily.    ? amoxicillin-clavulanate (AUGMENTIN) 875-125 MG tablet Take 1 tablet by mouth every 12 (twelve) hours. (Patient not taking: Reported on 04/24/2022) 14 tablet 0  ? calcium-vitamin D (OSCAL WITH D) 500-200 MG-UNIT tablet Take 1 tablet by mouth. (Patient not taking: Reported on 04/03/2022)    ? cyclobenzaprine (FLEXERIL) 10 MG tablet Take 10 mg by mouth 3 (three) times daily as needed.  (Patient not taking: Reported on 04/24/2022)    ? DULoxetine (CYMBALTA) 30 MG capsule Take 30 mg by mouth daily. (Patient not taking: Reported on 04/03/2022)    ? enzalutamide (XTANDI) 80 MG tablet Take 2 tablets (160 mg total) by mouth daily. 60 tablet 0  ? ?No current facility-administered medications for this encounter.  ? ? ?Physical Findings: ? vitals were not taken for this visit.  ?Pain Assessment ?Pain Score: 3 Unable  to assess due to telephone follow-up visit format. ? ?Lab Findings: ?Lab Results  ?Component Value Date  ? WBC 10.5 05/15/2019  ? HGB 14.8 05/15/2019  ? HCT 46.4 05/15/2019  ? MCV 96.5 05/15/2019  ? PLT 263 05/15/2019  ? ? ? ?Radiographic Findings: ?NM PET (PSMA) SKULL TO MID THIGH ? ?Result Date: 04/03/2022 ?CLINICAL DATA:  Prostate cancer. Recurrence. Metastatic to intrapelvic lymph nodes. Biopsy in 2017. History of non-small-cell lung cancer, stage I with radiation therapy 3 years ago. EXAM: NUCLEAR MEDICINE PET SKULL BASE TO THIGH TECHNIQUE: 9.8 mCi F18 Piflufolastat (Pylarify) was injected intravenously. Full-ring PET imaging was performed from the skull base to thigh after the radiotracer. CT data was obtained and used for attenuation correction and anatomic localization. COMPARISON:  06/16/2020 bone scan. 04/12/2020 CTA of the abdomen and pelvis. CT chest of 10/21/2021 from [facility] FINDINGS: NECK Small low left jugular/supraclavicular nodes with tracer affinity. Example 6 mm  and a S.U.V. max of 3.1 on 71/3. Incidental CT finding: No cervical adenopathy. Bilateral carotid atherosclerosis. Mucosal thickening of maxillary sinuses, ethmoid air cells, frontal sinuses. CHEST No radiotracer accumulation within mediastinal or hilar lymph nodes. Low-level tracer affinity corresponding to left chest wall soft tissue thickening and third and fourth rib osseous irregularity, likely radiation induced. Incidental CT finding: Aortic atherosclerosis. Normal heart size, without pericardial effusion. Three vessel coronary artery calcification. Radiation fibrosis within the left upper lobe with an underlying treated primary which is relatively similar at 8 mm on 106/3. Moderate centrilobular and paraseptal emphysema. ABDOMEN/PELVIS Prostate: No focal activity in the prostate bed. Lymph nodes: Small tracer avid abdominal retroperitoneal nodes. Example right retroaortic node of 6 mm and a S.U.V. max of 3.7 on 199/3. More  caudal left retroaortic 7 mm node measures a S.U.V. max of 3.2 on 207/3. Right common and external iliac tracer avid nodes. Index node in the right external iliac station measures 1.3 cm and a S.U.V. max

## 2022-05-01 ENCOUNTER — Encounter: Payer: Self-pay | Admitting: Urology

## 2022-05-01 ENCOUNTER — Ambulatory Visit (INDEPENDENT_AMBULATORY_CARE_PROVIDER_SITE_OTHER): Payer: Medicare Other | Admitting: Urology

## 2022-05-01 VITALS — BP 150/78 | HR 89

## 2022-05-01 DIAGNOSIS — I2510 Atherosclerotic heart disease of native coronary artery without angina pectoris: Secondary | ICD-10-CM | POA: Diagnosis not present

## 2022-05-01 DIAGNOSIS — N138 Other obstructive and reflux uropathy: Secondary | ICD-10-CM | POA: Diagnosis not present

## 2022-05-01 DIAGNOSIS — N401 Enlarged prostate with lower urinary tract symptoms: Secondary | ICD-10-CM | POA: Diagnosis not present

## 2022-05-01 DIAGNOSIS — R351 Nocturia: Secondary | ICD-10-CM

## 2022-05-01 DIAGNOSIS — C775 Secondary and unspecified malignant neoplasm of intrapelvic lymph nodes: Secondary | ICD-10-CM | POA: Diagnosis not present

## 2022-05-01 DIAGNOSIS — C61 Malignant neoplasm of prostate: Secondary | ICD-10-CM

## 2022-05-01 LAB — URINALYSIS, ROUTINE W REFLEX MICROSCOPIC
Bilirubin, UA: NEGATIVE
Glucose, UA: NEGATIVE
Ketones, UA: NEGATIVE
Leukocytes,UA: NEGATIVE
Nitrite, UA: NEGATIVE
Protein,UA: NEGATIVE
RBC, UA: NEGATIVE
Specific Gravity, UA: 1.015 (ref 1.005–1.030)
Urobilinogen, Ur: 0.2 mg/dL (ref 0.2–1.0)
pH, UA: 6 (ref 5.0–7.5)

## 2022-05-01 MED ORDER — ERLEADA 60 MG PO TABS: 240.0000 mg | ORAL_TABLET | Freq: Every day | ORAL | 11 refills | Status: DC

## 2022-05-01 NOTE — Patient Instructions (Signed)

## 2022-05-01 NOTE — Progress Notes (Signed)
? ?05/01/2022 ?11:03 AM  ? ?[name] ?11-01-1951 ?[id number] ? ?Referring provider: [name], PA-C ?[address] ?[place],  [place] [address] ? ?Followup prostate cancer and nocturia ? ? ?HPI: ?Mr Neyhart is a 71yo here for followup for metastatic prostate cancer and nocturia. IPSS 11 QOL 3 on flomax BID. He is currently on xtandi 176m daily. Since starting the medication he has had extreme fatigue and he has no appetite.He is unhappy on the medication. No recent PSA. He met with Dr. MJohny Shearsoffice and he is likely a candidate for pelvic radiation. No other complaints today ? ? ?PMH: ?Past Medical History:  ?Diagnosis Date  ? Abdominal aortic aneurysm (AAA) ([facility]   ? Anxiety   ? COPD (chronic obstructive pulmonary disease) ([facility]   ? Coronary artery calcification seen on CT scan   ? Essential hypertension   ? Lung cancer ([place]   ? Non small cell carcinoma - XRT  ? Non-small cell carcinoma of left lung, stage 1 ([facility] 09/21/2017  ? Prostate cancer ([facility]   ? XRT  ? Stroke ([facility]   ? Type 2 diabetes mellitus ([place] [place]   ? ? ?Surgical History: ?Past Surgical History:  ?Procedure Laterality Date  ? COLONOSCOPY N/A 11/23/2015  ? Procedure: COLONOSCOPY;  Surgeon: MAviva Signs MD;  Location: [facility];  Service: Gastroenterology;  Laterality: N/A;  ? FRACTURE SURGERY    ? PROSTATE BIOPSY    ? SINUS SURGERY WITH INSTATRAK    ? ? ?Home Medications:  ?Allergies as of 05/01/2022   ? ?   Reactions  ? Sulfa Antibiotics Hives, Itching, Swelling  ? Tongue swelling  ? Sulfasalazine Hives, Itching, Swelling  ? Tongue swelling  ? Lipitor [atorvastatin] Other (See Comments)  ? myalgia  ? Simvastatin Other (See Comments)  ? myalgia  ? ?  ? ?  ?Medication List  ?  ? ?  ? Accurate as of May 01, 2022 11:03 AM. If you have any questions, ask your nurse or doctor.  ?  ?  ? ?  ? ?albuterol (2.5 MG/3ML) 0.083% nebulizer solution ?Commonly known as: PROVENTIL ?Take 2.5 mg by nebulization every 6 (six) hours as needed. ?   ?alprazolam 2 MG tablet ?Commonly known as: XDuanne Moron?Take 2 mg by mouth 2 (two) times daily. ?  ?amoxicillin-clavulanate 875-125 MG tablet ?Commonly known as: AUGMENTIN ?Take 1 tablet by mouth every 12 (twelve) hours. ?  ?calcium-vitamin D 500-200 MG-UNIT tablet ?Commonly known as: OSCAL WITH D ?Take 1 tablet by mouth. ?  ?citalopram 40 MG tablet ?Commonly known as: CELEXA ?Take 20-40 mg by mouth daily. ?  ?cyclobenzaprine 10 MG tablet ?Commonly known as: FLEXERIL ?Take 10 mg by mouth 3 (three) times daily as needed. ?  ?DULoxetine 30 MG capsule ?Commonly known as: CYMBALTA ?Take 30 mg by mouth daily. ?  ?enzalutamide 80 MG tablet ?Commonly known as: Xtandi ?Take 2 tablets (160 mg total) by mouth daily. ?  ?glipiZIDE 5 MG tablet ?Commonly known as: GLUCOTROL ?Take 1 tablet (5 mg total) by mouth 2 (two) times daily before a meal. ?  ?milk thistle 175 MG tablet ?Take 240 mg by mouth daily. ?  ?rosuvastatin 10 MG tablet ?Commonly known as: CRESTOR ?Take 1 tablet (10 mg total) by mouth daily. ?  ?tamsulosin 0.4 MG Caps capsule ?Commonly known as: FLOMAX ?Take 1 capsule (0.4 mg total) by mouth in the morning and at bedtime. ?  ?vitamin C 500 MG tablet ?Commonly known as: ASCORBIC ACID ?Take 1,000  mg by mouth daily. ?  ?Vitamin D 50 MCG (2000 UT) Caps ?Take by mouth daily. ?  ?vitamin E 1000 UNIT capsule ?Take 180 Units by mouth daily. ?  ?zinc gluconate 50 MG tablet ?Take 50 mg by mouth daily. ?  ? ?  ? ? ?Allergies:  ?Allergies  ?Allergen Reactions  ? Sulfa Antibiotics Hives, Itching and Swelling  ?  Tongue swelling  ? Sulfasalazine Hives, Itching and Swelling  ?  Tongue swelling  ? Lipitor [Atorvastatin] Other (See Comments)  ?  myalgia  ? Simvastatin Other (See Comments)  ?  myalgia  ? ? ?Family History: ?Family History  ?Problem Relation Age of Onset  ? Heart disease Father   ?     before age 73  ? Heart disease Mother   ? Cancer Neg Hx   ? ? ?Social History:  reports that he has been smoking cigarettes. He has a  88.00 pack-year smoking history. He has never used smokeless tobacco. He reports that he does not drink alcohol and does not use drugs. ? ?ROS: ?All other review of systems were reviewed and are negative except what is noted above in HPI ? ?Physical Exam: ?BP (!) 150/78   Pulse 89   ?Constitutional:  Alert and oriented, No acute distress. ?HEENT: [place] AT, moist mucus membranes.  Trachea midline, no masses. ?Cardiovascular: No clubbing, cyanosis, or edema. ?Respiratory: Normal respiratory effort, no increased work of breathing. ?GI: Abdomen is soft, nontender, nondistended, no abdominal masses ?GU: No CVA tenderness.  ?Lymph: No cervical or inguinal lymphadenopathy. ?Skin: No rashes, bruises or suspicious lesions. ?Neurologic: Grossly intact, no focal deficits, moving all 4 extremities. ?Psychiatric: Normal mood and affect. ? ?Laboratory Data: ?Lab Results  ?Component Value Date  ? WBC 10.5 05/15/2019  ? HGB 14.8 05/15/2019  ? HCT 46.4 05/15/2019  ? MCV 96.5 05/15/2019  ? PLT 263 05/15/2019  ? ? ?Lab Results  ?Component Value Date  ? CREATININE 1.20 11/29/2021  ? ? ?Lab Results  ?Component Value Date  ? PSA 9.7 (H) 05/19/2020  ? PSA 8.7 (H) 05/11/2020  ? ? ?Lab Results  ?Component Value Date  ? TESTOSTERONE 166 (L) 05/11/2020  ? ? ?Lab Results  ?Component Value Date  ? HGBA1C 6.7 12/05/2021  ? ? ?Urinalysis ?   ?Component Value Date/Time  ? APPEARANCEUR Clear 04/03/2022 1343  ? GLUCOSEU Negative 04/03/2022 1343  ? BILIRUBINUR Negative 04/03/2022 1343  ? PROTEINUR Negative 04/03/2022 1343  ? NITRITE Negative 04/03/2022 1343  ? LEUKOCYTESUR Negative 04/03/2022 1343  ? ? ?Lab Results  ?Component Value Date  ? LABMICR Comment 04/03/2022  ? ? ?Pertinent Imaging: ? ?Results for orders placed during the hospital encounter of 05/14/08 ? ?DG Abd 1 View ? ?Narrative ?Clinical Data: Left ureteral calculus. ? ?ABDOMEN - 1 VIEW ? ?Comparison: CT abdomen and pelvis 04/08/08. ? ?Findings: No unexpected calcifications are seen over  the renal ?shadows, expected course of the ureters or urinary bladder. ?Specifically, the small left UVJ stone seen the patient's CT scan ?is not visible on this study.  Bowel gas pattern is normal.  No ?focal bony abnormality. ? ?IMPRESSION: ?Negative for plain film evidence of urinary tract calculi. ? ?Provider: Jennye Boroughs, Izora Gala [place] ? ?No results found for this or any previous visit. ? ?No results found for this or any previous visit. ? ?No results found for this or any previous visit. ? ?No results found for this or any previous visit. ? ?No results found for this or any  previous visit. ? ?No results found for this or any previous visit. ? ?No results found for this or any previous visit. ? ? ?Assessment & Plan:   ? ?1. Prostate cancer metastatic to intrapelvic lymph node ([place]) ?-We will discontinue xtandi and start ereada 2151m daily ?- Urinalysis, Routine w reflex microscopic ? ?2. Benign prostatic hyperplasia with urinary obstruction ?Continue flomax 0.463mBID ? ?3. Nocturia ?-continue flomax 0.51m59mID ? ? ?No follow-ups on file. ? ?PatNicolette BangD ? ?[facility] [place] ?

## 2022-05-02 LAB — PSA: Prostate Specific Ag, Serum: 1.4 ng/mL (ref 0.0–4.0)

## 2022-05-09 ENCOUNTER — Telehealth: Payer: Self-pay

## 2022-05-09 ENCOUNTER — Inpatient Hospital Stay (HOSPITAL_COMMUNITY): Payer: Medicare Other | Attending: Hematology | Admitting: Hematology

## 2022-05-09 VITALS — BP 110/71 | HR 78 | Temp 98.0°F | Resp 18 | Ht 70.0 in | Wt 164.1 lb

## 2022-05-09 DIAGNOSIS — Z85118 Personal history of other malignant neoplasm of bronchus and lung: Secondary | ICD-10-CM | POA: Diagnosis not present

## 2022-05-09 DIAGNOSIS — Z192 Hormone resistant malignancy status: Secondary | ICD-10-CM | POA: Diagnosis not present

## 2022-05-09 DIAGNOSIS — C775 Secondary and unspecified malignant neoplasm of intrapelvic lymph nodes: Secondary | ICD-10-CM

## 2022-05-09 DIAGNOSIS — C771 Secondary and unspecified malignant neoplasm of intrathoracic lymph nodes: Secondary | ICD-10-CM | POA: Diagnosis not present

## 2022-05-09 DIAGNOSIS — Z79899 Other long term (current) drug therapy: Secondary | ICD-10-CM | POA: Insufficient documentation

## 2022-05-09 DIAGNOSIS — M818 Other osteoporosis without current pathological fracture: Secondary | ICD-10-CM

## 2022-05-09 DIAGNOSIS — C61 Malignant neoplasm of prostate: Secondary | ICD-10-CM | POA: Diagnosis not present

## 2022-05-09 DIAGNOSIS — I10 Essential (primary) hypertension: Secondary | ICD-10-CM | POA: Diagnosis not present

## 2022-05-09 DIAGNOSIS — E119 Type 2 diabetes mellitus without complications: Secondary | ICD-10-CM | POA: Insufficient documentation

## 2022-05-09 DIAGNOSIS — Z8546 Personal history of malignant neoplasm of prostate: Secondary | ICD-10-CM | POA: Insufficient documentation

## 2022-05-09 DIAGNOSIS — F419 Anxiety disorder, unspecified: Secondary | ICD-10-CM | POA: Diagnosis not present

## 2022-05-09 DIAGNOSIS — Z7984 Long term (current) use of oral hypoglycemic drugs: Secondary | ICD-10-CM | POA: Insufficient documentation

## 2022-05-09 DIAGNOSIS — C772 Secondary and unspecified malignant neoplasm of intra-abdominal lymph nodes: Secondary | ICD-10-CM | POA: Diagnosis not present

## 2022-05-09 DIAGNOSIS — I2510 Atherosclerotic heart disease of native coronary artery without angina pectoris: Secondary | ICD-10-CM | POA: Diagnosis not present

## 2022-05-09 DIAGNOSIS — J449 Chronic obstructive pulmonary disease, unspecified: Secondary | ICD-10-CM | POA: Insufficient documentation

## 2022-05-09 DIAGNOSIS — F17210 Nicotine dependence, cigarettes, uncomplicated: Secondary | ICD-10-CM | POA: Diagnosis not present

## 2022-05-09 NOTE — Patient Instructions (Addendum)
[place] at [facility] Discharge Instructions  You were seen and examined today by Dr. Delton Coombes. Dr. Delton Coombes is a medical oncologist, meaning that he specializes in the treatment of cancer diagnoses. Dr. Delton Coombes discussed your past medical history, family history of cancers, and the events that led to you being here today.  You were referred to Dr. Delton Coombes by [facility] due to an abnormal PET scan. The PET scan revealed enlarged lymph nodes present. This is concerning for recurrence of you known prostate cancer, because it showed up on the prostate specific PET scan. It is unlikely to be related to your previous lung cancer.  Dr. Delton Coombes has recommended we find the right pill for you to be on for your prostate cancer, since you are unable to tolerate Gillermina Phy and Meno. Lupron injections alone is not helping to control the prostate cancer. He discussed starting you on a pill called Zytiga. It is fairly well tolerated by most patients. You will need to be on a pill in order to help control the lymph nodes that are in your neck.   You can plan to start SBRT (radiation) to the lymph nodes in your pelvis to help get the cancer and PSA under control, then we can regroup to discuss starting on the Zytiga. We don't want to do radiation and start a new pill at the same time due to your fatigue.  We will need to arrange for you to have a bone density test as well.   We will arrange for you to have a genetics appointment.   Follow-up as scheduled.   Thank you for choosing [place] at [facility] to provide your oncology and hematology care.  To afford each patient quality time with our provider, please arrive at least 15 minutes before your scheduled appointment time.   If you have a lab appointment with the [place] [facility] please come in thru the Main Entrance and check in at the main information desk.  You need to re-schedule your  appointment should you arrive 10 or more minutes late.  We strive to give you quality time with our providers, and arriving late affects you and other patients whose appointments are after yours.  Also, if you no show three or more times for appointments you may be dismissed from the clinic at the providers discretion.     Again, thank you for choosing [facility].  Our hope is that these requests will decrease the amount of time that you wait before being seen by our physicians.       _____________________________________________________________  Should you have questions after your visit to [facility], please contact our office at [phone] and follow the prompts.  Our office hours are 8:00 a.m. and 4:30 p.m. Monday - Friday.  Please note that voicemails left after 4:00 p.m. may not be returned until the following business day.  We are closed weekends and major holidays.  You do have access to a nurse 24-7, just call the main number to the clinic [phone] and do not press any options, hold on the line and a nurse will answer the phone.    For prescription refill requests, have your pharmacy contact our office and allow 72 hours.    Due to Covid, you will need to wear a mask upon entering the hospital. If you do not have a mask, a mask will be given to you at the Main Entrance  upon arrival. For doctor visits, patients may have 1 support person age 71 or older with them. For treatment visits, patients can not have anyone with them due to social distancing guidelines and our immunocompromised population.

## 2022-05-09 NOTE — Progress Notes (Signed)
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 71 y.o. male is here because of evaluation for prostate cancer metastatic to intrapelvic lymph node, at the request of AUR.  Today he reports feeling well. He reports following starting Xtandi he experienced vision changes and jaw pain, and he was unable to tolerate it. He has a history of an abdominal aneurysm. He started 2 tablets of Erleada daily on 5/15, and he reports he is not still having the same vision changes and jaw pains. He reports fatigue. He was started on Lupron in September 2021 and has been receiving it every 6 months. He reports a history of DM. He denies history of cardiac issues. He reports 1 CVA 3 years ago.   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 He currently smoked 2 ppd and has smoked since he was 44 years old. His mother had skin cancer, and he does not believe it was melanoma; he otherwise denies family history of cancer.   MEDICAL HISTORY:  Past Medical History:  Diagnosis Date   Abdominal aortic aneurysm (AAA) (HCC)    Anxiety    COPD (chronic obstructive pulmonary disease) ([place])    Coronary artery calcification seen on CT scan    Essential hypertension    Lung cancer (HCC)    Non small cell carcinoma - XRT   Non-small cell carcinoma of left lung, stage 1 ([place]) 09/21/2017   Prostate cancer ([place])    XRT   Stroke ([place])    Type 2  diabetes mellitus ([place])     SURGICAL HISTORY: Past Surgical History:  Procedure Laterality Date   COLONOSCOPY N/A 11/23/2015   Procedure: COLONOSCOPY;  Surgeon: Aviva Signs, MD;  [facility];  Service: Gastroenterology;  Laterality: N/A;   FRACTURE SURGERY     PROSTATE BIOPSY     SINUS SURGERY WITH INSTATRAK      SOCIAL HISTORY: Social History   Socioeconomic History   Marital status: Married    Spouse name: Not on file   Number of children: Not on file   Years of education: Not on file   Highest education level: Not on file  Occupational History   Not on file  Tobacco Use   Smoking status: Every Day    Packs/day: 2.00    Years: 44.00    Pack years: 88.00    Types: Cigarettes   Smokeless tobacco: Never  Vaping Use   Vaping Use: Never used  Substance and Sexual Activity   Alcohol use: No    Alcohol/week: 0.0 standard drinks   Drug use: No   Sexual activity: Not on file  Other Topics Concern   Not on file  Social History Narrative   Not on file   Social Determinants of Health   Financial Resource Strain: Not on file  Food Insecurity: Not on file  Transportation Needs: Not on file  Physical Activity: Not on file  Stress: Not on file  Social Connections: Not on file  Intimate Partner Violence: Not on file    FAMILY HISTORY: Family History  Problem Relation Age of Onset   Heart disease Father        before age 65   Heart disease Mother    Cancer Neg Hx     ALLERGIES:  is allergic to sulfa antibiotics, sulfasalazine, lipitor [atorvastatin], and simvastatin.  MEDICATIONS:  Current Outpatient Medications  Medication Sig Dispense Refill   albuterol (PROVENTIL) (2.5 MG/3ML) 0.083% nebulizer solution Take 2.5 mg by nebulization every 6 (six) hours as needed.     alprazolam (XANAX) 2 MG tablet Take 2 mg by mouth 2 (two) times daily.     apalutamide (ERLEADA) 60 MG tablet Take 4 tablets (240 mg total) by mouth daily. May be taken with or without  food. Swallow tablets whole. 120 tablet 11   B Complex Vitamins (VITAMIN B COMPLEX PO) Take by mouth.     Cholecalciferol (VITAMIN D) 50 MCG (2000 UT) CAPS Take by mouth daily.     citalopram (CELEXA) 40 MG tablet Take 20-40 mg by mouth daily.     glipiZIDE (GLUCOTROL) 5 MG tablet Take 1 tablet (5 mg total) by mouth 2 (two) times daily before a meal. 180 tablet 1   milk thistle 175 MG tablet Take 240 mg by mouth daily.     rosuvastatin (CRESTOR) 10 MG tablet Take 1 tablet (10 mg total) by mouth daily. 90 tablet 3   tamsulosin (FLOMAX) 0.4 MG CAPS capsule Take 1 capsule (0.4 mg total) by mouth in the morning and at bedtime. 60 capsule 11   vitamin C (ASCORBIC ACID) 500 MG tablet Take 1,000 mg by mouth daily.     vitamin E 1000 UNIT capsule Take 180 Units by mouth daily.     zinc gluconate 50 MG tablet Take 50 mg by mouth daily.     No current facility-administered medications for this visit.    REVIEW OF SYSTEMS:   Review of Systems  Constitutional:  Positive for fatigue. Negative for appetite change.  HENT:          Jaw pain  Eyes:  Positive for eye problems.  Genitourinary:  Positive for pelvic pain (6/10 groin).   Musculoskeletal:  Positive for myalgias (6/10 buttock).  Neurological:  Positive for dizziness and numbness (arms).  Psychiatric/Behavioral:  Positive for depression. The patient is nervous/anxious.   All other systems reviewed and are negative.   PHYSICAL EXAMINATION: ECOG PERFORMANCE STATUS: 1 - Symptomatic but completely ambulatory  Vitals:   05/09/22 1331  BP: 110/71  Pulse: 78  Resp: 18  Temp: 98 F (36.7 C)  SpO2: 96%   Filed Weights   05/09/22 1331  Weight: 164 lb 1.6 oz (74.4 kg)   Physical Exam Vitals reviewed.  Constitutional:      Appearance: Normal appearance.  Cardiovascular:     Rate and Rhythm: Normal rate and regular rhythm.     Pulses: Normal pulses.     Heart sounds: Normal heart sounds.  Pulmonary:     Effort: Pulmonary effort is  normal.     Breath sounds: Normal breath sounds.  Abdominal:     Palpations: Abdomen is soft. There is no hepatomegaly, splenomegaly or mass.     Tenderness: There is no abdominal tenderness.  Musculoskeletal:     Right lower leg: No edema.     Left lower leg: No edema.  Lymphadenopathy:     Upper Body:     Right upper body: No supraclavicular adenopathy.  Left upper body: No supraclavicular adenopathy.     Lower Body: No right inguinal adenopathy. No left inguinal adenopathy.  Neurological:     General: No focal deficit present.     Mental Status: He is alert and oriented to person, place, and time.  Psychiatric:        Mood and Affect: Mood normal.        Behavior: Behavior normal.     LABORATORY DATA:  I have reviewed the data as listed    Latest Ref Rng & Units 05/15/2019    6:14 PM 05/13/2012    8:06 PM  CBC  WBC 4.0 - 10.5 K/uL 10.5   25.7    Hemoglobin 13.0 - 17.0 g/dL 14.8   12.6    Hematocrit 39.0 - 52.0 % 46.4   37.0    Platelets 150 - 400 K/uL 263   245        Latest Ref Rng & Units 11/29/2021    3:03 PM 01/31/2021   11:45 AM 01/31/2021   11:44 AM  CMP  Glucose 70 - 99 mg/dL 147    165    BUN 8 - 27 mg/dL 13    20    Creatinine 0.76 - 1.27 mg/dL 1.20    1.21    Sodium 134 - 144 mmol/L 138    140    Potassium 3.5 - 5.2 mmol/L 4.9    4.8    Chloride 96 - 106 mmol/L 101    99    CO2 20 - 29 mmol/L 22    22    Calcium 8.6 - 10.2 mg/dL 9.3    9.7    Total Protein 6.0 - 8.5 g/dL 7.7   7.6   7.6    Total Bilirubin 0.0 - 1.2 mg/dL 0.3   0.5   0.5    Alkaline Phos 44 - 121 IU/L 102   105   103    AST 0 - 40 IU/L '20   30   29    ' ALT 0 - 44 IU/L 16   34   36      RADIOGRAPHIC STUDIES: I have personally reviewed the radiological images as listed and agreed with the findings in the report. No results found.  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 - Prostate IMRT  from 12/13/2016 through 02/09/2017, 78 Gray in 40 fractions with ADT. - Lupron started back on 09/01/2020 for rising PSA levels. - PSMA PET scan (03/30/2022): Nodes in the retroperitoneum, right pelvis, left lower jugular/supraclavicular stations.  No bone metastasis.  No local recurrence. - Enzalutamide started on 04/03/2022, discontinued due to extreme fatigue, vision changes and jaw pain. - Apalutamide started on 05/01/2022, taking only 2 pills but experiences jaw pain and neck pain and vision changes.   Social/family history: - Lives at home with his wife.  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 - Current active smoker, 2 packs/day for 55 years.  Rolls his own cigarettes. - Mother had skin cancers.  3.  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 - Definitive SBRT from 10/05/2017 through 10/12/2017, 54 Gray in 3 fractions of [place].   PLAN:  Metastatic CRPC to the lymph nodes: - I have reviewed PET CT scan images with the patient and his wife. - Patient reports that he is not able to tolerate 2  tablets of Erleada daily due to jaw pain and neck pain and vision changes. - He has met with Dr. Tammi Klippel for pelvic and retroperitoneal lymph node radiation. - He will proceed with radiation and may discontinue apalutamide as he is not tolerating well. - I have recommended genetic testing.  - I plan to see him back in 6 weeks for follow-up with repeat PSA. - We have briefly talked about initiating on Abiraterone with prednisone to see if he tolerates it.  2.  Bone health: - We will obtain DEXA scan prior to next visit.   All questions were answered. The patient knows to call the clinic with any problems, questions or concerns.   Derek Jack, MD, 05/09/22 2:19 PM  [facility] [phone]   I, Thana Ates, am acting as a scribe for Dr. Derek Jack.  I, Derek Jack MD, have  reviewed the above documentation for accuracy and completeness, and I agree with the above.

## 2022-05-09 NOTE — Telephone Encounter (Signed)
Patient called, identity verified. Patient would like to start Radiation TX's as soon as possible. I told patient that I would pass this information along to his care team [facility] [place] PA-C & Dr. Tammi Klippel MD) here at [facility]. Patient verbalized understanding of the conversation.

## 2022-05-16 ENCOUNTER — Other Ambulatory Visit: Payer: Self-pay | Admitting: "Endocrinology

## 2022-05-16 ENCOUNTER — Telehealth: Payer: Self-pay | Admitting: "Endocrinology

## 2022-05-16 DIAGNOSIS — E1159 Type 2 diabetes mellitus with other circulatory complications: Secondary | ICD-10-CM

## 2022-05-16 MED ORDER — ACCU-CHEK GUIDE VI STRP: ORAL_STRIP | 2 refills | Status: DC

## 2022-05-16 MED ORDER — ACCU-CHEK GUIDE ME W/DEVICE KIT: 1.0000 | PACK | 0 refills | Status: DC

## 2022-05-16 NOTE — Addendum Note (Signed)
Addended by: Ellin Saba on: 05/16/2022 04:22 PM   Modules accepted: Orders

## 2022-05-16 NOTE — Telephone Encounter (Signed)
Informed patient. He will need a new rx of test strips stating he is testing 4x a day so that they can give him enough. He is currently out.   [place], [place] Phone:  [phone]  Fax:  [phone]

## 2022-05-16 NOTE — Telephone Encounter (Signed)
Pt called and said his cancer is back and last time when he started radiation it threw his sugar off and Dr Dorris Fetch increased his glipizide. Patient has not started radiation yet but states he is in the next several days and he will be due for a new refill and if his glipizide has to be increased he would like for it to be done fore the refill. Please advise.

## 2022-05-16 NOTE — Telephone Encounter (Signed)
Rx sent 

## 2022-05-17 ENCOUNTER — Ambulatory Visit (HOSPITAL_COMMUNITY)
Admission: RE | Admit: 2022-05-17 | Discharge: 2022-05-17 | Disposition: A | Discharge status: Home / Self Care | Payer: Medicare Other | Source: Ambulatory Visit | Attending: Hematology | Admitting: Hematology

## 2022-05-17 DIAGNOSIS — C61 Malignant neoplasm of prostate: Secondary | ICD-10-CM | POA: Diagnosis not present

## 2022-05-17 DIAGNOSIS — M818 Other osteoporosis without current pathological fracture: Secondary | ICD-10-CM | POA: Diagnosis not present

## 2022-05-17 DIAGNOSIS — M8589 Other specified disorders of bone density and structure, multiple sites: Secondary | ICD-10-CM | POA: Diagnosis not present

## 2022-05-17 DIAGNOSIS — Z8546 Personal history of malignant neoplasm of prostate: Secondary | ICD-10-CM | POA: Diagnosis not present

## 2022-05-17 DIAGNOSIS — J0101 Acute recurrent maxillary sinusitis: Secondary | ICD-10-CM | POA: Diagnosis not present

## 2022-05-17 DIAGNOSIS — M069 Rheumatoid arthritis, unspecified: Secondary | ICD-10-CM | POA: Diagnosis not present

## 2022-05-17 DIAGNOSIS — D8481 Immunodeficiency due to conditions classified elsewhere: Secondary | ICD-10-CM | POA: Diagnosis not present

## 2022-05-17 DIAGNOSIS — Z833 Family history of diabetes mellitus: Secondary | ICD-10-CM | POA: Diagnosis not present

## 2022-05-17 DIAGNOSIS — C775 Secondary and unspecified malignant neoplasm of intrapelvic lymph nodes: Secondary | ICD-10-CM | POA: Diagnosis not present

## 2022-05-18 ENCOUNTER — Other Ambulatory Visit: Payer: Self-pay

## 2022-05-18 ENCOUNTER — Ambulatory Visit
Admission: RE | Admit: 2022-05-18 | Discharge: 2022-05-18 | Disposition: A | Discharge status: Home / Self Care | Payer: Medicare Other | Source: Ambulatory Visit | Attending: Radiation Oncology | Admitting: Radiation Oncology

## 2022-05-18 DIAGNOSIS — C775 Secondary and unspecified malignant neoplasm of intrapelvic lymph nodes: Secondary | ICD-10-CM | POA: Insufficient documentation

## 2022-05-18 DIAGNOSIS — C61 Malignant neoplasm of prostate: Secondary | ICD-10-CM | POA: Diagnosis not present

## 2022-05-18 DIAGNOSIS — C771 Secondary and unspecified malignant neoplasm of intrathoracic lymph nodes: Secondary | ICD-10-CM | POA: Insufficient documentation

## 2022-05-18 DIAGNOSIS — C772 Secondary and unspecified malignant neoplasm of intra-abdominal lymph nodes: Secondary | ICD-10-CM | POA: Diagnosis not present

## 2022-05-18 DIAGNOSIS — Z192 Hormone resistant malignancy status: Secondary | ICD-10-CM | POA: Diagnosis not present

## 2022-05-23 NOTE — Progress Notes (Signed)
  Radiation Oncology         [phone]) [id number] ________________________________  Name: Stephen Palmer. MRN: [id number]  Date: 05/18/2022  DOB: 1951/12/05  ULTRAHYPOFRACTIONATED RADIOTHERAPY  SIMULATION AND TREATMENT PLANNING NOTE    ICD-10-CM   1. Prostate cancer metastatic to intrapelvic lymph node ([place])  C61    C77.5       DIAGNOSIS:  71 y.o. male with h/o prostate cancer with oligometastasis to abdominal and pelvic lymph nodes.   NARRATIVE:  The patient was brought to the [facility].  Identity was confirmed.  All relevant records and images related to the planned course of therapy were reviewed.  The patient freely provided informed written consent to proceed with treatment after reviewing the details related to the planned course of therapy. The consent form was witnessed and verified by the simulation staff.  Then, the patient was set-up in a stable reproducible  supine position for radiation therapy.  A BodyFix immobilization pillow was fabricated for reproducible positioning.  Surface markings were placed.  The CT images were loaded into the planning software.  The gross target volumes (GTV) and planning target volumes (PTV) were delinieated, and avoidance structures were contoured.  Treatment planning then occurred.  The radiation prescription was entered and confirmed.  A total of two complex treatment devices were fabricated in the form of the BodyFix immobilization pillow and a neck accuform cushion.  I have requested : 3D Simulation  I have requested a DVH of the following structures: targets and all normal structures near the target including kidneys, bowel, spinal cord, skin and others as noted on the radiation plan to maintain doses in adherence with established limits  SPECIAL TREATMENT PROCEDURE:  The planned course of therapy using radiation constitutes a special treatment procedure. Special care is required in the management of this patient for the following  reasons. High dose per fraction requiring special monitoring for increased toxicities of treatment including daily imaging..  The special nature of the planned course of radiotherapy will require increased physician supervision and oversight to ensure patient's safety with optimal treatment outcomes.    This requires extended time and effort.    PLAN:  The patient will receive 50 Gy in 10 fractions.  ________________________________  Sheral Apley Tammi Klippel, M.D.

## 2022-05-29 DIAGNOSIS — C771 Secondary and unspecified malignant neoplasm of intrathoracic lymph nodes: Secondary | ICD-10-CM | POA: Diagnosis not present

## 2022-05-29 DIAGNOSIS — Z192 Hormone resistant malignancy status: Secondary | ICD-10-CM | POA: Diagnosis not present

## 2022-05-29 DIAGNOSIS — C775 Secondary and unspecified malignant neoplasm of intrapelvic lymph nodes: Secondary | ICD-10-CM | POA: Diagnosis not present

## 2022-05-29 DIAGNOSIS — C772 Secondary and unspecified malignant neoplasm of intra-abdominal lymph nodes: Secondary | ICD-10-CM | POA: Diagnosis not present

## 2022-05-29 DIAGNOSIS — C61 Malignant neoplasm of prostate: Secondary | ICD-10-CM | POA: Diagnosis not present

## 2022-05-30 ENCOUNTER — Ambulatory Visit
Admission: RE | Admit: 2022-05-30 | Discharge: 2022-05-30 | Disposition: A | Discharge status: Home / Self Care | Payer: Medicare Other | Source: Ambulatory Visit | Attending: Radiation Oncology | Admitting: Radiation Oncology

## 2022-05-30 ENCOUNTER — Other Ambulatory Visit: Payer: Self-pay

## 2022-05-30 DIAGNOSIS — C61 Malignant neoplasm of prostate: Secondary | ICD-10-CM

## 2022-05-30 DIAGNOSIS — Z192 Hormone resistant malignancy status: Secondary | ICD-10-CM | POA: Diagnosis not present

## 2022-05-30 DIAGNOSIS — C772 Secondary and unspecified malignant neoplasm of intra-abdominal lymph nodes: Secondary | ICD-10-CM | POA: Diagnosis not present

## 2022-05-30 DIAGNOSIS — Z510 Encounter for antineoplastic radiation therapy: Secondary | ICD-10-CM | POA: Diagnosis not present

## 2022-05-30 DIAGNOSIS — C775 Secondary and unspecified malignant neoplasm of intrapelvic lymph nodes: Secondary | ICD-10-CM | POA: Diagnosis not present

## 2022-05-30 DIAGNOSIS — C771 Secondary and unspecified malignant neoplasm of intrathoracic lymph nodes: Secondary | ICD-10-CM | POA: Diagnosis not present

## 2022-05-30 DIAGNOSIS — E1159 Type 2 diabetes mellitus with other circulatory complications: Secondary | ICD-10-CM | POA: Diagnosis not present

## 2022-05-30 LAB — RAD ONC ARIA SESSION SUMMARY
Course Elapsed Days: 0
Plan Fractions Treated to Date: 1
Plan Prescribed Dose Per Fraction: 5 Gy
Plan Total Fractions Prescribed: 10
Plan Total Prescribed Dose: 50 Gy
Reference Point Dosage Given to Date: 5 Gy
Reference Point Session Dosage Given: 5 Gy
Session Number: 1

## 2022-05-31 ENCOUNTER — Other Ambulatory Visit: Payer: Self-pay

## 2022-05-31 ENCOUNTER — Ambulatory Visit
Admission: RE | Admit: 2022-05-31 | Discharge: 2022-05-31 | Disposition: A | Discharge status: Home / Self Care | Payer: Medicare Other | Source: Ambulatory Visit | Attending: Radiation Oncology | Admitting: Radiation Oncology

## 2022-05-31 DIAGNOSIS — C772 Secondary and unspecified malignant neoplasm of intra-abdominal lymph nodes: Secondary | ICD-10-CM | POA: Diagnosis not present

## 2022-05-31 DIAGNOSIS — C61 Malignant neoplasm of prostate: Secondary | ICD-10-CM | POA: Diagnosis not present

## 2022-05-31 DIAGNOSIS — C775 Secondary and unspecified malignant neoplasm of intrapelvic lymph nodes: Secondary | ICD-10-CM | POA: Diagnosis not present

## 2022-05-31 DIAGNOSIS — Z192 Hormone resistant malignancy status: Secondary | ICD-10-CM | POA: Diagnosis not present

## 2022-05-31 DIAGNOSIS — Z510 Encounter for antineoplastic radiation therapy: Secondary | ICD-10-CM | POA: Diagnosis not present

## 2022-05-31 DIAGNOSIS — C771 Secondary and unspecified malignant neoplasm of intrathoracic lymph nodes: Secondary | ICD-10-CM | POA: Diagnosis not present

## 2022-05-31 LAB — COMPREHENSIVE METABOLIC PANEL
ALT: 16 IU/L (ref 0–44)
AST: 15 IU/L (ref 0–40)
Albumin/Globulin Ratio: 2 (ref 1.2–2.2)
Albumin: 4.7 g/dL (ref 3.7–4.7)
Alkaline Phosphatase: 110 IU/L (ref 44–121)
BUN/Creatinine Ratio: 13 (ref 10–24)
BUN: 16 mg/dL (ref 8–27)
Bilirubin Total: 0.2 mg/dL (ref 0.0–1.2)
CO2: 22 mmol/L (ref 20–29)
Calcium: 9.8 mg/dL (ref 8.6–10.2)
Chloride: 100 mmol/L (ref 96–106)
Creatinine, Ser: 1.23 mg/dL (ref 0.76–1.27)
Globulin, Total: 2.4 g/dL (ref 1.5–4.5)
Glucose: 148 mg/dL — ABNORMAL HIGH (ref 70–99)
Potassium: 4.9 mmol/L (ref 3.5–5.2)
Sodium: 137 mmol/L (ref 134–144)
Total Protein: 7.1 g/dL (ref 6.0–8.5)
eGFR: 63 mL/min/{1.73_m2} (ref >59)

## 2022-05-31 LAB — PSA: Prostate Specific Ag, Serum: 0.8 ng/mL (ref 0.0–4.0)

## 2022-05-31 LAB — RAD ONC ARIA SESSION SUMMARY
Course Elapsed Days: 1
Plan Fractions Treated to Date: 2
Plan Prescribed Dose Per Fraction: 5 Gy
Plan Total Fractions Prescribed: 10
Plan Total Prescribed Dose: 50 Gy
Reference Point Dosage Given to Date: 10 Gy
Reference Point Session Dosage Given: 5 Gy
Session Number: 2

## 2022-06-01 ENCOUNTER — Ambulatory Visit
Admission: RE | Admit: 2022-06-01 | Discharge: 2022-06-01 | Disposition: A | Discharge status: Home / Self Care | Payer: Medicare Other | Source: Ambulatory Visit | Attending: Radiation Oncology | Admitting: Radiation Oncology

## 2022-06-01 ENCOUNTER — Other Ambulatory Visit: Payer: Self-pay

## 2022-06-01 DIAGNOSIS — C775 Secondary and unspecified malignant neoplasm of intrapelvic lymph nodes: Secondary | ICD-10-CM | POA: Diagnosis not present

## 2022-06-01 DIAGNOSIS — C61 Malignant neoplasm of prostate: Secondary | ICD-10-CM | POA: Diagnosis not present

## 2022-06-01 DIAGNOSIS — Z510 Encounter for antineoplastic radiation therapy: Secondary | ICD-10-CM | POA: Diagnosis not present

## 2022-06-01 DIAGNOSIS — C771 Secondary and unspecified malignant neoplasm of intrathoracic lymph nodes: Secondary | ICD-10-CM | POA: Diagnosis not present

## 2022-06-01 DIAGNOSIS — Z192 Hormone resistant malignancy status: Secondary | ICD-10-CM | POA: Diagnosis not present

## 2022-06-01 DIAGNOSIS — C772 Secondary and unspecified malignant neoplasm of intra-abdominal lymph nodes: Secondary | ICD-10-CM | POA: Diagnosis not present

## 2022-06-01 LAB — RAD ONC ARIA SESSION SUMMARY
Course Elapsed Days: 2
Plan Fractions Treated to Date: 3
Plan Prescribed Dose Per Fraction: 5 Gy
Plan Total Fractions Prescribed: 10
Plan Total Prescribed Dose: 50 Gy
Reference Point Dosage Given to Date: 15 Gy
Reference Point Session Dosage Given: 5 Gy
Session Number: 3

## 2022-06-02 ENCOUNTER — Ambulatory Visit
Admission: RE | Admit: 2022-06-02 | Discharge: 2022-06-02 | Disposition: A | Discharge status: Home / Self Care | Payer: Medicare Other | Source: Ambulatory Visit | Attending: Radiation Oncology | Admitting: Radiation Oncology

## 2022-06-02 ENCOUNTER — Other Ambulatory Visit: Payer: Self-pay

## 2022-06-02 DIAGNOSIS — C771 Secondary and unspecified malignant neoplasm of intrathoracic lymph nodes: Secondary | ICD-10-CM | POA: Diagnosis not present

## 2022-06-02 DIAGNOSIS — Z510 Encounter for antineoplastic radiation therapy: Secondary | ICD-10-CM | POA: Diagnosis not present

## 2022-06-02 DIAGNOSIS — Z192 Hormone resistant malignancy status: Secondary | ICD-10-CM | POA: Diagnosis not present

## 2022-06-02 DIAGNOSIS — C775 Secondary and unspecified malignant neoplasm of intrapelvic lymph nodes: Secondary | ICD-10-CM | POA: Diagnosis not present

## 2022-06-02 DIAGNOSIS — C61 Malignant neoplasm of prostate: Secondary | ICD-10-CM | POA: Diagnosis not present

## 2022-06-02 DIAGNOSIS — C772 Secondary and unspecified malignant neoplasm of intra-abdominal lymph nodes: Secondary | ICD-10-CM | POA: Diagnosis not present

## 2022-06-02 LAB — RAD ONC ARIA SESSION SUMMARY
Course Elapsed Days: 3
Plan Fractions Treated to Date: 4
Plan Prescribed Dose Per Fraction: 5 Gy
Plan Total Fractions Prescribed: 10
Plan Total Prescribed Dose: 50 Gy
Reference Point Dosage Given to Date: 20 Gy
Reference Point Session Dosage Given: 5 Gy
Session Number: 4

## 2022-06-05 ENCOUNTER — Ambulatory Visit
Admission: RE | Admit: 2022-06-05 | Discharge: 2022-06-05 | Disposition: A | Discharge status: Home / Self Care | Payer: Medicare Other | Source: Ambulatory Visit | Attending: Radiation Oncology | Admitting: Radiation Oncology

## 2022-06-05 ENCOUNTER — Other Ambulatory Visit: Payer: Self-pay

## 2022-06-05 ENCOUNTER — Ambulatory Visit: Payer: Medicare Other | Admitting: "Endocrinology

## 2022-06-05 DIAGNOSIS — Z192 Hormone resistant malignancy status: Secondary | ICD-10-CM | POA: Diagnosis not present

## 2022-06-05 DIAGNOSIS — C775 Secondary and unspecified malignant neoplasm of intrapelvic lymph nodes: Secondary | ICD-10-CM | POA: Diagnosis not present

## 2022-06-05 DIAGNOSIS — C771 Secondary and unspecified malignant neoplasm of intrathoracic lymph nodes: Secondary | ICD-10-CM

## 2022-06-05 DIAGNOSIS — C61 Malignant neoplasm of prostate: Secondary | ICD-10-CM | POA: Diagnosis not present

## 2022-06-05 DIAGNOSIS — C772 Secondary and unspecified malignant neoplasm of intra-abdominal lymph nodes: Secondary | ICD-10-CM | POA: Diagnosis not present

## 2022-06-05 DIAGNOSIS — Z510 Encounter for antineoplastic radiation therapy: Secondary | ICD-10-CM | POA: Diagnosis not present

## 2022-06-05 LAB — RAD ONC ARIA SESSION SUMMARY
Course Elapsed Days: 6
Plan Fractions Treated to Date: 5
Plan Prescribed Dose Per Fraction: 5 Gy
Plan Total Fractions Prescribed: 10
Plan Total Prescribed Dose: 50 Gy
Reference Point Dosage Given to Date: 25 Gy
Reference Point Session Dosage Given: 5 Gy
Session Number: 5

## 2022-06-06 ENCOUNTER — Encounter: Payer: Self-pay | Admitting: Urology

## 2022-06-06 ENCOUNTER — Other Ambulatory Visit: Payer: Self-pay

## 2022-06-06 ENCOUNTER — Ambulatory Visit
Admission: RE | Admit: 2022-06-06 | Discharge: 2022-06-06 | Disposition: A | Discharge status: Home / Self Care | Payer: Medicare Other | Source: Ambulatory Visit | Attending: Radiation Oncology | Admitting: Radiation Oncology

## 2022-06-06 ENCOUNTER — Ambulatory Visit (INDEPENDENT_AMBULATORY_CARE_PROVIDER_SITE_OTHER): Payer: Medicare Other | Admitting: Urology

## 2022-06-06 VITALS — BP 90/62 | HR 108 | Ht 70.0 in | Wt 151.0 lb

## 2022-06-06 DIAGNOSIS — Z510 Encounter for antineoplastic radiation therapy: Secondary | ICD-10-CM | POA: Diagnosis not present

## 2022-06-06 DIAGNOSIS — C61 Malignant neoplasm of prostate: Secondary | ICD-10-CM

## 2022-06-06 DIAGNOSIS — Z192 Hormone resistant malignancy status: Secondary | ICD-10-CM | POA: Diagnosis not present

## 2022-06-06 DIAGNOSIS — C775 Secondary and unspecified malignant neoplasm of intrapelvic lymph nodes: Secondary | ICD-10-CM

## 2022-06-06 DIAGNOSIS — I2510 Atherosclerotic heart disease of native coronary artery without angina pectoris: Secondary | ICD-10-CM

## 2022-06-06 DIAGNOSIS — C771 Secondary and unspecified malignant neoplasm of intrathoracic lymph nodes: Secondary | ICD-10-CM | POA: Insufficient documentation

## 2022-06-06 DIAGNOSIS — C772 Secondary and unspecified malignant neoplasm of intra-abdominal lymph nodes: Secondary | ICD-10-CM | POA: Diagnosis not present

## 2022-06-06 LAB — URINALYSIS, ROUTINE W REFLEX MICROSCOPIC
Bilirubin, UA: NEGATIVE
Glucose, UA: NEGATIVE
Leukocytes,UA: NEGATIVE
Nitrite, UA: NEGATIVE
RBC, UA: NEGATIVE
Specific Gravity, UA: 1.02 (ref 1.005–1.030)
Urobilinogen, Ur: 0.2 mg/dL (ref 0.2–1.0)
pH, UA: 6 (ref 5.0–7.5)

## 2022-06-06 LAB — RAD ONC ARIA SESSION SUMMARY
Course Elapsed Days: 7
Plan Fractions Treated to Date: 6
Plan Prescribed Dose Per Fraction: 5 Gy
Plan Total Fractions Prescribed: 10
Plan Total Prescribed Dose: 50 Gy
Reference Point Dosage Given to Date: 30 Gy
Reference Point Session Dosage Given: 5 Gy
Session Number: 6

## 2022-06-06 MED ORDER — ROSUVASTATIN CALCIUM 10 MG PO TABS: 10.0000 mg | ORAL_TABLET | Freq: Every day | ORAL | 3 refills | Status: DC

## 2022-06-06 NOTE — Progress Notes (Signed)
  Radiation Oncology         [id number]) [phone] ________________________________  Name: Stephen Palmer. MRN: [id number]  Date: 06/05/2022  DOB: August 23, 1951  STEREOTACTIC BODY RADIOTHERAPY SIMULATION AND TREATMENT PLANNING NOTE    ICD-10-CM   1. Prostate cancer metastatic to intrathoracic lymph node (HCC)  C61    C77.1       DIAGNOSIS:  71 y.o. male with h/o prostate cancer with oligometastasis to left supraclavicular lymph nodes.   NARRATIVE:  The patient was brought to the [facility].  Identity was confirmed.  All relevant records and images related to the planned course of therapy were reviewed.  The patient freely provided informed written consent to proceed with treatment after reviewing the details related to the planned course of therapy. The consent form was witnessed and verified by the simulation staff.  Then, the patient was set-up in a stable reproducible  supine position for radiation therapy.  A neck cushion immobilization pillow was fabricated for reproducible positioning and also an aquaplast mask.  Surface markings were placed.  The CT images were loaded into the planning software.  The gross target volumes (GTV) and planning target volumes (PTV) were delinieated, and avoidance structures were contoured.  Treatment planning then occurred.  The radiation prescription was entered and confirmed.   have requested : 3D Simulation  I have requested a DVH of the following structures: targets and all normal structures near the target including spinal cord, left brachial plexus, thyroid, larynx and skin as noted on the radiation plan to maintain doses in adherence with established limits  SPECIAL TREATMENT PROCEDURE:  The planned course of therapy using radiation constitutes a special treatment procedure. Special care is required in the management of this patient for the following reasons. High dose per fraction requiring special monitoring for increased toxicities of  treatment including daily imaging..  The special nature of the planned course of radiotherapy will require increased physician supervision and oversight to ensure patient's safety with optimal treatment outcomes.    This requires extended time and effort.    PLAN:  The patient will receive 40 Gy in 5 fractions.  ________________________________  Stephen Palmer, M.D.

## 2022-06-06 NOTE — Progress Notes (Unsigned)
06/06/2022 10:49 AM   Stephen Palmer 14-May-1951 [id number]  Referring provider: [name], PA-C [address] [place],  [place] [address]  Followup metastatic prostate cancer   HPI: [name] is a 71yo here for followup for metastatic castrate resistant prostate cancer.  PSA decreased to 0.8. He stopped Erleada due to fatigue. He is having radiation therapy with Stephen Palmer for abdominal and pelvic lymphadenopathy. No other complaints today   PMH: Past Medical History:  Diagnosis Date   Abdominal aortic aneurysm (AAA) (HCC)    Anxiety    COPD (chronic obstructive pulmonary disease) (HCC)    Coronary artery calcification seen on CT scan    Essential hypertension    Lung cancer (HCC)    Non small cell carcinoma - XRT   Non-small cell carcinoma of left lung, stage 1 ([facility]) 09/21/2017   Prostate cancer ([place])    XRT   Stroke ([place])    Type 2 diabetes mellitus ([place])     Surgical History: Past Surgical History:  Procedure Laterality Date   COLONOSCOPY N/A 11/23/2015   Procedure: COLONOSCOPY;  Surgeon: Stephen Signs, MD;  [facility];  Service: Gastroenterology;  Laterality: N/A;   FRACTURE SURGERY     PROSTATE BIOPSY     SINUS SURGERY WITH INSTATRAK      Home Medications:  Allergies as of 06/06/2022       Reactions   Sulfa Antibiotics Hives, Itching, Swelling   Tongue swelling   Sulfasalazine Hives, Itching, Swelling   Tongue swelling   Lipitor [atorvastatin] Other (See Comments)   myalgia   Simvastatin Other (See Comments)   myalgia        Medication List        Accurate as of June 06, 2022 10:49 AM. If you have any questions, ask your nurse or doctor.          Accu-Chek Guide Me w/Device Kit 1 Piece by Does not apply route as directed.   Accu-Chek Guide test strip Generic drug: glucose blood Use to test glucose 4 times a day.   albuterol (2.5 MG/3ML) 0.083% nebulizer solution Commonly known as: PROVENTIL Take 2.5 mg by  nebulization every 6 (six) hours as needed.   alprazolam 2 MG tablet Commonly known as: XANAX Take 2 mg by mouth 2 (two) times daily.   citalopram 40 MG tablet Commonly known as: CELEXA Take 20-40 mg by mouth daily.   Erleada 60 MG tablet Generic drug: apalutamide Take 4 tablets (240 mg total) by mouth daily. May be taken with or without food. Swallow tablets whole.   glipiZIDE 5 MG tablet Commonly known as: GLUCOTROL Take 1 tablet (5 mg total) by mouth 2 (two) times daily before a meal.   milk thistle 175 MG tablet Take 240 mg by mouth daily.   rosuvastatin 10 MG tablet Commonly known as: CRESTOR Take 1 tablet (10 mg total) by mouth daily.   tamsulosin 0.4 MG Caps capsule Commonly known as: FLOMAX Take 1 capsule (0.4 mg total) by mouth in the morning and at bedtime.   VITAMIN B COMPLEX PO Take by mouth.   vitamin C 500 MG tablet Commonly known as: ASCORBIC ACID Take 1,000 mg by mouth daily.   Vitamin D 50 MCG (2000 UT) Caps Take by mouth daily.   vitamin E 1000 UNIT capsule Take 180 Units by mouth daily.   zinc gluconate 50 MG tablet Take 50 mg by mouth daily.  Allergies:  Allergies  Allergen Reactions   Sulfa Antibiotics Hives, Itching and Swelling    Tongue swelling   Sulfasalazine Hives, Itching and Swelling    Tongue swelling   Lipitor [Atorvastatin] Other (See Comments)    myalgia   Simvastatin Other (See Comments)    myalgia    Family History: Family History  Problem Relation Age of Onset   Heart disease Father        before age 35   Heart disease Mother    Cancer Neg Hx     Social History:  reports that he has been smoking cigarettes. He has a 88.00 pack-year smoking history. He has never used smokeless tobacco. He reports that he does not drink alcohol and does not use drugs.  ROS: All other review of systems were reviewed and are negative except what is noted above in HPI  Physical Exam: BP 90/62   Pulse (!) 108   Ht 5'  10" (1.778 m)   Wt 151 lb (68.5 kg)   BMI 21.67 kg/m   Constitutional:  Alert and oriented, No acute distress. HEENT: Stephen Palmer AT, moist mucus membranes.  Trachea midline, no masses. Cardiovascular: No clubbing, cyanosis, or edema. Respiratory: Normal respiratory effort, no increased work of breathing. GI: Abdomen is soft, nontender, nondistended, no abdominal masses GU: No CVA tenderness.  Lymph: No cervical or inguinal lymphadenopathy. Skin: No rashes, bruises or suspicious lesions. Neurologic: Grossly intact, no focal deficits, moving all 4 extremities. Psychiatric: Normal mood and affect.  Laboratory Data: Lab Results  Component Value Date   WBC 10.5 05/15/2019   HGB 14.8 05/15/2019   HCT 46.4 05/15/2019   MCV 96.5 05/15/2019   PLT 263 05/15/2019    Lab Results  Component Value Date   CREATININE 1.23 05/30/2022    Lab Results  Component Value Date   PSA 9.7 (H) 05/19/2020   PSA 8.7 (H) 05/11/2020    Lab Results  Component Value Date   TESTOSTERONE 166 (L) 05/11/2020    Lab Results  Component Value Date   HGBA1C 6.7 12/05/2021    Urinalysis    Component Value Date/Time   APPEARANCEUR Clear 05/01/2022 1120   GLUCOSEU Negative 05/01/2022 1120   BILIRUBINUR Negative 05/01/2022 1120   PROTEINUR Negative 05/01/2022 1120   NITRITE Negative 05/01/2022 1120   LEUKOCYTESUR Negative 05/01/2022 1120    Lab Results  Component Value Date   LABMICR Comment 05/01/2022    Pertinent Imaging:  Results for orders placed during the hospital encounter of 05/14/08  DG Abd 1 View  Narrative Clinical Data: Left ureteral calculus.  ABDOMEN - 1 VIEW  Comparison: CT abdomen and pelvis 04/08/08.  Findings: No unexpected calcifications are seen over the renal shadows, expected course of the ureters or urinary bladder. Specifically, the small left UVJ stone seen the patient's CT scan is not visible on this study.  Bowel gas pattern is normal.  No focal bony  abnormality.  IMPRESSION: Negative for plain film evidence of urinary tract calculi.  Provider: Jennye Boroughs, Brooke Dare  No results found for this or any previous visit.  No results found for this or any previous visit.  No results found for this or any previous visit.  No results found for this or any previous visit.  No results found for this or any previous visit.  No results found for this or any previous visit.  No results found for this or any previous visit.   Assessment & Plan:    1.  Prostate cancer metastatic to intrapelvic lymph node (HCC) *** - Urinalysis, Routine w reflex microscopic   No follow-ups on file.  Nicolette Bang, MD  [facility]

## 2022-06-07 ENCOUNTER — Other Ambulatory Visit: Payer: Self-pay

## 2022-06-07 ENCOUNTER — Ambulatory Visit
Admission: RE | Admit: 2022-06-07 | Discharge: 2022-06-07 | Disposition: A | Discharge status: Home / Self Care | Payer: Medicare Other | Source: Ambulatory Visit | Attending: Radiation Oncology | Admitting: Radiation Oncology

## 2022-06-07 ENCOUNTER — Telehealth: Payer: Self-pay

## 2022-06-07 DIAGNOSIS — C61 Malignant neoplasm of prostate: Secondary | ICD-10-CM | POA: Diagnosis not present

## 2022-06-07 DIAGNOSIS — Z192 Hormone resistant malignancy status: Secondary | ICD-10-CM | POA: Diagnosis not present

## 2022-06-07 DIAGNOSIS — C771 Secondary and unspecified malignant neoplasm of intrathoracic lymph nodes: Secondary | ICD-10-CM | POA: Diagnosis not present

## 2022-06-07 DIAGNOSIS — C775 Secondary and unspecified malignant neoplasm of intrapelvic lymph nodes: Secondary | ICD-10-CM | POA: Diagnosis not present

## 2022-06-07 LAB — RAD ONC ARIA SESSION SUMMARY
Course Elapsed Days: 8
Plan Fractions Treated to Date: 7
Plan Prescribed Dose Per Fraction: 5 Gy
Plan Total Fractions Prescribed: 10
Plan Total Prescribed Dose: 50 Gy
Reference Point Dosage Given to Date: 35 Gy
Reference Point Session Dosage Given: 5 Gy
Session Number: 7

## 2022-06-07 NOTE — Telephone Encounter (Signed)
Pharmacy called requesting updated office notes and recent labs for PA for Erleada.  At last visit patient reported that he is no longer taking the rx so PA not needed.

## 2022-06-08 ENCOUNTER — Encounter: Payer: Self-pay | Admitting: "Endocrinology

## 2022-06-08 ENCOUNTER — Other Ambulatory Visit: Payer: Self-pay

## 2022-06-08 ENCOUNTER — Ambulatory Visit (INDEPENDENT_AMBULATORY_CARE_PROVIDER_SITE_OTHER): Payer: Medicare Other | Admitting: "Endocrinology

## 2022-06-08 ENCOUNTER — Ambulatory Visit
Admission: RE | Admit: 2022-06-08 | Discharge: 2022-06-08 | Disposition: A | Discharge status: Home / Self Care | Payer: Medicare Other | Source: Ambulatory Visit | Attending: Radiation Oncology | Admitting: Radiation Oncology

## 2022-06-08 VITALS — BP 120/76 | HR 76 | Ht 70.0 in | Wt 160.8 lb

## 2022-06-08 DIAGNOSIS — E559 Vitamin D deficiency, unspecified: Secondary | ICD-10-CM | POA: Diagnosis not present

## 2022-06-08 DIAGNOSIS — C772 Secondary and unspecified malignant neoplasm of intra-abdominal lymph nodes: Secondary | ICD-10-CM | POA: Diagnosis not present

## 2022-06-08 DIAGNOSIS — Z192 Hormone resistant malignancy status: Secondary | ICD-10-CM | POA: Diagnosis not present

## 2022-06-08 DIAGNOSIS — F17200 Nicotine dependence, unspecified, uncomplicated: Secondary | ICD-10-CM | POA: Diagnosis not present

## 2022-06-08 DIAGNOSIS — E782 Mixed hyperlipidemia: Secondary | ICD-10-CM | POA: Diagnosis not present

## 2022-06-08 DIAGNOSIS — C775 Secondary and unspecified malignant neoplasm of intrapelvic lymph nodes: Secondary | ICD-10-CM | POA: Diagnosis not present

## 2022-06-08 DIAGNOSIS — C771 Secondary and unspecified malignant neoplasm of intrathoracic lymph nodes: Secondary | ICD-10-CM | POA: Diagnosis not present

## 2022-06-08 DIAGNOSIS — C61 Malignant neoplasm of prostate: Secondary | ICD-10-CM | POA: Diagnosis not present

## 2022-06-08 DIAGNOSIS — Z510 Encounter for antineoplastic radiation therapy: Secondary | ICD-10-CM | POA: Diagnosis not present

## 2022-06-08 DIAGNOSIS — E1159 Type 2 diabetes mellitus with other circulatory complications: Secondary | ICD-10-CM

## 2022-06-08 DIAGNOSIS — I2510 Atherosclerotic heart disease of native coronary artery without angina pectoris: Secondary | ICD-10-CM | POA: Diagnosis not present

## 2022-06-08 LAB — POCT GLYCOSYLATED HEMOGLOBIN (HGB A1C): HbA1c, POC (controlled diabetic range): 7.1 % — AB (ref 0.0–7.0)

## 2022-06-08 LAB — RAD ONC ARIA SESSION SUMMARY
Course Elapsed Days: 9
Plan Fractions Treated to Date: 8
Plan Prescribed Dose Per Fraction: 5 Gy
Plan Total Fractions Prescribed: 10
Plan Total Prescribed Dose: 50 Gy
Reference Point Dosage Given to Date: 40 Gy
Reference Point Session Dosage Given: 5 Gy
Session Number: 8

## 2022-06-08 NOTE — Progress Notes (Signed)
06/08/2022, 2:28 PM          Endocrinology follow-up note   Subjective:    Patient ID: Stephen Che., male    DOB: Oct 22, 1951.  Stephen Che. is being seen in follow up in the management of his currently  controlled symptomatic type 2 diabetes requested by  [name], PA-C   Past Medical History:  Diagnosis Date   Abdominal aortic aneurysm (AAA) ([place])    Anxiety    COPD (chronic obstructive pulmonary disease) ([place])    Coronary artery calcification seen on CT scan    Essential hypertension    Lung cancer ([place])    Non small cell carcinoma - XRT   Non-small cell carcinoma of left lung, stage 1 ([place]) 09/21/2017   Prostate cancer ([place])    XRT   Stroke ([place])    Type 2 diabetes mellitus ([place])    Past Surgical History:  Procedure Laterality Date   COLONOSCOPY N/A 11/23/2015   Procedure: COLONOSCOPY;  Surgeon: Aviva Signs, MD;  Location: [facility];  Service: Gastroenterology;  Laterality: N/A;   FRACTURE SURGERY     PROSTATE BIOPSY     SINUS SURGERY WITH INSTATRAK     Social History   Socioeconomic History   Marital status: Married    Spouse name: Not on file   Number of children: Not on file   Years of education: Not on file   Highest education level: Not on file  Occupational History   Not on file  Tobacco Use   Smoking status: Every Day    Packs/day: 2.00    Years: 44.00    Total pack years: 88.00    Types: Cigarettes   Smokeless tobacco: Never  Vaping Use   Vaping Use: Never used  Substance and Sexual Activity   Alcohol use: No    Alcohol/week: 0.0 standard drinks of alcohol   Drug use: No   Sexual activity: Not on file  Other Topics Concern   Not on file  Social History Narrative   Not on file   Social Determinants of Health   Financial Resource Strain: Not on file  Food Insecurity: Not on file  Transportation Needs: Not on file  Physical Activity: Not on file  Stress: Not on file  Social  Connections: Not on file   Outpatient Encounter Medications as of 06/08/2022  Medication Sig   albuterol (PROVENTIL) (2.5 MG/3ML) 0.083% nebulizer solution Take 2.5 mg by nebulization every 6 (six) hours as needed.   alprazolam (XANAX) 2 MG tablet Take 2 mg by mouth 2 (two) times daily.   apalutamide (ERLEADA) 60 MG tablet Take 4 tablets (240 mg total) by mouth daily. May be taken with or without food. Swallow tablets whole. (Patient not taking: Reported on 06/08/2022)   B Complex Vitamins (VITAMIN B COMPLEX PO) Take by mouth.   Blood Glucose Monitoring Suppl (ACCU-CHEK GUIDE ME) w/Device KIT 1 Piece by Does not apply route as directed.   Cholecalciferol (VITAMIN D) 50 MCG (2000 UT) CAPS Take by mouth daily.   citalopram (CELEXA) 40 MG tablet Take 20-40 mg by mouth daily.   glipiZIDE (GLUCOTROL) 5 MG tablet Take 1 tablet (5 mg total) by mouth 2 (two) times daily before a meal.   glucose blood (ACCU-CHEK GUIDE) test strip Use to test glucose 4 times a day.   milk thistle 175 MG tablet Take 240 mg by mouth daily.   rosuvastatin (CRESTOR)  10 MG tablet Take 1 tablet (10 mg total) by mouth daily.   tamsulosin (FLOMAX) 0.4 MG CAPS capsule Take 1 capsule (0.4 mg total) by mouth in the morning and at bedtime.   vitamin C (ASCORBIC ACID) 500 MG tablet Take 1,000 mg by mouth daily.   vitamin E 1000 UNIT capsule Take 180 Units by mouth daily.   zinc gluconate 50 MG tablet Take 50 mg by mouth daily.   No facility-administered encounter medications on file as of 06/08/2022.    ALLERGIES: Allergies  Allergen Reactions   Sulfa Antibiotics Hives, Itching and Swelling    Tongue swelling   Sulfasalazine Hives, Itching and Swelling    Tongue swelling   Lipitor [Atorvastatin] Other (See Comments)    myalgia   Simvastatin Other (See Comments)    myalgia    VACCINATION STATUS:  There is no immunization history on file for this patient.  Diabetes He presents for his follow-up diabetic visit. He has  type 2 diabetes mellitus. Onset time: He was diagnosed at approximate age of 89 years. His disease course has been stable. There are no hypoglycemic associated symptoms. Pertinent negatives for hypoglycemia include no confusion, headaches, pallor or seizures. Associated symptoms include blurred vision. Pertinent negatives for diabetes include no chest pain, no fatigue, no polydipsia, no polyphagia, no polyuria and no weakness. There are no hypoglycemic complications. Symptoms are stable. Diabetic complications include a CVA. Risk factors for coronary artery disease include diabetes mellitus, hypertension, male sex and tobacco exposure. Current diabetic treatment includes oral agent (monotherapy) (Is currently taking glipizide 10 mg p.o. twice daily.). His weight is fluctuating minimally. He is following a generally unhealthy diet. When asked about meal planning, he reported none. He has not had a previous visit with a dietitian. He never participates in exercise. His home blood glucose trend is fluctuating minimally. His breakfast blood glucose range is generally 130-140 mg/dl. His bedtime blood glucose range is generally 130-140 mg/dl. His overall blood glucose range is 130-140 mg/dl. (He presents with controlled glycemic profile.  His point-of-care A1c is 7.1%.  No hypoglycemia.  ) An ACE inhibitor/angiotensin II receptor blocker is not being taken. Eye exam is current.   Review of systems: Limited as above.    Objective:    BP 120/76   Pulse 76   Ht '5\' 10"'  (1.778 m)   Wt 160 lb 12.8 oz (72.9 kg)   BMI 23.07 kg/m   Wt Readings from Last 3 Encounters:  06/08/22 160 lb 12.8 oz (72.9 kg)  06/06/22 151 lb (68.5 kg)  05/09/22 164 lb 1.6 oz (74.4 kg)       Recent Results (from the past 2160 hour(s))  Urinalysis, Routine w reflex microscopic     Status: None   Collection Time: 04/03/22  1:43 PM  Result Value Ref Range   Specific Gravity, UA 1.010 1.005 - 1.030   pH, UA 6.0 5.0 - 7.5   Color,  UA Yellow Yellow   Appearance Ur Clear Clear   Leukocytes,UA Negative Negative   Protein,UA Negative Negative/Trace   Glucose, UA Negative Negative   Ketones, UA Negative Negative   RBC, UA Negative Negative   Bilirubin, UA Negative Negative   Urobilinogen, Ur 0.2 0.2 - 1.0 mg/dL   Nitrite, UA Negative Negative   Microscopic Examination Comment     Comment: Microscopic follows if indicated.  Urinalysis, Routine w reflex microscopic     Status: None   Collection Time: 05/01/22 11:20 AM  Result Value Ref  Range   Specific Gravity, UA 1.015 1.005 - 1.030   pH, UA 6.0 5.0 - 7.5   Color, UA Yellow Yellow   Appearance Ur Clear Clear   Leukocytes,UA Negative Negative   Protein,UA Negative Negative/Trace   Glucose, UA Negative Negative   Ketones, UA Negative Negative   RBC, UA Negative Negative   Bilirubin, UA Negative Negative   Urobilinogen, Ur 0.2 0.2 - 1.0 mg/dL   Nitrite, UA Negative Negative   Microscopic Examination Comment     Comment: Microscopic follows if indicated.  PSA     Status: None   Collection Time: 05/01/22 11:46 AM  Result Value Ref Range   Prostate Specific Ag, Serum 1.4 0.0 - 4.0 ng/mL    Comment: Roche ECLIA methodology. According to the American Urological Association, Serum PSA should decrease and remain at undetectable levels after radical prostatectomy. The AUA defines biochemical recurrence as an initial PSA value 0.2 ng/mL or greater followed by a subsequent confirmatory PSA value 0.2 ng/mL or greater. Values obtained with different assay methods or kits cannot be used interchangeably. Results cannot be interpreted as absolute evidence of the presence or absence of malignant disease.   Comprehensive metabolic panel     Status: Abnormal   Collection Time: 05/30/22 11:08 AM  Result Value Ref Range   Glucose 148 (H) 70 - 99 mg/dL   BUN 16 8 - 27 mg/dL   Creatinine, Ser 1.23 0.76 - 1.27 mg/dL   eGFR 63 >59 mL/min/1.73   BUN/Creatinine Ratio 13 10 - 24    Sodium 137 134 - 144 mmol/L   Potassium 4.9 3.5 - 5.2 mmol/L   Chloride 100 96 - 106 mmol/L   CO2 22 20 - 29 mmol/L   Calcium 9.8 8.6 - 10.2 mg/dL   Total Protein 7.1 6.0 - 8.5 g/dL   Albumin 4.7 3.7 - 4.7 g/dL   Globulin, Total 2.4 1.5 - 4.5 g/dL   Albumin/Globulin Ratio 2.0 1.2 - 2.2   Bilirubin Total 0.2 0.0 - 1.2 mg/dL   Alkaline Phosphatase 110 44 - 121 IU/L   AST 15 0 - 40 IU/L   ALT 16 0 - 44 IU/L  PSA     Status: None   Collection Time: 05/30/22 11:14 AM  Result Value Ref Range   Prostate Specific Ag, Serum 0.8 0.0 - 4.0 ng/mL    Comment: Roche ECLIA methodology. According to the American Urological Association, Serum PSA should decrease and remain at undetectable levels after radical prostatectomy. The AUA defines biochemical recurrence as an initial PSA value 0.2 ng/mL or greater followed by a subsequent confirmatory PSA value 0.2 ng/mL or greater. Values obtained with different assay methods or kits cannot be used interchangeably. Results cannot be interpreted as absolute evidence of the presence or absence of malignant disease.   Rad [facility] Senter Session Summary     Status: None   Collection Time: 05/30/22  2:14 PM  Result Value Ref Range   Course ID C2_Multi    Course Intent Unknown    Course Start Date 05/18/2022  2:04 PM    Session Number 1    Course First Treatment Date 05/30/2022  2:08 PM    Course Last Treatment Date 05/30/2022  2:12 PM    Course Elapsed Days 0    Reference Point ID Pelvis_UHRT DP    Reference Point Dosage Given to Date 5 Gy   Reference Point Session Dosage Given 5 Gy   Plan ID PelvisAb_UHRT    Plan  Fractions Treated to Date 1    Plan Total Fractions Prescribed 10    Plan Prescribed Dose Per Fraction 5 Gy   Plan Total Prescribed Dose 50.000000 Gy   Plan Primary Reference Point [email or web address] DP   Rad Onc Aria Session Summary     Status: None   Collection Time: 05/31/22  2:51 PM  Result Value Ref Range   Course ID C2_Multi    Course Intent  Unknown    Course Start Date 05/18/2022  2:04 PM    Session Number 2    Course First Treatment Date 05/30/2022  2:08 PM    Course Last Treatment Date 05/31/2022  2:50 PM    Course Elapsed Days 1    Reference Point ID Pelvis_UHRT DP    Reference Point Dosage Given to Date 10 Gy   Reference Point Session Dosage Given 5 Gy   Plan ID PelvisAb_UHRT    Plan Fractions Treated to Date 2    Plan Total Fractions Prescribed 10    Plan Prescribed Dose Per Fraction 5 Gy   Plan Total Prescribed Dose 50.000000 Gy   Plan Primary Reference Point Pelvis_UHRT DP   Rad Onc Aria Session Summary     Status: None   Collection Time: 06/01/22 12:36 PM  Result Value Ref Range   Course ID C2_Multi    Course Intent Unknown    Course Start Date 05/18/2022  2:04 PM    Session Number 3    Course First Treatment Date 05/30/2022  2:08 PM    Course Last Treatment Date 06/01/2022 12:35 PM    Course Elapsed Days 2    Reference Point ID Pelvis_UHRT DP    Reference Point Dosage Given to Date 15 Gy   Reference Point Session Dosage Given 5 Gy   Plan ID PelvisAb_UHRT    Plan Fractions Treated to Date 3    Plan Total Fractions Prescribed 10    Plan Prescribed Dose Per Fraction 5 Gy   Plan Total Prescribed Dose 50.000000 Gy   Plan Primary Reference Point Pelvis_UHRT DP   Rad Onc Aria Session Summary     Status: None   Collection Time: 06/02/22  2:03 PM  Result Value Ref Range   Course ID C2_Multi    Course Intent Unknown    Course Start Date 05/18/2022  2:04 PM    Session Number 4    Course First Treatment Date 05/30/2022  2:08 PM    Course Last Treatment Date 06/02/2022  2:02 PM    Course Elapsed Days 3    Reference Point ID Pelvis_UHRT DP    Reference Point Dosage Given to Date 20 Gy   Reference Point Session Dosage Given 5 Gy   Plan ID PelvisAb_UHRT    Plan Fractions Treated to Date 4    Plan Total Fractions Prescribed 10    Plan Prescribed Dose Per Fraction 5 Gy   Plan Total Prescribed Dose 50.000000 Gy   Plan  Primary Reference Point Pelvis_UHRT DP   Rad Onc Aria Session Summary     Status: None   Collection Time: 06/05/22  2:14 PM  Result Value Ref Range   Course ID C2_Multi    Course Intent Unknown    Course Start Date 05/18/2022  2:04 PM    Session Number 5    Course First Treatment Date 05/30/2022  2:08 PM    Course Last Treatment Date 06/05/2022  2:13 PM    Course Elapsed Days 6    Reference [employer]  ID Pelvis_UHRT DP    Reference Point Dosage Given to Date 25 Gy   Reference Point Session Dosage Given 5 Gy   Plan ID PelvisAb_UHRT    Plan Fractions Treated to Date 5    Plan Total Fractions Prescribed 10    Plan Prescribed Dose Per Fraction 5 Gy   Plan Total Prescribed Dose 50.000000 Gy   Plan Primary Reference Point Pelvis_UHRT DP   Urinalysis, Routine w reflex microscopic     Status: Abnormal   Collection Time: 06/06/22 10:48 AM  Result Value Ref Range   Specific Gravity, UA 1.020 1.005 - 1.030   pH, UA 6.0 5.0 - 7.5   Color, UA Yellow Yellow   Appearance Ur Clear Clear   Leukocytes,UA Negative Negative   Protein,UA 1+ (A) Negative/Trace   Glucose, UA Negative Negative   Ketones, UA Trace (A) Negative   RBC, UA Negative Negative   Bilirubin, UA Negative Negative   Urobilinogen, Ur 0.2 0.2 - 1.0 mg/dL   Nitrite, UA Negative Negative  Rad Onc Aria Session Summary     Status: None   Collection Time: 06/06/22  1:49 PM  Result Value Ref Range   Course ID C2_Multi    Course Intent Unknown    Course Start Date 05/18/2022  2:04 PM    Session Number 6    Course First Treatment Date 05/30/2022  2:08 PM    Course Last Treatment Date 06/06/2022  1:46 PM    Course Elapsed Days 7    Reference Point ID Pelvis_UHRT DP    Reference Point Dosage Given to Date 30 Gy   Reference Point Session Dosage Given 5 Gy   Plan ID PelvisAb_UHRT    Plan Fractions Treated to Date 6    Plan Total Fractions Prescribed 10    Plan Prescribed Dose Per Fraction 5 Gy   Plan Total Prescribed Dose 50.000000 Gy   Plan  Primary Reference Point Pelvis_UHRT DP   Rad Onc Aria Session Summary     Status: None   Collection Time: 06/07/22  2:52 PM  Result Value Ref Range   Course ID C2_Multi    Course Intent Unknown    Course Start Date 05/18/2022  2:04 PM    Session Number 7    Course First Treatment Date 05/30/2022  2:08 PM    Course Last Treatment Date 06/07/2022  2:51 PM    Course Elapsed Days 8    Reference Point ID Pelvis_UHRT DP    Reference Point Dosage Given to Date 35 Gy   Reference Point Session Dosage Given 5 Gy   Plan ID PelvisAb_UHRT    Plan Fractions Treated to Date 7    Plan Total Fractions Prescribed 10    Plan Prescribed Dose Per Fraction 5 Gy   Plan Total Prescribed Dose 50.000000 Gy   Plan Primary Reference Point Pelvis_UHRT DP   HgB A1c     Status: Abnormal   Collection Time: 06/08/22 11:13 AM  Result Value Ref Range   Hemoglobin A1C     HbA1c POC (<> result, manual entry)     HbA1c, POC (prediabetic range)     HbA1c, POC (controlled diabetic range) 7.1 (A) 0.0 - 7.0 %  Rad Onc Aria Session Summary     Status: None   Collection Time: 06/08/22  1:55 PM  Result Value Ref Range   Course ID C2_Multi    Course Intent Unknown    Course Start Date 05/18/2022  2:04 PM  Session Number 8    Course First Treatment Date 05/30/2022  2:08 PM    Course Last Treatment Date 06/08/2022  1:53 PM    Course Elapsed Days 9    Reference Point ID Pelvis_UHRT DP    Reference Point Dosage Given to Date 40 Gy   Reference Point Session Dosage Given 5 Gy   Plan ID PelvisAb_UHRT    Plan Fractions Treated to Date 8    Plan Total Fractions Prescribed 10    Plan Prescribed Dose Per Fraction 5 Gy   Plan Total Prescribed Dose 50.000000 Gy   Plan Primary Reference Point Pelvis_UHRT DP       Assessment & Plan:   1. DM type 2 causing vascular disease ([place])  - Stephen Che. has currently uncontrolled symptomatic type 2 DM since 71 years of age. He presents with controlled glycemic profile.  His  point-of-care A1c is 7.1%.  No hypoglycemia.  Recent labs are reviewed with him.   -his diabetes is complicated by CVA , chronic heavy smoking, and he remains at extremely high risk for more acute and chronic complications which include CAD, CVA, CKD, retinopathy, and neuropathy. These are all discussed in detail with him.  - I have counseled him on diet management by adopting a carbohydrate restricted/protein rich diet.   - he  admits there is a room for improvement in his diet and drink choices. -  Suggestion is made for him to avoid simple carbohydrates  from his diet including Cakes, Sweet Desserts / Pastries, Ice Cream, Soda (diet and regular), Sweet Tea, Candies, Chips, Cookies, Sweet Pastries,  Store Bought Juices, Alcohol in Excess of  1-2 drinks a day, Artificial Sweeteners, Coffee Creamer, and "Sugar-free" Products. This will help patient to have stable blood glucose profile and potentially avoid unintended weight gain.   - I encouraged him to switch to  unprocessed or minimally processed complex starch and increased protein intake (animal or plant source), fruits, and vegetables.  - he is advised to stick to a routine mealtimes to eat 3 meals  a day and avoid unnecessary snacks ( to snack only to correct hypoglycemia).    - I have approached him with the following individualized plan to manage diabetes and patient agrees:   -Based on his presentation with near target glycemic profile and his clinical risk for hypoglycemia, he will not be considered for insulin treatment.    -He is also hesitant to go on injectable treatments.  He is advised to continue glipizide 5 mg p.o. twice daily-daily with breakfast and supper. He agrees to continue monitoring blood glucose at least twice a day-daily before breakfast and at bedtime.  - he is encouraged to call clinic for blood glucose levels less than 70 or above 200 mg /dl.  -He is not a suitable candidate for metformin, SGLT2 inhibitors,  nor incretin therapy. -He will be reapproached for insulin treatment if he loses control by next visit.  - Patient specific target  A1c;  LDL, HDL, Triglycerides  were discussed in detail.  2) BP/HTN:  His blood pressure is controlled to target.   He is not on any antihypertensive medications, reported sulfa allergy.   3) Lipids/HPL: His recent labs show LDL of 83.  He is on Crestor 20 mg po qhs.  4)  Weight/Diet: He was recently diagnosed with lung cancer waiting for radiation therapy.  Is not a candidate for weight loss.   The patient was counseled on the dangers of tobacco  use, and was advised to quit.  Reviewed strategies to maximize success, including removing cigarettes and smoking materials from environment.   5) Chronic Care/Health Maintenance:  -he  Is not on ACEI/ARB and Statin medications and  is encouraged to initiate and continue to follow up with Ophthalmology, Dentist,  Podiatrist at least yearly or according to recommendations, and advised to  Quit smoking (this is his #1health risk). I have recommended yearly flu vaccine and pneumonia vaccine at least every 5 years; moderate intensity exercise for up to 150 minutes weekly; and  sleep for at least 7 hours a day.   - I advised patient to maintain close follow up with [name], PA-C for primary care needs.   I spent 32 minutes in the care of the patient today including review of labs from [place], Lipids, Thyroid Function, Hematology (current and previous including abstractions from other facilities); face-to-face time discussing  his blood glucose readings/logs, discussing hypoglycemia and hyperglycemia episodes and symptoms, medications doses, his options of short and long term treatment based on the latest standards of care / guidelines;  discussion about incorporating lifestyle medicine;  and documenting the encounter.    Please refer to Patient Instructions for Blood Glucose Monitoring and Insulin/Medications  Dosing Guide"  in media tab for additional information. Please  also refer to " Patient Self Inventory" in the Media  tab for reviewed elements of pertinent patient history.  Stephen Che. participated in the discussions, expressed understanding, and voiced agreement with the above plans.  All questions were answered to his satisfaction. he is encouraged to contact clinic should he have any questions or concerns prior to his return visit.   Follow up plan: - Return in about 3 months (around 09/08/2022) for Bring Meter/CGM Device/Logs- A1c in Office.  Glade Lloyd, MD [facility] [address] [place],  [address] Phone: [phone]  Fax: [phone]    06/08/2022, 2:28 PM  This note was partially dictated with voice recognition software. Similar sounding words can be transcribed inadequately or may not  be corrected upon review.

## 2022-06-09 ENCOUNTER — Ambulatory Visit: Payer: Medicare Other | Admitting: "Endocrinology

## 2022-06-09 ENCOUNTER — Ambulatory Visit
Admission: RE | Admit: 2022-06-09 | Discharge: 2022-06-09 | Disposition: A | Discharge status: Home / Self Care | Payer: Medicare Other | Source: Ambulatory Visit | Attending: Radiation Oncology | Admitting: Radiation Oncology

## 2022-06-09 ENCOUNTER — Other Ambulatory Visit: Payer: Self-pay

## 2022-06-09 DIAGNOSIS — C771 Secondary and unspecified malignant neoplasm of intrathoracic lymph nodes: Secondary | ICD-10-CM | POA: Diagnosis not present

## 2022-06-09 DIAGNOSIS — C772 Secondary and unspecified malignant neoplasm of intra-abdominal lymph nodes: Secondary | ICD-10-CM | POA: Diagnosis not present

## 2022-06-09 DIAGNOSIS — Z192 Hormone resistant malignancy status: Secondary | ICD-10-CM | POA: Diagnosis not present

## 2022-06-09 DIAGNOSIS — Z510 Encounter for antineoplastic radiation therapy: Secondary | ICD-10-CM | POA: Diagnosis not present

## 2022-06-09 DIAGNOSIS — C775 Secondary and unspecified malignant neoplasm of intrapelvic lymph nodes: Secondary | ICD-10-CM | POA: Diagnosis not present

## 2022-06-09 DIAGNOSIS — C61 Malignant neoplasm of prostate: Secondary | ICD-10-CM | POA: Diagnosis not present

## 2022-06-09 LAB — RAD ONC ARIA SESSION SUMMARY
Course Elapsed Days: 10
Plan Fractions Treated to Date: 9
Plan Prescribed Dose Per Fraction: 5 Gy
Plan Total Fractions Prescribed: 10
Plan Total Prescribed Dose: 50 Gy
Reference Point Dosage Given to Date: 45 Gy
Reference Point Session Dosage Given: 5 Gy
Session Number: 9

## 2022-06-12 ENCOUNTER — Other Ambulatory Visit: Payer: Self-pay

## 2022-06-12 ENCOUNTER — Encounter: Payer: Self-pay | Admitting: Licensed Clinical Social Worker

## 2022-06-12 ENCOUNTER — Ambulatory Visit
Admission: RE | Admit: 2022-06-12 | Discharge: 2022-06-12 | Disposition: A | Discharge status: Home / Self Care | Payer: Medicare Other | Source: Ambulatory Visit | Attending: Radiation Oncology | Admitting: Radiation Oncology

## 2022-06-12 ENCOUNTER — Inpatient Hospital Stay: Payer: Medicare Other

## 2022-06-12 ENCOUNTER — Inpatient Hospital Stay: Payer: Medicare Other | Attending: Genetics | Admitting: Licensed Clinical Social Worker

## 2022-06-12 ENCOUNTER — Other Ambulatory Visit: Payer: Self-pay | Admitting: Licensed Clinical Social Worker

## 2022-06-12 DIAGNOSIS — C775 Secondary and unspecified malignant neoplasm of intrapelvic lymph nodes: Secondary | ICD-10-CM | POA: Diagnosis not present

## 2022-06-12 DIAGNOSIS — Z79899 Other long term (current) drug therapy: Secondary | ICD-10-CM | POA: Insufficient documentation

## 2022-06-12 DIAGNOSIS — Z192 Hormone resistant malignancy status: Secondary | ICD-10-CM | POA: Diagnosis not present

## 2022-06-12 DIAGNOSIS — E785 Hyperlipidemia, unspecified: Secondary | ICD-10-CM | POA: Insufficient documentation

## 2022-06-12 DIAGNOSIS — Z808 Family history of malignant neoplasm of other organs or systems: Secondary | ICD-10-CM | POA: Insufficient documentation

## 2022-06-12 DIAGNOSIS — C772 Secondary and unspecified malignant neoplasm of intra-abdominal lymph nodes: Secondary | ICD-10-CM | POA: Diagnosis not present

## 2022-06-12 DIAGNOSIS — C61 Malignant neoplasm of prostate: Secondary | ICD-10-CM | POA: Diagnosis not present

## 2022-06-12 DIAGNOSIS — Z803 Family history of malignant neoplasm of breast: Secondary | ICD-10-CM | POA: Insufficient documentation

## 2022-06-12 DIAGNOSIS — Z8249 Family history of ischemic heart disease and other diseases of the circulatory system: Secondary | ICD-10-CM | POA: Insufficient documentation

## 2022-06-12 DIAGNOSIS — Z510 Encounter for antineoplastic radiation therapy: Secondary | ICD-10-CM | POA: Diagnosis not present

## 2022-06-12 LAB — CBC WITH DIFFERENTIAL/PLATELET
Abs Immature Granulocytes: 0.05 10*3/uL (ref 0.00–0.07)
Basophils Absolute: 0 10*3/uL (ref 0.0–0.1)
Basophils Relative: 0 %
Eosinophils Absolute: 0.3 10*3/uL (ref 0.0–0.5)
Eosinophils Relative: 4 %
HCT: 42.5 % (ref 39.0–52.0)
Hemoglobin: 14.3 g/dL (ref 13.0–17.0)
Immature Granulocytes: 1 %
Lymphocytes Relative: 7 %
Lymphs Abs: 0.5 10*3/uL — ABNORMAL LOW (ref 0.7–4.0)
MCH: 30.8 pg (ref 26.0–34.0)
MCHC: 33.6 g/dL (ref 30.0–36.0)
MCV: 91.4 fL (ref 80.0–100.0)
Monocytes Absolute: 0.6 10*3/uL (ref 0.1–1.0)
Monocytes Relative: 9 %
Neutro Abs: 5.4 10*3/uL (ref 1.7–7.7)
Neutrophils Relative %: 79 %
Platelets: 133 10*3/uL — ABNORMAL LOW (ref 150–400)
RBC: 4.65 MIL/uL (ref 4.22–5.81)
RDW: 13.5 % (ref 11.5–15.5)
WBC: 6.9 10*3/uL (ref 4.0–10.5)
nRBC: 0 % (ref 0.0–0.2)

## 2022-06-12 LAB — GENETIC SCREENING ORDER

## 2022-06-12 LAB — RAD ONC ARIA SESSION SUMMARY
Course Elapsed Days: 13
Plan Fractions Treated to Date: 10
Plan Prescribed Dose Per Fraction: 5 Gy
Plan Total Fractions Prescribed: 10
Plan Total Prescribed Dose: 50 Gy
Reference Point Dosage Given to Date: 50 Gy
Reference Point Session Dosage Given: 5 Gy
Session Number: 10

## 2022-06-12 LAB — COMPREHENSIVE METABOLIC PANEL
ALT: 11 U/L (ref 0–44)
AST: 12 U/L — ABNORMAL LOW (ref 15–41)
Albumin: 4.3 g/dL (ref 3.5–5.0)
Alkaline Phosphatase: 72 U/L (ref 38–126)
Anion gap: 7 (ref 5–15)
BUN: 15 mg/dL (ref 8–23)
CO2: 25 mmol/L (ref 22–32)
Calcium: 9.6 mg/dL (ref 8.9–10.3)
Chloride: 103 mmol/L (ref 98–111)
Creatinine, Ser: 1.04 mg/dL (ref 0.61–1.24)
GFR, Estimated: >60 mL/min (ref >60)
Glucose, Bld: 140 mg/dL — ABNORMAL HIGH (ref 70–99)
Potassium: 4.5 mmol/L (ref 3.5–5.1)
Sodium: 135 mmol/L (ref 135–145)
Total Bilirubin: 0.4 mg/dL (ref 0.3–1.2)
Total Protein: 7.4 g/dL (ref 6.5–8.1)

## 2022-06-12 NOTE — Progress Notes (Signed)
REFERRING PROVIDER: Doreatha Massed, MD [address] [place],  [place] [address]  PRIMARY PROVIDER:  Avis Epley, PA-C  PRIMARY REASON FOR VISIT:  1. Prostate cancer metastatic to intrapelvic lymph node (HCC)      HISTORY OF PRESENT ILLNESS:   Stephen Palmer, a 71 y.o. male, was seen for a [place] cancer genetics consultation at the request of Dr. Ellin Saba due to a personal and family history of cancer.  Stephen Palmer presents to clinic today to discuss the possibility of a hereditary predisposition to cancer, genetic testing, and to further clarify his future cancer risks, as well as potential cancer risks for family members.   Stephen Palmer was diagnosed with prostatic adenocarcinoma, Gleason 4+5=9, in 2017. He is currently being treated with radiation.  He was also diagnosed with squamous cell carcinoma of the lung, stage I, treated with SBRT.   CANCER HISTORY:  Oncology History   No history exists.   Past Medical History:  Diagnosis Date   Abdominal aortic aneurysm (AAA) (HCC)    Anxiety    COPD (chronic obstructive pulmonary disease) (HCC)    Coronary artery calcification seen on CT scan    Essential hypertension    Lung cancer (HCC)    Non small cell carcinoma - XRT   Non-small cell carcinoma of left lung, stage 1 (HCC) 09/21/2017   Prostate cancer (HCC)    XRT   Stroke (HCC)    Type 2 diabetes mellitus (HCC)    FAMILY HISTORY:  We obtained a detailed, 4-generation family history.  Significant diagnoses are listed below: Family History  Problem Relation Age of Onset   Heart disease Mother    Skin cancer Mother    Heart disease Father        before age 76   Skin cancer Brother    Breast cancer Maternal Aunt    Cancer Neg Hx     Stephen Palmer had 1 daughter, 1 son. His son passed due to a heart attack. Stephen Palmer had 2 full brothers, 1 full sister, and several paternal half siblings. His brother has had skin cancer.  Stephen Palmer's mother had skin cancer. Patient had  3 maternal aunts, 3 maternal uncles. One aunt had breast cancer. No other known cancers on this side of the family.  Stephen Palmer's father died at 49. Patient has limited information about this side of the family, but thinks some of his aunts had breast cancer.   Stephen Palmer is unaware of previous family history of genetic testing for hereditary cancer risks. There is no reported Ashkenazi Jewish ancestry. There is no known consanguinity.    GENETIC COUNSELING ASSESSMENT: Stephen Palmer is a 71 y.o. male with a personal history of metastatic prostate cancer which is somewhat suggestive of a hereditary cancer syndrome and predisposition to cancer. We, therefore, discussed and recommended the following at today's visit.   DISCUSSION: We discussed that approximately 10% of prostate cancer is hereditary. Most cases of hereditary prostate cancer are associated with BRCA1/BRCA2 genes, although there are other genes associated with hereditary cancer as well. Cancers and risks are gene specific. We discussed that testing is beneficial for several reasons including knowing about cancer risks, identifying potential screening and risk-reduction options that may be appropriate, and to understand if other family members could be at risk for cancer and allow them to undergo genetic testing.   We reviewed the characteristics, features and inheritance patterns of hereditary cancer syndromes. We also discussed genetic testing, including the  appropriate family members to test, the process of testing, insurance coverage and turn-around-time for results. We discussed the implications of a negative, positive and/or variant of uncertain significant result. We recommended Stephen Palmer pursue genetic testing for the [facility] Common Hereditary Cancers+RNA gene panel.   Based on Stephen Palmer's personal history of cancer, he meets medical criteria for genetic testing. Despite that he meets criteria, he may still have an out of pocket cost. We  discussed that if his out of pocket cost for testing is over $100, the laboratory will call and confirm whether he wants to proceed with testing.  If the out of pocket cost of testing is less than $100 he will be billed by the genetic testing laboratory.   PLAN: After considering the risks, benefits, and limitations, Stephen Palmer provided informed consent to pursue genetic testing and the blood sample was sent to [facility] for analysis of the Common Hereditary Cancers+RNA panel. Results should be available within approximately 2-3 weeks' time, at which point they will be disclosed by telephone to Stephen Palmer, as will any additional recommendations warranted by these results. Stephen Palmer will receive a summary of his genetic counseling visit and a copy of his results once available. This information will also be available in Epic.   Stephen Palmer's questions were answered to his satisfaction today. Our contact information was provided should additional questions or concerns arise. Thank you for the referral and allowing [place] to share in the care of your patient.   Lacy Duverney, MS, [facility] Genetic Counselor [email or web address] Phone: [phone]  The patient was seen for a total of 30 minutes in face-to-face genetic counseling. Patient was seen with his wife.  Dr. Orlie Dakin was available for discussion regarding this case.   _______________________________________________________________________ For Office Staff:  Number of people involved in session: 2 Was an Intern/ student involved with case: no

## 2022-06-13 LAB — PSA, TOTAL AND FREE
PSA, Free Pct: 65.4 %
PSA, Free: 0.85 ng/mL
Prostate Specific Ag, Serum: 1.3 ng/mL (ref 0.0–4.0)

## 2022-06-16 ENCOUNTER — Other Ambulatory Visit: Payer: Self-pay | Admitting: "Endocrinology

## 2022-06-21 ENCOUNTER — Other Ambulatory Visit: Payer: Self-pay

## 2022-06-21 ENCOUNTER — Ambulatory Visit
Admission: RE | Admit: 2022-06-21 | Discharge: 2022-06-21 | Disposition: A | Discharge status: Home / Self Care | Payer: Medicare Other | Source: Ambulatory Visit | Attending: Radiation Oncology | Admitting: Radiation Oncology

## 2022-06-21 DIAGNOSIS — C61 Malignant neoplasm of prostate: Secondary | ICD-10-CM | POA: Insufficient documentation

## 2022-06-21 DIAGNOSIS — C775 Secondary and unspecified malignant neoplasm of intrapelvic lymph nodes: Secondary | ICD-10-CM | POA: Diagnosis not present

## 2022-06-21 DIAGNOSIS — C771 Secondary and unspecified malignant neoplasm of intrathoracic lymph nodes: Secondary | ICD-10-CM | POA: Insufficient documentation

## 2022-06-21 DIAGNOSIS — Z510 Encounter for antineoplastic radiation therapy: Secondary | ICD-10-CM | POA: Diagnosis not present

## 2022-06-21 DIAGNOSIS — Z192 Hormone resistant malignancy status: Secondary | ICD-10-CM | POA: Diagnosis not present

## 2022-06-21 LAB — RAD ONC ARIA SESSION SUMMARY
Course Elapsed Days: 22
Plan Fractions Treated to Date: 1
Plan Prescribed Dose Per Fraction: 8 Gy
Plan Total Fractions Prescribed: 5
Plan Total Prescribed Dose: 40 Gy
Reference Point Dosage Given to Date: 8 Gy
Reference Point Session Dosage Given: 8 Gy
Session Number: 11

## 2022-06-22 ENCOUNTER — Ambulatory Visit: Payer: Medicare Other

## 2022-06-23 ENCOUNTER — Ambulatory Visit: Payer: Medicare Other

## 2022-06-23 ENCOUNTER — Ambulatory Visit
Admission: RE | Admit: 2022-06-23 | Discharge: 2022-06-23 | Disposition: A | Discharge status: Home / Self Care | Payer: Medicare Other | Source: Ambulatory Visit | Attending: Radiation Oncology | Admitting: Radiation Oncology

## 2022-06-23 ENCOUNTER — Other Ambulatory Visit: Payer: Self-pay

## 2022-06-23 DIAGNOSIS — C775 Secondary and unspecified malignant neoplasm of intrapelvic lymph nodes: Secondary | ICD-10-CM | POA: Diagnosis not present

## 2022-06-23 DIAGNOSIS — Z510 Encounter for antineoplastic radiation therapy: Secondary | ICD-10-CM | POA: Diagnosis not present

## 2022-06-23 DIAGNOSIS — Z192 Hormone resistant malignancy status: Secondary | ICD-10-CM | POA: Diagnosis not present

## 2022-06-23 DIAGNOSIS — C771 Secondary and unspecified malignant neoplasm of intrathoracic lymph nodes: Secondary | ICD-10-CM | POA: Diagnosis not present

## 2022-06-23 DIAGNOSIS — C61 Malignant neoplasm of prostate: Secondary | ICD-10-CM | POA: Diagnosis not present

## 2022-06-23 LAB — RAD ONC ARIA SESSION SUMMARY
Course Elapsed Days: 24
Plan Fractions Treated to Date: 2
Plan Prescribed Dose Per Fraction: 8 Gy
Plan Total Fractions Prescribed: 5
Plan Total Prescribed Dose: 40 Gy
Reference Point Dosage Given to Date: 16 Gy
Reference Point Session Dosage Given: 8 Gy
Session Number: 12

## 2022-06-26 ENCOUNTER — Other Ambulatory Visit: Payer: Self-pay

## 2022-06-26 ENCOUNTER — Encounter: Payer: Self-pay | Admitting: Licensed Clinical Social Worker

## 2022-06-26 ENCOUNTER — Telehealth: Payer: Self-pay | Admitting: Licensed Clinical Social Worker

## 2022-06-26 ENCOUNTER — Ambulatory Visit: Payer: Self-pay | Admitting: Licensed Clinical Social Worker

## 2022-06-26 ENCOUNTER — Ambulatory Visit
Admission: RE | Admit: 2022-06-26 | Discharge: 2022-06-26 | Disposition: A | Discharge status: Home / Self Care | Payer: Medicare Other | Source: Ambulatory Visit | Attending: Radiation Oncology | Admitting: Radiation Oncology

## 2022-06-26 DIAGNOSIS — Z1379 Encounter for other screening for genetic and chromosomal anomalies: Secondary | ICD-10-CM | POA: Insufficient documentation

## 2022-06-26 DIAGNOSIS — C775 Secondary and unspecified malignant neoplasm of intrapelvic lymph nodes: Secondary | ICD-10-CM | POA: Diagnosis not present

## 2022-06-26 DIAGNOSIS — C61 Malignant neoplasm of prostate: Secondary | ICD-10-CM | POA: Diagnosis not present

## 2022-06-26 DIAGNOSIS — C771 Secondary and unspecified malignant neoplasm of intrathoracic lymph nodes: Secondary | ICD-10-CM | POA: Diagnosis not present

## 2022-06-26 DIAGNOSIS — Z510 Encounter for antineoplastic radiation therapy: Secondary | ICD-10-CM | POA: Diagnosis not present

## 2022-06-26 DIAGNOSIS — Z192 Hormone resistant malignancy status: Secondary | ICD-10-CM | POA: Diagnosis not present

## 2022-06-26 LAB — RAD ONC ARIA SESSION SUMMARY
Course Elapsed Days: 27
Plan Fractions Treated to Date: 3
Plan Prescribed Dose Per Fraction: 8 Gy
Plan Total Fractions Prescribed: 5
Plan Total Prescribed Dose: 40 Gy
Reference Point Dosage Given to Date: 24 Gy
Reference Point Session Dosage Given: 8 Gy
Session Number: 13

## 2022-06-26 NOTE — Progress Notes (Signed)
HPI:  [name] was previously seen in the [place] clinic due to a personal history of metastatic prostate cancer and concerns regarding a hereditary predisposition to cancer. Please refer to our prior cancer genetics clinic note for more information regarding our discussion, assessment and recommendations, at the time. [name] recent genetic test results were disclosed to him, as were recommendations warranted by these results. These results and recommendations are discussed in more detail below.  CANCER HISTORY:  Oncology History  Prostate cancer metastatic to intrapelvic lymph node ([place])  09/29/2016 Initial Diagnosis   Prostate cancer metastatic to intrapelvic lymph node (HCC)    Genetic Testing   Negative genetic testing. No pathogenic variants identified on the Common Hereditary Cancers+RNA panel. The report date is 06/23/2022.  The Common Hereditary Cancers Panel + RNA offered by [facility] includes sequencing and/or deletion duplication testing of the following 47 genes: APC, ATM, AXIN2, BARD1, BMPR1A, BRCA1, BRCA2, BRIP1, CDH1, CDKN2A (p14ARF), CDKN2A (p16INK4a), CKD4, CHEK2, CTNNA1, DICER1, EPCAM (Deletion/duplication testing only), GREM1 (promoter region deletion/duplication testing only), KIT, MEN1, MLH1, MSH2, MSH3, MSH6, MUTYH, NBN, NF1, NHTL1, PALB2, PDGFRA, PMS2, POLD1, POLE, PTEN, RAD50, RAD51C, RAD51D, SDHB, SDHC, SDHD, SMAD4, SMARCA4. STK11, TP53, TSC1, TSC2, and VHL.  The following genes were evaluated for sequence changes only: SDHA and HOXB13 c.251G>A variant only.     FAMILY HISTORY:  We obtained a detailed, 4-generation family history.  Significant diagnoses are listed below: Family History  Problem Relation Age of Onset   Heart disease Mother    Skin cancer Mother    Heart disease Father        before age 67   Skin cancer Brother    Breast cancer Maternal Aunt    Cancer Neg Hx     [name] had 1 daughter, 1 son. His son passed due to a heart  attack. [name] had 2 full brothers, 1 full sister, and several paternal half siblings. His brother has had skin cancer.   [name] mother had skin cancer. Patient had 3 maternal aunts, 3 maternal uncles. One aunt had breast cancer. No other known cancers on this side of the family.   [name] father died at 45. Patient has limited information about this side of the family, but thinks some of his aunts had breast cancer.    [name] is unaware of previous family history of genetic testing for hereditary cancer risks. There is no reported Ashkenazi Jewish ancestry. There is no known consanguinity.    GENETIC TEST RESULTS: Genetic testing reported out on 06/23/2022 through the [facility] Common Hereditary Cancers+RNA cancer panel found no pathogenic mutations.   The Common Hereditary Cancers Panel + RNA offered by [facility] includes sequencing and/or deletion duplication testing of the following 47 genes: APC, ATM, AXIN2, BARD1, BMPR1A, BRCA1, BRCA2, BRIP1, CDH1, CDKN2A (p14ARF), CDKN2A (p16INK4a), CKD4, CHEK2, CTNNA1, DICER1, EPCAM (Deletion/duplication testing only), GREM1 (promoter region deletion/duplication testing only), KIT, MEN1, MLH1, MSH2, MSH3, MSH6, MUTYH, NBN, NF1, NHTL1, PALB2, PDGFRA, PMS2, POLD1, POLE, PTEN, RAD50, RAD51C, RAD51D, SDHB, SDHC, SDHD, SMAD4, SMARCA4. STK11, TP53, TSC1, TSC2, and VHL.  The following genes were evaluated for sequence changes only: SDHA and HOXB13 c.251G>A variant only.   The test report has been scanned into EPIC and is located under the Molecular Pathology section of the Results Review tab.  A portion of the result report is included below for reference.     We discussed that because current genetic testing is not perfect, it is  possible there may be a gene mutation in one of these genes that current testing cannot detect, but that chance is small.  There could be another gene that has not yet been discovered, or that we have not yet tested, that is  responsible for the cancer diagnoses in the family. It is also possible there is a hereditary cause for the cancer in the family that [name] did not inherit and therefore was not identified in his testing.  Therefore, it is important to remain in touch with cancer genetics in the future so that we can continue to offer [name] the most up to date genetic testing.   ADDITIONAL GENETIC TESTING: We discussed with [name] that his genetic testing was fairly extensive.  If there are genes identified to increase cancer risk that can be analyzed in the future, we would be happy to discuss and coordinate this testing at that time.    CANCER SCREENING RECOMMENDATIONS: [name] test result is considered negative (normal).  This means that we have not identified a hereditary cause for his  personal and family history of cancer at this time. Most cancers happen by chance and this negative test suggests that his cancer may fall into this category.    While reassuring, this does not definitively rule out a hereditary predisposition to cancer. It is still possible that there could be genetic mutations that are undetectable by current technology. There could be genetic mutations in genes that have not been tested or identified to increase cancer risk.  Therefore, it is recommended he continue to follow the cancer management and screening guidelines provided by his oncology and primary healthcare provider.   An individual's cancer risk and medical management are not determined by genetic test results alone. Overall cancer risk assessment incorporates additional factors, including personal medical history, family history, and any available genetic information that may result in a personalized plan for cancer prevention and surveillance.  RECOMMENDATIONS FOR FAMILY MEMBERS:  Relatives in this family might be at some increased risk of developing cancer, over the general population risk, simply due to the family  history of cancer.  We recommended male relatives in this family have a yearly mammogram beginning at age 85, or 63 years younger than the earliest onset of cancer, an annual clinical breast exam, and perform monthly breast self-exams. Male relatives in this family should also have a gynecological exam as recommended by their primary provider.  All family members should be referred for colonoscopy starting at age 5.   FOLLOW-UP: Lastly, we discussed with [name] that cancer genetics is a rapidly advancing field and it is possible that new genetic tests will be appropriate for him and/or his family members in the future. We encouraged him to remain in contact with cancer genetics on an annual basis so we can update his personal and family histories and let him know of advances in cancer genetics that may benefit this family.   Our contact number was provided. [name]'s questions were answered to his satisfaction, and he knows he is welcome to call us at anytime with additional questions or concerns.   Faith Rogue, MS, [facility] Genetic Counselor [email or web address] Phone: [phone]

## 2022-06-26 NOTE — Telephone Encounter (Signed)
Revealed negative genetic testing.   This normal result is reassuring and indicates that it is unlikely Stephen Palmer's cancer is due to a hereditary cause.  It is unlikely that there is an increased risk of another cancer due to a mutation in one of these genes.  However, genetic testing is not perfect, and cannot definitively rule out a hereditary cause.  It will be important for him to keep in contact with genetics to learn if any additional testing may be needed in the future.

## 2022-06-27 ENCOUNTER — Ambulatory Visit: Payer: Medicare Other

## 2022-06-28 ENCOUNTER — Other Ambulatory Visit: Payer: Self-pay

## 2022-06-28 ENCOUNTER — Ambulatory Visit
Admission: RE | Admit: 2022-06-28 | Discharge: 2022-06-28 | Disposition: A | Discharge status: Home / Self Care | Payer: Medicare Other | Source: Ambulatory Visit | Attending: Radiation Oncology | Admitting: Radiation Oncology

## 2022-06-28 DIAGNOSIS — C775 Secondary and unspecified malignant neoplasm of intrapelvic lymph nodes: Secondary | ICD-10-CM | POA: Diagnosis not present

## 2022-06-28 DIAGNOSIS — C771 Secondary and unspecified malignant neoplasm of intrathoracic lymph nodes: Secondary | ICD-10-CM | POA: Diagnosis not present

## 2022-06-28 DIAGNOSIS — C61 Malignant neoplasm of prostate: Secondary | ICD-10-CM

## 2022-06-28 DIAGNOSIS — Z510 Encounter for antineoplastic radiation therapy: Secondary | ICD-10-CM | POA: Diagnosis not present

## 2022-06-28 DIAGNOSIS — Z192 Hormone resistant malignancy status: Secondary | ICD-10-CM | POA: Diagnosis not present

## 2022-06-28 LAB — RAD ONC ARIA SESSION SUMMARY
Course Elapsed Days: 29
Plan Fractions Treated to Date: 4
Plan Prescribed Dose Per Fraction: 8 Gy
Plan Total Fractions Prescribed: 5
Plan Total Prescribed Dose: 40 Gy
Reference Point Dosage Given to Date: 32 Gy
Reference Point Session Dosage Given: 8 Gy
Session Number: 14

## 2022-06-30 ENCOUNTER — Ambulatory Visit
Admission: RE | Admit: 2022-06-30 | Discharge: 2022-06-30 | Disposition: A | Discharge status: Home / Self Care | Payer: Medicare Other | Source: Ambulatory Visit | Attending: Radiation Oncology | Admitting: Radiation Oncology

## 2022-06-30 ENCOUNTER — Encounter: Payer: Self-pay | Admitting: Radiation Oncology

## 2022-06-30 ENCOUNTER — Other Ambulatory Visit: Payer: Self-pay

## 2022-06-30 DIAGNOSIS — C61 Malignant neoplasm of prostate: Secondary | ICD-10-CM | POA: Diagnosis not present

## 2022-06-30 DIAGNOSIS — C771 Secondary and unspecified malignant neoplasm of intrathoracic lymph nodes: Secondary | ICD-10-CM | POA: Diagnosis not present

## 2022-06-30 DIAGNOSIS — Z510 Encounter for antineoplastic radiation therapy: Secondary | ICD-10-CM | POA: Diagnosis not present

## 2022-06-30 DIAGNOSIS — Z192 Hormone resistant malignancy status: Secondary | ICD-10-CM | POA: Diagnosis not present

## 2022-06-30 DIAGNOSIS — C775 Secondary and unspecified malignant neoplasm of intrapelvic lymph nodes: Secondary | ICD-10-CM | POA: Diagnosis not present

## 2022-06-30 LAB — RAD ONC ARIA SESSION SUMMARY
Course Elapsed Days: 31
Plan Fractions Treated to Date: 5
Plan Prescribed Dose Per Fraction: 8 Gy
Plan Total Fractions Prescribed: 5
Plan Total Prescribed Dose: 40 Gy
Reference Point Dosage Given to Date: 40 Gy
Reference Point Session Dosage Given: 8 Gy
Session Number: 15

## 2022-07-04 ENCOUNTER — Telehealth: Payer: Self-pay | Admitting: Urology

## 2022-07-04 NOTE — Telephone Encounter (Signed)
Patient requesting financial assistance with [facility] - too expensive.   Can we reach out to the rep and see what options are available for this patient?  Patient can be reached at [phone].

## 2022-07-04 NOTE — Telephone Encounter (Signed)
Please Advised

## 2022-07-05 ENCOUNTER — Inpatient Hospital Stay (HOSPITAL_COMMUNITY): Payer: Medicare Other | Attending: Hematology

## 2022-07-05 DIAGNOSIS — Z808 Family history of malignant neoplasm of other organs or systems: Secondary | ICD-10-CM | POA: Diagnosis not present

## 2022-07-05 DIAGNOSIS — Z803 Family history of malignant neoplasm of breast: Secondary | ICD-10-CM | POA: Insufficient documentation

## 2022-07-05 DIAGNOSIS — F17210 Nicotine dependence, cigarettes, uncomplicated: Secondary | ICD-10-CM | POA: Insufficient documentation

## 2022-07-05 DIAGNOSIS — C775 Secondary and unspecified malignant neoplasm of intrapelvic lymph nodes: Secondary | ICD-10-CM | POA: Diagnosis not present

## 2022-07-05 DIAGNOSIS — Z85118 Personal history of other malignant neoplasm of bronchus and lung: Secondary | ICD-10-CM | POA: Diagnosis not present

## 2022-07-05 DIAGNOSIS — C61 Malignant neoplasm of prostate: Secondary | ICD-10-CM | POA: Diagnosis not present

## 2022-07-05 DIAGNOSIS — M8580 Other specified disorders of bone density and structure, unspecified site: Secondary | ICD-10-CM | POA: Diagnosis not present

## 2022-07-05 LAB — CBC WITH DIFFERENTIAL/PLATELET
Abs Immature Granulocytes: 0.03 10*3/uL (ref 0.00–0.07)
Basophils Absolute: 0 10*3/uL (ref 0.0–0.1)
Basophils Relative: 0 %
Eosinophils Absolute: 0.1 10*3/uL (ref 0.0–0.5)
Eosinophils Relative: 1 %
HCT: 37.6 % — ABNORMAL LOW (ref 39.0–52.0)
Hemoglobin: 12.3 g/dL — ABNORMAL LOW (ref 13.0–17.0)
Immature Granulocytes: 0 %
Lymphocytes Relative: 6 %
Lymphs Abs: 0.5 10*3/uL — ABNORMAL LOW (ref 0.7–4.0)
MCH: 31 pg (ref 26.0–34.0)
MCHC: 32.7 g/dL (ref 30.0–36.0)
MCV: 94.7 fL (ref 80.0–100.0)
Monocytes Absolute: 0.6 10*3/uL (ref 0.1–1.0)
Monocytes Relative: 8 %
Neutro Abs: 6 10*3/uL (ref 1.7–7.7)
Neutrophils Relative %: 85 %
Platelets: 167 10*3/uL (ref 150–400)
RBC: 3.97 MIL/uL — ABNORMAL LOW (ref 4.22–5.81)
RDW: 14.4 % (ref 11.5–15.5)
WBC: 7.2 10*3/uL (ref 4.0–10.5)
nRBC: 0 % (ref 0.0–0.2)

## 2022-07-05 LAB — COMPREHENSIVE METABOLIC PANEL
ALT: 21 U/L (ref 0–44)
AST: 18 U/L (ref 15–41)
Albumin: 4.1 g/dL (ref 3.5–5.0)
Alkaline Phosphatase: 74 U/L (ref 38–126)
Anion gap: 7 (ref 5–15)
BUN: 18 mg/dL (ref 8–23)
CO2: 27 mmol/L (ref 22–32)
Calcium: 9.4 mg/dL (ref 8.9–10.3)
Chloride: 103 mmol/L (ref 98–111)
Creatinine, Ser: 1.17 mg/dL (ref 0.61–1.24)
GFR, Estimated: >60 mL/min (ref >60)
Glucose, Bld: 175 mg/dL — ABNORMAL HIGH (ref 70–99)
Potassium: 4.9 mmol/L (ref 3.5–5.1)
Sodium: 137 mmol/L (ref 135–145)
Total Bilirubin: 0.4 mg/dL (ref 0.3–1.2)
Total Protein: 7.5 g/dL (ref 6.5–8.1)

## 2022-07-05 LAB — PSA: Prostatic Specific Antigen: 1.37 ng/mL (ref 0.00–4.00)

## 2022-07-05 NOTE — Telephone Encounter (Signed)
Patient called with no answer. Detailed message left stating that patient's medication has been approved through [place] [employer] since April/2023. Patient need to call their pharmacy ([facility]) @ [phone] to request a refill.

## 2022-07-06 NOTE — Telephone Encounter (Signed)
Patient called the number provided and they were not able to help him.  He is needing some assistance with the medication.

## 2022-07-12 ENCOUNTER — Other Ambulatory Visit (HOSPITAL_COMMUNITY): Payer: Self-pay

## 2022-07-12 ENCOUNTER — Inpatient Hospital Stay (HOSPITAL_BASED_OUTPATIENT_CLINIC_OR_DEPARTMENT_OTHER): Payer: Medicare Other | Admitting: Hematology

## 2022-07-12 ENCOUNTER — Telehealth: Payer: Self-pay | Admitting: Pharmacy Technician

## 2022-07-12 ENCOUNTER — Telehealth (HOSPITAL_COMMUNITY): Payer: Self-pay | Admitting: Pharmacist

## 2022-07-12 VITALS — BP 109/71 | HR 81 | Temp 97.8°F | Resp 18 | Ht 70.0 in | Wt 150.2 lb

## 2022-07-12 DIAGNOSIS — Z803 Family history of malignant neoplasm of breast: Secondary | ICD-10-CM | POA: Diagnosis not present

## 2022-07-12 DIAGNOSIS — M8580 Other specified disorders of bone density and structure, unspecified site: Secondary | ICD-10-CM | POA: Diagnosis not present

## 2022-07-12 DIAGNOSIS — C61 Malignant neoplasm of prostate: Secondary | ICD-10-CM

## 2022-07-12 DIAGNOSIS — C775 Secondary and unspecified malignant neoplasm of intrapelvic lymph nodes: Secondary | ICD-10-CM | POA: Diagnosis not present

## 2022-07-12 DIAGNOSIS — F17210 Nicotine dependence, cigarettes, uncomplicated: Secondary | ICD-10-CM | POA: Diagnosis not present

## 2022-07-12 DIAGNOSIS — Z85118 Personal history of other malignant neoplasm of bronchus and lung: Secondary | ICD-10-CM | POA: Diagnosis not present

## 2022-07-12 MED ORDER — PREDNISONE 5 MG PO TABS: 5.0000 mg | ORAL_TABLET | Freq: Every day | ORAL | 6 refills | Status: DC

## 2022-07-12 MED ORDER — ABIRATERONE ACETATE 250 MG PO TABS
1000.0000 mg | ORAL_TABLET | Freq: Every day | ORAL | 2 refills | Status: DC
  Filled 2022-07-12 – 2022-07-13 (×2): qty 120, 30d supply, fill #0
  Filled 2022-08-01: qty 120, 30d supply, fill #1
  Filled 2022-08-31 – 2022-09-06 (×2): qty 120, 30d supply, fill #2

## 2022-07-12 NOTE — Progress Notes (Signed)
Stephen Palmer, [place] [address]   CLINIC:  Medical Oncology/Hematology  PCP:  [name], PA-C [address] 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 No pathogenic variants identified on the Common Hereditary Cancers+RNA panel. The report date is 06/23/2022.  The Common Hereditary Cancers Panel + RNA offered by [facility] includes sequencing and/or deletion duplication testing of the following 47 genes: APC, ATM, AXIN2, BARD1, BMPR1A, BRCA1, BRCA2, BRIP1, CDH1, CDKN2A (p14ARF), CDKN2A (p16INK4a), CKD4, CHEK2, CTNNA1, DICER1, EPCAM (Deletion/duplication testing only), GREM1 (promoter region deletion/duplication testing only), KIT, MEN1, MLH1, MSH2, MSH3, MSH6, MUTYH, NBN, NF1, NHTL1, PALB2, PDGFRA, PMS2, POLD1, POLE, PTEN, RAD50, RAD51C, RAD51D, SDHB, SDHC, SDHD, SMAD4, SMARCA4. STK11, TP53, TSC1, TSC2, and VHL.  The following genes were evaluated for sequence changes only: SDHA and HOXB13 c.251G>A variant only.     CANCER STAGING:  Cancer Staging  Prostate cancer metastatic to intrapelvic lymph node ([place]) Staging form: Prostate, AJCC 7th Edition - Clinical: Stage IV (T1c, N1, M1a, PSA: Less than 10, Gleason 8-10) - Unsigned   INTERVAL HISTORY:  [name], a 71 y.o. 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 [place] was last seen on 05/09/2022.   Today he reports feeling fair. He stopped taking Erleada as he reports he was unable to tolerate it. He reports fatigue following  radiation treatment.    REVIEW OF SYSTEMS:  Review of Systems  Constitutional:  Positive for appetite change and fatigue.  Respiratory:  Positive for cough (COPD).   Gastrointestinal:  Positive for abdominal pain (6/10) and diarrhea.  Musculoskeletal:  Positive for back pain (6/10).  Neurological:  Positive for dizziness.  Psychiatric/Behavioral:  Positive for depression. The patient is nervous/anxious.   All other systems reviewed and are negative.   PAST MEDICAL/SURGICAL HISTORY:  Past Medical History:  Diagnosis Date   Abdominal aortic aneurysm (AAA) (HCC)    Anxiety    COPD (chronic obstructive pulmonary disease) (HCC)    Coronary artery calcification seen on CT scan    Essential hypertension    Lung cancer (HCC)    Non small cell carcinoma - XRT   Non-small cell carcinoma of left lung, stage 1 ([place]) 09/21/2017   Prostate cancer ([place])    XRT   Stroke ([place])    Type 2 diabetes mellitus ([place])    Past Surgical History:  Procedure Laterality Date   COLONOSCOPY N/A 11/23/2015   Procedure: COLONOSCOPY;  Surgeon: Aviva Signs, MD;  Location: [facility];  Service: Gastroenterology;  Laterality: N/A;   FRACTURE SURGERY     PROSTATE BIOPSY     SINUS SURGERY WITH INSTATRAK      SOCIAL HISTORY:  Social History   Socioeconomic History   Marital status: Married    Spouse name: Not on file   Number of children: Not on file   Years of education: Not on file   Highest education level: Not on file  Occupational History   Not on file  Tobacco Use   Smoking status: Every Day  Packs/day: 2.00    Years: 44.00    Total pack years: 88.00    Types: Cigarettes   Smokeless tobacco: Never  Vaping Use   Vaping Use: Never used  Substance and Sexual Activity   Alcohol use: No    Alcohol/week: 0.0 standard drinks of alcohol   Drug use: No   Sexual activity: Not on file  Other Topics Concern   Not on file  Social History Narrative   Not on file   Social Determinants of Health    Financial Resource Strain: Not on file  Food Insecurity: Not on file  Transportation Needs: Not on file  Physical Activity: Not on file  Stress: Not on file  Social Connections: Not on file  Intimate Partner Violence: Not on file    FAMILY HISTORY:  Family History  Problem Relation Age of Onset   Heart disease Mother    Skin cancer Mother    Heart disease Father        before age 48   Skin cancer Brother    Breast cancer Maternal Aunt    Cancer Neg Hx     CURRENT MEDICATIONS:  Current Outpatient Medications  Medication Sig Dispense Refill   alprazolam (XANAX) 2 MG tablet Take 2 mg by mouth 2 (two) times daily.     B Complex Vitamins (VITAMIN B COMPLEX PO) Take by mouth.     Blood Glucose Monitoring Suppl (ACCU-CHEK GUIDE ME) w/Device KIT 1 Piece by Does not apply route as directed. 1 kit 0   Cholecalciferol (VITAMIN D) 50 MCG (2000 UT) CAPS Take by mouth daily.     citalopram (CELEXA) 40 MG tablet Take 20-40 mg by mouth daily.     glipiZIDE (GLUCOTROL) 5 MG tablet TAKE 1 TABLET BY MOUTH TWICE DAILY BEFORE a meal 180 tablet 0   glucose blood (ACCU-CHEK GUIDE) test strip Use to test glucose 4 times a day. 200 each 2   rosuvastatin (CRESTOR) 10 MG tablet Take 1 tablet (10 mg total) by mouth daily. 90 tablet 3   tamsulosin (FLOMAX) 0.4 MG CAPS capsule Take 1 capsule (0.4 mg total) by mouth in the morning and at bedtime. 60 capsule 11   vitamin C (ASCORBIC ACID) 500 MG tablet Take 1,000 mg by mouth daily.     vitamin E 1000 UNIT capsule Take 180 Units by mouth daily.     zinc gluconate 50 MG tablet Take 50 mg by mouth daily.     No current facility-administered medications for this visit.    ALLERGIES:  Allergies  Allergen Reactions   Sulfa Antibiotics Hives, Itching and Swelling    Tongue swelling   Sulfasalazine Hives, Itching and Swelling    Tongue swelling   Lipitor [Atorvastatin] Other (See Comments)    myalgia   Simvastatin Other (See Comments)    myalgia     PHYSICAL EXAM:  Performance status (ECOG): 1 - Symptomatic but completely ambulatory  Vitals:   07/12/22 1522  BP: 109/71  Pulse: 81  Resp: 18  Temp: 97.8 F (36.6 C)   Wt Readings from Last 3 Encounters:  07/12/22 150 lb 3.2 oz (68.1 kg)  06/08/22 160 lb 12.8 oz (72.9 kg)  06/06/22 151 lb (68.5 kg)   Physical Exam Vitals reviewed.  Constitutional:      Appearance: Normal appearance.  Cardiovascular:     Rate and Rhythm: Normal rate and regular rhythm.     Pulses: Normal pulses.     Heart sounds: Normal heart sounds.  Pulmonary:     Effort: Pulmonary effort is normal.     Breath sounds: Normal breath sounds.  Neurological:     General: No focal deficit present.     Mental Status: He is alert and oriented to person, place, and time.  Psychiatric:        Mood and Affect: Mood normal.        Behavior: Behavior normal.      LABORATORY DATA:  I have reviewed the labs as listed.     Latest Ref Rng & Units 07/05/2022    1:14 PM 06/12/2022    1:10 PM 05/15/2019    6:14 PM  CBC  WBC 4.0 - 10.5 K/uL 7.2  6.9  10.5   Hemoglobin 13.0 - 17.0 g/dL 12.3  14.3  14.8   Hematocrit 39.0 - 52.0 % 37.6  42.5  46.4   Platelets 150 - 400 K/uL 167  133  263       Latest Ref Rng & Units 07/05/2022    1:14 PM 06/12/2022    1:10 PM 05/30/2022   11:08 AM  CMP  Glucose 70 - 99 mg/dL 175  140  148   BUN 8 - 23 mg/dL '18  15  16   ' Creatinine 0.61 - 1.24 mg/dL 1.17  1.04  1.23   Sodium 135 - 145 mmol/L 137  135  137   Potassium 3.5 - 5.1 mmol/L 4.9  4.5  4.9   Chloride 98 - 111 mmol/L 103  103  100   CO2 22 - 32 mmol/L '27  25  22   ' Calcium 8.9 - 10.3 mg/dL 9.4  9.6  9.8   Total Protein 6.5 - 8.1 g/dL 7.5  7.4  7.1   Total Bilirubin 0.3 - 1.2 mg/dL 0.4  0.4  0.2   Alkaline Phos 38 - 126 U/L 74  72  110   AST 15 - 41 U/L '18  12  15   ' ALT 0 - 44 U/L '21  11  16     ' DIAGNOSTIC IMAGING:  I have independently reviewed the scans and discussed with the patient. No results found.    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 - Prostate IMRT from 12/13/2016 through 02/09/2017, 78 Gray in 40 fractions with ADT. - Lupron started back on 09/01/2020 for rising PSA levels. - PSMA PET scan (03/30/2022): Nodes in the retroperitoneum, right pelvis, left lower jugular/supraclavicular stations.  No bone metastasis.  No local recurrence. - Enzalutamide started on 04/03/2022, discontinued due to extreme fatigue, vision changes and jaw pain. - Apalutamide started on 05/01/2022, taking only 2 pills but experiences jaw pain and neck pain and vision changes.  Discontinued on 05/09/2022 due to poor tolerance.    Social/family history: - Lives at home with his wife.  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 - Current active smoker, 2 packs/day for 55 years.  Rolls his own cigarettes. - Mother had skin cancers.  3.  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 - Definitive SBRT from 10/05/2017 through 10/12/2017, 54 Gray in 3 fractions of [place].   PLAN:  Metastatic CRPC to the lymph nodes: - He had radiation therapy from 05/30/2022 through 06/30/2022 by Dr. Tammi Klippel. - Germline mutation testing was negative. - PSA on 07/05/2022 was 1.37.  This is stable in the last 2 months. - He  could not tolerate apalutamide and enzalutamide. - Recommend starting Abiraterone with prednisone. - Discussed side effects including hypokalemia, hypertension, GI side effects. - We will send prescription to the specialty pharmacy. - RTC 2 weeks after starting Abiraterone and prednisone.  2.  Bone health: - DEXA scan (05/17/2022): T score -2.3.  Osteopenia. - We will consider checking vitamin D level in the future. - We will also talk about starting bisphosphonates.   Orders placed this encounter:  No orders of the defined types were placed in this  encounter.    Derek Jack, MD [place] [phone]   I, Thana Ates, am acting as a scribe for Dr. Derek Jack.  I, Derek Jack MD, have reviewed the above documentation for accuracy and completeness, and I agree with the above.

## 2022-07-12 NOTE — Telephone Encounter (Signed)
Oral Oncology Pharmacist Encounter  Received new prescription for Zytiga (abiraterone) for the treatment of metastatic castration resistant prostate cancer in conjunction with prednisone and ADT, planned duration until disease progression or unacceptable drug toxicity.  CMP from 07/05/22 assessed, no relevant lab abnormalities. Prescription dose and frequency assessed.   Current medication list in Epic reviewed, a few DDIs with abiraterone identified: Rosuvastatin: Abiraterone Acetate may enhance the myopathic (rhabdomyolysis) effect of HMG-CoA Reductase Inhibitors (Statins). Monitor for evidence of muscle toxicities (eg, myopathy, rhabdomyolysis) when abiraterone and HMG-COA reductase inhibitors (statins) are combined, No baseline dose adjustment needed. Tamsulosin: Abiraterone may increase the serum concentration of Tamsulosin. Monitor for increased tamsulosin effects (eg, hypotension, orthostasis). No baseline dose adjustment needed.   Evaluated chart and no patient barriers to medication adherence identified.   Prescription has been e-scribed to the [facility] for benefits analysis and approval.  Oral Oncology Clinic will continue to follow for insurance authorization, copayment issues, initial counseling and start date.   Darl Pikes, PharmD, BCPS, BCOP, CPP Hematology/Oncology Clinical Pharmacist Practitioner [place]/DB/AP Oral [facility] [facility] [phone]  07/12/2022 3:58 PM

## 2022-07-12 NOTE — Patient Instructions (Addendum)
[place] at [facility] Discharge Instructions  You were seen and examined today by Dr. Delton Coombes.  Dr. Delton Coombes discussed your most recent lab work and everything looks okay. Dr. Delton Coombes sent in [place] for you to take along with steroid. Dr. Delton Coombes wants you to start taking as soon as you get them.  Please call us and let us know when you receive the medication.  Follow-up as scheduled in 4 weeks.    Thank you for choosing [place] at [facility] to provide your oncology and hematology care.  To afford each patient quality time with our provider, please arrive at least 15 minutes before your scheduled appointment time.   If you have a lab appointment with the [facility] please come in thru the Main Entrance and check in at the main information desk.  You need to re-schedule your appointment should you arrive 10 or more minutes late.  We strive to give you quality time with our providers, and arriving late affects you and other patients whose appointments are after yours.  Also, if you no show three or more times for appointments you may be dismissed from the clinic at the providers discretion.     Again, thank you for choosing [facility].  Our hope is that these requests will decrease the amount of time that you wait before being seen by our physicians.       _____________________________________________________________  Should you have questions after your visit to [facility], please contact our office at [phone] and follow the prompts.  Our office hours are 8:00 a.m. and 4:30 p.m. Monday - Friday.  Please note that voicemails left after 4:00 p.m. may not be returned until the following business day.  We are closed weekends and major holidays.  You do have access to a nurse 24-7, just call the main number to the clinic [phone] and do not press any options, hold on the line and a nurse will  answer the phone.    For prescription refill requests, have your pharmacy contact our office and allow 72 hours.

## 2022-07-12 NOTE — Telephone Encounter (Signed)
Oral Oncology Patient Advocate Encounter  Prior Authorization for Abiraterone Fabio Asa) has been approved.    PA# [id number] Effective dates: 07/12/2022 through 07/12/2023  Patients co-pay is $420.90.    Lady Deutscher, CPhT-Adv Pharmacy Patient Advocate Specialist [place] Patient Advocate Team Direct Number: [phone]  Fax: [phone]

## 2022-07-12 NOTE — Telephone Encounter (Signed)
Oral Oncology Patient Advocate Encounter   Received notification that prior authorization for Abiraterone [facility])  is required.   PA submitted on 07/12/2022 [place] [id number] Status is pending     Lady Deutscher, CPhT-Adv Pharmacy Patient Advocate Specialist [place] Patient Advocate Team Direct Number: [phone]  Fax: [phone]

## 2022-07-13 ENCOUNTER — Other Ambulatory Visit (HOSPITAL_COMMUNITY): Payer: Self-pay

## 2022-07-13 NOTE — Telephone Encounter (Signed)
Patient will fill at [facility] using discounted drug pricing, $140/month. He was placed on our wait list for grant funding, to hopefully cover his medication in the future.

## 2022-07-13 NOTE — Telephone Encounter (Signed)
Oral Chemotherapy Pharmacist Encounter  [place] will deliver medication to Mr. Barua on 07/14/22. He will get started once he has medication in hand. He knows he also needs to pick up his prednisone from his local pharmacy.  Patient Education I spoke with patient for overview of new oral chemotherapy medication: Zytiga (abiraterone) for the treatment of metastatic castration resistant prostate cancer in conjunction with prednisone and ADT, planned duration until disease progression or unacceptable drug toxicity.   Pt is doing well. Counseled patient on administration, dosing, side effects, monitoring, drug-food interactions, safe handling, storage, and disposal. Patient will take: Abiraterone: Take 4 tablets (1,000 mg total) by mouth daily. Take on an empty stomach 1 hour before or 2 hours after a meal Prednisone: Take 1 tablet (5 mg total) by mouth daily with breakfast  Side effects include but not limited to: hypertension, fatigue, edema. Hypertension: Patient reports having a blood pressure cuff at home to monitor his BP. Reviewed with him s/sx of hypertension    Reviewed with patient importance of keeping a medication schedule and plan for any missed doses.  After discussion with patient no patient barriers to medication adherence identified.   Mr. Hostetler voiced understanding and appreciation. All questions answered. Medication handout provided.  Provided patient with Oral [place] [facility] phone number. Patient knows to call the office with questions or concerns. Oral Chemotherapy Navigation Clinic will continue to follow.  Darl Pikes, PharmD, BCPS, BCOP, CPP Hematology/Oncology Clinical Pharmacist Practitioner [place]/DB/AP Oral [place] [facility] [phone]  07/13/2022 1:39 PM

## 2022-07-18 ENCOUNTER — Other Ambulatory Visit (HOSPITAL_COMMUNITY): Payer: Self-pay

## 2022-07-20 ENCOUNTER — Other Ambulatory Visit (HOSPITAL_COMMUNITY): Payer: Self-pay

## 2022-07-20 DIAGNOSIS — E1159 Type 2 diabetes mellitus with other circulatory complications: Secondary | ICD-10-CM | POA: Diagnosis not present

## 2022-07-20 DIAGNOSIS — C61 Malignant neoplasm of prostate: Secondary | ICD-10-CM | POA: Diagnosis not present

## 2022-07-20 DIAGNOSIS — F419 Anxiety disorder, unspecified: Secondary | ICD-10-CM | POA: Diagnosis not present

## 2022-07-24 ENCOUNTER — Other Ambulatory Visit (HOSPITAL_COMMUNITY): Payer: Self-pay

## 2022-08-01 ENCOUNTER — Other Ambulatory Visit (HOSPITAL_COMMUNITY): Payer: Self-pay

## 2022-08-02 ENCOUNTER — Inpatient Hospital Stay (HOSPITAL_BASED_OUTPATIENT_CLINIC_OR_DEPARTMENT_OTHER): Payer: Medicare Other | Admitting: Hematology

## 2022-08-02 ENCOUNTER — Inpatient Hospital Stay: Payer: Medicare Other | Attending: Genetics

## 2022-08-02 VITALS — BP 103/71 | HR 71 | Temp 98.5°F | Resp 18 | Ht 70.0 in | Wt 155.9 lb

## 2022-08-02 DIAGNOSIS — M8580 Other specified disorders of bone density and structure, unspecified site: Secondary | ICD-10-CM | POA: Insufficient documentation

## 2022-08-02 DIAGNOSIS — Z85118 Personal history of other malignant neoplasm of bronchus and lung: Secondary | ICD-10-CM | POA: Insufficient documentation

## 2022-08-02 DIAGNOSIS — F17210 Nicotine dependence, cigarettes, uncomplicated: Secondary | ICD-10-CM | POA: Insufficient documentation

## 2022-08-02 DIAGNOSIS — Z803 Family history of malignant neoplasm of breast: Secondary | ICD-10-CM | POA: Insufficient documentation

## 2022-08-02 DIAGNOSIS — C61 Malignant neoplasm of prostate: Secondary | ICD-10-CM | POA: Insufficient documentation

## 2022-08-02 DIAGNOSIS — C775 Secondary and unspecified malignant neoplasm of intrapelvic lymph nodes: Secondary | ICD-10-CM | POA: Insufficient documentation

## 2022-08-02 DIAGNOSIS — R232 Flushing: Secondary | ICD-10-CM | POA: Diagnosis not present

## 2022-08-02 DIAGNOSIS — R21 Rash and other nonspecific skin eruption: Secondary | ICD-10-CM | POA: Diagnosis not present

## 2022-08-02 DIAGNOSIS — D509 Iron deficiency anemia, unspecified: Secondary | ICD-10-CM | POA: Diagnosis not present

## 2022-08-02 DIAGNOSIS — Z808 Family history of malignant neoplasm of other organs or systems: Secondary | ICD-10-CM | POA: Diagnosis not present

## 2022-08-02 DIAGNOSIS — Z7952 Long term (current) use of systemic steroids: Secondary | ICD-10-CM | POA: Insufficient documentation

## 2022-08-02 LAB — CBC WITH DIFFERENTIAL/PLATELET
Abs Immature Granulocytes: 0.05 10*3/uL (ref 0.00–0.07)
Basophils Absolute: 0 10*3/uL (ref 0.0–0.1)
Basophils Relative: 0 %
Eosinophils Absolute: 0.1 10*3/uL (ref 0.0–0.5)
Eosinophils Relative: 2 %
HCT: 36.7 % — ABNORMAL LOW (ref 39.0–52.0)
Hemoglobin: 12 g/dL — ABNORMAL LOW (ref 13.0–17.0)
Immature Granulocytes: 1 %
Lymphocytes Relative: 7 %
Lymphs Abs: 0.5 10*3/uL — ABNORMAL LOW (ref 0.7–4.0)
MCH: 31.9 pg (ref 26.0–34.0)
MCHC: 32.7 g/dL (ref 30.0–36.0)
MCV: 97.6 fL (ref 80.0–100.0)
Monocytes Absolute: 0.6 10*3/uL (ref 0.1–1.0)
Monocytes Relative: 8 %
Neutro Abs: 6.3 10*3/uL (ref 1.7–7.7)
Neutrophils Relative %: 82 %
Platelets: 155 10*3/uL (ref 150–400)
RBC: 3.76 MIL/uL — ABNORMAL LOW (ref 4.22–5.81)
RDW: 15.8 % — ABNORMAL HIGH (ref 11.5–15.5)
WBC: 7.6 10*3/uL (ref 4.0–10.5)
nRBC: 0 % (ref 0.0–0.2)

## 2022-08-02 LAB — COMPREHENSIVE METABOLIC PANEL
ALT: 14 U/L (ref 0–44)
AST: 16 U/L (ref 15–41)
Albumin: 4 g/dL (ref 3.5–5.0)
Alkaline Phosphatase: 79 U/L (ref 38–126)
Anion gap: 8 (ref 5–15)
BUN: 18 mg/dL (ref 8–23)
CO2: 24 mmol/L (ref 22–32)
Calcium: 9.1 mg/dL (ref 8.9–10.3)
Chloride: 104 mmol/L (ref 98–111)
Creatinine, Ser: 1.21 mg/dL (ref 0.61–1.24)
GFR, Estimated: >60 mL/min (ref >60)
Glucose, Bld: 183 mg/dL — ABNORMAL HIGH (ref 70–99)
Potassium: 4.1 mmol/L (ref 3.5–5.1)
Sodium: 136 mmol/L (ref 135–145)
Total Bilirubin: 0.7 mg/dL (ref 0.3–1.2)
Total Protein: 7.2 g/dL (ref 6.5–8.1)

## 2022-08-02 LAB — MAGNESIUM: Magnesium: 2.2 mg/dL (ref 1.7–2.4)

## 2022-08-02 NOTE — Progress Notes (Signed)
[address] [place], [place] [address]   CLINIC:  Medical Oncology/Hematology  PCP:  [name], PA-C [address] / [place] [address] [phone]   REASON FOR VISIT:  Follow-up for castration refractory prostate cancer to the lymph nodes  NGS Results: not done  CURRENT THERAPY: Lupron every 6 months  BRIEF ONCOLOGIC HISTORY:  Oncology History  Prostate cancer metastatic to intrapelvic lymph node ([place])  09/29/2016 Initial Diagnosis   Prostate cancer metastatic to intrapelvic lymph node ([place])    Genetic Testing   Negative genetic testing. No pathogenic variants identified on the Common Hereditary Cancers+RNA panel. The report date is 06/23/2022.  The Common Hereditary Cancers Panel + RNA offered by [facility] includes sequencing and/or deletion duplication testing of the following 47 genes: APC, ATM, AXIN2, BARD1, BMPR1A, BRCA1, BRCA2, BRIP1, CDH1, CDKN2A (p14ARF), CDKN2A (p16INK4a), CKD4, CHEK2, CTNNA1, DICER1, EPCAM (Deletion/duplication testing only), GREM1 (promoter region deletion/duplication testing only), KIT, MEN1, MLH1, MSH2, MSH3, MSH6, MUTYH, NBN, NF1, NHTL1, PALB2, PDGFRA, PMS2, POLD1, POLE, PTEN, RAD50, RAD51C, RAD51D, SDHB, SDHC, SDHD, SMAD4, SMARCA4. STK11, TP53, TSC1, TSC2, and VHL.  The following genes were evaluated for sequence changes only: SDHA and HOXB13 c.251G>A variant only.     CANCER STAGING:  Cancer Staging  Prostate cancer metastatic to intrapelvic lymph node ([place]) Staging form: Prostate, AJCC 7th Edition - Clinical: Stage IV (T1c, N1, M1a, PSA: Less than 10, Gleason 8-10) - Unsigned   INTERVAL HISTORY:  [name], a 71 y.o. male, seen for follow-up of CRPC to the lymph nodes.  He reportedly started taking Zytiga 1000 mg on 07/14/2022 along with prednisone.  He denies any GI symptoms.  He reported occasional hot flashes since the start of Zytiga.  Also noticed transient rash on the right  forearm and left dorsum of the hand.  Nonitching rash.  REVIEW OF SYSTEMS:  Review of Systems  Constitutional:  Negative for appetite change and fatigue.  Respiratory:  Negative for cough.   Gastrointestinal:  Negative for diarrhea.  Musculoskeletal:  Negative for back pain.  Neurological:  Positive for dizziness.  Psychiatric/Behavioral:  Negative for depression. The patient is not nervous/anxious.   All other systems reviewed and are negative.   PAST MEDICAL/SURGICAL HISTORY:  Past Medical History:  Diagnosis Date   Abdominal aortic aneurysm (AAA) (HCC)    Anxiety    COPD (chronic obstructive pulmonary disease) (HCC)    Coronary artery calcification seen on CT scan    Essential hypertension    Lung cancer (HCC)    Non small cell carcinoma - XRT   Non-small cell carcinoma of left lung, stage 1 ([place]) 09/21/2017   Prostate cancer ([place])    XRT   Stroke ([place])    Type 2 diabetes mellitus ([place])    Past Surgical History:  Procedure Laterality Date   COLONOSCOPY N/A 11/23/2015   Procedure: COLONOSCOPY;  Surgeon: Aviva Signs, MD;  Location: [facility];  Service: Gastroenterology;  Laterality: N/A;   FRACTURE SURGERY     PROSTATE BIOPSY     SINUS SURGERY WITH INSTATRAK      SOCIAL HISTORY:  Social History   Socioeconomic History   Marital status: Married    Spouse name: Not on file   Number of children: Not on file   Years of education: Not on file   Highest education level: Not on file  Occupational History   Not on file  Tobacco Use   Smoking status: Every  Day    Packs/day: 2.00    Years: 44.00    Total pack years: 88.00    Types: Cigarettes   Smokeless tobacco: Never  Vaping Use   Vaping Use: Never used  Substance and Sexual Activity   Alcohol use: No    Alcohol/week: 0.0 standard drinks of alcohol   Drug use: No   Sexual activity: Not on file  Other Topics Concern   Not on file  Social History Narrative   Not on file   Social Determinants of Health    Financial Resource Strain: Not on file  Food Insecurity: Not on file  Transportation Needs: Not on file  Physical Activity: Not on file  Stress: Not on file  Social Connections: Not on file  Intimate Partner Violence: Not on file    FAMILY HISTORY:  Family History  Problem Relation Age of Onset   Heart disease Mother    Skin cancer Mother    Heart disease Father        before age 12   Skin cancer Brother    Breast cancer Maternal Aunt    Cancer Neg Hx     CURRENT MEDICATIONS:  Current Outpatient Medications  Medication Sig Dispense Refill   abiraterone acetate (ZYTIGA) 250 MG tablet Take 4 tablets (1,000 mg total) by mouth daily. Take on an empty stomach 1 hour before or 2 hours after a meal 120 tablet 2   alprazolam (XANAX) 2 MG tablet Take 2 mg by mouth 2 (two) times daily.     Blood Glucose Monitoring Suppl (ACCU-CHEK GUIDE ME) w/Device KIT 1 Piece by Does not apply route as directed. 1 kit 0   citalopram (CELEXA) 40 MG tablet Take 20-40 mg by mouth daily.     glipiZIDE (GLUCOTROL) 5 MG tablet TAKE 1 TABLET BY MOUTH TWICE DAILY BEFORE a meal 180 tablet 0   glucose blood (ACCU-CHEK GUIDE) test strip Use to test glucose 4 times a day. 200 each 2   multivitamin (ONE-A-DAY MEN'S) TABS tablet Take 1 tablet by mouth daily.     predniSONE (DELTASONE) 5 MG tablet Take 1 tablet (5 mg total) by mouth daily with breakfast. 30 tablet 6   rosuvastatin (CRESTOR) 10 MG tablet Take 1 tablet (10 mg total) by mouth daily. 90 tablet 3   tamsulosin (FLOMAX) 0.4 MG CAPS capsule Take 1 capsule (0.4 mg total) by mouth in the morning and at bedtime. 60 capsule 11   No current facility-administered medications for this visit.    ALLERGIES:  Allergies  Allergen Reactions   Sulfa Antibiotics Hives, Itching and Swelling    Tongue swelling   Sulfasalazine Hives, Itching and Swelling    Tongue swelling   Lipitor [Atorvastatin] Other (See Comments)    myalgia   Simvastatin Other (See Comments)     myalgia    PHYSICAL EXAM:  Performance status (ECOG): 1 - Symptomatic but completely ambulatory  Vitals:   08/02/22 1252  BP: 103/71  Pulse: 71  Resp: 18  Temp: 98.5 F (36.9 C)  SpO2: 97%   Wt Readings from Last 3 Encounters:  08/02/22 155 lb 13.8 oz (70.7 kg)  07/12/22 150 lb 3.2 oz (68.1 kg)  06/08/22 160 lb 12.8 oz (72.9 kg)   Physical Exam Vitals reviewed.  Constitutional:      Appearance: Normal appearance.  Cardiovascular:     Rate and Rhythm: Normal rate and regular rhythm.     Pulses: Normal pulses.     Heart sounds: Normal  heart sounds.  Pulmonary:     Effort: Pulmonary effort is normal.     Breath sounds: Normal breath sounds.  Neurological:     General: No focal deficit present.     Mental Status: He is alert and oriented to person, place, and time.  Psychiatric:        Mood and Affect: Mood normal.        Behavior: Behavior normal.      LABORATORY DATA:  I have reviewed the labs as listed.     Latest Ref Rng & Units 08/02/2022   12:06 PM 07/05/2022    1:14 PM 06/12/2022    1:10 PM  CBC  WBC 4.0 - 10.5 K/uL 7.6  7.2  6.9   Hemoglobin 13.0 - 17.0 g/dL 12.0  12.3  14.3   Hematocrit 39.0 - 52.0 % 36.7  37.6  42.5   Platelets 150 - 400 K/uL 155  167  133       Latest Ref Rng & Units 08/02/2022   12:06 PM 07/05/2022    1:14 PM 06/12/2022    1:10 PM  CMP  Glucose 70 - 99 mg/dL 183  175  140   BUN 8 - 23 mg/dL '18  18  15   ' Creatinine 0.61 - 1.24 mg/dL 1.21  1.17  1.04   Sodium 135 - 145 mmol/L 136  137  135   Potassium 3.5 - 5.1 mmol/L 4.1  4.9  4.5   Chloride 98 - 111 mmol/L 104  103  103   CO2 22 - 32 mmol/L '24  27  25   ' Calcium 8.9 - 10.3 mg/dL 9.1  9.4  9.6   Total Protein 6.5 - 8.1 g/dL 7.2  7.5  7.4   Total Bilirubin 0.3 - 1.2 mg/dL 0.7  0.4  0.4   Alkaline Phos 38 - 126 U/L 79  74  72   AST 15 - 41 U/L '16  18  12   ' ALT 0 - 44 U/L '14  21  11     ' DIAGNOSTIC IMAGING:  I have independently reviewed the scans and discussed with the  patient. No results found.   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 - Prostate IMRT from 12/13/2016 through 02/09/2017, 78 Gray in 40 fractions with ADT. - Lupron started back on 09/01/2020 for rising PSA levels. - PSMA PET scan (03/30/2022): Nodes in the retroperitoneum, right pelvis, left lower jugular/supraclavicular stations.  No bone metastasis.  No local recurrence. - Enzalutamide started on 04/03/2022, discontinued due to extreme fatigue, vision changes and jaw pain. - Apalutamide started on 05/01/2022, taking only 2 pills but experiences jaw pain and neck pain and vision changes.  Discontinued on 05/09/2022 due to poor tolerance.       - XRT with Dr. Tammi Klippel from 05/30/2022 through 06/30/2022.       -Abiraterone 1000 mg and prednisone 5 mg started on 07/06/2022    Social/family history: - Lives at home with his wife.  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 - Current active smoker, 2 packs/day for 55 years.  Rolls his own cigarettes. - Mother had skin cancers.  3.  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 - Definitive SBRT from 10/05/2017 through 10/12/2017, 54 Gray in 3 fractions of [place].   PLAN:  Metastatic CRPC to the lymph nodes: - He  has started taking Zytiga 1000 mg and prednisone 5 mg daily on 07/14/2022.  So far he did not have any major side effects.  He had occasional hot flashes.  He also had transient rash on the right forearm and left hand which was not itching.  I did not see any rash today. - We reviewed labs from today which showed normal LFTs and creatinine.  CBC was grossly normal with mild normocytic anemia.  Last PSA was 1.37.  I have recommended continuing Zytiga at the same dose along with prednisone 5 mg daily.  I have recommended follow-up visit in 3 weeks with repeat labs.  We will plan to check  PSA in 2 months from the start of Zytiga.  2.  Bone health: - His DEXA scan on 05/17/2022 with T score -2.3 consistent with osteopenia.       -We talked about initiating him on denosumab.  He is not interested at this time.  I have recommended taking calcium and vitamin D supplements.   Orders placed this encounter:  Orders Placed This Encounter  Procedures   CBC with Differential   Comprehensive metabolic panel   Magnesium      Derek Jack, MD [place] [phone]

## 2022-08-02 NOTE — Progress Notes (Signed)
Patient is taking Zytiga as prescribed.  He has not missed any doses and reports no side effects at this time.

## 2022-08-02 NOTE — Patient Instructions (Signed)
[place] at [facility] Discharge Instructions   You were seen and examined today by Dr. Delton Coombes.  He reviewed the results of your lab work which are normal/stable.   Continue Zytiga as prescribed.   Return as scheduled.    Thank you for choosing [place] at [facility] to provide your oncology and hematology care.  To afford each patient quality time with our provider, please arrive at least 15 minutes before your scheduled appointment time.   If you have a lab appointment with the [facility] please come in thru the Main Entrance and check in at the main information desk.  You need to re-schedule your appointment should you arrive 10 or more minutes late.  We strive to give you quality time with our providers, and arriving late affects you and other patients whose appointments are after yours.  Also, if you no show three or more times for appointments you may be dismissed from the clinic at the providers discretion.     Again, thank you for choosing [facility].  Our hope is that these requests will decrease the amount of time that you wait before being seen by our physicians.       _____________________________________________________________  Should you have questions after your visit to [facility], please contact our office at [phone] and follow the prompts.  Our office hours are 8:00 a.m. and 4:30 p.m. Monday - Friday.  Please note that voicemails left after 4:00 p.m. may not be returned until the following business day.  We are closed weekends and major holidays.  You do have access to a nurse 24-7, just call the main number to the clinic [phone] and do not press any options, hold on the line and a nurse will answer the phone.    For prescription refill requests, have your pharmacy contact our office and allow 72 hours.    Due to Covid, you will need to wear a mask upon entering the hospital.  If you do not have a mask, a mask will be given to you at the Main Entrance upon arrival. For doctor visits, patients may have 1 support person age 27 or older with them. For treatment visits, patients can not have anyone with them due to social distancing guidelines and our immunocompromised population.

## 2022-08-04 DIAGNOSIS — Z1331 Encounter for screening for depression: Secondary | ICD-10-CM | POA: Diagnosis not present

## 2022-08-04 DIAGNOSIS — I10 Essential (primary) hypertension: Secondary | ICD-10-CM | POA: Diagnosis not present

## 2022-08-04 DIAGNOSIS — E1159 Type 2 diabetes mellitus with other circulatory complications: Secondary | ICD-10-CM | POA: Diagnosis not present

## 2022-08-04 DIAGNOSIS — C61 Malignant neoplasm of prostate: Secondary | ICD-10-CM | POA: Diagnosis not present

## 2022-08-04 DIAGNOSIS — Z6822 Body mass index (BMI) 22.0-22.9, adult: Secondary | ICD-10-CM | POA: Diagnosis not present

## 2022-08-04 DIAGNOSIS — J449 Chronic obstructive pulmonary disease, unspecified: Secondary | ICD-10-CM | POA: Diagnosis not present

## 2022-08-04 DIAGNOSIS — R945 Abnormal results of liver function studies: Secondary | ICD-10-CM | POA: Diagnosis not present

## 2022-08-04 DIAGNOSIS — F22 Delusional disorders: Secondary | ICD-10-CM | POA: Diagnosis not present

## 2022-08-04 DIAGNOSIS — E782 Mixed hyperlipidemia: Secondary | ICD-10-CM | POA: Diagnosis not present

## 2022-08-04 DIAGNOSIS — F3481 Disruptive mood dysregulation disorder: Secondary | ICD-10-CM | POA: Diagnosis not present

## 2022-08-04 DIAGNOSIS — Z0001 Encounter for general adult medical examination with abnormal findings: Secondary | ICD-10-CM | POA: Diagnosis not present

## 2022-08-07 NOTE — Progress Notes (Signed)
  Radiation Oncology         [phone]) [phone] ________________________________  Name: Stephen Palmer. MRN: [id number]  Date: 06/30/2022  DOB: 06/15/1951  End of Treatment Note  Diagnosis:   71 y.o. male with h/o prostate cancer with oligometastasis to abdominal, pelvic and supraclavicular lymph nodes.      Indication for treatment:  Palliative       Radiation treatment dates:   6/13-7/14/23  Site/dose:   Abd/Pelvic nodes were treated to 50 Gy in 10 fractions then Left supraclavivular nodes were treated to 40 Gy in 5 fractions  Beams/energy:   Each site treated with 6X VMAT using 3 arcs  Narrative: The patient tolerated radiation treatment relatively well.     Plan: The patient has completed radiation treatment. The patient will return to radiation oncology clinic for routine followup in one month. I advised him to call or return sooner if he has any questions or concerns related to his recovery or treatment. ________________________________  Sheral Apley. Tammi Klippel, M.D.

## 2022-08-09 ENCOUNTER — Other Ambulatory Visit: Payer: Self-pay

## 2022-08-09 MED ORDER — PREDNISONE 5 MG PO TABS: 5.0000 mg | ORAL_TABLET | Freq: Every day | ORAL | 6 refills | Status: DC

## 2022-08-10 ENCOUNTER — Other Ambulatory Visit: Payer: Medicare Other

## 2022-08-10 ENCOUNTER — Other Ambulatory Visit (HOSPITAL_COMMUNITY): Payer: Self-pay

## 2022-08-10 ENCOUNTER — Ambulatory Visit: Payer: Medicare Other | Admitting: Hematology

## 2022-08-22 ENCOUNTER — Encounter: Payer: Self-pay | Admitting: Urology

## 2022-08-22 NOTE — Progress Notes (Signed)
Telephone appointment. I verified patient's identity and began nursing interview. No [place] related issues reported at this time.  Meaningful use complete.  Reminded patient of his 10:30am-08/23/22 telephone appointment w/ Ashlyn Bruning PA-C. I left my extension [phone] in case patient need anything. Patient verbalized understanding.  Patient contact [phone]

## 2022-08-23 ENCOUNTER — Ambulatory Visit
Admission: RE | Admit: 2022-08-23 | Discharge: 2022-08-23 | Disposition: A | Discharge status: Home / Self Care | Payer: Medicare Other | Source: Ambulatory Visit | Attending: Radiation Oncology | Admitting: Radiation Oncology

## 2022-08-23 DIAGNOSIS — C61 Malignant neoplasm of prostate: Secondary | ICD-10-CM | POA: Insufficient documentation

## 2022-08-23 DIAGNOSIS — C771 Secondary and unspecified malignant neoplasm of intrathoracic lymph nodes: Secondary | ICD-10-CM

## 2022-08-23 DIAGNOSIS — C775 Secondary and unspecified malignant neoplasm of intrapelvic lymph nodes: Secondary | ICD-10-CM

## 2022-08-23 NOTE — Progress Notes (Signed)
Radiation Oncology         [phone]) [phone] ________________________________  Name: Stephen Palmer. MRN: [id number]  Date: 08/23/2022  DOB: 06-Aug-1951  Post Treatment Note  CC: [name], MD  Diagnosis:   71 y.o. male with h/o prostate cancer with oligometastasis to abdominal, pelvic and supraclavicular lymph nodes.      Interval Since Last Radiation:  2.5 months  05/30/22 - 06/12/22//UHRT pelvic nodes   06/21/22 - 06/30/22//SBRT left supraclavicular node   Curative, Definitive SBRT lung:  10/05/2017, 10/09/2017, 10/12/2017- The target in the LUL lung was treated to 54 Gy in 3 fractions of 18 Gy.   12/13/16 - 02/09/17: Prostate IMRT ; 40 fractions to 78 Gy in combination with LT-ADT under the care of Dr. Lianne Cure in [place], [place].  Narrative:  I spoke with the patient to conduct his routine scheduled 1 month follow up visit via telephone to spare the patient unnecessary potential exposure in the healthcare setting during the current COVID-19 pandemic.  The patient was notified in advance and gave permission to proceed with this visit format. He tolerated radiation treatment well and did not experience any ill side effects aside from fatigue.     I spoke with the patient to conduct his routine scheduled follow up visit via telephone to spare the patient unnecessary potential exposure in the healthcare setting during the current COVID-19 pandemic.  The patient was notified in advance and gave permission to proceed with this visit format.                              In summary, he was initially diagnosed with Stage I NSCLC, squamous cell carcinoma, after a 3 cm spiculated mass in the LUL was identified on CT scan in the ED following a fall at home. PET scan and MRI brain for disease staging were without evidence of metastatic disease. He elected to proceed with stereotactic body radiotherapy (SBRT) to the LUL lung lesion as he was not felt to be a surgical candidate due to  his severe emphysema and other medical comorbidities.  His treatment was completed in October 2018 and he tolerated this very well.  Just prior to this treatment, he had recently completed definitive prostate IMRT under the care and direction of Dr. Lianne Cure in [place], [place], for treatment of the high risk, Gleason 4+5 prostate adenocarcinoma with a pretreatment PSA of 8.1.  His posttreatment PSA was 0.21 and he has continued in close follow-up under the care and direction of Dr. Alinda Money.  We have continue to follow him closely with serial CT chest scans which have remained without any evidence of disease recurrence of the squamous cell lung cancer, most recent scan was 10/24/2021.   Unfortunately, he has had a rising PSA since 03/03/22 when his PSA increased from 0.3 in 08/2021 to 3.9 and remained elevated at 4.4 on repeat labs 03/10/22, despite ADT. This prompted further evaluation with a PSMA PET scan which was performed 03/30/22 and showed tracer avid nodes within the abdominal retroperitoneum, right pelvis, and left low jugular/supraclavicular stations, most consistent with nodal metastasis. The low left jugular/supraclavicular nodes were favored to be from prostate primary while nodal metastasis from the patient's lung primary was felt less likely. There was no evidence of local recurrence or osseous metastasis identified.   He was started on Xtandi, in addition to the Lupron ADT in 03/2022, under the care and  direction of Dr. Alyson Ingles. He has been experiencing significant fatigue/malaise since starting the [facility] but otherwise feels he tolerates this fairly well. In reviewing his PSMA PET to evaluate for any evidence of lung cancer recurrence, we noted the prostate cancer recurrence in the pelvic and retroperitoneal nodes and in reviewing the prior prostate IMRT treatment plans from [facility], it was noted that the pelvic nodes were not included in the original treatment. Therefore, we discussed  consideration for a 10-fraction course of UHRT to the pelvic and retroperitoneal nodes to boost local control and help with PSA response.  At the same time, we would treat the left supraclavicular node with a 5 fraction course of SBRT.  This was discussed with the patient at the time of follow-up on 04/26/2022 and the patient was in agreement to proceed.  His most recent course of radiation to the abdominal/pelvic lymph nodes and left supraclavicular lymph node was completed in July 2023 and he tolerated the treatments very well.  On review of systems, the patient states that he is doing well in general.  He specifically denies any dysphagia, chest pain, productive cough, hemoptysis, dyspnea, increased shortness of breath, fever, chills or night sweats.  He denies LUTS, abdominal pain, nausea, vomiting, diarrhea or constipation.  He has recently established care with Dr. Delton Coombes and was switched to Zytiga/prednisone on 07/06/2022 since he has not previously tolerated Xtandi or Erleada more recently.  He feels like he is tolerating the Zytiga much better although he does continue with some fatigue.  He has a scheduled follow-up visit with Dr. Delton Coombes on 08/24/2022 and is scheduled for repeat labs with Dr. Alyson Ingles on 09/05/2022.   ALLERGIES:  is allergic to sulfa antibiotics, sulfasalazine, lipitor [atorvastatin], and simvastatin.  Meds: Current Outpatient Medications  Medication Sig Dispense Refill   abiraterone acetate (ZYTIGA) 250 MG tablet Take 4 tablets (1,000 mg total) by mouth daily. Take on an empty stomach 1 hour before or 2 hours after a meal 120 tablet 2   alprazolam (XANAX) 2 MG tablet Take 2 mg by mouth 2 (two) times daily.     Blood Glucose Monitoring Suppl (ACCU-CHEK GUIDE ME) w/Device KIT 1 Piece by Does not apply route as directed. 1 kit 0   citalopram (CELEXA) 40 MG tablet Take 20-40 mg by mouth daily.     glipiZIDE (GLUCOTROL) 5 MG tablet TAKE 1 TABLET BY MOUTH TWICE DAILY BEFORE a  meal 180 tablet 0   glucose blood (ACCU-CHEK GUIDE) test strip Use to test glucose 4 times a day. 200 each 2   multivitamin (ONE-A-DAY MEN'S) TABS tablet Take 1 tablet by mouth daily.     predniSONE (DELTASONE) 5 MG tablet Take 1 tablet (5 mg total) by mouth daily with breakfast. 30 tablet 6   rosuvastatin (CRESTOR) 10 MG tablet Take 1 tablet (10 mg total) by mouth daily. 90 tablet 3   tamsulosin (FLOMAX) 0.4 MG CAPS capsule Take 1 capsule (0.4 mg total) by mouth in the morning and at bedtime. 60 capsule 11   No current facility-administered medications for this encounter.    Physical Findings:  vitals were not taken for this visit.  Pain Assessment Pain Score: 0-No pain/10 To assess due to telephone follow-up visit format.  Lab Findings: Lab Results  Component Value Date   WBC 7.6 08/02/2022   HGB 12.0 (L) 08/02/2022   HCT 36.7 (L) 08/02/2022   MCV 97.6 08/02/2022   PLT 155 08/02/2022     Radiographic Findings: No results found.  Impression/Plan: 15. 71 y.o. male with h/o prostate cancer with oligometastasis to abdominal, pelvic and supraclavicular lymph nodes.     He appears to have recovered well from the effects of his recent radiation treatments and is currently without complaints.  We discussed that while we are happy to continue to participate in his care if clinically indicated, at this point, with regards to the prostate cancer we will plan to see him back on an as-needed basis.  He will continue in routine follow-up under the care and direction of Dr. Delton Coombes and Dr. Alyson Ingles but knows that he is welcome to call anytime with any questions or concerns related to his previous radiation.  2. Stage I, NSCLC, squamous cell carcinoma of the left upper lobe lung.  We will plan to continue to monitor this closely with serial CT chest scans annually and I will plan to call him following each scan to review the results and any recommendations.  He has recently established care  with Dr. Delton Coombes at [facility] so it is possible that Dr. Raliegh Ip will take over the follow-up of both the metastatic prostate cancer and the lung cancer. We will plan to stay in close communication with his team and are happy to continue to monitor the lung cancer but want to make every effort to avoid any duplication of efforts.  At this time, our plan will be to proceed with repeat CT chest in May 2024 and a follow-up telephone visit thereafter unless I hear otherwise from Dr. Delton Coombes.  He knows that he is welcome to call at anytime in the interim with any questions or concerns related to his previous radiation.    [name], PA-C

## 2022-08-24 ENCOUNTER — Inpatient Hospital Stay: Payer: Medicare Other

## 2022-08-24 ENCOUNTER — Inpatient Hospital Stay: Payer: Medicare Other | Attending: Genetics

## 2022-08-24 ENCOUNTER — Inpatient Hospital Stay (HOSPITAL_BASED_OUTPATIENT_CLINIC_OR_DEPARTMENT_OTHER): Payer: Medicare Other | Admitting: Hematology

## 2022-08-24 ENCOUNTER — Inpatient Hospital Stay: Payer: Medicare Other | Admitting: Hematology

## 2022-08-24 VITALS — BP 109/72 | HR 80 | Temp 97.7°F | Resp 16 | Ht 70.0 in | Wt 157.1 lb

## 2022-08-24 DIAGNOSIS — C775 Secondary and unspecified malignant neoplasm of intrapelvic lymph nodes: Secondary | ICD-10-CM | POA: Insufficient documentation

## 2022-08-24 DIAGNOSIS — M8580 Other specified disorders of bone density and structure, unspecified site: Secondary | ICD-10-CM | POA: Insufficient documentation

## 2022-08-24 DIAGNOSIS — C3412 Malignant neoplasm of upper lobe, left bronchus or lung: Secondary | ICD-10-CM | POA: Diagnosis not present

## 2022-08-24 DIAGNOSIS — Z79899 Other long term (current) drug therapy: Secondary | ICD-10-CM | POA: Insufficient documentation

## 2022-08-24 DIAGNOSIS — F17210 Nicotine dependence, cigarettes, uncomplicated: Secondary | ICD-10-CM | POA: Insufficient documentation

## 2022-08-24 DIAGNOSIS — C61 Malignant neoplasm of prostate: Secondary | ICD-10-CM | POA: Diagnosis not present

## 2022-08-24 DIAGNOSIS — C771 Secondary and unspecified malignant neoplasm of intrathoracic lymph nodes: Secondary | ICD-10-CM

## 2022-08-24 DIAGNOSIS — D649 Anemia, unspecified: Secondary | ICD-10-CM | POA: Diagnosis not present

## 2022-08-24 DIAGNOSIS — J449 Chronic obstructive pulmonary disease, unspecified: Secondary | ICD-10-CM | POA: Insufficient documentation

## 2022-08-24 LAB — CBC WITH DIFFERENTIAL/PLATELET
Abs Immature Granulocytes: 0.05 10*3/uL (ref 0.00–0.07)
Basophils Absolute: 0 10*3/uL (ref 0.0–0.1)
Basophils Relative: 0 %
Eosinophils Absolute: 0.1 10*3/uL (ref 0.0–0.5)
Eosinophils Relative: 2 %
HCT: 37.3 % — ABNORMAL LOW (ref 39.0–52.0)
Hemoglobin: 12.4 g/dL — ABNORMAL LOW (ref 13.0–17.0)
Immature Granulocytes: 1 %
Lymphocytes Relative: 7 %
Lymphs Abs: 0.6 10*3/uL — ABNORMAL LOW (ref 0.7–4.0)
MCH: 32.6 pg (ref 26.0–34.0)
MCHC: 33.2 g/dL (ref 30.0–36.0)
MCV: 98.2 fL (ref 80.0–100.0)
Monocytes Absolute: 0.6 10*3/uL (ref 0.1–1.0)
Monocytes Relative: 7 %
Neutro Abs: 6.7 10*3/uL (ref 1.7–7.7)
Neutrophils Relative %: 83 %
Platelets: 153 10*3/uL (ref 150–400)
RBC: 3.8 MIL/uL — ABNORMAL LOW (ref 4.22–5.81)
RDW: 15.2 % (ref 11.5–15.5)
WBC: 8 10*3/uL (ref 4.0–10.5)
nRBC: 0 % (ref 0.0–0.2)

## 2022-08-24 LAB — COMPREHENSIVE METABOLIC PANEL
ALT: 16 U/L (ref 0–44)
AST: 17 U/L (ref 15–41)
Albumin: 4.1 g/dL (ref 3.5–5.0)
Alkaline Phosphatase: 87 U/L (ref 38–126)
Anion gap: 8 (ref 5–15)
BUN: 21 mg/dL (ref 8–23)
CO2: 27 mmol/L (ref 22–32)
Calcium: 9.2 mg/dL (ref 8.9–10.3)
Chloride: 102 mmol/L (ref 98–111)
Creatinine, Ser: 1.06 mg/dL (ref 0.61–1.24)
GFR, Estimated: >60 mL/min (ref >60)
Glucose, Bld: 150 mg/dL — ABNORMAL HIGH (ref 70–99)
Potassium: 4.4 mmol/L (ref 3.5–5.1)
Sodium: 137 mmol/L (ref 135–145)
Total Bilirubin: 0.7 mg/dL (ref 0.3–1.2)
Total Protein: 7.6 g/dL (ref 6.5–8.1)

## 2022-08-24 LAB — MAGNESIUM: Magnesium: 2.4 mg/dL (ref 1.7–2.4)

## 2022-08-24 NOTE — Patient Instructions (Signed)
[facility] at [facility] Discharge Instructions   You were seen and examined today by Dr. Delton Coombes.  He reviewed the results of your lab work which are normal/stable.   Continue Zytiga as prescribed.   We will see you back in 5 weeks. We will repeat blood work prior.    Thank you for choosing [facility] at [facility] to provide your oncology and hematology care.  To afford each patient quality time with our provider, please arrive at least 15 minutes before your scheduled appointment time.   If you have a lab appointment with the [facility] please come in thru the Main Entrance and check in at the main information desk.  You need to re-schedule your appointment should you arrive 10 or more minutes late.  We strive to give you quality time with our providers, and arriving late affects you and other patients whose appointments are after yours.  Also, if you no show three or more times for appointments you may be dismissed from the clinic at the providers discretion.     Again, thank you for choosing [facility].  Our hope is that these requests will decrease the amount of time that you wait before being seen by our physicians.       _____________________________________________________________  Should you have questions after your visit to [facility], please contact our office at [phone] and follow the prompts.  Our office hours are 8:00 a.m. and 4:30 p.m. Monday - Friday.  Please note that voicemails left after 4:00 p.m. may not be returned until the following business day.  We are closed weekends and major holidays.  You do have access to a nurse 24-7, just call the main number to the clinic [phone] and do not press any options, hold on the line and a nurse will answer the phone.    For prescription refill requests, have your pharmacy contact our office and allow 72 hours.    Due to Covid, you will  need to wear a mask upon entering the hospital. If you do not have a mask, a mask will be given to you at the Main Entrance upon arrival. For doctor visits, patients may have 1 support person age 28 or older with them. For treatment visits, patients can not have anyone with them due to social distancing guidelines and our immunocompromised population.

## 2022-08-25 NOTE — Progress Notes (Signed)
[name], [place] [address]   CLINIC:  Medical Oncology/Hematology  PCP:  Stephen Samples, PA-C 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 No pathogenic variants identified on the Common Hereditary Cancers+RNA panel. The report date is 06/23/2022.  The Common Hereditary Cancers Panel + RNA offered by [facility] includes sequencing and/or deletion duplication testing of the following 47 genes: APC, ATM, AXIN2, BARD1, BMPR1A, BRCA1, BRCA2, BRIP1, CDH1, CDKN2A (p14ARF), CDKN2A (p16INK4a), CKD4, CHEK2, CTNNA1, DICER1, EPCAM (Deletion/duplication testing only), GREM1 (promoter region deletion/duplication testing only), KIT, MEN1, MLH1, MSH2, MSH3, MSH6, MUTYH, NBN, NF1, NHTL1, PALB2, PDGFRA, PMS2, POLD1, POLE, PTEN, RAD50, RAD51C, RAD51D, SDHB, SDHC, SDHD, SMAD4, SMARCA4. STK11, TP53, TSC1, TSC2, and VHL.  The following genes were evaluated for sequence changes only: SDHA and HOXB13 c.251G>A variant only.     CANCER STAGING:  Cancer Staging  Prostate cancer metastatic to intrapelvic lymph node ([place]) Staging form: Prostate, AJCC 7th Edition - Clinical: Stage IV (T1c, N1, M1a, PSA: Less than 10, Gleason 8-10) - Unsigned   INTERVAL HISTORY:  [name], a 71 y.o. male, seen for follow-up of castrate resistant prostate cancer.  He is not missing doses of Abiraterone and prednisone.  Has chronic cough from COPD which is stable.  Has occasional headaches which are also stable.  REVIEW OF SYSTEMS:  Review of Systems  Respiratory:   Positive for cough (From COPD).   Neurological:  Positive for dizziness.  Psychiatric/Behavioral:  Negative for depression. The patient is not nervous/anxious.   All other systems reviewed and are negative.   PAST MEDICAL/SURGICAL HISTORY:  Past Medical History:  Diagnosis Date   Abdominal aortic aneurysm (AAA) (HCC)    Anxiety    COPD (chronic obstructive pulmonary disease) (HCC)    Coronary artery calcification seen on CT scan    Essential hypertension    Lung cancer (HCC)    Non small cell carcinoma - XRT   Non-small cell carcinoma of left lung, stage 1 ([place]) 09/21/2017   Prostate cancer ([place])    XRT   Stroke ([place])    Type 2 diabetes mellitus ([place])    Past Surgical History:  Procedure Laterality Date   COLONOSCOPY N/A 11/23/2015   Procedure: COLONOSCOPY;  Surgeon: Aviva Signs, MD;  Location: [facility];  Service: Gastroenterology;  Laterality: N/A;   FRACTURE SURGERY     PROSTATE BIOPSY     SINUS SURGERY WITH INSTATRAK      SOCIAL HISTORY:  Social History   Socioeconomic History   Marital status: Married    Spouse name: Not on file   Number of children: Not on file   Years of education: Not on file   Highest education level: Not on file  Occupational History   Not on file  Tobacco Use   Smoking status: Every Day    Packs/day: 2.00    Years: 44.00    Total pack years: 88.00    Types: Cigarettes   Smokeless tobacco: Never  Vaping Use   Vaping Use: Never used  Substance and Sexual Activity  Alcohol use: No    Alcohol/week: 0.0 standard drinks of alcohol   Drug use: No   Sexual activity: Not on file  Other Topics Concern   Not on file  Social History Narrative   Not on file   Social Determinants of Health   Financial Resource Strain: Not on file  Food Insecurity: Not on file  Transportation Needs: Not on file  Physical Activity: Not on file  Stress: Not on file  Social Connections: Not on file  Intimate Partner Violence: Not on file    FAMILY  HISTORY:  Family History  Problem Relation Age of Onset   Heart disease Mother    Skin cancer Mother    Heart disease Father        before age 51   Skin cancer Brother    Breast cancer Maternal Aunt    Cancer Neg Hx     CURRENT MEDICATIONS:  Current Outpatient Medications  Medication Sig Dispense Refill   abiraterone acetate (ZYTIGA) 250 MG tablet Take 4 tablets (1,000 mg total) by mouth daily. Take on an empty stomach 1 hour before or 2 hours after a meal 120 tablet 2   alprazolam (XANAX) 2 MG tablet Take 2 mg by mouth 2 (two) times daily.     Blood Glucose Monitoring Suppl (ACCU-CHEK GUIDE ME) w/Device KIT 1 Piece by Does not apply route as directed. 1 kit 0   citalopram (CELEXA) 40 MG tablet Take 20-40 mg by mouth daily.     glipiZIDE (GLUCOTROL) 5 MG tablet TAKE 1 TABLET BY MOUTH TWICE DAILY BEFORE a meal 180 tablet 0   glucose blood (ACCU-CHEK GUIDE) test strip Use to test glucose 4 times a day. 200 each 2   multivitamin (ONE-A-DAY MEN'S) TABS tablet Take 1 tablet by mouth daily.     predniSONE (DELTASONE) 5 MG tablet Take 1 tablet (5 mg total) by mouth daily with breakfast. 30 tablet 6   rosuvastatin (CRESTOR) 10 MG tablet Take 1 tablet (10 mg total) by mouth daily. 90 tablet 3   tamsulosin (FLOMAX) 0.4 MG CAPS capsule Take 1 capsule (0.4 mg total) by mouth in the morning and at bedtime. 60 capsule 11   No current facility-administered medications for this visit.    ALLERGIES:  Allergies  Allergen Reactions   Sulfa Antibiotics Hives, Itching and Swelling    Tongue swelling   Sulfasalazine Hives, Itching and Swelling    Tongue swelling   Lipitor [Atorvastatin] Other (See Comments)    myalgia   Simvastatin Other (See Comments)    myalgia    PHYSICAL EXAM:  Performance status (ECOG): 1 - Symptomatic but completely ambulatory  Vitals:   08/24/22 1228  BP: 109/72  Pulse: 80  Resp: 16  Temp: 97.7 F (36.5 C)  SpO2: 96%   Wt Readings from Last 3 Encounters:   08/24/22 157 lb 1.6 oz (71.3 kg)  08/02/22 155 lb 13.8 oz (70.7 kg)  07/12/22 150 lb 3.2 oz (68.1 kg)   Physical Exam Vitals reviewed.  Constitutional:      Appearance: Normal appearance.  Cardiovascular:     Rate and Rhythm: Normal rate and regular rhythm.     Pulses: Normal pulses.     Heart sounds: Normal heart sounds.  Pulmonary:     Effort: Pulmonary effort is normal.     Breath sounds: Normal breath sounds.  Neurological:     General: No focal deficit present.     Mental Status: He is alert and oriented  to person, place, and time.  Psychiatric:        Mood and Affect: Mood normal.        Behavior: Behavior normal.      LABORATORY DATA:  I have reviewed the labs as listed.     Latest Ref Rng & Units 08/24/2022   12:01 PM 08/02/2022   12:06 PM 07/05/2022    1:14 PM  CBC  WBC 4.0 - 10.5 K/uL 8.0  7.6  7.2   Hemoglobin 13.0 - 17.0 g/dL 12.4  12.0  12.3   Hematocrit 39.0 - 52.0 % 37.3  36.7  37.6   Platelets 150 - 400 K/uL 153  155  167       Latest Ref Rng & Units 08/24/2022   12:01 PM 08/02/2022   12:06 PM 07/05/2022    1:14 PM  CMP  Glucose 70 - 99 mg/dL 150  183  175   BUN 8 - 23 mg/dL '21  18  18   ' Creatinine 0.61 - 1.24 mg/dL 1.06  1.21  1.17   Sodium 135 - 145 mmol/L 137  136  137   Potassium 3.5 - 5.1 mmol/L 4.4  4.1  4.9   Chloride 98 - 111 mmol/L 102  104  103   CO2 22 - 32 mmol/L '27  24  27   ' Calcium 8.9 - 10.3 mg/dL 9.2  9.1  9.4   Total Protein 6.5 - 8.1 g/dL 7.6  7.2  7.5   Total Bilirubin 0.3 - 1.2 mg/dL 0.7  0.7  0.4   Alkaline Phos 38 - 126 U/L 87  79  74   AST 15 - 41 U/L '17  16  18   ' ALT 0 - 44 U/L '16  14  21     ' DIAGNOSTIC IMAGING:  I have independently reviewed the scans and discussed with the patient. No results found.   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 - Prostate IMRT from 12/13/2016 through 02/09/2017, 78 Gray in 40 fractions with  ADT. - Lupron started back on 09/01/2020 for rising PSA levels. - PSMA PET scan (03/30/2022): Nodes in the retroperitoneum, right pelvis, left lower jugular/supraclavicular stations.  No bone metastasis.  No local recurrence. - Enzalutamide started on 04/03/2022, discontinued due to extreme fatigue, vision changes and jaw pain. - Apalutamide started on 05/01/2022, taking only 2 pills but experiences jaw pain and neck pain and vision changes.  Discontinued on 05/09/2022 due to poor tolerance.       - XRT with Dr. Tammi Klippel from 05/30/2022 through 06/30/2022.       -Abiraterone 1000 mg and prednisone 5 mg started on 07/06/2022    Social/family history: - Lives at home with his wife.  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 - Current active smoker, 2 packs/day for 55 years.  Rolls his own cigarettes. - Mother had skin cancers.  3.  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 - Definitive SBRT from 10/05/2017 through 10/12/2017, 54 Gray in 3 fractions of [place].   PLAN:  Metastatic CRPC to the lymph nodes: - Abiraterone and prednisone started on 07/14/2022.  Blood pressure is normal. -Reviewed labs today which shows normal LFTs.  Potassium was normal.  CBC shows mild normocytic anemia.  Last PSA was 1.37 on 07/05/2022.  He reports having hot flashes 1 to 2/day.  He will  have follow-up with Dr. Alyson Ingles and have his next Eligard injection towards the end of the month.  Recommend continuing Abiraterone 1000 mg daily and prednisone 5 mg daily.  RTC 5 weeks for follow-up.  Plan to repeat PSA at that time along with routine labs.  2.  Bone health: - DEXA scan on 05/17/2022 with T score -2.3 consistent with osteopenia.       - We talked about starting him on denosumab.  We discussed side effects including hypocalcemia and rare chance of ONJ.  He is not interested at this time. - Recommend taking calcium and vitamin D supplements.   Orders  placed this encounter:  No orders of the defined types were placed in this encounter.     Derek Jack, MD [place] [phone]

## 2022-08-28 ENCOUNTER — Other Ambulatory Visit (HOSPITAL_COMMUNITY): Payer: Self-pay

## 2022-08-29 ENCOUNTER — Other Ambulatory Visit: Payer: Self-pay | Admitting: *Deleted

## 2022-08-29 DIAGNOSIS — C771 Secondary and unspecified malignant neoplasm of intrathoracic lymph nodes: Secondary | ICD-10-CM

## 2022-08-29 DIAGNOSIS — E559 Vitamin D deficiency, unspecified: Secondary | ICD-10-CM

## 2022-08-30 DIAGNOSIS — Z79818 Long term (current) use of other agents affecting estrogen receptors and estrogen levels: Secondary | ICD-10-CM | POA: Diagnosis not present

## 2022-08-30 DIAGNOSIS — E860 Dehydration: Secondary | ICD-10-CM | POA: Diagnosis not present

## 2022-08-30 DIAGNOSIS — Z79899 Other long term (current) drug therapy: Secondary | ICD-10-CM | POA: Diagnosis not present

## 2022-08-30 DIAGNOSIS — Z85118 Personal history of other malignant neoplasm of bronchus and lung: Secondary | ICD-10-CM | POA: Diagnosis not present

## 2022-08-30 DIAGNOSIS — R55 Syncope and collapse: Secondary | ICD-10-CM | POA: Diagnosis not present

## 2022-08-30 DIAGNOSIS — C61 Malignant neoplasm of prostate: Secondary | ICD-10-CM | POA: Diagnosis not present

## 2022-08-30 DIAGNOSIS — W19XXXA Unspecified fall, initial encounter: Secondary | ICD-10-CM | POA: Diagnosis not present

## 2022-08-30 DIAGNOSIS — E119 Type 2 diabetes mellitus without complications: Secondary | ICD-10-CM | POA: Diagnosis not present

## 2022-08-30 DIAGNOSIS — Z923 Personal history of irradiation: Secondary | ICD-10-CM | POA: Diagnosis not present

## 2022-08-30 DIAGNOSIS — Z7984 Long term (current) use of oral hypoglycemic drugs: Secondary | ICD-10-CM | POA: Diagnosis not present

## 2022-08-30 DIAGNOSIS — I10 Essential (primary) hypertension: Secondary | ICD-10-CM | POA: Diagnosis not present

## 2022-08-30 DIAGNOSIS — F17210 Nicotine dependence, cigarettes, uncomplicated: Secondary | ICD-10-CM | POA: Diagnosis not present

## 2022-08-30 DIAGNOSIS — Z7983 Long term (current) use of bisphosphonates: Secondary | ICD-10-CM | POA: Diagnosis not present

## 2022-08-30 DIAGNOSIS — S0990XA Unspecified injury of head, initial encounter: Secondary | ICD-10-CM | POA: Diagnosis not present

## 2022-08-30 DIAGNOSIS — Z8673 Personal history of transient ischemic attack (TIA), and cerebral infarction without residual deficits: Secondary | ICD-10-CM | POA: Diagnosis not present

## 2022-08-30 DIAGNOSIS — I7143 Infrarenal abdominal aortic aneurysm, without rupture: Secondary | ICD-10-CM | POA: Diagnosis not present

## 2022-08-30 DIAGNOSIS — R42 Dizziness and giddiness: Secondary | ICD-10-CM | POA: Diagnosis not present

## 2022-08-30 DIAGNOSIS — J439 Emphysema, unspecified: Secondary | ICD-10-CM | POA: Diagnosis not present

## 2022-08-30 DIAGNOSIS — I714 Abdominal aortic aneurysm, without rupture, unspecified: Secondary | ICD-10-CM | POA: Diagnosis not present

## 2022-08-31 ENCOUNTER — Other Ambulatory Visit (HOSPITAL_COMMUNITY): Payer: Self-pay

## 2022-09-05 ENCOUNTER — Other Ambulatory Visit: Payer: Medicare Other

## 2022-09-05 DIAGNOSIS — C61 Malignant neoplasm of prostate: Secondary | ICD-10-CM | POA: Diagnosis not present

## 2022-09-05 DIAGNOSIS — C775 Secondary and unspecified malignant neoplasm of intrapelvic lymph nodes: Secondary | ICD-10-CM | POA: Diagnosis not present

## 2022-09-06 ENCOUNTER — Other Ambulatory Visit (HOSPITAL_COMMUNITY): Payer: Self-pay

## 2022-09-07 ENCOUNTER — Other Ambulatory Visit (HOSPITAL_COMMUNITY): Payer: Self-pay

## 2022-09-07 LAB — PSA: Prostate Specific Ag, Serum: 0.3 ng/mL (ref 0.0–4.0)

## 2022-09-11 ENCOUNTER — Encounter: Payer: Self-pay | Admitting: "Endocrinology

## 2022-09-11 ENCOUNTER — Ambulatory Visit (INDEPENDENT_AMBULATORY_CARE_PROVIDER_SITE_OTHER): Payer: Medicare Other | Admitting: "Endocrinology

## 2022-09-11 VITALS — BP 110/58 | HR 76 | Ht 70.0 in | Wt 156.0 lb

## 2022-09-11 DIAGNOSIS — E559 Vitamin D deficiency, unspecified: Secondary | ICD-10-CM

## 2022-09-11 DIAGNOSIS — E782 Mixed hyperlipidemia: Secondary | ICD-10-CM

## 2022-09-11 DIAGNOSIS — I2510 Atherosclerotic heart disease of native coronary artery without angina pectoris: Secondary | ICD-10-CM | POA: Diagnosis not present

## 2022-09-11 DIAGNOSIS — F17200 Nicotine dependence, unspecified, uncomplicated: Secondary | ICD-10-CM

## 2022-09-11 DIAGNOSIS — E1159 Type 2 diabetes mellitus with other circulatory complications: Secondary | ICD-10-CM | POA: Diagnosis not present

## 2022-09-11 LAB — POCT GLYCOSYLATED HEMOGLOBIN (HGB A1C): HbA1c, POC (controlled diabetic range): 6.2 % (ref 0.0–7.0)

## 2022-09-11 MED ORDER — GLIPIZIDE 5 MG PO TABS: 5.0000 mg | ORAL_TABLET | Freq: Every day | ORAL | 1 refills | Status: DC

## 2022-09-11 NOTE — Progress Notes (Signed)
09/11/2022, 1:43 PM          Endocrinology follow-up note   Subjective:    Patient ID: Stephen Che., male    DOB: [birth year].  Stephen Che. is being seen in follow up in the management of his currently  controlled symptomatic type 2 diabetes requested by  [name], PA-C   Past Medical History:  Diagnosis Date   Abdominal aortic aneurysm (AAA) ([place])    Anxiety    COPD (chronic obstructive pulmonary disease) ([place])    Coronary artery calcification seen on CT scan    Essential hypertension    Lung cancer ([place])    Non small cell carcinoma - XRT   Non-small cell carcinoma of left lung, stage 1 ([place]) 09/21/2017   Prostate cancer ([place])    XRT   Stroke ([place])    Type 2 diabetes mellitus ([place])    Past Surgical History:  Procedure Laterality Date   COLONOSCOPY N/A 11/23/2015   Procedure: COLONOSCOPY;  Surgeon: Aviva Signs, MD;  Location: [facility];  Service: Gastroenterology;  Laterality: N/A;   FRACTURE SURGERY     PROSTATE BIOPSY     SINUS SURGERY WITH INSTATRAK     Social History   Socioeconomic History   Marital status: Married    Spouse name: Not on file   Number of children: Not on file   Years of education: Not on file   Highest education level: Not on file  Occupational History   Not on file  Tobacco Use   Smoking status: Every Day    Packs/day: 2.00    Years: 44.00    Total pack years: 88.00    Types: Cigarettes   Smokeless tobacco: Never  Vaping Use   Vaping Use: Never used  Substance and Sexual Activity   Alcohol use: No    Alcohol/week: 0.0 standard drinks of alcohol   Drug use: No   Sexual activity: Not on file  Other Topics Concern   Not on file  Social History Narrative   Not on file   Social Determinants of Health   Financial Resource Strain: Not on file  Food Insecurity: Not on file  Transportation Needs: Not on file  Physical Activity: Not on file  Stress: Not on file  Social  Connections: Not on file   Outpatient Encounter Medications as of 09/11/2022  Medication Sig   abiraterone acetate (ZYTIGA) 250 MG tablet Take 4 tablets (1,000 mg total) by mouth daily. Take on an empty stomach 1 hour before or 2 hours after a meal   alprazolam (XANAX) 2 MG tablet Take 2 mg by mouth 2 (two) times daily.   Blood Glucose Monitoring Suppl (ACCU-CHEK GUIDE ME) w/Device KIT 1 Piece by Does not apply route as directed.   citalopram (CELEXA) 40 MG tablet Take 20-40 mg by mouth daily.   glipiZIDE (GLUCOTROL) 5 MG tablet Take 1 tablet (5 mg total) by mouth daily before breakfast.   glucose blood (ACCU-CHEK GUIDE) test strip Use to test glucose 4 times a day.   multivitamin (ONE-A-DAY MEN'S) TABS tablet Take 1 tablet by mouth daily.   predniSONE (DELTASONE) 5 MG tablet Take 1 tablet (5 mg total) by mouth daily with breakfast.   rosuvastatin (CRESTOR) 10 MG tablet Take 1 tablet (10 mg total) by mouth daily.   tamsulosin (FLOMAX) 0.4 MG CAPS capsule Take 1 capsule (0.4 mg total) by mouth in the morning and at  bedtime.   [DISCONTINUED] glipiZIDE (GLUCOTROL) 5 MG tablet TAKE 1 TABLET BY MOUTH TWICE DAILY BEFORE a meal   No facility-administered encounter medications on file as of 09/11/2022.    ALLERGIES: Allergies  Allergen Reactions   Sulfa Antibiotics Hives, Itching and Swelling    Tongue swelling   Sulfasalazine Hives, Itching and Swelling    Tongue swelling   Lipitor [Atorvastatin] Other (See Comments)    myalgia   Simvastatin Other (See Comments)    myalgia    VACCINATION STATUS:  There is no immunization history on file for this patient.  Diabetes He presents for his follow-up diabetic visit. He has type 2 diabetes mellitus. Onset time: He was diagnosed at approximate age of 84 years. His disease course has been improving. There are no hypoglycemic associated symptoms. Pertinent negatives for hypoglycemia include no confusion, headaches, pallor or seizures. Associated  symptoms include blurred vision. Pertinent negatives for diabetes include no chest pain, no fatigue, no polydipsia, no polyphagia, no polyuria and no weakness. There are no hypoglycemic complications. Symptoms are improving. Diabetic complications include a CVA. Risk factors for coronary artery disease include diabetes mellitus, hypertension, male sex and tobacco exposure. Current diabetic treatment includes oral agent (monotherapy) (Is currently taking glipizide 10 mg p.o. twice daily.). His weight is stable. He is following a generally unhealthy diet. When asked about meal planning, he reported none. He has not had a previous visit with a dietitian. He never participates in exercise. His home blood glucose trend is decreasing steadily. His breakfast blood glucose range is generally 130-140 mg/dl. His bedtime blood glucose range is generally 130-140 mg/dl. His overall blood glucose range is 130-140 mg/dl. (He presents with controlled glycemic profile.  His point-of-care A1c is 6.2%.  He has some rare, random, mild hypoglycemia.    ) An ACE inhibitor/angiotensin II receptor blocker is not being taken. Eye exam is current.   Review of systems: Limited as above.    Objective:    BP (!) 110/58 (BP Location: Left Arm, Patient Position: Sitting) Comment: 110/62 standing  Pulse 76   Ht '5\' 10"'  (1.778 m)   Wt 156 lb (70.8 kg)   BMI 22.38 kg/m   Wt Readings from Last 3 Encounters:  09/11/22 156 lb (70.8 kg)  08/24/22 157 lb 1.6 oz (71.3 kg)  08/02/22 155 lb 13.8 oz (70.7 kg)       Recent Results (from the past 2160 hour(s))  Rad Onc [facility] Session Summary     Status: None   Collection Time: 06/21/22  1:28 PM  Result Value Ref Range   Course ID C2_Multi    Course Intent Unknown    Course Start Date 05/18/2022  2:04 PM    Session Number 11    Course First Treatment Date 05/30/2022  2:08 PM    Course Last Treatment Date 06/21/2022  1:26 PM    Course Elapsed Days 22    Reference Point ID [email or web address] DP     Reference Point Dosage Given to Date 7.99999999 Gy   Reference Point Session Dosage Given 0.30092330 Gy   Plan ID [email or web address]_SBRT    Plan Name [email or web address]_L_SBRT    Plan Fractions Treated to Date 1    Plan Total Fractions Prescribed 5    Plan Prescribed Dose Per Fraction 8 Gy   Plan Total Prescribed Dose 40.000000 Gy   Plan Primary Reference Point [email or web address] DP   Rad Onc Aria Session Summary     Status: None   Collection Time: 06/23/22  2:10 PM  Result Value Ref Range   Course ID C2_Multi    Course Intent Unknown    Course Start Date 05/18/2022  2:04 PM    Session Number 12    Course First Treatment Date 05/30/2022  2:08 PM    Course Last Treatment Date 06/23/2022  2:08 PM    Course Elapsed Days 24    Reference Point ID [email or web address] DP    Reference Point Dosage Given to Date 15.99999998 Gy   Reference Point Session Dosage Given 4.56256389 Gy   Plan ID [email or web address]_L_SBRT    Plan Name SCV_L_SBRT    Plan Fractions Treated to Date 2    Plan Total Fractions Prescribed 5    Plan Prescribed Dose Per Fraction 8 Gy   Plan Total Prescribed Dose 40.000000 Gy   Plan Primary Reference Point SCV_[email or web address] DP   Rad Onc Aria Session Summary     Status: None   Collection Time: 06/26/22  1:31 PM  Result Value Ref Range   Course ID C2_Multi    Course Intent Unknown    Course Start Date 05/18/2022  2:04 PM    Session Number 13    Course First Treatment Date 05/30/2022  2:08 PM    Course Last Treatment Date 06/26/2022  1:28 PM    Course Elapsed Days 27    Reference Point ID [email or web address] DP    Reference Point Dosage Given to Date 37.34287681 Gy   Reference Point Session Dosage Given 1.57262035 Gy   Plan ID SCV_L_SBRT    Plan Name SCV_L_SBRT    Plan Fractions Treated to Date 3    Plan Total Fractions Prescribed 5    Plan Prescribed Dose Per Fraction 8 Gy   Plan Total Prescribed Dose 40.000000 Gy   Plan Primary Reference Point SCV_L_SBRT DP   Rad Onc Aria Session Summary     Status: None   Collection Time: 06/28/22  1:27  PM  Result Value Ref Range   Course ID C2_Multi    Course Intent Unknown    Course Start Date 05/18/2022  2:04 PM    Session Number 14    Course First Treatment Date 05/30/2022  2:08 PM    Course Last Treatment Date 06/28/2022  1:25 PM    Course Elapsed Days 29    Reference Point ID [email or web address] DP    Reference Point Dosage Given to Date 31.99999996 Gy   Reference Point Session Dosage Given 5.97416384 Gy   Plan ID SCV_L_SBRT    Plan Name SCV_L_SBRT    Plan Fractions Treated to Date 4    Plan Total Fractions Prescribed 5    Plan Prescribed Dose Per Fraction 8 Gy   Plan Total Prescribed Dose 40.000000 Gy   Plan Primary Reference Point [email or web address] DP   Rad Onc Aria Session Summary     Status: None   Collection Time: 06/30/22  1:29 PM  Result Value Ref Range   Course ID C2_Multi    Course Intent Unknown    Course Start Date 05/18/2022  2:04 PM    Session Number 15    Course First Treatment Date 05/30/2022  2:08 PM    Course Last Treatment Date 06/30/2022  1:27 PM    Course Elapsed Days 31    Reference Point ID [email or web address] DP    Reference Point Dosage Given to Date 39.99999995 Gy   Reference Point Session Dosage Given 5.36468032 Gy   Plan ID SCV_L_SBRT    Plan Name SCV_L_SBRT    Plan Fractions  Treated to Date 5    Plan Total Fractions Prescribed 5    Plan Prescribed Dose Per Fraction 8 Gy   Plan Total Prescribed Dose 40.000000 Gy   Plan Primary Reference Point SCV_L_SBRT DP   CBC with Differential     Status: Abnormal   Collection Time: 07/05/22  1:14 PM  Result Value Ref Range   WBC 7.2 4.0 - 10.5 K/uL   RBC 3.97 (L) 4.22 - 5.81 MIL/uL   Hemoglobin 12.3 (L) 13.0 - 17.0 g/dL   HCT 37.6 (L) 39.0 - 52.0 %   MCV 94.7 80.0 - 100.0 fL   MCH 31.0 26.0 - 34.0 pg   MCHC 32.7 30.0 - 36.0 g/dL   RDW 14.4 11.5 - 15.5 %   Platelets 167 150 - 400 K/uL   nRBC 0.0 0.0 - 0.2 %   Neutrophils Relative % 85 %   Neutro Abs 6.0 1.7 - 7.7 K/uL   Lymphocytes Relative 6 %   Lymphs Abs 0.5 (L) 0.7 - 4.0  K/uL   Monocytes Relative 8 %   Monocytes Absolute 0.6 0.1 - 1.0 K/uL   Eosinophils Relative 1 %   Eosinophils Absolute 0.1 0.0 - 0.5 K/uL   Basophils Relative 0 %   Basophils Absolute 0.0 0.0 - 0.1 K/uL   Immature Granulocytes 0 %   Abs Immature Granulocytes 0.03 0.00 - 0.07 K/uL    Comment: Performed at [facility], [address], [place], [place] [address]  Comprehensive metabolic panel     Status: Abnormal   Collection Time: 07/05/22  1:14 PM  Result Value Ref Range   Sodium 137 135 - 145 mmol/L   Potassium 4.9 3.5 - 5.1 mmol/L   Chloride 103 98 - 111 mmol/L   CO2 27 22 - 32 mmol/L   Glucose, Bld 175 (H) 70 - 99 mg/dL    Comment: Glucose reference range applies only to samples taken after fasting for at least 8 hours.   BUN 18 8 - 23 mg/dL   Creatinine, Ser 1.17 0.61 - 1.24 mg/dL   Calcium 9.4 8.9 - 10.3 mg/dL   Total Protein 7.5 6.5 - 8.1 g/dL   Albumin 4.1 3.5 - 5.0 g/dL   AST 18 15 - 41 U/L   ALT 21 0 - 44 U/L   Alkaline Phosphatase 74 38 - 126 U/L   Total Bilirubin 0.4 0.3 - 1.2 mg/dL   GFR, Estimated >60 >60 mL/min    Comment: (NOTE) Calculated using the CKD-EPI Creatinine Equation (2021)    Anion gap 7 5 - 15    Comment: Performed at [facility], [address], [place], [place] [address]  PSA     Status: None   Collection Time: 07/05/22  1:15 PM  Result Value Ref Range   Prostatic Specific Antigen 1.37 0.00 - 4.00 ng/mL    Comment: (NOTE) While PSA levels of <=4.0 ng/ml are reported as reference range, some men with levels below 4.0 ng/ml can have prostate cancer and many men with PSA above 4.0 ng/ml do not have prostate cancer.  Other tests such as free PSA, age specific reference ranges, PSA velocity and PSA doubling time may be helpful especially in men less than 54 years old. Performed at [facility], [place] [address], [place], [place] [address]   CBC with Differential/Platelet     Status: Abnormal   Collection Time: 08/02/22 12:06 PM  Result  Value Ref Range   WBC 7.6 4.0 - 10.5  K/uL   RBC 3.76 (L) 4.22 - 5.81 MIL/uL   Hemoglobin 12.0 (L) 13.0 - 17.0 g/dL   HCT 36.7 (L) 39.0 - 52.0 %   MCV 97.6 80.0 - 100.0 fL   MCH 31.9 26.0 - 34.0 pg   MCHC 32.7 30.0 - 36.0 g/dL   RDW 15.8 (H) 11.5 - 15.5 %   Platelets 155 150 - 400 K/uL   nRBC 0.0 0.0 - 0.2 %   Neutrophils Relative % 82 %   Neutro Abs 6.3 1.7 - 7.7 K/uL   Lymphocytes Relative 7 %   Lymphs Abs 0.5 (L) 0.7 - 4.0 K/uL   Monocytes Relative 8 %   Monocytes Absolute 0.6 0.1 - 1.0 K/uL   Eosinophils Relative 2 %   Eosinophils Absolute 0.1 0.0 - 0.5 K/uL   Basophils Relative 0 %   Basophils Absolute 0.0 0.0 - 0.1 K/uL   Immature Granulocytes 1 %   Abs Immature Granulocytes 0.05 0.00 - 0.07 K/uL    Comment: Performed at [facility], [address] [place], [place] [address]  Comprehensive metabolic panel     Status: Abnormal   Collection Time: 08/02/22 12:06 PM  Result Value Ref Range   Sodium 136 135 - 145 mmol/L   Potassium 4.1 3.5 - 5.1 mmol/L   Chloride 104 98 - 111 mmol/L   CO2 24 22 - 32 mmol/L   Glucose, Bld 183 (H) 70 - 99 mg/dL    Comment: Glucose reference range applies only to samples taken after fasting for at least 8 hours.   BUN 18 8 - 23 mg/dL   Creatinine, Ser 1.21 0.61 - 1.24 mg/dL   Calcium 9.1 8.9 - 10.3 mg/dL   Total Protein 7.2 6.5 - 8.1 g/dL   Albumin 4.0 3.5 - 5.0 g/dL   AST 16 15 - 41 U/L   ALT 14 0 - 44 U/L   Alkaline Phosphatase 79 38 - 126 U/L   Total Bilirubin 0.7 0.3 - 1.2 mg/dL   GFR, Estimated >60 >60 mL/min    Comment: (NOTE) Calculated using the CKD-EPI Creatinine Equation (2021)    Anion gap 8 5 - 15    Comment: Performed at [facility], [address] [place], [place] [address]  Magnesium     Status: None   Collection Time: 08/02/22 12:06 PM  Result Value Ref Range   Magnesium 2.2 1.7 - 2.4 mg/dL    Comment: Performed at [facility], [address], [place], [place] [address]  Magnesium     Status: None   Collection  Time: 08/24/22 12:01 PM  Result Value Ref Range   Magnesium 2.4 1.7 - 2.4 mg/dL    Comment: Performed at [facility], [address], [place], [place] [address]  Comprehensive metabolic panel     Status: Abnormal   Collection Time: 08/24/22 12:01 PM  Result Value Ref Range   Sodium 137 135 - 145 mmol/L   Potassium 4.4 3.5 - 5.1 mmol/L   Chloride 102 98 - 111 mmol/L   CO2 27 22 - 32 mmol/L   Glucose, Bld 150 (H) 70 - 99 mg/dL    Comment: Glucose reference range applies only to samples taken after fasting for at least 8 hours.   BUN 21 8 - 23 mg/dL   Creatinine, Ser 1.06 0.61 - 1.24 mg/dL   Calcium 9.2 8.9 - 10.3 mg/dL   Total Protein 7.6 6.5 - 8.1 g/dL   Albumin 4.1 3.5 - 5.0 g/dL  AST 17 15 - 41 U/L   ALT 16 0 - 44 U/L   Alkaline Phosphatase 87 38 - 126 U/L   Total Bilirubin 0.7 0.3 - 1.2 mg/dL   GFR, Estimated >60 >60 mL/min    Comment: (NOTE) Calculated using the CKD-EPI Creatinine Equation (2021)    Anion gap 8 5 - 15    Comment: Performed at [facility], [address], [place], [place] [address]  CBC with Differential     Status: Abnormal   Collection Time: 08/24/22 12:01 PM  Result Value Ref Range   WBC 8.0 4.0 - 10.5 K/uL   RBC 3.80 (L) 4.22 - 5.81 MIL/uL   Hemoglobin 12.4 (L) 13.0 - 17.0 g/dL   HCT 37.3 (L) 39.0 - 52.0 %   MCV 98.2 80.0 - 100.0 fL   MCH 32.6 26.0 - 34.0 pg   MCHC 33.2 30.0 - 36.0 g/dL   RDW 15.2 11.5 - 15.5 %   Platelets 153 150 - 400 K/uL   nRBC 0.0 0.0 - 0.2 %   Neutrophils Relative % 83 %   Neutro Abs 6.7 1.7 - 7.7 K/uL   Lymphocytes Relative 7 %   Lymphs Abs 0.6 (L) 0.7 - 4.0 K/uL   Monocytes Relative 7 %   Monocytes Absolute 0.6 0.1 - 1.0 K/uL   Eosinophils Relative 2 %   Eosinophils Absolute 0.1 0.0 - 0.5 K/uL   Basophils Relative 0 %   Basophils Absolute 0.0 0.0 - 0.1 K/uL   Immature Granulocytes 1 %   Abs Immature Granulocytes 0.05 0.00 - 0.07 K/uL    Comment: Performed at [facility], [address] [place], [place] [address]   PSA     Status: None   Collection Time: 09/05/22  1:37 PM  Result Value Ref Range   Prostate Specific Ag, Serum 0.3 0.0 - 4.0 ng/mL    Comment: Roche ECLIA methodology. According to the American Urological Association, Serum PSA should decrease and remain at undetectable levels after radical prostatectomy. The AUA defines biochemical recurrence as an initial PSA value 0.2 ng/mL or greater followed by a subsequent confirmatory PSA value 0.2 ng/mL or greater. Values obtained with different assay methods or kits cannot be used interchangeably. Results cannot be interpreted as absolute evidence of the presence or absence of malignant disease.   HgB A1c     Status: None   Collection Time: 09/11/22  1:35 PM  Result Value Ref Range   Hemoglobin A1C     HbA1c POC (<> result, manual entry)     HbA1c, POC (prediabetic range)     HbA1c, POC (controlled diabetic range) 6.2 0.0 - 7.0 %      Assessment & Plan:   1. DM type 2 causing vascular disease ([place])  - Stephen Che. has currently uncontrolled symptomatic type 2 DM since 71 years of age. He presents with controlled glycemic profile.  His point-of-care A1c is 6.2%.  He has some rare, random, mild hypoglycemia.    Recent labs are reviewed with him.   -his diabetes is complicated by CVA , chronic heavy smoking, and he remains at extremely high risk for more acute and chronic complications which include CAD, CVA, CKD, retinopathy, and neuropathy. These are all discussed in detail with him.  - I have counseled him on diet management by adopting a carbohydrate restricted/protein rich diet.  - he  admits there is a room for improvement in his diet and drink choices. -  Suggestion is  made for him to avoid simple carbohydrates  from his diet including Cakes, Sweet Desserts / Pastries, Ice Cream, Soda (diet and regular), Sweet Tea, Candies, Chips, Cookies, Sweet Pastries,  Store Bought Juices, Alcohol in Excess of  1-2 drinks a day,  Artificial Sweeteners, Coffee Creamer, and "Sugar-free" Products. This will help patient to have stable blood glucose profile and potentially avoid unintended weight gain.  - I encouraged him to switch to  unprocessed or minimally processed complex starch and increased protein intake (animal or plant source), fruits, and vegetables.  - he is advised to stick to a routine mealtimes to eat 3 meals  a day and avoid unnecessary snacks ( to snack only to correct hypoglycemia).    - I have approached him with the following individualized plan to manage diabetes and patient agrees:   -Based on his presentation with tight glycemic control, he is advised to lower glipizide to 5 mg p.o. daily at breakfast only.  He agrees to continue monitoring blood glucose at least twice a day-daily before breakfast and at bedtime.  - he is encouraged to call clinic for blood glucose levels less than 70 or above 200 mg /dl.  -He is not a suitable candidate for metformin, SGLT2 inhibitors, nor incretin therapy. -He will be reapproached for insulin treatment if he loses control by next visit.  - Patient specific target  A1c;  LDL, HDL, Triglycerides  were discussed in detail.  2) BP/HTN:  -His blood pressure is controlled to target.   He is not on any antihypertensive medications, reported sulfa allergy.   3) Lipids/HPL: His recent labs show LDL of 83.  He is on Crestor 20 mg po qhs.  4)  Weight/Diet: He was recently diagnosed with lung cancer waiting for radiation therapy.  Is not a candidate for weight loss.   The patient was counseled on the dangers of tobacco use, and was advised to quit.  Reviewed strategies to maximize success, including removing cigarettes and smoking materials from environment.   5) Chronic Care/Health Maintenance:  -he  Is not on ACEI/ARB and Statin medications and  is encouraged to initiate and continue to follow up with Ophthalmology, Dentist,  Podiatrist at least yearly or according  to recommendations, and advised to  Quit smoking (this is his #1health risk). I have recommended yearly flu vaccine and pneumonia vaccine at least every 5 years; moderate intensity exercise for up to 150 minutes weekly; and  sleep for at least 7 hours a day.  The patient was counseled on the dangers of tobacco use, and was advised to quit.  Reviewed strategies to maximize success, including removing cigarettes and smoking materials from environment.  - I advised patient to maintain close follow up with [name], PA-C for primary care needs.    I spent 32 minutes in the care of the patient today including review of labs from Thyroid Function, CMP, and other relevant labs ; imaging/biopsy records (current and previous including abstractions from other facilities); face-to-face time discussing  his lab results and symptoms, medications doses, his options of short and long term treatment based on the latest standards of care / guidelines;   and documenting the encounter.  Stephen Che.  participated in the discussions, expressed understanding, and voiced agreement with the above plans.  All questions were answered to his satisfaction. he is encouraged to contact clinic should he have any questions or concerns prior to his return visit.   Follow up plan: - Return  in about 6 months (around 03/12/2023) for Bring Meter/CGM Device/Logs- A1c in Office.  Glade Lloyd, MD [facility] [address] [place], [place] [address] Phone: [phone]  Fax: [phone]    09/11/2022, 1:43 PM  This note was partially dictated with voice recognition software. Similar sounding words can be transcribed inadequately or may not  be corrected upon review.

## 2022-09-12 ENCOUNTER — Ambulatory Visit (INDEPENDENT_AMBULATORY_CARE_PROVIDER_SITE_OTHER): Payer: Medicare Other | Admitting: Urology

## 2022-09-12 ENCOUNTER — Encounter: Payer: Self-pay | Admitting: Urology

## 2022-09-12 VITALS — BP 95/61 | HR 101

## 2022-09-12 DIAGNOSIS — N138 Other obstructive and reflux uropathy: Secondary | ICD-10-CM | POA: Diagnosis not present

## 2022-09-12 DIAGNOSIS — C775 Secondary and unspecified malignant neoplasm of intrapelvic lymph nodes: Secondary | ICD-10-CM

## 2022-09-12 DIAGNOSIS — R351 Nocturia: Secondary | ICD-10-CM

## 2022-09-12 DIAGNOSIS — C61 Malignant neoplasm of prostate: Secondary | ICD-10-CM | POA: Diagnosis not present

## 2022-09-12 DIAGNOSIS — I2510 Atherosclerotic heart disease of native coronary artery without angina pectoris: Secondary | ICD-10-CM | POA: Diagnosis not present

## 2022-09-12 DIAGNOSIS — N401 Enlarged prostate with lower urinary tract symptoms: Secondary | ICD-10-CM

## 2022-09-12 MED ORDER — TAMSULOSIN HCL 0.4 MG PO CAPS: 0.4000 mg | ORAL_CAPSULE | Freq: Two times a day (BID) | ORAL | 11 refills | Status: DC

## 2022-09-12 MED ORDER — LEUPROLIDE ACETATE (6 MONTH) 45 MG ~~LOC~~ KIT
45.0000 mg | PACK | Freq: Once | SUBCUTANEOUS | Status: AC
  Administered 2022-09-12: 45 mg via SUBCUTANEOUS

## 2022-09-12 NOTE — Patient Instructions (Signed)

## 2022-09-12 NOTE — Progress Notes (Signed)
09/12/2022 10:56 AM   Stephen Palmer April 08, 1951 [id number]  Referring provider: [name], PA-C [address] [place],  [place] [address]  Followup prostate cancer and BPH   HPI: Stephen Palmer is a 71yo here for followup for BPH with nocturia and prostate cancer. PSA decreased to 0.3. He remains on zytiga. He completed radiation therapy this month. He is having fatigue. He fell 1 week ago due to dizziness. He denies any significant LUTS. IPSS 4 QOL 1 on flomax 0.65m BID.    PMH: Past Medical History:  Diagnosis Date   Abdominal aortic aneurysm (AAA) (HCC)    Anxiety    COPD (chronic obstructive pulmonary disease) (HCC)    Coronary artery calcification seen on CT scan    Essential hypertension    Lung cancer (HCC)    Non small cell carcinoma - XRT   Non-small cell carcinoma of left lung, stage 1 (HCC) 09/21/2017   [place]state cancer ([place]    XRT   Stroke ([place]    Type 2 diabetes mellitus ([facility]     Surgical History: Past Surgical History:  Procedure Laterality Date   COLONOSCOPY N/A 11/23/2015   Procedure: COLONOSCOPY;  Surgeon: MAviva Signs MD;  [facility];  Service: Gastroenterology;  Laterality: N/A;   FRACTURE SURGERY     PROSTATE BIOPSY     SINUS SURGERY WITH INSTATRAK      Home Medications:  Allergies as of 09/12/2022       Reactions   Sulfa Antibiotics Hives, Itching, Swelling   Tongue swelling   Sulfasalazine Hives, Itching, Swelling   Tongue swelling   Lipitor [atorvastatin] Other (See Comments)   myalgia   Simvastatin Other (See Comments)   myalgia        Medication List        Accurate as of September 12, 2022 10:56 AM. If you have any questions, ask your nurse or doctor.          abiraterone acetate 250 MG tablet Commonly known as: ZYTIGA Take 4 tablets (1,000 mg total) by mouth daily. Take on an empty stomach 1 hour before or 2 hours after a meal   Accu-Chek Guide Me w/Device Kit 1 Piece by Does not apply  route as directed.   Accu-Chek Guide test strip Generic drug: glucose blood Use to test glucose 4 times a day.   alprazolam 2 MG tablet Commonly known as: XANAX Take 2 mg by mouth 2 (two) times daily.   citalopram 40 MG tablet Commonly known as: CELEXA Take 20-40 mg by mouth daily.   glipiZIDE 5 MG tablet Commonly known as: GLUCOTROL Take 1 tablet (5 mg total) by mouth daily before breakfast.   multivitamin Tabs tablet Take 1 tablet by mouth daily.   predniSONE 5 MG tablet Commonly known as: DELTASONE Take 1 tablet (5 mg total) by mouth daily with breakfast.   rosuvastatin 10 MG tablet Commonly known as: CRESTOR Take 1 tablet (10 mg total) by mouth daily.   tamsulosin 0.4 MG Caps capsule Commonly known as: FLOMAX Take 1 capsule (0.4 mg total) by mouth in the morning and at bedtime.        Allergies:  Allergies  Allergen Reactions   Sulfa Antibiotics Hives, Itching and Swelling    Tongue swelling   Sulfasalazine Hives, Itching and Swelling    Tongue swelling   Lipitor [Atorvastatin] Other (See Comments)    myalgia   Simvastatin Other (See Comments)    myalgia  Family History: Family History  Problem Relation Age of Onset   Heart disease Mother    Skin cancer Mother    Heart disease Father        before age 59   Skin cancer Brother    Breast cancer Maternal Aunt    Cancer Neg Hx     Social History:  reports that he has been smoking cigarettes. He has a 88.00 pack-year smoking history. He has never used smokeless tobacco. He reports that he does not drink alcohol and does not use drugs.  ROS: All other review of systems were reviewed and are negative except what is noted above in HPI  Physical Exam: There were no vitals taken for this visit.  Constitutional:  Alert and oriented, No acute distress. HEENT: [place] AT, moist mucus membranes.  Trachea midline, no masses. Cardiovascular: No clubbing, cyanosis, or edema. Respiratory: Normal respiratory  effort, no increased work of breathing. GI: Abdomen is soft, nontender, nondistended, no abdominal masses GU: No CVA tenderness.  Lymph: No cervical or inguinal lymphadenopathy. Skin: No rashes, bruises or suspicious lesions. Neurologic: Grossly intact, no focal deficits, moving all 4 extremities. Psychiatric: Normal mood and affect.  Laboratory Data: Lab Results  Component Value Date   WBC 8.0 08/24/2022   HGB 12.4 (L) 08/24/2022   HCT 37.3 (L) 08/24/2022   MCV 98.2 08/24/2022   PLT 153 08/24/2022    Lab Results  Component Value Date   CREATININE 1.06 08/24/2022    Lab Results  Component Value Date   PSA 9.7 (H) 05/19/2020   PSA 8.7 (H) 05/11/2020    Lab Results  Component Value Date   TESTOSTERONE 166 (L) 05/11/2020    Lab Results  Component Value Date   HGBA1C 6.2 09/11/2022    Urinalysis    Component Value Date/Time   APPEARANCEUR Clear 06/06/2022 1048   GLUCOSEU Negative 06/06/2022 1048   BILIRUBINUR Negative 06/06/2022 1048   PROTEINUR 1+ (A) 06/06/2022 1048   NITRITE Negative 06/06/2022 1048   LEUKOCYTESUR Negative 06/06/2022 1048    Lab Results  Component Value Date   LABMICR Comment 05/01/2022    Pertinent Imaging:  Results for orders placed during the hospital encounter of 05/14/08  DG Abd 1 View  Narrative Clinical Data: Left ureteral calculus.  ABDOMEN - 1 VIEW  Comparison: CT abdomen and pelvis 04/08/08.  Findings: No unexpected calcifications are seen over the renal shadows, expected course of the ureters or urinary bladder. Specifically, the small left UVJ stone seen the patient's CT scan is not visible on this study.  Bowel gas pattern is normal.  No focal bony abnormality.  IMPRESSION: Negative for plain film evidence of urinary tract calculi.  Provider: Jennye Boroughs, Brooke Dare  No results found for this or any previous visit.  No results found for this or any previous visit.  No results found for this or any  previous visit.  No results found for this or any previous visit.  No valid procedures specified. No results found for this or any previous visit.  No results found for this or any previous visit.   Assessment & Plan:    1. Prostate cancer metastatic to intrapelvic lymph node (HCC) -Eligard 47m today -Continue zytiga RTC 6 months with a PSA  2. Benign prostatic hyperplasia with urinary obstruction -Continue flomax 0.452mBID  3. Nocturia -Continue flomax 0.77m49mID   No follow-ups on file.  PatNicolette BangD  [facility] [place]

## 2022-09-12 NOTE — Progress Notes (Signed)
Eligard SubQ Injection   Due to Prostate Cancer patient is present today for a Eligard Injection.  Medication: Eligard 6 month Dose: 45 mg  Location: right   Patient tolerated well, no complications were noted  Performed by: Rennie Hack Lpn

## 2022-09-13 LAB — URINALYSIS, ROUTINE W REFLEX MICROSCOPIC
Bilirubin, UA: NEGATIVE
Glucose, UA: NEGATIVE
Leukocytes,UA: NEGATIVE
Nitrite, UA: NEGATIVE
Protein,UA: NEGATIVE
RBC, UA: NEGATIVE
Specific Gravity, UA: 1.01 (ref 1.005–1.030)
Urobilinogen, Ur: 0.2 mg/dL (ref 0.2–1.0)
pH, UA: 6 (ref 5.0–7.5)

## 2022-09-19 ENCOUNTER — Telehealth: Payer: Self-pay | Admitting: Cardiology

## 2022-09-19 NOTE — Telephone Encounter (Signed)
Patient notified and verbalized understanding. Patient stated that he will call PCP's office and obtain appointment.  Patient had no further questions or concerns at this time.

## 2022-09-19 NOTE — Telephone Encounter (Signed)
Spoke to pt who c/o dizziness/ low bp. Pt stated that when he is sitting he feels fine, but once he stands he feels dizzy as if he is going to pass out. Pt stated that a few weeks ago he passed out at [employer] and hit his head, but did not go to the hospital. Pt stated that he does not keep a bp/hr log, but would start keeping one. Pt checked bp while on the phone and stated it was 100/64 hr 80. Pt stated that he has checked bp before with it being 75/54. Pt denies headaches (pt has hx of cataracts, so vision is always blurry). Please advise.

## 2022-09-19 NOTE — Telephone Encounter (Signed)
Pt c/o BP issue: STAT if pt c/o blurred vision, one-sided weakness or slurred speech  1. What are your last 5 BP readings? Don't have  2. Are you having any other symptoms (ex. Dizziness, headache, blurred vision, passed out)? Dizziness and feels like going to pass out  3. What is your BP issue? Pt states that BP has been running low for about a year. Pt would like a callback regarding this matter

## 2022-09-25 ENCOUNTER — Inpatient Hospital Stay: Payer: Medicare Other | Attending: Genetics

## 2022-09-25 ENCOUNTER — Other Ambulatory Visit (HOSPITAL_COMMUNITY): Payer: Self-pay

## 2022-09-25 DIAGNOSIS — I10 Essential (primary) hypertension: Secondary | ICD-10-CM | POA: Diagnosis not present

## 2022-09-25 DIAGNOSIS — E119 Type 2 diabetes mellitus without complications: Secondary | ICD-10-CM | POA: Insufficient documentation

## 2022-09-25 DIAGNOSIS — C61 Malignant neoplasm of prostate: Secondary | ICD-10-CM | POA: Insufficient documentation

## 2022-09-25 DIAGNOSIS — F17210 Nicotine dependence, cigarettes, uncomplicated: Secondary | ICD-10-CM | POA: Insufficient documentation

## 2022-09-25 DIAGNOSIS — M8580 Other specified disorders of bone density and structure, unspecified site: Secondary | ICD-10-CM | POA: Diagnosis not present

## 2022-09-25 DIAGNOSIS — Z808 Family history of malignant neoplasm of other organs or systems: Secondary | ICD-10-CM | POA: Insufficient documentation

## 2022-09-25 DIAGNOSIS — Z803 Family history of malignant neoplasm of breast: Secondary | ICD-10-CM | POA: Insufficient documentation

## 2022-09-25 DIAGNOSIS — C775 Secondary and unspecified malignant neoplasm of intrapelvic lymph nodes: Secondary | ICD-10-CM | POA: Insufficient documentation

## 2022-09-25 DIAGNOSIS — C771 Secondary and unspecified malignant neoplasm of intrathoracic lymph nodes: Secondary | ICD-10-CM

## 2022-09-25 DIAGNOSIS — Z85118 Personal history of other malignant neoplasm of bronchus and lung: Secondary | ICD-10-CM | POA: Insufficient documentation

## 2022-09-25 DIAGNOSIS — Z7952 Long term (current) use of systemic steroids: Secondary | ICD-10-CM | POA: Diagnosis not present

## 2022-09-25 DIAGNOSIS — E559 Vitamin D deficiency, unspecified: Secondary | ICD-10-CM

## 2022-09-25 LAB — COMPREHENSIVE METABOLIC PANEL
ALT: 17 U/L (ref 0–44)
AST: 18 U/L (ref 15–41)
Albumin: 4.1 g/dL (ref 3.5–5.0)
Alkaline Phosphatase: 91 U/L (ref 38–126)
Anion gap: 6 (ref 5–15)
BUN: 16 mg/dL (ref 8–23)
CO2: 26 mmol/L (ref 22–32)
Calcium: 9.2 mg/dL (ref 8.9–10.3)
Chloride: 106 mmol/L (ref 98–111)
Creatinine, Ser: 1.06 mg/dL (ref 0.61–1.24)
GFR, Estimated: >60 mL/min (ref >60)
Glucose, Bld: 127 mg/dL — ABNORMAL HIGH (ref 70–99)
Potassium: 4.4 mmol/L (ref 3.5–5.1)
Sodium: 138 mmol/L (ref 135–145)
Total Bilirubin: 0.7 mg/dL (ref 0.3–1.2)
Total Protein: 7.5 g/dL (ref 6.5–8.1)

## 2022-09-25 LAB — CBC WITH DIFFERENTIAL/PLATELET
Abs Immature Granulocytes: 0.05 10*3/uL (ref 0.00–0.07)
Basophils Absolute: 0 10*3/uL (ref 0.0–0.1)
Basophils Relative: 1 %
Eosinophils Absolute: 0.2 10*3/uL (ref 0.0–0.5)
Eosinophils Relative: 3 %
HCT: 39.2 % (ref 39.0–52.0)
Hemoglobin: 12.8 g/dL — ABNORMAL LOW (ref 13.0–17.0)
Immature Granulocytes: 1 %
Lymphocytes Relative: 9 %
Lymphs Abs: 0.7 10*3/uL (ref 0.7–4.0)
MCH: 32.4 pg (ref 26.0–34.0)
MCHC: 32.7 g/dL (ref 30.0–36.0)
MCV: 99.2 fL (ref 80.0–100.0)
Monocytes Absolute: 0.6 10*3/uL (ref 0.1–1.0)
Monocytes Relative: 8 %
Neutro Abs: 6.3 10*3/uL (ref 1.7–7.7)
Neutrophils Relative %: 78 %
Platelets: 164 10*3/uL (ref 150–400)
RBC: 3.95 MIL/uL — ABNORMAL LOW (ref 4.22–5.81)
RDW: 13.8 % (ref 11.5–15.5)
WBC: 7.9 10*3/uL (ref 4.0–10.5)
nRBC: 0 % (ref 0.0–0.2)

## 2022-09-25 LAB — MAGNESIUM: Magnesium: 2.3 mg/dL (ref 1.7–2.4)

## 2022-09-25 LAB — VITAMIN D 25 HYDROXY (VIT D DEFICIENCY, FRACTURES): Vit D, 25-Hydroxy: 55.68 ng/mL (ref 30–100)

## 2022-09-25 LAB — PSA: Prostatic Specific Antigen: 0.22 ng/mL (ref 0.00–4.00)

## 2022-09-26 ENCOUNTER — Other Ambulatory Visit (HOSPITAL_COMMUNITY): Payer: Self-pay | Admitting: Hematology

## 2022-09-26 ENCOUNTER — Other Ambulatory Visit (HOSPITAL_COMMUNITY): Payer: Self-pay

## 2022-09-26 MED ORDER — ABIRATERONE ACETATE 250 MG PO TABS
1000.0000 mg | ORAL_TABLET | Freq: Every day | ORAL | 2 refills | Status: DC
  Filled 2022-09-26 – 2022-10-03 (×2): qty 120, 30d supply, fill #0

## 2022-10-02 ENCOUNTER — Inpatient Hospital Stay (HOSPITAL_BASED_OUTPATIENT_CLINIC_OR_DEPARTMENT_OTHER): Payer: Medicare Other | Admitting: Hematology

## 2022-10-02 VITALS — BP 124/71 | HR 92 | Temp 97.4°F | Resp 16 | Ht 70.0 in | Wt 155.8 lb

## 2022-10-02 DIAGNOSIS — C775 Secondary and unspecified malignant neoplasm of intrapelvic lymph nodes: Secondary | ICD-10-CM

## 2022-10-02 DIAGNOSIS — C61 Malignant neoplasm of prostate: Secondary | ICD-10-CM | POA: Diagnosis not present

## 2022-10-02 NOTE — Progress Notes (Signed)
Patient is taking Zytiga as prescribed.  He has not missed any doses and reports no side effects at this time.

## 2022-10-02 NOTE — Progress Notes (Signed)
[place], [place] [address]   CLINIC:  Medical Oncology/Hematology  PCP:  [name], PA-C [address] 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 No pathogenic variants identified on the Common Hereditary Cancers+RNA panel. The report date is 06/23/2022.  The Common Hereditary Cancers Panel + RNA offered by [facility] includes sequencing and/or deletion duplication testing of the following 47 genes: APC, ATM, AXIN2, BARD1, BMPR1A, BRCA1, BRCA2, BRIP1, CDH1, CDKN2A (p14ARF), CDKN2A (p16INK4a), CKD4, CHEK2, CTNNA1, DICER1, EPCAM (Deletion/duplication testing only), GREM1 (promoter region deletion/duplication testing only), KIT, MEN1, MLH1, MSH2, MSH3, MSH6, MUTYH, NBN, NF1, NHTL1, PALB2, PDGFRA, PMS2, POLD1, POLE, PTEN, RAD50, RAD51C, RAD51D, SDHB, SDHC, SDHD, SMAD4, SMARCA4. STK11, TP53, TSC1, TSC2, and VHL.  The following genes were evaluated for sequence changes only: SDHA and HOXB13 c.251G>A variant only.     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 male, seen for follow-up of castrate resistant prostate cancer.  He is taking Abiraterone full dose at 4 tablets daily with prednisone 5 mg daily.  He reports complete drop in energy levels with fatigue.  He apparently fell 3 weeks ago when he became dizzy when he got out  of the car.  Did not have any serious injuries.  REVIEW OF SYSTEMS:  Review of Systems  Respiratory:  Positive for cough (From COPD).   Neurological:  Positive for dizziness.  Psychiatric/Behavioral:  Negative for depression. The patient is not nervous/anxious.   All other systems reviewed and are negative.   PAST MEDICAL/SURGICAL HISTORY:  Past Medical History:  Diagnosis Date   Abdominal aortic aneurysm (AAA) (HCC)    Anxiety    COPD (chronic obstructive pulmonary disease) (HCC)    Coronary artery calcification seen on CT scan    Essential hypertension    Lung cancer (HCC)    Non small cell carcinoma - XRT   Non-small cell carcinoma of left lung, stage 1 ([place]) 09/21/2017   Prostate cancer ([place])    XRT   Stroke ([place])    Type 2 diabetes mellitus ([place])    Past Surgical History:  Procedure Laterality Date   COLONOSCOPY N/A 11/23/2015   Procedure: COLONOSCOPY;  Surgeon: Aviva Signs, MD;  Location[facility];  Service: Gastroenterology;  Laterality: N/A;   FRACTURE SURGERY     PROSTATE BIOPSY     SINUS SURGERY WITH INSTATRAK      SOCIAL HISTORY:  Social History   Socioeconomic History   Marital status: Married    Spouse name: Not on file   Number of children: Not on file   Years of education: Not on file   Highest education level: Not on file  Occupational History   Not on file  Tobacco Use   Smoking status: Every Day    Packs/day: 2.00    Years: 44.00    Total pack years: 88.00  Types: Cigarettes   Smokeless tobacco: Never  Vaping Use   Vaping Use: Never used  Substance and Sexual Activity   Alcohol use: No    Alcohol/week: 0.0 standard drinks of alcohol   Drug use: No   Sexual activity: Not on file  Other Topics Concern   Not on file  Social History Narrative   Not on file   Social Determinants of Health   Financial Resource Strain: Not on file  Food Insecurity: Not on file  Transportation Needs: Not on file  Physical Activity: Not on file   Stress: Not on file  Social Connections: Not on file  Intimate Partner Violence: Not on file    FAMILY HISTORY:  Family History  Problem Relation Age of Onset   Heart disease Mother    Skin cancer Mother    Heart disease Father        before age 47   Skin cancer Brother    Breast cancer Maternal Aunt    Cancer Neg Hx     CURRENT MEDICATIONS:  Current Outpatient Medications  Medication Sig Dispense Refill   abiraterone acetate (ZYTIGA) 250 MG tablet Take 4 tablets (1,000 mg total) by mouth daily. Take on an empty stomach 1 hour before or 2 hours after a meal 120 tablet 2   alprazolam (XANAX) 2 MG tablet Take 2 mg by mouth 2 (two) times daily.     Blood Glucose Monitoring Suppl (ACCU-CHEK GUIDE ME) w/Device KIT 1 Piece by Does not apply route as directed. 1 kit 0   citalopram (CELEXA) 40 MG tablet Take 20-40 mg by mouth daily.     glipiZIDE (GLUCOTROL) 5 MG tablet Take 1 tablet (5 mg total) by mouth daily before breakfast. 90 tablet 1   glucose blood (ACCU-CHEK GUIDE) test strip Use to test glucose 4 times a day. 200 each 2   multivitamin (ONE-A-DAY MEN'S) TABS tablet Take 1 tablet by mouth daily.     predniSONE (DELTASONE) 5 MG tablet Take 1 tablet (5 mg total) by mouth daily with breakfast. 30 tablet 6   rosuvastatin (CRESTOR) 10 MG tablet Take 1 tablet (10 mg total) by mouth daily. 90 tablet 3   tamsulosin (FLOMAX) 0.4 MG CAPS capsule Take 1 capsule (0.4 mg total) by mouth in the morning and at bedtime. 60 capsule 11   No current facility-administered medications for this visit.    ALLERGIES:  Allergies  Allergen Reactions   Sulfa Antibiotics Hives, Itching and Swelling    Tongue swelling   Sulfasalazine Hives, Itching and Swelling    Tongue swelling   Lipitor [Atorvastatin] Other (See Comments)    myalgia   Simvastatin Other (See Comments)    myalgia   Iodinated Contrast Media Nausea And Vomiting    PHYSICAL EXAM:  Performance status (ECOG): 1 - Symptomatic but  completely ambulatory  Vitals:   10/02/22 1053  BP: 124/71  Pulse: 92  Resp: 16  Temp: (!) 97.4 F (36.3 C)  SpO2: 90%   Wt Readings from Last 3 Encounters:  10/02/22 155 lb 12.8 oz (70.7 kg)  09/11/22 156 lb (70.8 kg)  08/24/22 157 lb 1.6 oz (71.3 kg)   Physical Exam Vitals reviewed.  Constitutional:      Appearance: Normal appearance.  Cardiovascular:     Rate and Rhythm: Normal rate and regular rhythm.     Pulses: Normal pulses.     Heart sounds: Normal heart sounds.  Pulmonary:     Effort: Pulmonary effort is normal.  Breath sounds: Normal breath sounds.  Neurological:     General: No focal deficit present.     Mental Status: He is alert and oriented to person, place, and time.  Psychiatric:        Mood and Affect: Mood normal.        Behavior: Behavior normal.      LABORATORY DATA:  I have reviewed the labs as listed.     Latest Ref Rng & Units 09/25/2022   11:18 AM 08/24/2022   12:01 PM 08/02/2022   12:06 PM  CBC  WBC 4.0 - 10.5 K/uL 7.9  8.0  7.6   Hemoglobin 13.0 - 17.0 g/dL 12.8  12.4  12.0   Hematocrit 39.0 - 52.0 % 39.2  37.3  36.7   Platelets 150 - 400 K/uL 164  153  155       Latest Ref Rng & Units 09/25/2022   11:18 AM 08/24/2022   12:01 PM 08/02/2022   12:06 PM  CMP  Glucose 70 - 99 mg/dL 127  150  183   BUN 8 - 23 mg/dL '16  21  18   ' Creatinine 0.61 - 1.24 mg/dL 1.06  1.06  1.21   Sodium 135 - 145 mmol/L 138  137  136   Potassium 3.5 - 5.1 mmol/L 4.4  4.4  4.1   Chloride 98 - 111 mmol/L 106  102  104   CO2 22 - 32 mmol/L '26  27  24   ' Calcium 8.9 - 10.3 mg/dL 9.2  9.2  9.1   Total Protein 6.5 - 8.1 g/dL 7.5  7.6  7.2   Total Bilirubin 0.3 - 1.2 mg/dL 0.7  0.7  0.7   Alkaline Phos 38 - 126 U/L 91  87  79   AST 15 - 41 U/L '18  17  16   ' ALT 0 - 44 U/L '17  16  14     ' DIAGNOSTIC IMAGING:  I have independently reviewed the scans and discussed with the patient. No results found.   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 - Prostate IMRT from 12/13/2016 through 02/09/2017, 78 Gray in 40 fractions with ADT. - Lupron started back on 09/01/2020 for rising PSA levels. - PSMA PET scan (03/30/2022): Nodes in the retroperitoneum, right pelvis, left lower jugular/supraclavicular stations.  No bone metastasis.  No local recurrence. - Enzalutamide started on 04/03/2022, discontinued due to extreme fatigue, vision changes and jaw pain. - Apalutamide started on 05/01/2022, taking only 2 pills but experiences jaw pain and neck pain and vision changes.  Discontinued on 05/09/2022 due to poor tolerance.       - XRT with Dr. Tammi Klippel from 05/30/2022 through 06/30/2022.       -Abiraterone 1000 mg and prednisone 5 mg started on 07/06/2022    Social/family history: - Lives at home with his wife.  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 - Current active smoker, 2 packs/day for 55 years.  Rolls his own cigarettes. - Mother had skin cancers.  3.  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 - Definitive SBRT from 10/05/2017 through 10/12/2017, 54 Gray in 3 fractions of [place].   PLAN:  Metastatic CRPC to the lymph nodes: - He reports severe fatigue since last visit.  He also fell 3 weeks ago when he got dizzy when he was trying  to get out of the car.  I have reviewed his labs.  LFTs are normal.  Potassium was normal.  Blood pressure is 124/71.  PSA has improved to 0.22.  Because of severe fatigue and fall, I have recommended cutting back on the dose of Abiraterone to 750 mg daily.  We will also increase prednisone to 10 mg once daily.  Recommend follow-up in 2 months with repeat PSA and labs.    2.  Osteopenia (DEXA scan on 05/17/2022 T score -2.3): - We have discussed starting him on denosumab to decrease SRE.  He is not interested.         - Recommend taking calcium and vitamin D.   Orders  placed this encounter:  No orders of the defined types were placed in this encounter.     Derek Jack, MD [place] [phone]

## 2022-10-02 NOTE — Patient Instructions (Addendum)
Stephen Palmer at [facility] Discharge Instructions   You were seen and examined today by Dr. Delton Coombes.  He reviewed the results of your lab work which are normal/stable. Your PSA has improved to 0.22.   We will increase your prednisone to 10 mg daily to help improve your energy. Dr. Raliegh Ip will also decrease your Zytiga dose to 3 pills daily in the morning.   Continue Zytiga as prescribed.   Return as scheduled.    Thank you for choosing [place] at [facility] to provide your oncology and hematology care.  To afford each patient quality time with our provider, please arrive at least 15 minutes before your scheduled appointment time.   If you have a lab appointment with the [facility] please come in thru the Main Entrance and check in at the main information desk.  You need to re-schedule your appointment should you arrive 10 or more minutes late.  We strive to give you quality time with our providers, and arriving late affects you and other patients whose appointments are after yours.  Also, if you no show three or more times for appointments you may be dismissed from the clinic at the providers discretion.     Again, thank you for choosing [facility].  Our hope is that these requests will decrease the amount of time that you wait before being seen by our physicians.       _____________________________________________________________  Should you have questions after your visit to [facility], please contact our office at [phone] and follow the prompts.  Our office hours are 8:00 a.m. and 4:30 p.m. Monday - Friday.  Please note that voicemails left after 4:00 p.m. may not be returned until the following business day.  We are closed weekends and major holidays.  You do have access to a nurse 24-7, just call the main number to the clinic [phone] and do not press any options, hold on the line and a nurse will  answer the phone.    For prescription refill requests, have your pharmacy contact our office and allow 72 hours.    Due to Covid, you will need to wear a mask upon entering the hospital. If you do not have a mask, a mask will be given to you at the Main Entrance upon arrival. For doctor visits, patients may have 1 support person age 71 or older with them. For treatment visits, patients can not have anyone with them due to social distancing guidelines and our immunocompromised population.

## 2022-10-03 ENCOUNTER — Other Ambulatory Visit: Payer: Self-pay

## 2022-10-03 ENCOUNTER — Other Ambulatory Visit (HOSPITAL_COMMUNITY): Payer: Self-pay

## 2022-10-03 MED ORDER — PREDNISONE 10 MG PO TABS: 10.0000 mg | ORAL_TABLET | Freq: Every day | ORAL | 6 refills | Status: DC

## 2022-10-20 ENCOUNTER — Other Ambulatory Visit (HOSPITAL_COMMUNITY): Payer: Self-pay

## 2022-10-20 ENCOUNTER — Other Ambulatory Visit: Payer: Self-pay

## 2022-10-24 ENCOUNTER — Other Ambulatory Visit (HOSPITAL_COMMUNITY): Payer: Self-pay

## 2022-10-31 ENCOUNTER — Other Ambulatory Visit (HOSPITAL_COMMUNITY): Payer: Self-pay | Admitting: Hematology

## 2022-10-31 ENCOUNTER — Other Ambulatory Visit (HOSPITAL_COMMUNITY): Payer: Self-pay

## 2022-10-31 DIAGNOSIS — C61 Malignant neoplasm of prostate: Secondary | ICD-10-CM

## 2022-10-31 MED ORDER — ABIRATERONE ACETATE 250 MG PO TABS
750.0000 mg | ORAL_TABLET | Freq: Every day | ORAL | 2 refills | Status: DC
  Filled 2022-10-31 – 2022-11-07 (×4): qty 90, 30d supply, fill #0

## 2022-10-31 MED ORDER — ABIRATERONE ACETATE 250 MG PO TABS
1000.0000 mg | ORAL_TABLET | Freq: Every day | ORAL | 2 refills | Status: DC
  Filled 2022-10-31: qty 120, 30d supply, fill #0

## 2022-10-31 NOTE — Addendum Note (Signed)
Addended by: Darl Pikes on: 10/31/2022 10:13 AM   Modules accepted: Orders

## 2022-10-31 NOTE — Telephone Encounter (Signed)
MD dose decreased patient to 750mg  daily of abiraterone at last office visit, Rx updated to reflect this change andresent to [facility] (Specialty).

## 2022-11-01 ENCOUNTER — Other Ambulatory Visit (HOSPITAL_COMMUNITY): Payer: Self-pay

## 2022-11-02 ENCOUNTER — Other Ambulatory Visit (HOSPITAL_COMMUNITY): Payer: Self-pay

## 2022-11-07 ENCOUNTER — Other Ambulatory Visit (HOSPITAL_COMMUNITY): Payer: Self-pay

## 2022-11-21 ENCOUNTER — Telehealth: Payer: Self-pay

## 2022-11-21 MED ORDER — ALFUZOSIN HCL ER 10 MG PO TB24: 10.0000 mg | ORAL_TABLET | Freq: Every day | ORAL | 11 refills | Status: DC

## 2022-11-21 NOTE — Telephone Encounter (Signed)
Patient is requesting a different rx, he states due to his sulfa allergy he is unable to take flomax.  Verbal from Dr. Alyson Ingles to send in uroxatral.  Rx sent and patient aware via voicemail.

## 2022-11-22 ENCOUNTER — Other Ambulatory Visit (HOSPITAL_COMMUNITY): Payer: Self-pay

## 2022-11-30 ENCOUNTER — Other Ambulatory Visit (HOSPITAL_COMMUNITY): Payer: Self-pay

## 2022-12-04 ENCOUNTER — Inpatient Hospital Stay: Payer: Medicare Other | Attending: Genetics

## 2022-12-04 DIAGNOSIS — Z8546 Personal history of malignant neoplasm of prostate: Secondary | ICD-10-CM | POA: Diagnosis not present

## 2022-12-04 DIAGNOSIS — M8580 Other specified disorders of bone density and structure, unspecified site: Secondary | ICD-10-CM | POA: Insufficient documentation

## 2022-12-04 DIAGNOSIS — Z923 Personal history of irradiation: Secondary | ICD-10-CM | POA: Insufficient documentation

## 2022-12-04 DIAGNOSIS — Z79899 Other long term (current) drug therapy: Secondary | ICD-10-CM | POA: Insufficient documentation

## 2022-12-04 DIAGNOSIS — C61 Malignant neoplasm of prostate: Secondary | ICD-10-CM

## 2022-12-04 LAB — CBC WITH DIFFERENTIAL/PLATELET
Abs Immature Granulocytes: 0.05 10*3/uL (ref 0.00–0.07)
Basophils Absolute: 0 10*3/uL (ref 0.0–0.1)
Basophils Relative: 0 %
Eosinophils Absolute: 0.2 10*3/uL (ref 0.0–0.5)
Eosinophils Relative: 2 %
HCT: 41.6 % (ref 39.0–52.0)
Hemoglobin: 13.7 g/dL (ref 13.0–17.0)
Immature Granulocytes: 1 %
Lymphocytes Relative: 12 %
Lymphs Abs: 1 10*3/uL (ref 0.7–4.0)
MCH: 32.2 pg (ref 26.0–34.0)
MCHC: 32.9 g/dL (ref 30.0–36.0)
MCV: 97.9 fL (ref 80.0–100.0)
Monocytes Absolute: 0.6 10*3/uL (ref 0.1–1.0)
Monocytes Relative: 8 %
Neutro Abs: 6.2 10*3/uL (ref 1.7–7.7)
Neutrophils Relative %: 77 %
Platelets: 193 10*3/uL (ref 150–400)
RBC: 4.25 MIL/uL (ref 4.22–5.81)
RDW: 13.4 % (ref 11.5–15.5)
WBC: 8 10*3/uL (ref 4.0–10.5)
nRBC: 0 % (ref 0.0–0.2)

## 2022-12-04 LAB — COMPREHENSIVE METABOLIC PANEL
ALT: 19 U/L (ref 0–44)
AST: 19 U/L (ref 15–41)
Albumin: 4.1 g/dL (ref 3.5–5.0)
Alkaline Phosphatase: 74 U/L (ref 38–126)
Anion gap: 7 (ref 5–15)
BUN: 16 mg/dL (ref 8–23)
CO2: 28 mmol/L (ref 22–32)
Calcium: 9.3 mg/dL (ref 8.9–10.3)
Chloride: 103 mmol/L (ref 98–111)
Creatinine, Ser: 1.11 mg/dL (ref 0.61–1.24)
GFR, Estimated: >60 mL/min (ref >60)
Glucose, Bld: 103 mg/dL — ABNORMAL HIGH (ref 70–99)
Potassium: 4.3 mmol/L (ref 3.5–5.1)
Sodium: 138 mmol/L (ref 135–145)
Total Bilirubin: 0.4 mg/dL (ref 0.3–1.2)
Total Protein: 7.4 g/dL (ref 6.5–8.1)

## 2022-12-04 LAB — PSA: Prostatic Specific Antigen: 0.19 ng/mL (ref 0.00–4.00)

## 2022-12-04 LAB — MAGNESIUM: Magnesium: 2.1 mg/dL (ref 1.7–2.4)

## 2022-12-12 ENCOUNTER — Inpatient Hospital Stay (HOSPITAL_BASED_OUTPATIENT_CLINIC_OR_DEPARTMENT_OTHER): Payer: Medicare Other | Admitting: Hematology

## 2022-12-12 VITALS — BP 126/77 | HR 95 | Temp 98.7°F | Resp 94 | Wt 155.4 lb

## 2022-12-12 DIAGNOSIS — Z923 Personal history of irradiation: Secondary | ICD-10-CM | POA: Diagnosis not present

## 2022-12-12 DIAGNOSIS — C775 Secondary and unspecified malignant neoplasm of intrapelvic lymph nodes: Secondary | ICD-10-CM | POA: Diagnosis not present

## 2022-12-12 DIAGNOSIS — Z79899 Other long term (current) drug therapy: Secondary | ICD-10-CM | POA: Diagnosis not present

## 2022-12-12 DIAGNOSIS — C61 Malignant neoplasm of prostate: Secondary | ICD-10-CM

## 2022-12-12 DIAGNOSIS — M8580 Other specified disorders of bone density and structure, unspecified site: Secondary | ICD-10-CM | POA: Diagnosis not present

## 2022-12-12 DIAGNOSIS — Z8546 Personal history of malignant neoplasm of prostate: Secondary | ICD-10-CM | POA: Diagnosis not present

## 2022-12-12 NOTE — Progress Notes (Signed)
[place], [place] [address]   CLINIC:  Medical Oncology/Hematology  PCP:  [name], PA-C [address] 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 No pathogenic variants identified on the Common Hereditary Cancers+RNA panel. The report date is 06/23/2022.  The Common Hereditary Cancers Panel + RNA offered by [facility] includes sequencing and/or deletion duplication testing of the following 47 genes: APC, ATM, AXIN2, BARD1, BMPR1A, BRCA1, BRCA2, BRIP1, CDH1, CDKN2A (p14ARF), CDKN2A (p16INK4a), CKD4, CHEK2, CTNNA1, DICER1, EPCAM (Deletion/duplication testing only), GREM1 (promoter region deletion/duplication testing only), KIT, MEN1, MLH1, MSH2, MSH3, MSH6, MUTYH, NBN, NF1, NHTL1, PALB2, PDGFRA, PMS2, POLD1, POLE, PTEN, RAD50, RAD51C, RAD51D, SDHB, SDHC, SDHD, SMAD4, SMARCA4. STK11, TP53, TSC1, TSC2, and VHL.  The following genes were evaluated for sequence changes only: SDHA and HOXB13 c.251G>A variant only.     CANCER STAGING:  Cancer Staging  Prostate cancer metastatic to intrapelvic lymph node ([place]) Staging form: Prostate, AJCC 7th Edition - Clinical: Stage IV (T1c, N1, M1a, PSA: Less than 10, Gleason 8-10) - Unsigned   INTERVAL HISTORY:  [name], a 71 y.o. male, seen for follow-up of castration resistant prostate cancer.  He is taking Abiraterone 3 tablets daily with prednisone daily.  He reports that he is taking Abiraterone in the mornings.  He reports no energy levels today.  His energy levels have not improved since we  cut back on the dose.  He is continuing to feel tired and sleepy all the time.  Back pain is 4 out of 10 and stable.  REVIEW OF SYSTEMS:  Review of Systems  Respiratory:  Positive for cough (From COPD stable).   Musculoskeletal:  Positive for back pain.  Psychiatric/Behavioral:  Negative for depression. The patient is not nervous/anxious.   All other systems reviewed and are negative.   PAST MEDICAL/SURGICAL HISTORY:  Past Medical History:  Diagnosis Date   Abdominal aortic aneurysm (AAA) (HCC)    Anxiety    COPD (chronic obstructive pulmonary disease) (HCC)    Coronary artery calcification seen on CT scan    Essential hypertension    Lung cancer (HCC)    Non small cell carcinoma - XRT   Non-small cell carcinoma of left lung, stage 1 ([place]) 09/21/2017   Prostate cancer ([place])    XRT   Stroke ([place])    Type 2 diabetes mellitus ([place])    Past Surgical History:  Procedure Laterality Date   COLONOSCOPY N/A 11/23/2015   Procedure: COLONOSCOPY;  Surgeon: Aviva Signs, MD;  [facility];  Service: Gastroenterology;  Laterality: N/A;   FRACTURE SURGERY     PROSTATE BIOPSY     SINUS SURGERY WITH INSTATRAK      SOCIAL HISTORY:  Social History   Socioeconomic History   Marital status: Married    Spouse name: Not on file   Number of children: Not on file   Years of education: Not on file   Highest education level: Not on file  Occupational History   Not on file  Tobacco Use   Smoking status: Every Day  Packs/day: 2.00    Years: 44.00    Total pack years: 88.00    Types: Cigarettes   Smokeless tobacco: Never  Vaping Use   Vaping Use: Never used  Substance and Sexual Activity   Alcohol use: No    Alcohol/week: 0.0 standard drinks of alcohol   Drug use: No   Sexual activity: Not on file  Other Topics Concern   Not on file  Social History Narrative   Not on file   Social Determinants of Health   Financial Resource Strain: Not on file  Food Insecurity: Not on  file  Transportation Needs: Not on file  Physical Activity: Not on file  Stress: Not on file  Social Connections: Not on file  Intimate Partner Violence: Not on file    FAMILY HISTORY:  Family History  Problem Relation Age of Onset   Heart disease Mother    Skin cancer Mother    Heart disease Father        before age 34   Skin cancer Brother    Breast cancer Maternal Aunt    Cancer Neg Hx     CURRENT MEDICATIONS:  Current Outpatient Medications  Medication Sig Dispense Refill   abiraterone acetate (ZYTIGA) 250 MG tablet Take 3 tablets (750 mg total) by mouth daily. Take on an empty stomach 1 hour before or 2 hours after a meal 90 tablet 2   alfuzosin (UROXATRAL) 10 MG 24 hr tablet Take 1 tablet (10 mg total) by mouth daily with breakfast. 30 tablet 11   alprazolam (XANAX) 2 MG tablet Take 2 mg by mouth 2 (two) times daily.     Blood Glucose Monitoring Suppl (ACCU-CHEK GUIDE ME) w/Device KIT 1 Piece by Does not apply route as directed. 1 kit 0   citalopram (CELEXA) 40 MG tablet Take 20-40 mg by mouth daily.     glipiZIDE (GLUCOTROL) 5 MG tablet Take 1 tablet (5 mg total) by mouth daily before breakfast. 90 tablet 1   glucose blood (ACCU-CHEK GUIDE) test strip Use to test glucose 4 times a day. 200 each 2   multivitamin (ONE-A-DAY MEN'S) TABS tablet Take 1 tablet by mouth daily.     predniSONE (DELTASONE) 10 MG tablet Take 1 tablet (10 mg total) by mouth daily with breakfast. 30 tablet 6   rosuvastatin (CRESTOR) 10 MG tablet Take 1 tablet (10 mg total) by mouth daily. 90 tablet 3   No current facility-administered medications for this visit.    ALLERGIES:  Allergies  Allergen Reactions   Sulfa Antibiotics Hives, Itching and Swelling    Tongue swelling   Sulfasalazine Hives, Itching and Swelling    Tongue swelling   Lipitor [Atorvastatin] Other (See Comments)    myalgia   Simvastatin Other (See Comments)    myalgia   Iodinated Contrast Media Nausea And Vomiting     PHYSICAL EXAM:  Performance status (ECOG): 1 - Symptomatic but completely ambulatory  Vitals:   12/12/22 1418  BP: 126/77  Pulse: 95  Resp: (!) 94  Temp: 98.7 F (37.1 C)  SpO2: 94%   Wt Readings from Last 3 Encounters:  12/12/22 155 lb 6.8 oz (70.5 kg)  10/02/22 155 lb 12.8 oz (70.7 kg)  09/11/22 156 lb (70.8 kg)   Physical Exam Vitals reviewed.  Constitutional:      Appearance: Normal appearance.  Cardiovascular:     Rate and Rhythm: Normal rate and regular rhythm.     Pulses: Normal pulses.     Heart  sounds: Normal heart sounds.  Pulmonary:     Effort: Pulmonary effort is normal.     Breath sounds: Normal breath sounds.  Neurological:     General: No focal deficit present.     Mental Status: He is alert and oriented to person, place, and time.  Psychiatric:        Mood and Affect: Mood normal.        Behavior: Behavior normal.      LABORATORY DATA:  I have reviewed the labs as listed.     Latest Ref Rng & Units 12/04/2022    9:56 AM 09/25/2022   11:18 AM 08/24/2022   12:01 PM  CBC  WBC 4.0 - 10.5 K/uL 8.0  7.9  8.0   Hemoglobin 13.0 - 17.0 g/dL 13.7  12.8  12.4   Hematocrit 39.0 - 52.0 % 41.6  39.2  37.3   Platelets 150 - 400 K/uL 193  164  153       Latest Ref Rng & Units 12/04/2022    9:56 AM 09/25/2022   11:18 AM 08/24/2022   12:01 PM  CMP  Glucose 70 - 99 mg/dL 103  127  150   BUN 8 - 23 mg/dL _0 Creatinine 0.61 - 1.24 mg/dL 1.11  1.06  1.06   Sodium 135 - 145 mmol/L 138  138  137   Potassium 3.5 - 5.1 mmol/L 4.3  4.4  4.4   Chloride 98 - 111 mmol/L 103  106  102   CO2 22 - 32 mmol/L _1 Calcium 8.9 - 10.3 mg/dL 9.3  9.2  9.2   Total Protein 6.5 - 8.1 g/dL 7.4  7.5  7.6   Total Bilirubin 0.3 - 1.2 mg/dL 0.4  0.7  0.7   Alkaline Phos 38 - 126 U/L 74  91  87   AST 15 - 41 U/L _2 ALT 0 - 44 U/L _3 DIAGNOSTIC IMAGING:  I have independently reviewed the scans and discussed with the patient. No results  found.   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 - Prostate IMRT from 12/13/2016 through 02/09/2017, 78 Gray in 40 fractions with ADT. - Lupron started back on 09/01/2020 for rising PSA levels. - PSMA PET scan (03/30/2022): Nodes in the retroperitoneum, right pelvis, left lower jugular/supraclavicular stations.  No bone metastasis.  No local recurrence. - Enzalutamide started on 04/03/2022, discontinued due to extreme fatigue, vision changes and jaw pain. - Apalutamide started on 05/01/2022, taking only 2 pills but experiences jaw pain and neck pain and vision changes.  Discontinued on 05/09/2022 due to poor tolerance.       - XRT with Dr. Tammi Klippel from 05/30/2022 through 06/30/2022.       -Abiraterone 1000 mg and prednisone 5 mg started on 07/06/2022    Social/family history: - Lives at home with his wife.  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 - Current active smoker, 2 packs/day for 55 years.  Rolls his own cigarettes. - Mother had skin cancers.  3.  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 - Definitive SBRT from 10/05/2017 through 10/12/2017, 54 Gray in 3 fractions of [place].   PLAN:  Metastatic CRPC to the lymph nodes: -Abiraterone dose  was decreased to 750 mg daily on 10/02/2022 due to severe fatigue. - He continues to feel tired and sleepy all the time.  He did not have any falls since last visit. - Reviewed his labs today which showed normal LFTs and creatinine.  CBC was normal. - PSA 0.19, down from 0.22. - As his tiredness is affecting quality of life, I have recommended decreasing Abiraterone to 500 mg daily and continue prednisone 10 mg daily. - Recommend follow-up in 3 months with repeat PSA levels.    2.  Osteopenia (DEXA scan on 05/17/2022 T score -2.3): - We have discussed starting on denosumab to decrease  SRE.  He is not interested at this time. - Continue calcium and vitamin D supplements.   Orders placed this encounter:  Orders Placed This Encounter  Procedures   PSA   CBC with Differential/Platelet   Comprehensive metabolic panel   Magnesium       Derek Jack, MD [place] [phone]

## 2022-12-12 NOTE — Patient Instructions (Addendum)
[place]  Discharge Instructions  You were seen and examined today by Dr. Delton Coombes.  Dr. Delton Coombes discussed your most recent lab work which revealed that everything looks good.  Decrease Zytiga to 500 mg so start taking two tablets once daily.   Follow-up as scheduled in 3 months with labs.   Thank you for choosing [facility] to provide your oncology and hematology care.   To afford each patient quality time with our provider, please arrive at least 15 minutes before your scheduled appointment time. You may need to reschedule your appointment if you arrive late (10 or more minutes). Arriving late affects you and other patients whose appointments are after yours.  Also, if you miss three or more appointments without notifying the office, you may be dismissed from the clinic at the provider's discretion.    Again, thank you for choosing [facility].  Our hope is that these requests will decrease the amount of time that you wait before being seen by our physicians.   If you have a lab appointment with the [facility] please come in thru the Main Entrance and check in at the main information desk.           _____________________________________________________________  Should you have questions after your visit to [facility], please contact our office at [phone] and follow the prompts.  Our office hours are 8:00 a.m. to 4:30 p.m. Monday - Thursday and 8:00 a.m. to 2:30 p.m. Friday.  Please note that voicemails left after 4:00 p.m. may not be returned until the following business day.  We are closed weekends and all major holidays.  You do have access to a nurse 24-7, just call the main number to the clinic [phone] and do not press any options, hold on the line and a nurse will answer the phone.    For prescription refill requests, have your pharmacy contact our office and allow 72 hours.     Masks are optional in the cancer centers. If you would like for your care team to wear a mask while they are taking care of you, please let them know. You may have one support person who is at least 71 years old accompany you for your appointments.

## 2022-12-19 ENCOUNTER — Other Ambulatory Visit (HOSPITAL_COMMUNITY): Payer: Self-pay

## 2022-12-19 ENCOUNTER — Other Ambulatory Visit (HOSPITAL_COMMUNITY): Payer: Self-pay | Admitting: Hematology

## 2022-12-19 ENCOUNTER — Other Ambulatory Visit: Payer: Self-pay

## 2022-12-19 DIAGNOSIS — C61 Malignant neoplasm of prostate: Secondary | ICD-10-CM

## 2022-12-19 MED ORDER — ABIRATERONE ACETATE 250 MG PO TABS
500.0000 mg | ORAL_TABLET | Freq: Every day | ORAL | 2 refills | Status: DC
  Filled 2022-12-19: qty 60, 30d supply, fill #0
  Filled 2023-01-19: qty 60, 30d supply, fill #1
  Filled 2023-02-12 (×2): qty 60, 30d supply, fill #2

## 2022-12-22 ENCOUNTER — Other Ambulatory Visit (HOSPITAL_COMMUNITY): Payer: Self-pay

## 2022-12-27 ENCOUNTER — Telehealth: Payer: Self-pay

## 2022-12-27 ENCOUNTER — Other Ambulatory Visit (HOSPITAL_COMMUNITY): Payer: Self-pay

## 2022-12-27 NOTE — Telephone Encounter (Signed)
Oral Oncology Patient Advocate Encounter   Was successful in securing patient a $5,500 grant from [employer] (PAF) to provide copayment coverage for Abiraterone.  This will keep the out of pocket expense at $0.     I have spoken with the patient.    The billing information is as follows and has been shared with [place].   RxBin: [id number] PCN:  [id number] Member ID: [id number] Group ID: [id number] Dates of Eligibility: 06/24/22 through 12/21/23  Berdine Addison, [facility] [place]  [phone] (phone) [phone] (fax) 12/27/2022 2:48 PM

## 2022-12-27 NOTE — Telephone Encounter (Signed)
Left VM for patient to call back to inform them of grant.

## 2023-01-09 DIAGNOSIS — F419 Anxiety disorder, unspecified: Secondary | ICD-10-CM | POA: Diagnosis not present

## 2023-01-10 ENCOUNTER — Encounter: Payer: Self-pay | Admitting: Urology

## 2023-01-10 ENCOUNTER — Other Ambulatory Visit (HOSPITAL_COMMUNITY): Payer: Self-pay

## 2023-01-11 ENCOUNTER — Other Ambulatory Visit (HOSPITAL_COMMUNITY): Payer: Self-pay

## 2023-01-19 ENCOUNTER — Other Ambulatory Visit: Payer: Self-pay

## 2023-01-19 ENCOUNTER — Other Ambulatory Visit (HOSPITAL_COMMUNITY): Payer: Self-pay

## 2023-01-22 ENCOUNTER — Other Ambulatory Visit: Payer: Self-pay

## 2023-02-07 ENCOUNTER — Encounter: Payer: Self-pay | Admitting: Nurse Practitioner

## 2023-02-07 ENCOUNTER — Ambulatory Visit: Payer: Medicare Other | Attending: Nurse Practitioner | Admitting: Nurse Practitioner

## 2023-02-07 VITALS — BP 107/65 | HR 86 | Ht 70.0 in | Wt 154.2 lb

## 2023-02-07 DIAGNOSIS — I714 Abdominal aortic aneurysm, without rupture, unspecified: Secondary | ICD-10-CM | POA: Insufficient documentation

## 2023-02-07 DIAGNOSIS — E782 Mixed hyperlipidemia: Secondary | ICD-10-CM

## 2023-02-07 DIAGNOSIS — I2510 Atherosclerotic heart disease of native coronary artery without angina pectoris: Secondary | ICD-10-CM | POA: Insufficient documentation

## 2023-02-07 DIAGNOSIS — J449 Chronic obstructive pulmonary disease, unspecified: Secondary | ICD-10-CM | POA: Diagnosis not present

## 2023-02-07 DIAGNOSIS — Z720 Tobacco use: Secondary | ICD-10-CM | POA: Diagnosis not present

## 2023-02-07 DIAGNOSIS — I10 Essential (primary) hypertension: Secondary | ICD-10-CM | POA: Diagnosis not present

## 2023-02-07 DIAGNOSIS — E785 Hyperlipidemia, unspecified: Secondary | ICD-10-CM | POA: Diagnosis not present

## 2023-02-07 NOTE — Progress Notes (Signed)
Cardiology Office Note:    Date:  02/07/2023  ID:  Stephen Palmer., DOB Jul 06, 1951, MRN [id number]  PCP:  Palmer, Stephen J, [place] Providers Cardiologist:  Stephen Lesches, MD     Referring MD: Stephen Palmer, [place]*   Chief Complaint  Patient presents with   Follow-up    History of Present Illness:    Stephen Palmer. is a 72 y.o. male with a hx of   Coronary artery calcification seen on CT scan AAA (follows Dr. Wilhelmenia Blase at [facility]) COPD HTN Hx of stroke Type 2 DM Lung Cancer Prostate cancer Tobacco abuse  Patient is a very pleasant 72 y.o. male with past medical history as mentioned above.   Last seen by Dr. Domenic Polite on 12/22/2021. Was doing well at the time. No medication changes were made. Was told to follow-up in 1 year.   Today he presents for 1 year follow-up. Doing well. Denies any chest pain, shortness of breath, palpitations, syncope, presyncope, dizziness, orthopnea, PND, swelling or significant weight changes, acute bleeding, or claudication. Continues to smoke.   Past Medical History:  Diagnosis Date   Abdominal aortic aneurysm (AAA) (HCC)    Anxiety    COPD (chronic obstructive pulmonary disease) (HCC)    Coronary artery calcification seen on CT scan    Essential hypertension    Lung cancer (HCC)    Non small cell carcinoma - XRT   Non-small cell carcinoma of left lung, stage 1 ([place]) 09/21/2017   Prostate cancer ([place])    XRT   Stroke ([place])    Type 2 diabetes mellitus ([place])     Past Surgical History:  Procedure Laterality Date   COLONOSCOPY N/A 11/23/2015   Procedure: COLONOSCOPY;  Surgeon: Aviva Signs, MD;  Location[facility];  Service: Gastroenterology;  Laterality: N/A;   FRACTURE SURGERY     PROSTATE BIOPSY     SINUS SURGERY WITH INSTATRAK      Current Medications: Current Meds  Medication Sig   abiraterone acetate (ZYTIGA) 250 MG tablet Take 2 tablets (500 mg total) by mouth daily. Take on an  empty stomach 1 hour before or 2 hours after a meal   alfuzosin (UROXATRAL) 10 MG 24 hr tablet Take 1 tablet (10 mg total) by mouth daily with breakfast.   alprazolam (XANAX) 2 MG tablet Take 2 mg by mouth 2 (two) times daily.   Blood Glucose Monitoring Suppl (ACCU-CHEK GUIDE ME) w/Device KIT 1 Piece by Does not apply route as directed.   citalopram (CELEXA) 40 MG tablet Take 20-40 mg by mouth daily.   glipiZIDE (GLUCOTROL) 5 MG tablet Take 1 tablet (5 mg total) by mouth daily before breakfast.   glucose blood (ACCU-CHEK GUIDE) test strip Use to test glucose 4 times a day.   multivitamin (ONE-A-DAY MEN'S) TABS tablet Take 1 tablet by mouth daily.   predniSONE (DELTASONE) 10 MG tablet Take 1 tablet (10 mg total) by mouth daily with breakfast.   rosuvastatin (CRESTOR) 10 MG tablet Take 1 tablet (10 mg total) by mouth daily.     Allergies:   Sulfa antibiotics, Sulfasalazine, Lipitor [atorvastatin], Simvastatin, and Iodinated contrast media   Social History   Socioeconomic History   Marital status: Married    Spouse name: Not on file   Number of children: Not on file   Years of education: Not on file   Highest education level: Not on file  Occupational History   Not on  file  Tobacco Use   Smoking status: Every Day    Packs/day: 2.00    Years: 44.00    Total pack years: 88.00    Types: Cigarettes   Smokeless tobacco: Never  Vaping Use   Vaping Use: Never used  Substance and Sexual Activity   Alcohol use: No    Alcohol/week: 0.0 standard drinks of alcohol   Drug use: No   Sexual activity: Not on file  Other Topics Concern   Not on file  Social History Narrative   Not on file   Social Determinants of Health   Financial Resource Strain: Not on file  Food Insecurity: Not on file  Transportation Needs: Not on file  Physical Activity: Not on file  Stress: Not on file  Social Connections: Not on file     Family History: The patient's family history includes Breast cancer in  his maternal aunt; Heart disease in his father and mother; Skin cancer in his brother and mother. There is no history of Cancer.  ROS:   Please see the history of present illness.     All other systems reviewed and are negative.  EKGs/Labs/Other Studies Reviewed:    The following studies were reviewed today:   EKG:  EKG is  ordered today.  The ekg ordered today demonstrates NSR, 86 bpm, no acute ischemic changes.   AAA duplex limited on 12/01/2020: Summary:  Abdominal Aorta: There is evidence of abnormal dilatation of the mid and  distal Abdominal aorta. Previous diameter measurement was 5.3 x 5.1 cm obtained on 04/12/20 by CTA.  Myoview on 08/22/2018: Normal perfusion. No significant ischemia or scar This is a low risk study. Nuclear stress EF: 70%.    Recent Labs: 12/04/2022: ALT 19; BUN 16; Creatinine, Ser 1.11; Hemoglobin 13.7; Magnesium 2.1; Platelets 193; Potassium 4.3; Sodium 138  Recent Lipid Panel    Component Value Date/Time   CHOL 151 11/29/2021 1501   TRIG 107 11/29/2021 1501   HDL 48 11/29/2021 1501   CHOLHDL 3.1 11/29/2021 1501   LDLCALC 83 11/29/2021 1501     Risk Assessment/Calculations:    The 10-year ASCVD risk score (Arnett DK, et al., 2019) is: 27.5%   Values used to calculate the score:     Age: 25 years     Sex: Male     Is Non-Hispanic African American: No     Diabetic: Yes     Tobacco smoker: Yes     Systolic Blood Pressure: [id number] mmHg     Is BP treated: No     HDL Cholesterol: 48 mg/dL     Total Cholesterol: 151 mg/dL       Physical Exam:    VS:  BP 107/65   Pulse 86   Ht '5\' 10"'$  (1.778 m)   Wt 154 lb 3.2 oz (69.9 kg)   SpO2 93%   BMI 22.13 kg/m     Wt Readings from Last 3 Encounters:  02/07/23 154 lb 3.2 oz (69.9 kg)  12/12/22 155 lb 6.8 oz (70.5 kg)  10/02/22 155 lb 12.8 oz (70.7 kg)     GEN:  Well nourished, well developed in no acute distress HEENT: Normal NECK: No JVD; No carotid bruits CARDIAC: S1/S2, RRR, no murmurs,  rubs, gallops RESPIRATORY:  Clear to auscultation without rales, wheezing or rhonchi  MUSCULOSKELETAL:  No edema; No deformity  SKIN: Warm and dry NEUROLOGIC:  Alert and oriented x 3 PSYCHIATRIC:  Normal affect   ASSESSMENT:    1.  Coronary artery calcification seen on CT scan   2. Abdominal aortic aneurysm (AAA) without rupture, unspecified part ([place])   3. Essential hypertension, benign   4. Hyperlipidemia, unspecified hyperlipidemia type   5. Chronic obstructive pulmonary disease, unspecified COPD type ([place])   6. Tobacco abuse    PLAN:    In order of problems listed above:  Coronary artery calcification seen on CT scan Stable with no anginal symptoms. No indication for ischemic evaluation. Continue Crestor. Heart healthy diet and regular cardiovascular exercise encouraged.   2. AAA Denies any concerning signs of symptoms. Closely followed by Dr. Wilhelmenia Blase at [facility]. Care precautions and ED precautions discussed. Heart healthy diet and regular cardiovascular exercise encouraged.   3. HTN BP stable. No medication changes. Discussed to monitor BP at home at least 2 hours after medications and sitting for 5-10 minutes. Heart healthy diet and regular cardiovascular exercise encouraged.   4. HLD LDL 83 1 year ago. Will obtain FLP. Continue rosuvastatin. Heart healthy diet and regular cardiovascular exercise encouraged.   5. COPD, tobacco abuse Denies any acute exacerbations or symptoms. Continue to follow with PCP. Smoking cessation encouraged and discussed. States he is not interested in quitting smoking at this time.   6. Disposition: Follow-up with Dr. Domenic Polite in 1 year or sooner if anything changes.       Medication Adjustments/Labs and Tests Ordered: Current medicines are reviewed at length with the patient today.  Concerns regarding medicines are outlined above.  Orders Placed This Encounter  Procedures   Lipid panel   EKG 12-Lead   No orders of the defined types  were placed in this encounter.   Patient Instructions  Medication Instructions:   Your physician recommends that you continue on your current medications as directed. Please refer to the Current Medication list given to you today.  *If you need a refill on your cardiac medications before your next appointment, please call your pharmacy*  Lab Work: Your physician recommends that you return for lab work in 1 month:  Fasting Lipid Panel-DO NOT eat or drink past midnight. Okay to have water and/or black coffee to drink the morning of labs  If you have labs (blood work) drawn today and your tests are completely normal, you will receive your results only by: MyChart Message (if you have MyChart) OR A paper copy in the mail If you have any lab test that is abnormal or we need to change your treatment, we will call you to review the results.   Testing/Procedures: NONE ordered at this time of appointment   Follow-Up: At [facility], you and your health needs are our priority.  As part of our continuing mission to provide you with exceptional heart care, we have created designated Provider Care Teams.  These Care Teams include your primary Cardiologist (physician) and Advanced Practice Providers (APPs -  Physician Assistants and Nurse Practitioners) who all work together to provide you with the care you need, when you need it.   Your next appointment:   1 year(s)  Provider:   Rozann Lesches, MD    Other Instructions    Signed, Stephen Bud, NP  02/11/2023 5:57 PM    [place]

## 2023-02-07 NOTE — Patient Instructions (Signed)
Medication Instructions:   Your physician recommends that you continue on your current medications as directed. Please refer to the Current Medication list given to you today.  *If you need a refill on your cardiac medications before your next appointment, please call your pharmacy*  Lab Work: Your physician recommends that you return for lab work in 1 month:  Fasting Lipid Panel-DO NOT eat or drink past midnight. Okay to have water and/or black coffee to drink the morning of labs  If you have labs (blood work) drawn today and your tests are completely normal, you will receive your results only by: MyChart Message (if you have MyChart) OR A paper copy in the mail If you have any lab test that is abnormal or we need to change your treatment, we will call you to review the results.   Testing/Procedures: NONE ordered at this time of appointment   Follow-Up: At [facility], you and your health needs are our priority.  As part of our continuing mission to provide you with exceptional heart care, we have created designated Provider Care Teams.  These Care Teams include your primary Cardiologist (physician) and Advanced Practice Providers (APPs -  Physician Assistants and Nurse Practitioners) who all work together to provide you with the care you need, when you need it.   Your next appointment:   1 year(s)  Provider:   Rozann Lesches, MD    Other Instructions

## 2023-02-12 ENCOUNTER — Other Ambulatory Visit: Payer: Self-pay

## 2023-02-21 ENCOUNTER — Other Ambulatory Visit (HOSPITAL_COMMUNITY): Payer: Self-pay

## 2023-02-22 ENCOUNTER — Other Ambulatory Visit: Payer: Self-pay | Admitting: "Endocrinology

## 2023-03-06 ENCOUNTER — Other Ambulatory Visit (HOSPITAL_COMMUNITY)
Admission: RE | Admit: 2023-03-06 | Discharge: 2023-03-06 | Disposition: A | Discharge status: Home / Self Care | Payer: Medicare Other | Source: Ambulatory Visit | Attending: Nurse Practitioner | Admitting: Nurse Practitioner

## 2023-03-06 ENCOUNTER — Inpatient Hospital Stay: Payer: Medicare Other | Attending: Genetics

## 2023-03-06 DIAGNOSIS — E785 Hyperlipidemia, unspecified: Secondary | ICD-10-CM | POA: Insufficient documentation

## 2023-03-06 DIAGNOSIS — C61 Malignant neoplasm of prostate: Secondary | ICD-10-CM

## 2023-03-06 DIAGNOSIS — Z79899 Other long term (current) drug therapy: Secondary | ICD-10-CM | POA: Insufficient documentation

## 2023-03-06 DIAGNOSIS — M8580 Other specified disorders of bone density and structure, unspecified site: Secondary | ICD-10-CM | POA: Insufficient documentation

## 2023-03-06 DIAGNOSIS — Z923 Personal history of irradiation: Secondary | ICD-10-CM | POA: Diagnosis not present

## 2023-03-06 DIAGNOSIS — Z7952 Long term (current) use of systemic steroids: Secondary | ICD-10-CM | POA: Insufficient documentation

## 2023-03-06 DIAGNOSIS — F419 Anxiety disorder, unspecified: Secondary | ICD-10-CM | POA: Insufficient documentation

## 2023-03-06 DIAGNOSIS — Z8546 Personal history of malignant neoplasm of prostate: Secondary | ICD-10-CM | POA: Insufficient documentation

## 2023-03-06 DIAGNOSIS — F17210 Nicotine dependence, cigarettes, uncomplicated: Secondary | ICD-10-CM | POA: Insufficient documentation

## 2023-03-06 LAB — CBC WITH DIFFERENTIAL/PLATELET
Abs Immature Granulocytes: 0.05 10*3/uL (ref 0.00–0.07)
Basophils Absolute: 0 10*3/uL (ref 0.0–0.1)
Basophils Relative: 1 %
Eosinophils Absolute: 0.2 10*3/uL (ref 0.0–0.5)
Eosinophils Relative: 2 %
HCT: 43.8 % (ref 39.0–52.0)
Hemoglobin: 14.5 g/dL (ref 13.0–17.0)
Immature Granulocytes: 1 %
Lymphocytes Relative: 12 %
Lymphs Abs: 0.9 10*3/uL (ref 0.7–4.0)
MCH: 33 pg (ref 26.0–34.0)
MCHC: 33.1 g/dL (ref 30.0–36.0)
MCV: 99.5 fL (ref 80.0–100.0)
Monocytes Absolute: 0.6 10*3/uL (ref 0.1–1.0)
Monocytes Relative: 8 %
Neutro Abs: 6.2 10*3/uL (ref 1.7–7.7)
Neutrophils Relative %: 76 %
Platelets: 156 10*3/uL (ref 150–400)
RBC: 4.4 MIL/uL (ref 4.22–5.81)
RDW: 13.2 % (ref 11.5–15.5)
WBC: 8 10*3/uL (ref 4.0–10.5)
nRBC: 0 % (ref 0.0–0.2)

## 2023-03-06 LAB — COMPREHENSIVE METABOLIC PANEL
ALT: 22 U/L (ref 0–44)
AST: 22 U/L (ref 15–41)
Albumin: 4.3 g/dL (ref 3.5–5.0)
Alkaline Phosphatase: 70 U/L (ref 38–126)
Anion gap: 11 (ref 5–15)
BUN: 12 mg/dL (ref 8–23)
CO2: 25 mmol/L (ref 22–32)
Calcium: 9.3 mg/dL (ref 8.9–10.3)
Chloride: 103 mmol/L (ref 98–111)
Creatinine, Ser: 1.17 mg/dL (ref 0.61–1.24)
GFR, Estimated: >60 mL/min (ref >60)
Glucose, Bld: 124 mg/dL — ABNORMAL HIGH (ref 70–99)
Potassium: 4.7 mmol/L (ref 3.5–5.1)
Sodium: 139 mmol/L (ref 135–145)
Total Bilirubin: 0.5 mg/dL (ref 0.3–1.2)
Total Protein: 7.4 g/dL (ref 6.5–8.1)

## 2023-03-06 LAB — LIPID PANEL
Cholesterol: 127 mg/dL (ref 0–200)
HDL: 59 mg/dL (ref >40)
LDL Cholesterol: 48 mg/dL (ref 0–99)
Total CHOL/HDL Ratio: 2.2 RATIO
Triglycerides: 99 mg/dL (ref <150)
VLDL: 20 mg/dL (ref 0–40)

## 2023-03-06 LAB — MAGNESIUM: Magnesium: 2.2 mg/dL (ref 1.7–2.4)

## 2023-03-06 LAB — PSA: Prostatic Specific Antigen: 0.19 ng/mL (ref 0.00–4.00)

## 2023-03-07 ENCOUNTER — Other Ambulatory Visit: Payer: Medicare Other

## 2023-03-12 ENCOUNTER — Ambulatory Visit (INDEPENDENT_AMBULATORY_CARE_PROVIDER_SITE_OTHER): Payer: Medicare Other | Admitting: "Endocrinology

## 2023-03-12 ENCOUNTER — Encounter: Payer: Self-pay | Admitting: "Endocrinology

## 2023-03-12 VITALS — BP 112/70 | HR 84 | Ht 70.0 in | Wt 155.4 lb

## 2023-03-12 DIAGNOSIS — E1159 Type 2 diabetes mellitus with other circulatory complications: Secondary | ICD-10-CM | POA: Diagnosis not present

## 2023-03-12 DIAGNOSIS — E782 Mixed hyperlipidemia: Secondary | ICD-10-CM

## 2023-03-12 LAB — POCT GLYCOSYLATED HEMOGLOBIN (HGB A1C): HbA1c, POC (controlled diabetic range): 7.3 % — AB (ref 0.0–7.0)

## 2023-03-12 NOTE — Progress Notes (Signed)
03/12/2023, 6:15 PM          Endocrinology follow-up note   Subjective:    Patient ID: Stephen Che., male    DOB: 03/17/1951.  Stephen Che. is being seen in follow up in the management of his currently  controlled symptomatic type 2 diabetes requested by  [name], PA-C   Past Medical History:  Diagnosis Date   Abdominal aortic aneurysm (AAA) ([place])    Anxiety    COPD (chronic obstructive pulmonary disease) ([place])    Coronary artery calcification seen on CT scan    Essential hypertension    Lung cancer ([place])    Non small cell carcinoma - XRT   Non-small cell carcinoma of left lung, stage 1 ([place]) 09/21/2017   Prostate cancer ([place])    XRT   Stroke ([place])    Type 2 diabetes mellitus ([place])    Past Surgical History:  Procedure Laterality Date   COLONOSCOPY N/A 11/23/2015   Procedure: COLONOSCOPY;  Surgeon: Aviva Signs, MD;  Location: [facility];  Service: Gastroenterology;  Laterality: N/A;   FRACTURE SURGERY     PROSTATE BIOPSY     SINUS SURGERY WITH INSTATRAK     Social History   Socioeconomic History   Marital status: Married    Spouse name: Not on file   Number of children: Not on file   Years of education: Not on file   Highest education level: Not on file  Occupational History   Not on file  Tobacco Use   Smoking status: Every Day    Packs/day: 2.00    Years: 44.00    Additional pack years: 0.00    Total pack years: 88.00    Types: Cigarettes   Smokeless tobacco: Never  Vaping Use   Vaping Use: Never used  Substance and Sexual Activity   Alcohol use: No    Alcohol/week: 0.0 standard drinks of alcohol   Drug use: No   Sexual activity: Not on file  Other Topics Concern   Not on file  Social History Narrative   Not on file   Social Determinants of Health   Financial Resource Strain: Not on file  Food Insecurity: Not on file  Transportation Needs: Not on file  Physical Activity: Not on file  Stress:  Not on file  Social Connections: Not on file   Outpatient Encounter Medications as of 03/12/2023  Medication Sig   abiraterone acetate (ZYTIGA) 250 MG tablet Take 2 tablets (500 mg total) by mouth daily. Take on an empty stomach 1 hour before or 2 hours after a meal   alfuzosin (UROXATRAL) 10 MG 24 hr tablet Take 1 tablet (10 mg total) by mouth daily with breakfast.   alprazolam (XANAX) 2 MG tablet Take 2 mg by mouth 2 (two) times daily.   Blood Glucose Monitoring Suppl (ACCU-CHEK GUIDE ME) w/Device KIT 1 Piece by Does not apply route as directed.   citalopram (CELEXA) 40 MG tablet Take 20-40 mg by mouth daily.   glipiZIDE (GLUCOTROL) 5 MG tablet TAKE 1 TABLET BY MOUTH EVERY MORNING BEFORE BREAKFAST   glucose blood (ACCU-CHEK GUIDE) test strip Use to test glucose 4 times a day.   multivitamin (ONE-A-DAY MEN'S) TABS tablet Take 1 tablet by mouth daily.   predniSONE (DELTASONE) 10 MG tablet Take 1 tablet (10 mg total) by mouth daily with breakfast.   rosuvastatin (CRESTOR) 10 MG tablet Take 1 tablet (10 mg  total) by mouth daily.   No facility-administered encounter medications on file as of 03/12/2023.    ALLERGIES: Allergies  Allergen Reactions   Sulfa Antibiotics Hives, Itching and Swelling    Tongue swelling   Sulfasalazine Hives, Itching and Swelling    Tongue swelling   Lipitor [Atorvastatin] Other (See Comments)    myalgia   Simvastatin Other (See Comments)    myalgia   Iodinated Contrast Media Nausea And Vomiting    VACCINATION STATUS:  There is no immunization history on file for this patient.  Diabetes He presents for his follow-up diabetic visit. He has type 2 diabetes mellitus. Onset time: He was diagnosed at approximate age of 19 years. His disease course has been stable. There are no hypoglycemic associated symptoms. Pertinent negatives for hypoglycemia include no confusion, headaches, pallor or seizures. Associated symptoms include blurred vision. Pertinent negatives  for diabetes include no chest pain, no fatigue, no polydipsia, no polyphagia, no polyuria and no weakness. There are no hypoglycemic complications. Symptoms are stable. Diabetic complications include a CVA. Risk factors for coronary artery disease include diabetes mellitus, hypertension, male sex and tobacco exposure. Current diabetic treatment includes oral agent (monotherapy) (Is currently taking glipizide 10 mg p.o. twice daily.). His weight is stable. He is following a generally unhealthy diet. When asked about meal planning, he reported none. He has not had a previous visit with a dietitian. He never participates in exercise. His home blood glucose trend is increasing steadily. His breakfast blood glucose range is generally 130-140 mg/dl. His bedtime blood glucose range is generally 130-140 mg/dl. His overall blood glucose range is 130-140 mg/dl. (He presents with near target glycemic profile.  His point-of-care 7.3%, increasing from 6.2%.    He has nor hypoglycemia.     ) An ACE inhibitor/angiotensin II receptor blocker is not being taken. Eye exam is current.   Review of systems: Limited as above.    Objective:    BP 112/70   Pulse 84   Ht 5\' 10"  (1.778 m)   Wt 155 lb 6.4 oz (70.5 kg)   BMI 22.30 kg/m   Wt Readings from Last 3 Encounters:  03/12/23 155 lb 6.4 oz (70.5 kg)  02/07/23 154 lb 3.2 oz (69.9 kg)  12/12/22 155 lb 6.8 oz (70.5 kg)       Recent Results (from the past 2160 hour(s))  PSA     Status: None   Collection Time: 03/06/23  2:11 PM  Result Value Ref Range   Prostatic Specific Antigen 0.19 0.00 - 4.00 ng/mL    Comment: (NOTE) While PSA levels of <=4.00 ng/ml are reported as reference range, some men with levels below 4.00 ng/ml can have prostate cancer and many men with PSA above 4.00 ng/ml do not have prostate cancer.  Other tests such as free PSA, age specific reference ranges, PSA velocity and PSA doubling time may be helpful especially in men less than 33  years old. Performed at [facility], [place] [address], [place], [place] [address]   CBC with Differential/Platelet     Status: None   Collection Time: 03/06/23  2:11 PM  Result Value Ref Range   WBC 8.0 4.0 - 10.5 K/uL   RBC 4.40 4.22 - 5.81 MIL/uL   Hemoglobin 14.5 13.0 - 17.0 g/dL   HCT 43.8 39.0 - 52.0 %   MCV 99.5 80.0 - 100.0 fL   MCH 33.0 26.0 - 34.0 pg   MCHC 33.1 30.0 - 36.0 g/dL  RDW 13.2 11.5 - 15.5 %   Platelets 156 150 - 400 K/uL   nRBC 0.0 0.0 - 0.2 %   Neutrophils Relative % 76 %   Neutro Abs 6.2 1.7 - 7.7 K/uL   Lymphocytes Relative 12 %   Lymphs Abs 0.9 0.7 - 4.0 K/uL   Monocytes Relative 8 %   Monocytes Absolute 0.6 0.1 - 1.0 K/uL   Eosinophils Relative 2 %   Eosinophils Absolute 0.2 0.0 - 0.5 K/uL   Basophils Relative 1 %   Basophils Absolute 0.0 0.0 - 0.1 K/uL   Immature Granulocytes 1 %   Abs Immature Granulocytes 0.05 0.00 - 0.07 K/uL    Comment: Performed at [facility], [address], [place], [place] [address]  Comprehensive metabolic panel     Status: Abnormal   Collection Time: 03/06/23  2:11 PM  Result Value Ref Range   Sodium 139 135 - 145 mmol/L   Potassium 4.7 3.5 - 5.1 mmol/L   Chloride 103 98 - 111 mmol/L   CO2 25 22 - 32 mmol/L   Glucose, Bld 124 (H) 70 - 99 mg/dL    Comment: Glucose reference range applies only to samples taken after fasting for at least 8 hours.   BUN 12 8 - 23 mg/dL   Creatinine, Ser 1.17 0.61 - 1.24 mg/dL   Calcium 9.3 8.9 - 10.3 mg/dL   Total Protein 7.4 6.5 - 8.1 g/dL   Albumin 4.3 3.5 - 5.0 g/dL   AST 22 15 - 41 U/L   ALT 22 0 - 44 U/L   Alkaline Phosphatase 70 38 - 126 U/L   Total Bilirubin 0.5 0.3 - 1.2 mg/dL   GFR, Estimated >60 >60 mL/min    Comment: (NOTE) Calculated using the CKD-EPI Creatinine Equation (2021)    Anion gap 11 5 - 15    Comment: Performed at [facility], [address], [place], [place] [address]  Magnesium     Status: None   Collection Time: 03/06/23  2:11 PM  Result Value  Ref Range   Magnesium 2.2 1.7 - 2.4 mg/dL    Comment: Performed at [facility], [address], [place],  [address]  Lipid panel     Status: None   Collection Time: 03/06/23  2:13 PM  Result Value Ref Range   Cholesterol 127 0 - 200 mg/dL   Triglycerides 99 <150 mg/dL   HDL 59 >40 mg/dL   Total CHOL/HDL Ratio 2.2 RATIO   VLDL 20 0 - 40 mg/dL   LDL Cholesterol 48 0 - 99 mg/dL    Comment:        Total Cholesterol/HDL:CHD Risk Coronary Heart Disease Risk Table                     Men   Women  1/2 Average Risk   3.4   3.3  Average Risk       5.0   4.4  2 X Average Risk   9.6   7.1  3 X Average Risk  23.4   11.0        Use the calculated Patient Ratio above and the CHD Risk Table to determine the patient's CHD Risk.        ATP III CLASSIFICATION (LDL):  <100     mg/dL   Optimal  100-129  mg/dL   Near or Above  Optimal  130-159  mg/dL   Borderline  160-189  mg/dL   High  >190     mg/dL   Very High Performed at [facility], [address] [place], [place] [address]   HgB A1c     Status: Abnormal   Collection Time: 03/12/23  2:58 PM  Result Value Ref Range   Hemoglobin A1C     HbA1c POC (<> result, manual entry)     HbA1c, POC (prediabetic range)     HbA1c, POC (controlled diabetic range) 7.3 (A) 0.0 - 7.0 %      Assessment & Plan:   1. DM type 2 causing vascular disease ([place])  - Stephen Che. has currently uncontrolled symptomatic type 2 DM since 72 years of age. He presents with near target glycemic profile.  His point-of-care 7.3%, increasing from 6.2%.    He has nor hypoglycemia.    Recent labs are reviewed with him.   -his diabetes is complicated by CVA , chronic heavy smoking, and he remains at extremely high risk for more acute and chronic complications which include CAD, CVA, CKD, retinopathy, and neuropathy. These are all discussed in detail with him.  - I have counseled him on diet management by adopting a carbohydrate  restricted/protein rich diet. - he  admits there is a room for improvement in his diet and drink choices. -  Suggestion is made for him to avoid simple carbohydrates  from his diet including Cakes, Sweet Desserts / Pastries, Ice Cream, Soda (diet and regular), Sweet Tea, Candies, Chips, Cookies, Sweet Pastries,  Store Bought Juices, Alcohol in Excess of  1-2 drinks a day, Artificial Sweeteners, Coffee Creamer, and "Sugar-free" Products. This will help patient to have stable blood glucose profile and potentially avoid unintended weight gain.   - I encouraged him to switch to  unprocessed or minimally processed complex starch and increased protein intake (animal or plant source), fruits, and vegetables.  - he is advised to stick to a routine mealtimes to eat 3 meals  a day and avoid unnecessary snacks ( to snack only to correct hypoglycemia).    - I have approached him with the following individualized plan to manage diabetes and patient agrees:   -Based on his presentation with near target glycemic control, he will not need additional intervention other than his glipizide 5 mg XL p.o. daily at breakfast.  He agrees and advised to continue monitoring blood glucose twice a day-daily before breakfast and at bedtime.   -Priority in this patient will be to avoid hypoglycemia. - he is encouraged to call clinic for blood glucose levels less than 70 or above 200 mg /dl.  -He is not a suitable candidate for metformin, SGLT2 inhibitors, nor incretin therapy. -He will be reapproached for insulin treatment if he loses control by next visit.  - Patient specific target  A1c;  LDL, HDL, Triglycerides  were discussed in detail.  2) BP/HTN:  -His blood pressure is controlled to target.   He is not on any antihypertensive medications, reported sulfa allergy.   3) Lipids/HPL: His recent labs show LDL of 48.  He is advised to continue Crestor 20 mg p.o. daily at bedtime.    4)  Weight/Diet: He was recently  diagnosed with lung cancer waiting for radiation therapy.  Is not a candidate for weight loss.   The patient was counseled on the dangers of tobacco use, and was advised to quit.  Reviewed strategies to maximize success, including removing  cigarettes and smoking materials from environment.   5) Chronic Care/Health Maintenance:  -he  Is not on ACEI/ARB and Statin medications and  is encouraged to initiate and continue to follow up with Ophthalmology, Dentist,  Podiatrist at least yearly or according to recommendations, and advised to  Quit smoking (this is his #1health risk). I have recommended yearly flu vaccine and pneumonia vaccine at least every 5 years; moderate intensity exercise for up to 150 minutes weekly; and  sleep for at least 7 hours a day.  The patient was counseled on the dangers of tobacco use, and was advised to quit.  Reviewed strategies to maximize success, including removing cigarettes and smoking materials from environment.  - I advised patient to maintain close follow up with [name], PA-C for primary care needs.   I spent  22 minutes in the care of the patient today including review of labs from Thyroid Function, CMP, and other relevant labs ; imaging/biopsy records (current and previous including abstractions from other facilities); face-to-face time discussing  his lab results and symptoms, medications doses, his options of short and long term treatment based on the latest standards of care / guidelines;   and documenting the encounter.  Stephen Che.  participated in the discussions, expressed understanding, and voiced agreement with the above plans.  All questions were answered to his satisfaction. he is encouraged to contact clinic should he have any questions or concerns prior to his return visit.   Follow up plan: - Return in about 6 months (around 09/12/2023) for Bring Meter/CGM Device/Logs- A1c in Office.  Glade Lloyd, MD [facility]  [facility] [address] [place], [place] [address] Phone: [phone]  Fax: [phone]    03/12/2023, 6:15 PM  This note was partially dictated with voice recognition software. Similar sounding words can be transcribed inadequately or may not  be corrected upon review.

## 2023-03-12 NOTE — Patient Instructions (Signed)
                                     Advice for Weight Management  -For most of us the best way to lose weight is by diet management. Generally speaking, diet management means consuming less calories intentionally which over time brings about progressive weight loss.  This can be achieved more effectively by avoiding ultra processed carbohydrates, processed meats, unhealthy fats.    It is critically important to know your numbers: how much calorie you are consuming and how much calorie you need. More importantly, our carbohydrates sources should be unprocessed naturally occurring  complex starch food items.  It is always important to balance nutrition also by  appropriate intake of proteins (mainly plant-based), healthy fats/oils, plenty of fruits and vegetables.   -The American College of Lifestyle Medicine (ACL M) recommends nutrition derived mostly from Whole Food, Plant Predominant Sources example an apple instead of applesauce or apple pie. Eat Plenty of vegetables, Mushrooms, fruits, Legumes, Whole Grains, Nuts, seeds in lieu of processed meats, processed snacks/pastries red meat, poultry, eggs.  Use only water or unsweetened tea for hydration.  The College also recommends the need to stay away from risky substances including alcohol, smoking; obtaining 7-9 hours of restorative sleep, at least 150 minutes of moderate intensity exercise weekly, importance of healthy social connections, and being mindful of stress and seek help when it is overwhelming.    -Sticking to a routine mealtime to eat 3 meals a day and avoiding unnecessary snacks is shown to have a big role in weight control. Under normal circumstances, the only time we burn stored energy is when we are hungry, so allow  some hunger to take place- hunger means no food between appropriate meal times, only water.  It is not advisable to starve.   -It is better to avoid simple carbohydrates including:  Cakes, Sweet Desserts, Ice Cream, Soda (diet and regular), Sweet Tea, Candies, Chips, Cookies, Store Bought Juices, Alcohol in Excess of  1-2 drinks a day, Lemonade,  Artificial Sweeteners, Doughnuts, Coffee Creamers, "Sugar-free" Products, etc, etc.  This is not a complete list.....    -Consulting with certified diabetes educators is proven to provide you with the most accurate and current information on diet.  Also, you may be  interested in discussing diet options/exchanges , we can schedule a visit with Stephen Palmer, RDN, CDE for individualized nutrition education.  -Exercise: If you are able: 30 -60 minutes a day ,4 days a week, or 150 minutes of moderate intensity exercise weekly.    The longer the better if tolerated.  Combine stretch, strength, and aerobic activities.  If you were told in the past that you have high risk for cardiovascular diseases, or if you are currently symptomatic, you may seek evaluation by your heart doctor prior to initiating moderate to intense exercise programs.                                  Additional Care Considerations for Diabetes/Prediabetes   -Diabetes  is a chronic disease.  The most important care consideration is regular follow-up with your diabetes care provider with the goal being avoiding or delaying its complications and to take advantage of advances in medications and technology.  If appropriate actions are taken early enough, type 2 diabetes can even be   reversed.  Seek information from the right source.  - Whole Food, Plant Predominant Nutrition is highly recommended: Eat Plenty of vegetables, Mushrooms, fruits, Legumes, Whole Grains, Nuts, seeds in lieu of processed meats, processed snacks/pastries red meat, poultry, eggs as recommended by American College of  Lifestyle Medicine (ACLM).  -Type 2 diabetes is known to coexist with other important comorbidities such as high blood pressure and high cholesterol.  It is critical to control not only the  diabetes but also the high blood pressure and high cholesterol to minimize and delay the risk of complications including coronary artery disease, stroke, amputations, blindness, etc.  The good news is that this diet recommendation for type 2 diabetes is also very helpful for managing high cholesterol and high blood blood pressure.  - Studies showed that people with diabetes will benefit from a class of medications known as ACE inhibitors and statins.  Unless there are specific reasons not to be on these medications, the standard of care is to consider getting one from these groups of medications at an optimal doses.  These medications are generally considered safe and proven to help protect the heart and the kidneys.    - People with diabetes are encouraged to initiate and maintain regular follow-up with eye doctors, foot doctors, dentists , and if necessary heart and kidney doctors.     - It is highly recommended that people with diabetes quit smoking or stay away from smoking, and get yearly  flu vaccine and pneumonia vaccine at least every 5 years.  See above for additional recommendations on exercise, sleep, stress management , and healthy social connections.      

## 2023-03-13 ENCOUNTER — Inpatient Hospital Stay (HOSPITAL_BASED_OUTPATIENT_CLINIC_OR_DEPARTMENT_OTHER): Payer: Medicare Other | Admitting: Hematology

## 2023-03-13 ENCOUNTER — Encounter: Payer: Self-pay | Admitting: Hematology

## 2023-03-13 VITALS — BP 122/73 | HR 86 | Temp 97.3°F | Resp 18 | Ht 70.0 in | Wt 155.1 lb

## 2023-03-13 DIAGNOSIS — Z8546 Personal history of malignant neoplasm of prostate: Secondary | ICD-10-CM

## 2023-03-13 DIAGNOSIS — F419 Anxiety disorder, unspecified: Secondary | ICD-10-CM | POA: Diagnosis not present

## 2023-03-13 DIAGNOSIS — M8580 Other specified disorders of bone density and structure, unspecified site: Secondary | ICD-10-CM | POA: Diagnosis not present

## 2023-03-13 DIAGNOSIS — Z923 Personal history of irradiation: Secondary | ICD-10-CM | POA: Diagnosis not present

## 2023-03-13 DIAGNOSIS — Z7952 Long term (current) use of systemic steroids: Secondary | ICD-10-CM

## 2023-03-13 DIAGNOSIS — C61 Malignant neoplasm of prostate: Secondary | ICD-10-CM

## 2023-03-13 DIAGNOSIS — Z79899 Other long term (current) drug therapy: Secondary | ICD-10-CM | POA: Diagnosis not present

## 2023-03-13 DIAGNOSIS — F17210 Nicotine dependence, cigarettes, uncomplicated: Secondary | ICD-10-CM

## 2023-03-13 DIAGNOSIS — E785 Hyperlipidemia, unspecified: Secondary | ICD-10-CM | POA: Diagnosis not present

## 2023-03-13 NOTE — Progress Notes (Signed)
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 - Prostate IMRT from 12/13/2016 through 02/09/2017, 78 Gray in 40 fractions with ADT. - Lupron started back on 09/01/2020 for rising PSA levels. - PSMA PET scan (03/30/2022): Nodes in the retroperitoneum, right pelvis, left lower jugular/supraclavicular stations.  No bone metastasis.  No local recurrence. - Enzalutamide started on 04/03/2022, discontinued due to extreme fatigue, vision changes and jaw pain. - Apalutamide started on 05/01/2022, taking only 2 pills but experiences jaw pain and neck pain and vision changes.  Discontinued on 05/09/2022 due to poor tolerance.       - XRT with Dr. Tammi Klippel from 05/30/2022 through 06/30/2022.       -Abiraterone 1000 mg and prednisone 5 mg started on 07/06/2022, dose reduced to 750 mg daily, dose further reduced to 500 mg daily on 12/12/2022 due to severe fatigue.    Social/family history: - Lives at home with his wife.  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 - Current active smoker, 2 packs/day for 55 years.  Rolls his own cigarettes. - Mother had skin cancers.  3.  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 - Definitive SBRT from 10/05/2017 through 10/12/2017, 54 Gray in 3 fractions of [place].  Plan: Metastatic CRPC to the lymph nodes: - At last visit I  have cut back on Abiraterone dose to 500 mg with prednisone 10 mg daily. - He continues to have extreme fatigue.  He is sleeping most part of the day. - Reviewed labs from 03/05/2022 which showed normal LFTs.  CBC was normal.  PSA stable at 0.19. - I have recommended that he further cut back Abiraterone to 250 mg and change the timing to bedtime. - He will get Eligard 45 mg tomorrow with Dr. Alyson Ingles. - RTC 3 months for follow-up with repeat PSA.   2.  Osteopenia (DEXA scan on 05/17/2022 T score -2.3): - We have discussed starting on denosumab to decrease SRE.  He is not interested. - Continue calcium and D supplements.  Orders Placed This Encounter  Procedures   CBC with Differential/Platelet    Standing Status:   Future    Standing Expiration Date:   03/12/2024    Order Specific Question:   Release to patient    Answer:   Immediate   Comprehensive metabolic panel    Standing Status:   Future    Standing Expiration Date:   03/12/2024    Order Specific Question:   Release to patient    Answer:   Immediate   PSA    Standing Status:   Future    Standing Expiration Date:   03/12/2024    I,Alexis Herring,acting as a scribe for Derek Jack, MD.,have documented all relevant documentation on the behalf of Derek Jack, MD,as directed by  Derek Jack, MD while in the presence of Derek Jack, MD.  I, Derek Jack MD, have reviewed the above  documentation for accuracy and completeness, and I agree with the above.   Derek Jack, MD   3/26/20245:47 PM  CHIEF COMPLAINT:   Diagnosis: castration refractory prostate cancer to the lymph nodes    Cancer Staging  Prostate cancer metastatic to intrapelvic lymph node [facility]) Staging form: Prostate, AJCC 7th Edition - Clinical: Stage IV (T1c, N1, M1a, PSA: Less than 10, Gleason 8-10) - Unsigned    Prior Therapy:  Prostate IMRT from 12/13/2016 through 02/09/2017, 78 Gray in 40 fractions with ADT   Enzalutamide started on 04/03/2022, discontinued due to extreme fatigue, vision changes and jaw pain Apalutamide started on 05/01/2022, taking only 2 pills but experiences jaw pain and neck pain and vision changes.  Discontinued on 05/09/2022 due to poor tolerance XRT with Dr. Tammi Klippel from 05/30/2022 through 06/30/2022  Abiraterone 1000 mg and prednisone 5 mg started on 07/06/2022   Current Therapy:  Lupron every 6 months- started 09/01/2020   HISTORY OF PRESENT ILLNESS:   Oncology History  Prostate cancer metastatic to intrapelvic lymph node ([place])  09/29/2016 Initial Diagnosis   Prostate cancer metastatic to intrapelvic lymph node (HCC)    Genetic Testing   Negative genetic testing. No pathogenic variants identified on the Common Hereditary Cancers+RNA panel. The report date is 06/23/2022.  The Common Hereditary Cancers Panel + RNA offered by [facility] includes sequencing and/or deletion duplication testing of the following 47 genes: APC, ATM, AXIN2, BARD1, BMPR1A, BRCA1, BRCA2, BRIP1, CDH1, CDKN2A (p14ARF), CDKN2A (p16INK4a), CKD4, CHEK2, CTNNA1, DICER1, EPCAM (Deletion/duplication testing only), GREM1 (promoter region deletion/duplication testing only), KIT, MEN1, MLH1, MSH2, MSH3, MSH6, MUTYH, NBN, NF1, NHTL1, PALB2, PDGFRA, PMS2, POLD1, POLE, PTEN, RAD50, RAD51C, RAD51D, SDHB, SDHC, SDHD, SMAD4, SMARCA4. STK11, TP53, TSC1, TSC2, and VHL.  The following genes were evaluated for sequence changes only: SDHA and HOXB13 c.251G>A variant only.      INTERVAL HISTORY:   Lameek is a 72 y.o. male presenting to clinic today for follow up of castration refractory prostate cancer to the lymph nodes. He was last seen by me on 12/12/22.  Today, he states that he continues to have diffuse back pain extending from his neck down to his low back. His appetite level is at 50%. His energy level is at 0%. He reports sleeping off and on throughout the day due to his fatigue. His wife states that he watches YouTube  close to bedtime which causes him to have difficulty going to sleep. He reports having intermittent spots on his bilateral forearms since starting treatment. He is taking Zytiga 500mg  daily.  PAST MEDICAL HISTORY:   Past Medical History: Past Medical History:  Diagnosis Date   Abdominal aortic aneurysm (AAA) (HCC)    Anxiety    COPD (chronic obstructive pulmonary disease) ([place])    Coronary artery calcification seen on CT scan    Essential hypertension    Lung cancer (HCC)    Non small cell carcinoma - XRT   Non-small cell carcinoma of left lung, stage 1 ([place]) 09/21/2017   Prostate cancer ([place])    XRT   Stroke ([place])    Type 2 diabetes mellitus ([place])     Surgical History: Past Surgical History:  Procedure Laterality Date   COLONOSCOPY N/A 11/23/2015   Procedure: COLONOSCOPY;  Surgeon: Aviva Signs, MD;  [facility];  Service: Gastroenterology;  Laterality: N/A;   FRACTURE SURGERY     PROSTATE BIOPSY     SINUS SURGERY WITH INSTATRAK      Social History: Social History  Socioeconomic History   Marital status: Married    Spouse name: Not on file   Number of children: Not on file   Years of education: Not on file   Highest education level: Not on file  Occupational History   Not on file  Tobacco Use   Smoking status: Every Day    Packs/day: 2.00    Years: 44.00    Additional pack years: 0.00    Total pack years: 88.00    Types: Cigarettes   Smokeless tobacco: Never  Vaping Use   Vaping Use: Never used  Substance and Sexual Activity   Alcohol use: No    Alcohol/week: 0.0 standard drinks of alcohol   Drug use: No   Sexual activity: Not on file  Other Topics Concern   Not on file  Social History Narrative   Not on file   Social Determinants of Health   Financial Resource Strain: Not on file  Food Insecurity: Not on file  Transportation Needs: Not on file  Physical Activity: Not on file  Stress: Not on file  Social Connections: Not on file   Intimate Partner Violence: Not on file    Family History: Family History  Problem Relation Age of Onset   Heart disease Mother    Skin cancer Mother    Heart disease Father        before age 32   Skin cancer Brother    Breast cancer Maternal Aunt    Cancer Neg Hx     Current Medications:  Current Outpatient Medications:    abiraterone acetate (ZYTIGA) 250 MG tablet, Take 2 tablets (500 mg total) by mouth daily. Take on an empty stomach 1 hour before or 2 hours after a meal, Disp: 60 tablet, Rfl: 2   alfuzosin (UROXATRAL) 10 MG 24 hr tablet, Take 1 tablet (10 mg total) by mouth daily with breakfast., Disp: 30 tablet, Rfl: 11   alprazolam (XANAX) 2 MG tablet, Take 2 mg by mouth 2 (two) times daily., Disp: , Rfl:    Blood Glucose Monitoring Suppl (ACCU-CHEK GUIDE ME) w/Device KIT, 1 Piece by Does not apply route as directed., Disp: 1 kit, Rfl: 0   citalopram (CELEXA) 40 MG tablet, Take 20-40 mg by mouth daily., Disp: , Rfl:    glipiZIDE (GLUCOTROL) 5 MG tablet, TAKE 1 TABLET BY MOUTH EVERY MORNING BEFORE BREAKFAST, Disp: 90 tablet, Rfl: 0   glucose blood (ACCU-CHEK GUIDE) test strip, Use to test glucose 4 times a day., Disp: 200 each, Rfl: 2   multivitamin (ONE-A-DAY MEN'S) TABS tablet, Take 1 tablet by mouth daily., Disp: , Rfl:    predniSONE (DELTASONE) 10 MG tablet, Take 1 tablet (10 mg total) by mouth daily with breakfast., Disp: 30 tablet, Rfl: 6   rosuvastatin (CRESTOR) 10 MG tablet, Take 1 tablet (10 mg total) by mouth daily., Disp: 90 tablet, Rfl: 3   Allergies: Allergies  Allergen Reactions   Sulfa Antibiotics Hives, Itching and Swelling    Tongue swelling   Sulfasalazine Hives, Itching and Swelling    Tongue swelling   Lipitor [Atorvastatin] Other (See Comments)    myalgia   Simvastatin Other (See Comments)    myalgia   Iodinated Contrast Media Nausea And Vomiting    REVIEW OF SYSTEMS:   Review of Systems  Constitutional:  Negative for chills, fatigue and fever.   HENT:   Negative for lump/mass, mouth sores, nosebleeds, sore throat and trouble swallowing.   Eyes:  Negative for eye problems.  Respiratory:  Positive for shortness of breath. Negative for cough.   Cardiovascular:  Negative for chest pain, leg swelling and palpitations.  Gastrointestinal:  Negative for abdominal pain, constipation, diarrhea, nausea and vomiting.  Genitourinary:  Positive for difficulty urinating. Negative for bladder incontinence, dysuria, frequency, hematuria and nocturia.   Musculoskeletal:  Positive for back pain (5/10 in severity) and neck pain. Negative for arthralgias, flank pain and myalgias.  Skin:  Positive for rash (arms). Negative for itching.  Neurological:  Negative for dizziness, headaches and numbness.  Hematological:  Does not bruise/bleed easily.  Psychiatric/Behavioral:  Negative for depression, sleep disturbance and suicidal ideas. The patient is not nervous/anxious.   All other systems reviewed and are negative.    VITALS:   Blood pressure 122/73, pulse 86, temperature (!) 97.3 F (36.3 C), temperature source Tympanic, resp. rate 18, height 5\' 10"  (1.778 m), weight 155 lb 1.6 oz (70.4 kg), SpO2 95 %.  Wt Readings from Last 3 Encounters:  03/13/23 155 lb 1.6 oz (70.4 kg)  03/12/23 155 lb 6.4 oz (70.5 kg)  02/07/23 154 lb 3.2 oz (69.9 kg)    Body mass index is 22.25 kg/m.  Performance status (ECOG): 1 - Symptomatic but completely ambulatory  PHYSICAL EXAM:   Physical Exam Vitals and nursing note reviewed. Exam conducted with a chaperone present.  Constitutional:      Appearance: Normal appearance.  Cardiovascular:     Rate and Rhythm: Normal rate and regular rhythm.     Pulses: Normal pulses.     Heart sounds: Normal heart sounds.  Pulmonary:     Effort: Pulmonary effort is normal.     Breath sounds: Normal breath sounds.  Abdominal:     Palpations: Abdomen is soft. There is no hepatomegaly, splenomegaly or mass.     Tenderness: There  is no abdominal tenderness.  Musculoskeletal:     Right lower leg: No edema.     Left lower leg: No edema.  Lymphadenopathy:     Cervical: No cervical adenopathy.     Right cervical: No superficial, deep or posterior cervical adenopathy.    Left cervical: No superficial, deep or posterior cervical adenopathy.     Upper Body:     Right upper body: No supraclavicular or axillary adenopathy.     Left upper body: No supraclavicular or axillary adenopathy.  Neurological:     General: No focal deficit present.     Mental Status: He is alert and oriented to person, place, and time.  Psychiatric:        Mood and Affect: Mood normal.        Behavior: Behavior normal.     LABS:      Latest Ref Rng & Units 03/06/2023    2:11 PM 12/04/2022    9:56 AM 09/25/2022   11:18 AM  CBC  WBC 4.0 - 10.5 K/uL 8.0  8.0  7.9   Hemoglobin 13.0 - 17.0 g/dL 14.5  13.7  12.8   Hematocrit 39.0 - 52.0 % 43.8  41.6  39.2   Platelets 150 - 400 K/uL 156  193  164       Latest Ref Rng & Units 03/06/2023    2:11 PM 12/04/2022    9:56 AM 09/25/2022   11:18 AM  CMP  Glucose 70 - 99 mg/dL 124  103  127   BUN 8 - 23 mg/dL 12  16  16    Creatinine 0.61 - 1.24 mg/dL 1.17  1.11  1.06  Sodium 135 - 145 mmol/L 139  138  138   Potassium 3.5 - 5.1 mmol/L 4.7  4.3  4.4   Chloride 98 - 111 mmol/L 103  103  106   CO2 22 - 32 mmol/L 25  28  26    Calcium 8.9 - 10.3 mg/dL 9.3  9.3  9.2   Total Protein 6.5 - 8.1 g/dL 7.4  7.4  7.5   Total Bilirubin 0.3 - 1.2 mg/dL 0.5  0.4  0.7   Alkaline Phos 38 - 126 U/L 70  74  91   AST 15 - 41 U/L 22  19  18    ALT 0 - 44 U/L 22  19  17       No results found for: "CEA1", "CEA" / No results found for: "CEA1", "CEA" Lab Results  Component Value Date   PSA1 0.3 09/05/2022   No results found for: "[id number]" No results found for: "CAN125"  No results found for: "TOTALPROTELP", "ALBUMINELP", "A1GS", "A2GS", "BETS", "BETA2SER", "GAMS", "MSPIKE", "SPEI" No results found for: "TIBC",  "FERRITIN", "IRONPCTSAT" No results found for: "LDH"   STUDIES:   No results found.

## 2023-03-13 NOTE — Patient Instructions (Addendum)
Stephen Palmer  Discharge Instructions  You were seen and examined today by Dr. Delton Coombes.  Dr. Delton Coombes discussed your most recent lab work which revealed that everything looks good.   Start taking the Zytiga at night to see if it helps with sleepiness.  Follow-up as scheduled in 3 months with labs.    Thank you for choosing [facility] to provide your oncology and hematology care.   To afford each patient quality time with our provider, please arrive at least 15 minutes before your scheduled appointment time. You may need to reschedule your appointment if you arrive late (10 or more minutes). Arriving late affects you and other patients whose appointments are after yours.  Also, if you miss three or more appointments without notifying the office, you may be dismissed from the clinic at the provider's discretion.    Again, thank you for choosing [facility]  Our hope is that these requests will decrease the amount of time that you wait before being seen by our physicians.   If you have a lab appointment with the [facility] please come in thru the Main Entrance and check in at the main information desk.           _____________________________________________________________  Should you have questions after your visit to [facility], please contact our office at [phone] and follow the prompts.  Our office hours are 8:00 a.m. to 4:30 p.m. Monday - Thursday and 8:00 a.m. to 2:30 p.m. Friday.  Please note that voicemails left after 4:00 p.m. may not be returned until the following business day.  We are closed weekends and all major holidays.  You do have access to a nurse 24-7, just call the main number to the clinic [phone] and do not press any options, hold on the line and a nurse will answer the phone.    For prescription refill requests, have your pharmacy contact our office and allow 72 hours.     Masks are optional in the cancer centers. If you would like for your care team to wear a mask while they are taking care of you, please let them know. You may have one support person who is at least 72 years old accompany you for your appointments.

## 2023-03-14 ENCOUNTER — Ambulatory Visit (INDEPENDENT_AMBULATORY_CARE_PROVIDER_SITE_OTHER): Payer: Medicare Other | Admitting: Urology

## 2023-03-14 ENCOUNTER — Encounter: Payer: Self-pay | Admitting: Urology

## 2023-03-14 ENCOUNTER — Inpatient Hospital Stay: Payer: Medicare Other

## 2023-03-14 VITALS — BP 100/57 | HR 92

## 2023-03-14 DIAGNOSIS — R351 Nocturia: Secondary | ICD-10-CM | POA: Diagnosis not present

## 2023-03-14 DIAGNOSIS — N138 Other obstructive and reflux uropathy: Secondary | ICD-10-CM | POA: Diagnosis not present

## 2023-03-14 DIAGNOSIS — C775 Secondary and unspecified malignant neoplasm of intrapelvic lymph nodes: Secondary | ICD-10-CM

## 2023-03-14 DIAGNOSIS — N401 Enlarged prostate with lower urinary tract symptoms: Secondary | ICD-10-CM | POA: Diagnosis not present

## 2023-03-14 DIAGNOSIS — C61 Malignant neoplasm of prostate: Secondary | ICD-10-CM

## 2023-03-14 LAB — URINALYSIS, ROUTINE W REFLEX MICROSCOPIC
Bilirubin, UA: NEGATIVE
Glucose, UA: NEGATIVE
Leukocytes,UA: NEGATIVE
Nitrite, UA: NEGATIVE
RBC, UA: NEGATIVE
Specific Gravity, UA: 1.025 (ref 1.005–1.030)
Urobilinogen, Ur: 1 mg/dL (ref 0.2–1.0)
pH, UA: 6 (ref 5.0–7.5)

## 2023-03-14 LAB — MICROSCOPIC EXAMINATION: Bacteria, UA: NONE SEEN

## 2023-03-14 MED ORDER — LEUPROLIDE ACETATE (6 MONTH) 45 MG ~~LOC~~ KIT: 45.0000 mg | PACK | Freq: Once | SUBCUTANEOUS | Status: DC

## 2023-03-14 NOTE — Progress Notes (Unsigned)
03/14/2023 10:40 AM   Stephen Palmer 1951-06-27 [id number]  Referring provider: [name], PA-C [address] [place],  [place] [address]  Followup metastatic prostate cancer and BPH   HPI: Stephen Palmer is a 72yo here for followup for metastatic prostate cancer and BPH. He finished radiation for metastatic prostate cancer. He is on eligard and xytiga. PSA stable at 0.19. He has hot flashes and fatigue which is bothersome to him. IPSS 15 QOL 3 on uroxatral 10mg  qhs. Nocturia 1-3x.    PMH: Past Medical History:  Diagnosis Date   Abdominal aortic aneurysm (AAA) (HCC)    Anxiety    COPD (chronic obstructive pulmonary disease) (HCC)    Coronary artery calcification seen on CT scan    Essential hypertension    Lung cancer (HCC)    Non small cell carcinoma - XRT   Non-small cell carcinoma of left lung, stage 1 (HCC) 09/21/2017   Prostate cancer ([place])    XRT   Stroke ([place])    Type 2 diabetes mellitus ([place])     Surgical History: Past Surgical History:  Procedure Laterality Date   COLONOSCOPY N/A 11/23/2015   Procedure: COLONOSCOPY;  Surgeon: Aviva Signs, MD;  [facility];  Service: Gastroenterology;  Laterality: N/A;   FRACTURE SURGERY     PROSTATE BIOPSY     SINUS SURGERY WITH INSTATRAK      Home Medications:  Allergies as of 03/14/2023       Reactions   Sulfa Antibiotics Hives, Itching, Swelling   Tongue swelling   Sulfasalazine Hives, Itching, Swelling   Tongue swelling   Lipitor [atorvastatin] Other (See Comments)   myalgia   Simvastatin Other (See Comments)   myalgia   Iodinated Contrast Media Nausea And Vomiting        Medication List        Accurate as of March 14, 2023 10:40 AM. If you have any questions, ask your nurse or doctor.          abiraterone acetate 250 MG tablet Commonly known as: ZYTIGA Take 2 tablets (500 mg total) by mouth daily. Take on an empty stomach 1 hour before or 2 hours after a meal   Accu-Chek  Guide Me w/Device Kit 1 Piece by Does not apply route as directed.   Accu-Chek Guide test strip Generic drug: glucose blood Use to test glucose 4 times a day.   alfuzosin 10 MG 24 hr tablet Commonly known as: UROXATRAL Take 1 tablet (10 mg total) by mouth daily with breakfast.   alprazolam 2 MG tablet Commonly known as: XANAX Take 2 mg by mouth 2 (two) times daily.   citalopram 40 MG tablet Commonly known as: CELEXA Take 20-40 mg by mouth daily.   glipiZIDE 5 MG tablet Commonly known as: GLUCOTROL TAKE 1 TABLET BY MOUTH EVERY MORNING BEFORE BREAKFAST   multivitamin Tabs tablet Take 1 tablet by mouth daily.   predniSONE 10 MG tablet Commonly known as: DELTASONE Take 1 tablet (10 mg total) by mouth daily with breakfast.   rosuvastatin 10 MG tablet Commonly known as: CRESTOR Take 1 tablet (10 mg total) by mouth daily.        Allergies:  Allergies  Allergen Reactions   Sulfa Antibiotics Hives, Itching and Swelling    Tongue swelling   Sulfasalazine Hives, Itching and Swelling    Tongue swelling   Lipitor [Atorvastatin] Other (See Comments)    myalgia   Simvastatin Other (See Comments)  myalgia   Iodinated Contrast Media Nausea And Vomiting    Family History: Family History  Problem Relation Age of Onset   Heart disease Mother    Skin cancer Mother    Heart disease Father        before age 38   Skin cancer Brother    Breast cancer Maternal Aunt    Cancer Neg Hx     Social History:  reports that he has been smoking cigarettes. He has a 88.00 pack-year smoking history. He has never used smokeless tobacco. He reports that he does not drink alcohol and does not use drugs.  ROS: All other review of systems were reviewed and are negative except what is noted above in HPI  Physical Exam: BP (!) 100/57   Pulse 92   Constitutional:  Alert and oriented, No acute distress. HEENT: [place] AT, moist mucus membranes.  Trachea midline, no masses. Cardiovascular: No  clubbing, cyanosis, or edema. Respiratory: Normal respiratory effort, no increased work of breathing. GI: Abdomen is soft, nontender, nondistended, no abdominal masses GU: No CVA tenderness.  Lymph: No cervical or inguinal lymphadenopathy. Skin: No rashes, bruises or suspicious lesions. Neurologic: Grossly intact, no focal deficits, moving all 4 extremities. Psychiatric: Normal mood and affect.  Laboratory Data: Lab Results  Component Value Date   WBC 8.0 03/06/2023   HGB 14.5 03/06/2023   HCT 43.8 03/06/2023   MCV 99.5 03/06/2023   PLT 156 03/06/2023    Lab Results  Component Value Date   CREATININE 1.17 03/06/2023    Lab Results  Component Value Date   PSA 9.7 (H) 05/19/2020   PSA 8.7 (H) 05/11/2020    Lab Results  Component Value Date   TESTOSTERONE 166 (L) 05/11/2020    Lab Results  Component Value Date   HGBA1C 7.3 (A) 03/12/2023    Urinalysis    Component Value Date/Time   APPEARANCEUR Clear 09/12/2022 1141   GLUCOSEU Negative 09/12/2022 1141   BILIRUBINUR Negative 09/12/2022 1141   PROTEINUR Negative 09/12/2022 1141   NITRITE Negative 09/12/2022 1141   LEUKOCYTESUR Negative 09/12/2022 1141    Lab Results  Component Value Date   LABMICR Comment 09/12/2022    Pertinent Imaging: *** Results for orders placed during the hospital encounter of 05/14/08  DG Abd 1 View  Narrative Clinical Data: Left ureteral calculus.  ABDOMEN - 1 VIEW  Comparison: CT abdomen and pelvis 04/08/08.  Findings: No unexpected calcifications are seen over the renal shadows, expected course of the ureters or urinary bladder. Specifically, the small left UVJ stone seen the patient's CT scan is not visible on this study.  Bowel gas pattern is normal.  No focal bony abnormality.  IMPRESSION: Negative for plain film evidence of urinary tract calculi.  Provider: Jennye Boroughs, Brooke Dare  No results found for this or any previous visit.  No results found for  this or any previous visit.  No results found for this or any previous visit.  No results found for this or any previous visit.  No valid procedures specified. No results found for this or any previous visit.  No results found for this or any previous visit.   Assessment & Plan:    1. Prostate cancer metastatic to intrapelvic lymph node (HCC) *** - Urinalysis, Routine w reflex microscopic - leuprolide (6 Month) (ELIGARD) injection 45 mg  2. Benign prostatic hyperplasia with urinary obstruction ***  3. Nocturia ***   No follow-ups on file.  Nicolette Bang, MD  [facility]

## 2023-03-14 NOTE — Patient Instructions (Signed)

## 2023-03-20 ENCOUNTER — Other Ambulatory Visit (HOSPITAL_COMMUNITY): Payer: Self-pay

## 2023-03-21 ENCOUNTER — Telehealth: Payer: Self-pay

## 2023-03-21 NOTE — Telephone Encounter (Signed)
Pt called stating since taking chemo treatment zytiga he has noticed his BG readings elevated.  BG readings.   Date Before breakfast Before lunch Before supper Bedtime  03/19/23 125   165 pt states he took an extra glipizide 5mg   03/20/23 101   116 pt states he took an extra glipizide 5mg   03/21/23 76             Pt taking: glipizide 5mg  qd.

## 2023-03-21 NOTE — Telephone Encounter (Signed)
Spoke with pt, advised him he does not need the additional glipizide, to continue taking glipizide 5mg  daily at breakfast per Dr.Nida's orders. Understanding voiced.

## 2023-03-23 ENCOUNTER — Other Ambulatory Visit (HOSPITAL_COMMUNITY): Payer: Self-pay

## 2023-03-28 ENCOUNTER — Telehealth: Payer: Self-pay

## 2023-03-28 ENCOUNTER — Other Ambulatory Visit (HOSPITAL_COMMUNITY): Payer: Self-pay

## 2023-03-28 NOTE — Telephone Encounter (Signed)
Oral Oncology Patient Advocate Encounter   Was successful in securing patient an $3,250 grant from [employer] [facility]) to provide copayment coverage for Abiraterone.  This will keep the out of pocket expense at $0.     The billing information is as follows and has been shared with [facility].   Member ID: [id number] Group ID: [id number] RxBin: [id number] Dates of Eligibility: 12/27/22 through 03/25/24  Fund:  Prostate Cancer   Ardeen Fillers, CPhT Oncology Pharmacy Patient Advocate  [facility]  [phone] (phone) [phone] (fax) 03/28/2023 9:33 AM

## 2023-04-10 ENCOUNTER — Other Ambulatory Visit: Payer: Self-pay

## 2023-04-10 ENCOUNTER — Other Ambulatory Visit: Payer: Self-pay | Admitting: Hematology

## 2023-04-10 ENCOUNTER — Other Ambulatory Visit (HOSPITAL_COMMUNITY): Payer: Self-pay

## 2023-04-10 ENCOUNTER — Other Ambulatory Visit: Payer: Self-pay | Admitting: *Deleted

## 2023-04-10 DIAGNOSIS — C775 Secondary and unspecified malignant neoplasm of intrapelvic lymph nodes: Secondary | ICD-10-CM

## 2023-04-10 MED ORDER — ABIRATERONE ACETATE 250 MG PO TABS
500.0000 mg | ORAL_TABLET | Freq: Every day | ORAL | 2 refills | Status: DC
  Filled 2023-04-10: qty 60, 30d supply, fill #0
  Filled 2023-06-12: qty 60, 30d supply, fill #1
  Filled 2023-07-26: qty 60, 30d supply, fill #2

## 2023-04-10 NOTE — Telephone Encounter (Signed)
Zytiga refill approved.  Patient tolerating and is to continue therapy. 

## 2023-04-11 ENCOUNTER — Other Ambulatory Visit (HOSPITAL_COMMUNITY): Payer: Self-pay

## 2023-04-19 ENCOUNTER — Ambulatory Visit: Payer: Medicare Other | Admitting: Urology

## 2023-04-23 DIAGNOSIS — Z6823 Body mass index (BMI) 23.0-23.9, adult: Secondary | ICD-10-CM | POA: Diagnosis not present

## 2023-04-23 DIAGNOSIS — F419 Anxiety disorder, unspecified: Secondary | ICD-10-CM | POA: Diagnosis not present

## 2023-04-24 ENCOUNTER — Other Ambulatory Visit: Payer: Self-pay | Admitting: Hematology

## 2023-04-25 ENCOUNTER — Ambulatory Visit: Payer: Medicare Other | Admitting: Urology

## 2023-04-26 ENCOUNTER — Encounter (HOSPITAL_COMMUNITY)
Admission: RE | Admit: 2023-04-26 | Discharge: 2023-04-26 | Disposition: A | Discharge status: Home / Self Care | Payer: Medicare Other | Source: Ambulatory Visit | Attending: Urology | Admitting: Urology

## 2023-04-26 DIAGNOSIS — C61 Malignant neoplasm of prostate: Secondary | ICD-10-CM | POA: Insufficient documentation

## 2023-04-26 DIAGNOSIS — C775 Secondary and unspecified malignant neoplasm of intrapelvic lymph nodes: Secondary | ICD-10-CM | POA: Diagnosis not present

## 2023-04-26 DIAGNOSIS — C7951 Secondary malignant neoplasm of bone: Secondary | ICD-10-CM | POA: Diagnosis not present

## 2023-04-26 MED ORDER — PIFLIFOLASTAT F 18 (PYLARIFY) INJECTION
9.0000 | Freq: Once | INTRAVENOUS | Status: AC
  Administered 2023-04-26: 9.5 via INTRAVENOUS

## 2023-05-01 ENCOUNTER — Encounter: Payer: Self-pay | Admitting: Urology

## 2023-05-01 NOTE — Progress Notes (Signed)
Telephone nursing appointment for patient to review most recent scan results from 05/01/2023. I verified patient's identity and began nursing interview. Patient reports no discomfort. No issues conveyed at this time.   Meaningful use complete.   Patient aware of their 11:30am-05/02/2023 telephone appointment w/ Ashlyn Bruning PA-C. I left my extension [phone] in case patient needs anything. Patient verbalized understanding. This concludes the nursing interview.   Patient contact #[phone]     Ruel Favors, LPN

## 2023-05-02 ENCOUNTER — Ambulatory Visit
Admission: RE | Admit: 2023-05-02 | Discharge: 2023-05-02 | Disposition: A | Discharge status: Home / Self Care | Payer: Medicare Other | Source: Ambulatory Visit | Attending: Urology | Admitting: Urology

## 2023-05-02 DIAGNOSIS — C61 Malignant neoplasm of prostate: Secondary | ICD-10-CM

## 2023-05-02 DIAGNOSIS — C771 Secondary and unspecified malignant neoplasm of intrathoracic lymph nodes: Secondary | ICD-10-CM | POA: Diagnosis not present

## 2023-05-02 DIAGNOSIS — C772 Secondary and unspecified malignant neoplasm of intra-abdominal lymph nodes: Secondary | ICD-10-CM | POA: Diagnosis not present

## 2023-05-02 DIAGNOSIS — C3492 Malignant neoplasm of unspecified part of left bronchus or lung: Secondary | ICD-10-CM

## 2023-05-02 DIAGNOSIS — Z192 Hormone resistant malignancy status: Secondary | ICD-10-CM | POA: Diagnosis not present

## 2023-05-03 ENCOUNTER — Other Ambulatory Visit (HOSPITAL_COMMUNITY): Payer: Self-pay

## 2023-05-03 ENCOUNTER — Other Ambulatory Visit: Payer: Self-pay

## 2023-05-03 NOTE — Progress Notes (Signed)
Radiation Oncology         [phone]) [phone] ________________________________  Name: Stephen Palmer. MRN: [id number]  Date: 05/02/2023  DOB: 1951/01/30  Post Treatment Note  CC: Alyssa Grove, MD  Diagnosis:   72 y.o. male with h/o NSCLC and prostate cancer with oligometastasis to abdominal, pelvic and supraclavicular lymph nodes.      Interval Since Last Radiation:  11 months  05/30/22 - 06/12/22//UHRT pelvic nodes   06/21/22 - 06/30/22//SBRT left supraclavicular node   Curative, Definitive SBRT lung:  10/05/2017, 10/09/2017, 10/12/2017- The target in the LUL lung was treated to 54 Gy in 3 fractions of 18 Gy.   12/13/16 - 02/09/17: Prostate IMRT ; 40 fractions to 78 Gy in combination with LT-ADT under the care of Dr. Vennie Homans in [place], [place].  Narrative:  I spoke with the patient to conduct his routine scheduled  month follow up visit via telephone to spare the patient unnecessary potential exposure in the healthcare setting during the current COVID-19 pandemic.  The patient was notified in advance and gave permission to proceed with this visit format. He tolerated radiation treatment well and did not experience any ill side effects aside from fatigue.     I spoke with the patient to conduct his routine scheduled annual follow up visit via telephone to spare the patient unnecessary potential exposure in the healthcare setting during the current COVID-19 pandemic.  The patient was notified in advance and gave permission to proceed with this visit format.                              In summary, he was initially diagnosed with Stage I NSCLC, squamous cell carcinoma, after a 3 cm spiculated mass in the LUL was identified on CT scan in the ED following a fall at home. PET scan and MRI brain for disease staging were without evidence of metastatic disease. He elected to proceed with stereotactic body radiotherapy (SBRT) to the LUL lung lesion as he was not felt to be a surgical  candidate due to his severe emphysema and other medical comorbidities.  His treatment was completed in October 2018 and he tolerated this very well.  Just prior to this treatment, he had recently completed definitive prostate IMRT under the care and direction of Dr. Vennie Homans in [place], [place], for treatment of the high risk, Gleason 4+5 prostate adenocarcinoma with a pretreatment PSA of 8.1.  His posttreatment PSA was 0.21 and he has continued in close follow-up under the care and direction of Dr. Ronne Binning and Dr. Ellin Saba.  We have continued to follow him closely with serial CT Chest scans which have remained without any evidence of disease recurrence of the squamous cell lung cancer, most recent scan was 10/24/2021. He had a PSMA PET scan recently for restaging of his prostate cancer so we elected to forego the Chest CT this year since this imaging did not show any concerning lung nodules or thoracic lymphadenopathy.  Unfortunately, he was noted to have a rising PSA in 02/2022 when his PSA increased from 0.3 in 08/2021 to 3.9 and remained elevated at 4.4 on repeat labs 03/10/22, despite ADT. This prompted further evaluation with a PSMA PET scan which was performed 03/30/22 and showed tracer avid nodes within the abdominal retroperitoneum, right pelvis, and left low jugular/supraclavicular stations, most consistent with nodal metastasis. The low left jugular/supraclavicular nodes were favored to be from prostate  primary while nodal metastasis from the patient's lung primary was felt less likely. There was no evidence of local recurrence or osseous metastasis identified.   He was started on Xtandi, in addition to the Lupron ADT in 03/2022, under the care and direction of Dr. Ronne Binning. He experienced significant fatigue/malaise with full dose Xtandi so he was switched to Dixon. Unfortunately, he did not tolerate the Erleada due to severe fatigue as well. In reviewing his PSMA PET from 03/30/22 to evaluate for  any evidence of lung cancer recurrence, we noted the prostate cancer recurrence in the pelvic and retroperitoneal nodes and in reviewing the prior prostate IMRT treatment plans from [facility], it was noted that the pelvic nodes were not included in the original treatment. Therefore, we discussed consideration for a 10-fraction course of UHRT to the pelvic and retroperitoneal nodes to boost local control and help with PSA response.  At the same time, we recommended treating the left supraclavicular node with a 5 fraction course of SBRT.  This was discussed with the patient at the time of follow-up on 04/26/2022 and the patient was in agreement to proceed.  His most recent course of radiation to the abdominal/pelvic lymph nodes and left supraclavicular lymph node was completed in July 2023 and he tolerated the treatments very well.  He established care with Dr. Ellin Saba in 06/2022 and was switched to Zytiga/prednisone on 07/06/2022 since he has not previously tolerated Xtandi or Learned.  He feels like he is tolerating the Zytiga much better although he does continue with some fatigue despite dose reduction to 250 mg daily.  He has been off Lupron for the past 6 months and PSA has remained stable at 0.19 since 11/2022 with most recent labs 03/07/23. However, his recent PSMA PET from 04/26/23 shows two new intensely radiotracer avid skeletal metastasis in the RIGHT frontal bone and RIGHT transverse process of the T9 vertebral body. There is persistent activity in the right external iliac node but on comparison to pretreatment PSMA from 03/2023, this appears smaller and less avid, indicating a response to the SBRT. The other treated nodes in the chest, abdomen and pelvis are improved as well. The infrarenal AAA is stable in size at 5.6 cm.  On review of systems, the patient states that he is doing fair in general.  He specifically denies any dysphagia, chest pain, productive cough, hemoptysis, dyspnea, increased  shortness of breath, fever, chills or night sweats.  He denies LUTS, abdominal pain, nausea, vomiting, diarrhea or constipation. He denies any new musculoskeletal or joint pains. Hewill be due for a follow up visit with Dr. Ellin Saba in 05/2023 and is scheduled for repeat labs with Dr. Ronne Binning on 06/13/23.   ALLERGIES:  is allergic to sulfa antibiotics, sulfasalazine, lipitor [atorvastatin], simvastatin, and iodinated contrast media.  Meds: Current Outpatient Medications  Medication Sig Dispense Refill   abiraterone acetate (ZYTIGA) 250 MG tablet Take 2 tablets (500 mg total) by mouth daily. Take on an empty stomach 1 hour before or 2 hours after a meal 60 tablet 2   alfuzosin (UROXATRAL) 10 MG 24 hr tablet Take 1 tablet (10 mg total) by mouth daily with breakfast. 30 tablet 11   alprazolam (XANAX) 2 MG tablet Take 2 mg by mouth 2 (two) times daily.     Blood Glucose Monitoring Suppl (ACCU-CHEK GUIDE ME) w/Device KIT 1 Piece by Does not apply route as directed. 1 kit 0   citalopram (CELEXA) 40 MG tablet Take 20-40 mg by mouth daily.  glipiZIDE (GLUCOTROL) 5 MG tablet TAKE 1 TABLET BY MOUTH EVERY MORNING BEFORE BREAKFAST 90 tablet 0   glucose blood (ACCU-CHEK GUIDE) test strip Use to test glucose 4 times a day. 200 each 2   multivitamin (ONE-A-DAY MEN'S) TABS tablet Take 1 tablet by mouth daily.     predniSONE (DELTASONE) 10 MG tablet TAKE 1 TABLET BY MOUTH DAILY WITH breakfast 30 tablet 6   rosuvastatin (CRESTOR) 10 MG tablet Take 1 tablet (10 mg total) by mouth daily. 90 tablet 3   No current facility-administered medications for this encounter.    Physical Findings:  vitals were not taken for this visit.  Pain Assessment Pain Score: 0-No pain/10 To assess due to telephone follow-up visit format.  Lab Findings: Lab Results  Component Value Date   WBC 8.0 03/06/2023   HGB 14.5 03/06/2023   HCT 43.8 03/06/2023   MCV 99.5 03/06/2023   PLT 156 03/06/2023     Radiographic  Findings: NM PET (PSMA) SKULL TO MID THIGH  Result Date: 05/01/2023 CLINICAL DATA:  Prostate cancer. Initial diagnosis 2022. Radiation 2023. Ongoing chemotherapy. EXAM: NUCLEAR MEDICINE PET SKULL BASE TO THIGH TECHNIQUE: 9.5 mCi F18 Piflufolastat (Pylarify) was injected intravenously. Full-ring PET imaging was performed from the skull base to thigh after the radiotracer. CT data was obtained and used for attenuation correction and anatomic localization. COMPARISON:  PSMA PET scan 03/30/2022 FINDINGS: NECK No radiotracer activity in neck lymph nodes. Incidental CT finding: None. CHEST No radiotracer avid supraclavicular nodes or mediastinal nodes. Number resolution previously described LEFT supraclavicular nodes. Band of consolidation in the LEFT upper lobe without radiotracer activity suggest post radiation or post infectious findings. Incidental CT finding: No suspicious pulmonary nodules. Centrilobular emphysema the upper lobes. ABDOMEN/PELVIS Prostate: No focal activity the prostate gland. Fiducial markers noted adjacent to the gland. Lymph nodes: Intense radiotracer avid RIGHT external iliac lymph node again noted. Lymph node measures 13 mm compared to 13 mm. Nodule is intensely radiotracer avid on comparison exam. Smaller previous described periaortic radiotracer avid lymph nodes are less in number. One node remains along the RIGHT aspect of the aorta just above the bifurcation with SUV max equal 5.6 on image 108. Node difficult define on the CT portion exam. Liver: No evidence of liver metastasis. Incidental CT finding: Aneurysmal dilatation of the infrarenal abdominal aorta to 5.6 by 5.6 cm which compares to 5.6 x 5.6 cm on comparison exam SKELETON There are 2 new foci of intense radiotracer avid skeletal metastasis. One in the anteromedial RIGHT frontal bone with SUV max equal 15 on image 42 of the fused data set. No clear CT correlation . Second lesion in the RIGHT transverse process of the T9 vertebral  body activity is intense with SUV max equal 41. No CT correlation. IMPRESSION: 1. Two new intensely radiotracer avid skeletal metastasis in the RIGHT frontal bone and RIGHT transverse process of the T9 vertebral body. 2. Persistent radiotracer avid RIGHT external iliac lymph node. 3. Smaller periaortic radiotracer avid lymph nodes are less in number than comparison exam. Resolution of LEFT supraclavicular nodal disease. 4. No evidence of local prostate cancer recurrence in the prostate gland. 5. Infrarenal abdominal aortic aneurysm measures up to 5.6 cm. Recommend referral to a vascular specialist if not already obtained. Reference: J Am Coll Radiol 2013;10:789-794. 6.  Emphysema (ICD10-J43.9). Electronically Signed   By: Genevive Bi M.D.   On: 05/01/2023 16:57    Impression/Plan: 1. 72 y.o. male with h/o NSCLC and oligometastatic castrate-sensitive prostate cancer.  He appears to have recovered well from the effects of radiation and is currently without complaints.  We reviewed his recent PSMA PET which unfortunately does show two new intensely radiotracer avid skeletal metastasis in the RIGHT frontal bone and RIGHT transverse process of the T9 vertebral body. There is persistent activity in the right external iliac node but on comparison to pretreatment PSMA from 03/2023, this appears smaller and less avid, indicating a response to the SBRT. The other treated nodes in the chest, abdomen and pelvis are improved as well. The infrarenal AAA is stable in size at 5.6 cm.  He is not having any associated pain with the skeletal metastases so there is not any current role for further radiation. However, he knows that should he develop any painful sites of osseous metastases in the future, we would be more than happy to offer palliative radiation to those sites.  Otherwise, we discussed that while we are happy to continue to participate in his care if clinically indicated, at this point, with regards to the  prostate cancer we will plan to see him back on an as-needed basis.  He will continue in routine follow-up under the care and direction of Dr. Ellin Saba and Dr. Ronne Binning but knows that he is welcome to call anytime with any questions or concerns related to his previous radiation.  2. Stage I, NSCLC, squamous cell carcinoma of the left upper lobe lung.  We will plan to continue to monitor this closely with serial CT chest scans annually and I will plan to call him following each scan to review the results and any recommendations.  He has established care with Dr. Ellin Saba at [facility] so it is possible that Dr. Kirtland Bouchard will take over the follow-up of both the metastatic prostate cancer and the lung cancer. We will plan to stay in close communication with his team and are happy to continue to monitor the lung cancer but want to make every effort to avoid any duplication of efforts.  At this time, our plan will be to proceed with repeat CT chest in May 2025 and a follow-up telephone visit thereafter unless he has had additional imaging with Dr. Ellin Saba that is sufficient for evaluation of the chest.  He knows that he is welcome to call at anytime in the interim with any questions or concerns related to his previous radiation.   I personally spent 30 minutes in this encounter including chart review, reviewing radiological studies, telephone conversation with the patient, entering orders and completing documentation.    Marguarite Arbour, PA-C

## 2023-05-24 ENCOUNTER — Other Ambulatory Visit: Payer: Self-pay | Admitting: "Endocrinology

## 2023-05-28 ENCOUNTER — Other Ambulatory Visit (HOSPITAL_COMMUNITY): Payer: Self-pay

## 2023-06-12 ENCOUNTER — Inpatient Hospital Stay: Payer: Medicare Other | Attending: Genetics

## 2023-06-12 ENCOUNTER — Other Ambulatory Visit (HOSPITAL_COMMUNITY): Payer: Self-pay

## 2023-06-12 DIAGNOSIS — C61 Malignant neoplasm of prostate: Secondary | ICD-10-CM

## 2023-06-12 DIAGNOSIS — Z8546 Personal history of malignant neoplasm of prostate: Secondary | ICD-10-CM | POA: Insufficient documentation

## 2023-06-12 LAB — CBC WITH DIFFERENTIAL/PLATELET
Abs Immature Granulocytes: 0.05 10*3/uL (ref 0.00–0.07)
Basophils Absolute: 0 10*3/uL (ref 0.0–0.1)
Basophils Relative: 0 %
Eosinophils Absolute: 0.2 10*3/uL (ref 0.0–0.5)
Eosinophils Relative: 2 %
HCT: 44.4 % (ref 39.0–52.0)
Hemoglobin: 14.6 g/dL (ref 13.0–17.0)
Immature Granulocytes: 1 %
Lymphocytes Relative: 13 %
Lymphs Abs: 1 10*3/uL (ref 0.7–4.0)
MCH: 32.2 pg (ref 26.0–34.0)
MCHC: 32.9 g/dL (ref 30.0–36.0)
MCV: 98 fL (ref 80.0–100.0)
Monocytes Absolute: 0.6 10*3/uL (ref 0.1–1.0)
Monocytes Relative: 7 %
Neutro Abs: 5.9 10*3/uL (ref 1.7–7.7)
Neutrophils Relative %: 77 %
Platelets: 192 10*3/uL (ref 150–400)
RBC: 4.53 MIL/uL (ref 4.22–5.81)
RDW: 13.3 % (ref 11.5–15.5)
WBC: 7.7 10*3/uL (ref 4.0–10.5)
nRBC: 0 % (ref 0.0–0.2)

## 2023-06-12 LAB — COMPREHENSIVE METABOLIC PANEL
ALT: 27 U/L (ref 0–44)
AST: 18 U/L (ref 15–41)
Albumin: 4.3 g/dL (ref 3.5–5.0)
Alkaline Phosphatase: 61 U/L (ref 38–126)
Anion gap: 9 (ref 5–15)
BUN: 21 mg/dL (ref 8–23)
CO2: 27 mmol/L (ref 22–32)
Calcium: 9.2 mg/dL (ref 8.9–10.3)
Chloride: 101 mmol/L (ref 98–111)
Creatinine, Ser: 1.17 mg/dL (ref 0.61–1.24)
GFR, Estimated: >60 mL/min (ref >60)
Glucose, Bld: 115 mg/dL — ABNORMAL HIGH (ref 70–99)
Potassium: 4.3 mmol/L (ref 3.5–5.1)
Sodium: 137 mmol/L (ref 135–145)
Total Bilirubin: 0.7 mg/dL (ref 0.3–1.2)
Total Protein: 6.9 g/dL (ref 6.5–8.1)

## 2023-06-12 LAB — PSA: Prostatic Specific Antigen: 0.39 ng/mL (ref 0.00–4.00)

## 2023-06-13 ENCOUNTER — Other Ambulatory Visit: Payer: Medicare Other

## 2023-06-15 ENCOUNTER — Ambulatory Visit (INDEPENDENT_AMBULATORY_CARE_PROVIDER_SITE_OTHER): Payer: Medicare Other | Admitting: Urology

## 2023-06-15 ENCOUNTER — Other Ambulatory Visit (HOSPITAL_COMMUNITY): Payer: Self-pay

## 2023-06-15 VITALS — BP 125/76 | HR 78

## 2023-06-15 DIAGNOSIS — N401 Enlarged prostate with lower urinary tract symptoms: Secondary | ICD-10-CM | POA: Diagnosis not present

## 2023-06-15 DIAGNOSIS — R351 Nocturia: Secondary | ICD-10-CM

## 2023-06-15 DIAGNOSIS — C61 Malignant neoplasm of prostate: Secondary | ICD-10-CM | POA: Diagnosis not present

## 2023-06-15 DIAGNOSIS — C775 Secondary and unspecified malignant neoplasm of intrapelvic lymph nodes: Secondary | ICD-10-CM

## 2023-06-15 DIAGNOSIS — N138 Other obstructive and reflux uropathy: Secondary | ICD-10-CM

## 2023-06-15 LAB — URINALYSIS, ROUTINE W REFLEX MICROSCOPIC
Bilirubin, UA: NEGATIVE
Glucose, UA: NEGATIVE
Ketones, UA: NEGATIVE
Leukocytes,UA: NEGATIVE
Nitrite, UA: NEGATIVE
Protein,UA: NEGATIVE
RBC, UA: NEGATIVE
Specific Gravity, UA: <1.005 — ABNORMAL LOW (ref 1.005–1.030)
Urobilinogen, Ur: 0.2 mg/dL (ref 0.2–1.0)
pH, UA: 6 (ref 5.0–7.5)

## 2023-06-15 MED ORDER — ALFUZOSIN HCL ER 10 MG PO TB24: 10.0000 mg | ORAL_TABLET | Freq: Every day | ORAL | 11 refills | Status: DC

## 2023-06-15 NOTE — Progress Notes (Unsigned)
06/15/2023 11:54 AM   Stephen Palmer 27-Aug-1951 [id number]  Referring provider: Avis Epley, PA-C [address] [place],  [place] [address]  Followup prostate cancer   HPI: Stephen Palmer is a 72yo here for followup for metastatic prostate cancer. PSA increased to 0.39 from 0.19. He has fatigue which bothers him. He has hot flashes. He is due for eligard in 3 months. IPSS 9 QOl 3 on uroxatral 10mg  daily. Nocturia 1-3x. Urine stream strong. No straining to urinate.   PMH: Past Medical History:  Diagnosis Date   Abdominal aortic aneurysm (AAA) (HCC)    Anxiety    COPD (chronic obstructive pulmonary disease) (HCC)    Coronary artery calcification seen on CT scan    Essential hypertension    Lung cancer (HCC)    Non small cell carcinoma - XRT   Non-small cell carcinoma of left lung, stage 1 (HCC) 09/21/2017   Prostate cancer (HCC)    XRT   Stroke (HCC)    Type 2 diabetes mellitus (HCC)     Surgical History: Past Surgical History:  Procedure Laterality Date   COLONOSCOPY N/A 11/23/2015   Procedure: COLONOSCOPY;  Surgeon: Franky Macho, MD;  Location: [facility];  Service: Gastroenterology;  Laterality: N/A;   FRACTURE SURGERY     PROSTATE BIOPSY     SINUS SURGERY WITH INSTATRAK      Home Medications:  Allergies as of 06/15/2023       Reactions   Sulfa Antibiotics Hives, Itching, Swelling   Tongue swelling   Sulfasalazine Hives, Itching, Swelling   Tongue swelling   Lipitor [atorvastatin] Other (See Comments)   myalgia   Simvastatin Other (See Comments)   myalgia   Iodinated Contrast Media Nausea And Vomiting        Medication List        Accurate as of June 15, 2023 11:54 AM. If you have any questions, ask your nurse or doctor.          abiraterone acetate 250 MG tablet Commonly known as: ZYTIGA Take 2 tablets (500 mg total) by mouth daily. Take on an empty stomach 1 hour before or 2 hours after a meal   Accu-Chek Guide Me w/Device  Kit 1 Piece by Does not apply route as directed.   Accu-Chek Guide test strip Generic drug: glucose blood Use to test glucose 4 times a day.   alfuzosin 10 MG 24 hr tablet Commonly known as: UROXATRAL Take 1 tablet (10 mg total) by mouth daily with breakfast.   alprazolam 2 MG tablet Commonly known as: XANAX Take 2 mg by mouth 2 (two) times daily.   citalopram 40 MG tablet Commonly known as: CELEXA Take 20-40 mg by mouth daily.   glipiZIDE 5 MG tablet Commonly known as: GLUCOTROL TAKE 1 TABLET BY MOUTH EVERY MORNING BEFORE BREAKFAST   multivitamin Tabs tablet Take 1 tablet by mouth daily.   predniSONE 10 MG tablet Commonly known as: DELTASONE TAKE 1 TABLET BY MOUTH DAILY WITH breakfast   rosuvastatin 10 MG tablet Commonly known as: CRESTOR Take 1 tablet (10 mg total) by mouth daily.        Allergies:  Allergies  Allergen Reactions   Sulfa Antibiotics Hives, Itching and Swelling    Tongue swelling   Sulfasalazine Hives, Itching and Swelling    Tongue swelling   Lipitor [Atorvastatin] Other (See Comments)    myalgia   Simvastatin Other (See Comments)    myalgia   Iodinated Contrast Media  Nausea And Vomiting    Family History: Family History  Problem Relation Age of Onset   Heart disease Mother    Skin cancer Mother    Heart disease Father        before age 29   Skin cancer Brother    Breast cancer Maternal Aunt    Cancer Neg Hx     Social History:  reports that he has been smoking cigarettes. He has a 88.00 pack-year smoking history. He has never used smokeless tobacco. He reports that he does not drink alcohol and does not use drugs.  ROS: All other review of systems were reviewed and are negative except what is noted above in HPI  Physical Exam: BP 125/76   Pulse 78   Constitutional:  Alert and oriented, No acute distress. HEENT: [place] AT, moist mucus membranes.  Trachea midline, no masses. Cardiovascular: No clubbing, cyanosis, or  edema. Respiratory: Normal respiratory effort, no increased work of breathing. GI: Abdomen is soft, nontender, nondistended, no abdominal masses GU: No CVA tenderness.  Lymph: No cervical or inguinal lymphadenopathy. Skin: No rashes, bruises or suspicious lesions. Neurologic: Grossly intact, no focal deficits, moving all 4 extremities. Psychiatric: Normal mood and affect.  Laboratory Data: Lab Results  Component Value Date   WBC 7.7 06/12/2023   HGB 14.6 06/12/2023   HCT 44.4 06/12/2023   MCV 98.0 06/12/2023   PLT 192 06/12/2023    Lab Results  Component Value Date   CREATININE 1.17 06/12/2023    Lab Results  Component Value Date   PSA 9.7 (H) 05/19/2020   PSA 8.7 (H) 05/11/2020    Lab Results  Component Value Date   TESTOSTERONE 166 (L) 05/11/2020    Lab Results  Component Value Date   HGBA1C 7.3 (A) 03/12/2023    Urinalysis    Component Value Date/Time   APPEARANCEUR Clear 03/14/2023 1010   GLUCOSEU Negative 03/14/2023 1010   BILIRUBINUR Negative 03/14/2023 1010   PROTEINUR 1+ (A) 03/14/2023 1010   NITRITE Negative 03/14/2023 1010   LEUKOCYTESUR Negative 03/14/2023 1010    Lab Results  Component Value Date   LABMICR See below: 03/14/2023   WBCUA 0-5 03/14/2023   LABEPIT 0-10 03/14/2023   BACTERIA None seen 03/14/2023    Pertinent Imaging:  Results for orders placed during the hospital encounter of 05/14/08  DG Abd 1 View  Narrative Clinical Data: Left ureteral calculus.  ABDOMEN - 1 VIEW  Comparison: CT abdomen and pelvis 04/08/08.  Findings: No unexpected calcifications are seen over the renal shadows, expected course of the ureters or urinary bladder. Specifically, the small left UVJ stone seen the patient's CT scan is not visible on this study.  Bowel gas pattern is normal.  No focal bony abnormality.  IMPRESSION: Negative for plain film evidence of urinary tract calculi.  Provider: Wilkie Aye, Hedda Slade  No results  found for this or any previous visit.  No results found for this or any previous visit.  No results found for this or any previous visit.  No results found for this or any previous visit.  No valid procedures specified. No results found for this or any previous visit.  No results found for this or any previous visit.   Assessment & Plan:    1. Prostate cancer metastatic to intrapelvic lymph node (HCC) -followup 3 months with a PSA - Urinalysis, Routine w reflex microscopic  2. Benign prostatic hyperplasia with urinary obstruction -Uroxatral 10mg  qhs  3. Nocturia -uroxatral 10mg  qhs  No follow-ups on file.  Nicolette Bang, MD  [facility] [place]

## 2023-06-17 ENCOUNTER — Encounter: Payer: Self-pay | Admitting: Urology

## 2023-06-17 NOTE — Patient Instructions (Signed)

## 2023-06-20 ENCOUNTER — Inpatient Hospital Stay: Payer: Medicare Other | Attending: Genetics | Admitting: Hematology

## 2023-06-20 ENCOUNTER — Encounter: Payer: Self-pay | Admitting: Hematology

## 2023-06-20 VITALS — BP 100/74 | HR 95 | Temp 97.8°F | Resp 18 | Wt 151.3 lb

## 2023-06-20 DIAGNOSIS — Z8546 Personal history of malignant neoplasm of prostate: Secondary | ICD-10-CM | POA: Insufficient documentation

## 2023-06-20 DIAGNOSIS — F17210 Nicotine dependence, cigarettes, uncomplicated: Secondary | ICD-10-CM | POA: Diagnosis not present

## 2023-06-20 DIAGNOSIS — C775 Secondary and unspecified malignant neoplasm of intrapelvic lymph nodes: Secondary | ICD-10-CM

## 2023-06-20 DIAGNOSIS — Z9221 Personal history of antineoplastic chemotherapy: Secondary | ICD-10-CM | POA: Diagnosis not present

## 2023-06-20 DIAGNOSIS — M8580 Other specified disorders of bone density and structure, unspecified site: Secondary | ICD-10-CM | POA: Diagnosis not present

## 2023-06-20 DIAGNOSIS — Z923 Personal history of irradiation: Secondary | ICD-10-CM | POA: Insufficient documentation

## 2023-06-20 DIAGNOSIS — C61 Malignant neoplasm of prostate: Secondary | ICD-10-CM | POA: Diagnosis not present

## 2023-06-20 NOTE — Patient Instructions (Addendum)
[facility]  Discharge Instructions  You were seen and examined today by Dr. Ellin Saba.  Dr. Ellin Saba discussed your most recent lab work and PET scan which revealed that you have a couple of new spots on the scan one is on your back and the other is on your right frontal skull area.  Get your Eligard injection. Continue taking the 250 mg of Zytiga.  Follow-up as scheduled in 3 months.    Thank you for choosing [facility] to provide your oncology and hematology care.   To afford each patient quality time with our provider, please arrive at least 15 minutes before your scheduled appointment time. You may need to reschedule your appointment if you arrive late (10 or more minutes). Arriving late affects you and other patients whose appointments are after yours.  Also, if you miss three or more appointments without notifying the office, you may be dismissed from the clinic at the provider's discretion.    Again, thank you for choosing [facility].  Our hope is that these requests will decrease the amount of time that you wait before being seen by our physicians.   If you have a lab appointment with the Cancer Center - please note that after April 8th, all labs will be drawn in the cancer center.  You do not have to check in or register with the main entrance as you have in the past but will complete your check-in at the cancer center.            _____________________________________________________________  Should you have questions after your visit to [facility], please contact our office at [phone] and follow the prompts.  Our office hours are 8:00 a.m. to 4:30 p.m. Monday - Thursday and 8:00 a.m. to 2:30 p.m. Friday.  Please note that voicemails left after 4:00 p.m. may not be returned until the following business day.  We are closed weekends and all major holidays.  You do have access to a nurse 24-7,  just call the main number to the clinic [phone] and do not press any options, hold on the line and a nurse will answer the phone.    For prescription refill requests, have your pharmacy contact our office and allow 72 hours.    Masks are no longer required in the cancer centers. If you would like for your care team to wear a mask while they are taking care of you, please let them know. You may have one support person who is at least 72 years old accompany you for your appointments.

## 2023-06-20 NOTE — Progress Notes (Signed)
[facility] 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 - Prostate IMRT from 12/13/2016 through 02/09/2017, 78 Gray in 40 fractions with ADT. - Lupron started back on 09/01/2020 for rising PSA levels. - PSMA PET scan (03/30/2022): Nodes in the retroperitoneum, right pelvis, left lower jugular/supraclavicular stations.  No bone metastasis.  No local recurrence. - Enzalutamide started on 04/03/2022, discontinued due to extreme fatigue, vision changes and jaw pain. - Apalutamide started on 05/01/2022, taking only 2 pills but experiences jaw pain and neck pain and vision changes.  Discontinued on 05/09/2022 due to poor tolerance.       - XRT with Dr. Kathrynn Running from 05/30/2022 through 06/30/2022.       -Abiraterone 1000 mg and prednisone 5 mg started on 07/06/2022, dose reduced to 750 mg daily, dose further reduced to 500 mg daily on 12/12/2022 due to severe fatigue.    Social/family history: - Lives at home with his wife.  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 - Current active smoker, 2 packs/day for 55 years.  Rolls his own cigarettes. - Mother had skin cancers.  3.  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 - Definitive SBRT from 10/05/2017 through 10/12/2017, 54 Gray in 3 fractions of 18 Gray.  Plan: Metastatic CRPC to the lymph nodes: - He is taking  Abiraterone 250 mg daily and prednisone 10 mg daily.  We have reduced the dose at last visit.  He still has some fatigue. - He did not get Eligard with Dr. Ronne Binning.  Last Eligard injection was on 09/12/2022. - We reviewed labs today.  PSA increased to 0.39.  I think this is most likely from missing Eligard injection. - Will make referral back to Dr. Dimas Millin office to get Eligard injection. - I will maintain the current dose of Abiraterone 250 g daily and prednisone 10 mg daily. - RTC 3 months for follow-up with repeat PSA and labs.   2.  Osteopenia (DEXA scan on 05/17/2022 T score -2.3): - We have discussed starting on denosumab to decrease SRE.  He is not interested. - Continue calcium and vitamin D supplements..  Orders Placed This Encounter  Procedures   CBC with Differential/Platelet    Standing Status:   Future    Standing Expiration Date:   06/19/2024    Order Specific Question:   Release to patient    Answer:   Immediate   Comprehensive metabolic panel    Standing Status:   Future    Standing Expiration Date:   06/19/2024    Order Specific Question:   Release to patient    Answer:   Immediate   Magnesium    Standing Status:   Future    Standing Expiration Date:   06/19/2024    Order Specific Question:   Release to patient    Answer:   Immediate   PSA    Standing Status:   Future  Standing Expiration Date:   06/19/2024    I,Alexis Herring,acting as a scribe for Doreatha Massed, MD.,have documented all relevant documentation on the behalf of Doreatha Massed, MD,as directed by  Doreatha Massed, MD while in the presence of Doreatha Massed, MD.   I, Doreatha Massed MD, have reviewed the above documentation for accuracy and completeness, and I agree with the above.    Doreatha Massed, MD   7/12/20245:00 PM  CHIEF COMPLAINT:   Diagnosis: castration refractory prostate cancer to the lymph nodes    Cancer Staging  Prostate cancer metastatic to  intrapelvic lymph node [facility]) Staging form: Prostate, AJCC 7th Edition - Clinical: Stage IV (T1c, N1, M1a, PSA: Less than 10, Gleason 8-10) - Unsigned    Prior Therapy:  Prostate IMRT from 12/13/2016 through 02/09/2017, 78 Gray in 40 fractions with ADT  Enzalutamide started on 04/03/2022, discontinued due to extreme fatigue, vision changes and jaw pain Apalutamide started on 05/01/2022, taking only 2 pills but experiences jaw pain and neck pain and vision changes.  Discontinued on 05/09/2022 due to poor tolerance XRT with Dr. Kathrynn Running from 05/30/2022 through 06/30/2022  Abiraterone 1000 mg and prednisone 5 mg started on 07/06/2022   Current Therapy:  Lupron every 6 months- started 09/01/2020   HISTORY OF PRESENT ILLNESS:   Oncology History  Prostate cancer metastatic to intrapelvic lymph node (HCC)  09/29/2016 Initial Diagnosis   Prostate cancer metastatic to intrapelvic lymph node (HCC)    Genetic Testing   Negative genetic testing. No pathogenic variants identified on the Common Hereditary Cancers+RNA panel. The report date is 06/23/2022.  The Common Hereditary Cancers Panel + RNA offered by [facility] includes sequencing and/or deletion duplication testing of the following 47 genes: APC, ATM, AXIN2, BARD1, BMPR1A, BRCA1, BRCA2, BRIP1, CDH1, CDKN2A (p14ARF), CDKN2A (p16INK4a), CKD4, CHEK2, CTNNA1, DICER1, EPCAM (Deletion/duplication testing only), GREM1 (promoter region deletion/duplication testing only), KIT, MEN1, MLH1, MSH2, MSH3, MSH6, MUTYH, NBN, NF1, NHTL1, PALB2, PDGFRA, PMS2, POLD1, POLE, PTEN, RAD50, RAD51C, RAD51D, SDHB, SDHC, SDHD, SMAD4, SMARCA4. STK11, TP53, TSC1, TSC2, and VHL.  The following genes were evaluated for sequence changes only: SDHA and HOXB13 c.251G>A variant only.      INTERVAL HISTORY:   Stephen Palmer is a 72 y.o. male presenting to clinic today for follow up of castration refractory prostate cancer to the lymph nodes. He was last seen by me on 03/13/23.  Today, he states  that he is not doing well due to persistent fatigue. His appetite level is at 100%. His energy level is at 25%. Patient is accompanied by his wife.  He c/o of fatigue that has not improved since changing medication dose from last visit.  He reports heat intolerance with frequent hot flashes recently.  Pt reports that when he took Enzalutamide at night, due to the medication making him fatigued, made his blood sugar increase and then went back to taking it in the morning.  He notes he has not been given his Eligard shot because he wanted to check his PSA levels without the shot. His last Eligard shot was September 2023. His next appointment with Dr. Ronne Binning is 09/12/23.   PAST MEDICAL HISTORY:   Past Medical History: Past Medical History:  Diagnosis Date   Abdominal aortic aneurysm (AAA) (HCC)    Anxiety    COPD (chronic obstructive pulmonary disease) (HCC)    Coronary artery calcification seen on CT scan    Essential hypertension    Lung cancer (HCC)    Non small cell carcinoma - XRT  Non-small cell carcinoma of left lung, stage 1 (HCC) 09/21/2017   Prostate cancer (HCC)    XRT   Stroke (HCC)    Type 2 diabetes mellitus (HCC)     Surgical History: Past Surgical History:  Procedure Laterality Date   COLONOSCOPY N/A 11/23/2015   Procedure: COLONOSCOPY;  Surgeon: Franky Macho, MD;  [facility];  Service: Gastroenterology;  Laterality: N/A;   FRACTURE SURGERY     PROSTATE BIOPSY     SINUS SURGERY WITH INSTATRAK      Social History: Social History   Socioeconomic History   Marital status: Married    Spouse name: Not on file   Number of children: Not on file   Years of education: Not on file   Highest education level: Not on file  Occupational History   Not on file  Tobacco Use   Smoking status: Every Day    Current packs/day: 2.00    Average packs/day: 2.0 packs/day for 44.0 years (88.0 ttl pk-yrs)    Types: Cigarettes   Smokeless tobacco: Never  Vaping  Use   Vaping status: Never Used  Substance and Sexual Activity   Alcohol use: No    Alcohol/week: 0.0 standard drinks of alcohol   Drug use: No   Sexual activity: Not on file  Other Topics Concern   Not on file  Social History Narrative   Not on file   Social Determinants of Health   Financial Resource Strain: Not on file  Food Insecurity: No Food Insecurity (05/01/2023)   Hunger Vital Sign    Worried About Running Out of Food in the Last Year: Never true    Ran Out of Food in the Last Year: Never true  Transportation Needs: No Transportation Needs (05/01/2023)   PRAPARE - Administrator, Civil Service (Medical): No    Lack of Transportation (Non-Medical): No  Physical Activity: Not on file  Stress: Not on file  Social Connections: Not on file  Intimate Partner Violence: Not At Risk (05/01/2023)   Humiliation, Afraid, Rape, and Kick questionnaire    Fear of Current or Ex-Partner: No    Emotionally Abused: No    Physically Abused: No    Sexually Abused: No    Family History: Family History  Problem Relation Age of Onset   Heart disease Mother    Skin cancer Mother    Heart disease Father        before age 63   Skin cancer Brother    Breast cancer Maternal Aunt    Cancer Neg Hx     Current Medications:  Current Outpatient Medications:    abiraterone acetate (ZYTIGA) 250 MG tablet, Take 2 tablets (500 mg total) by mouth daily. Take on an empty stomach 1 hour before or 2 hours after a meal, Disp: 60 tablet, Rfl: 2   alfuzosin (UROXATRAL) 10 MG 24 hr tablet, Take 1 tablet (10 mg total) by mouth daily with breakfast., Disp: 30 tablet, Rfl: 11   alprazolam (XANAX) 2 MG tablet, Take 2 mg by mouth 2 (two) times daily., Disp: , Rfl:    Blood Glucose Monitoring Suppl (ACCU-CHEK GUIDE ME) w/Device KIT, 1 Piece by Does not apply route as directed., Disp: 1 kit, Rfl: 0   citalopram (CELEXA) 40 MG tablet, Take 20-40 mg by mouth daily., Disp: , Rfl:    glipiZIDE  (GLUCOTROL) 5 MG tablet, TAKE 1 TABLET BY MOUTH EVERY MORNING BEFORE BREAKFAST, Disp: 90 tablet, Rfl: 0  glucose blood (ACCU-CHEK GUIDE) test strip, Use to test glucose 4 times a day., Disp: 200 each, Rfl: 2   multivitamin (ONE-A-DAY MEN'S) TABS tablet, Take 1 tablet by mouth daily., Disp: , Rfl:    predniSONE (DELTASONE) 10 MG tablet, TAKE 1 TABLET BY MOUTH DAILY WITH breakfast, Disp: 30 tablet, Rfl: 6   rosuvastatin (CRESTOR) 10 MG tablet, Take 1 tablet (10 mg total) by mouth daily., Disp: 90 tablet, Rfl: 3   Allergies: Allergies  Allergen Reactions   Sulfa Antibiotics Hives, Itching and Swelling    Tongue swelling   Sulfasalazine Hives, Itching and Swelling    Tongue swelling   Lipitor [Atorvastatin] Other (See Comments)    myalgia   Simvastatin Other (See Comments)    myalgia   Iodinated Contrast Media Nausea And Vomiting    REVIEW OF SYSTEMS:   Review of Systems  Constitutional:  Positive for fatigue. Negative for chills and fever.  HENT:   Negative for lump/mass, mouth sores, nosebleeds, sore throat and trouble swallowing.   Eyes:  Negative for eye problems.  Respiratory:  Negative for cough and shortness of breath.   Cardiovascular:  Negative for chest pain, leg swelling and palpitations.  Gastrointestinal:  Positive for abdominal pain (3/10 in severity). Negative for constipation, diarrhea, nausea and vomiting.  Endocrine: Positive for hot flashes.  Genitourinary:  Positive for difficulty urinating (slow flow). Negative for bladder incontinence, dysuria, frequency, hematuria and nocturia.   Musculoskeletal:  Negative for arthralgias, back pain, flank pain, myalgias and neck pain.  Skin:  Negative for itching and rash.  Neurological:  Positive for dizziness. Negative for headaches and numbness.  Hematological:  Does not bruise/bleed easily.  Psychiatric/Behavioral:  Negative for depression, sleep disturbance and suicidal ideas. The patient is not nervous/anxious.   All  other systems reviewed and are negative.    VITALS:   Blood pressure 100/74, pulse 95, temperature 97.8 F (36.6 C), temperature source Oral, resp. rate 18, weight 151 lb 4.8 oz (68.6 kg), SpO2 93%.  Wt Readings from Last 3 Encounters:  06/20/23 151 lb 4.8 oz (68.6 kg)  03/13/23 155 lb 1.6 oz (70.4 kg)  03/12/23 155 lb 6.4 oz (70.5 kg)    Body mass index is 21.71 kg/m.  Performance status (ECOG): 1 - Symptomatic but completely ambulatory  PHYSICAL EXAM:   Physical Exam Vitals and nursing note reviewed. Exam conducted with a chaperone present.  Constitutional:      Appearance: Normal appearance.  Cardiovascular:     Rate and Rhythm: Normal rate and regular rhythm.     Pulses: Normal pulses.     Heart sounds: Normal heart sounds.  Pulmonary:     Effort: Pulmonary effort is normal.     Breath sounds: Normal breath sounds.  Abdominal:     Palpations: Abdomen is soft. There is no hepatomegaly, splenomegaly or mass.     Tenderness: There is no abdominal tenderness.  Musculoskeletal:     Right lower leg: No edema.     Left lower leg: No edema.  Lymphadenopathy:     Cervical: No cervical adenopathy.     Right cervical: No superficial, deep or posterior cervical adenopathy.    Left cervical: No superficial, deep or posterior cervical adenopathy.     Upper Body:     Right upper body: No supraclavicular or axillary adenopathy.     Left upper body: No supraclavicular or axillary adenopathy.  Neurological:     General: No focal deficit present.     Mental  Status: He is alert and oriented to person, place, and time.  Psychiatric:        Mood and Affect: Mood normal.        Behavior: Behavior normal.     LABS:      Latest Ref Rng & Units 06/12/2023    2:50 PM 03/06/2023    2:11 PM 12/04/2022    9:56 AM  CBC  WBC 4.0 - 10.5 K/uL 7.7  8.0  8.0   Hemoglobin 13.0 - 17.0 g/dL 16.1  09.6  04.5   Hematocrit 39.0 - 52.0 % 44.4  43.8  41.6   Platelets 150 - 400 K/uL 192  156  193        Latest Ref Rng & Units 06/12/2023    2:50 PM 03/06/2023    2:11 PM 12/04/2022    9:56 AM  CMP  Glucose 70 - 99 mg/dL 409  811  914   BUN 8 - 23 mg/dL 21  12  16    Creatinine 0.61 - 1.24 mg/dL 7.82  9.56  2.13   Sodium 135 - 145 mmol/L 137  139  138   Potassium 3.5 - 5.1 mmol/L 4.3  4.7  4.3   Chloride 98 - 111 mmol/L 101  103  103   CO2 22 - 32 mmol/L 27  25  28    Calcium 8.9 - 10.3 mg/dL 9.2  9.3  9.3   Total Protein 6.5 - 8.1 g/dL 6.9  7.4  7.4   Total Bilirubin 0.3 - 1.2 mg/dL 0.7  0.5  0.4   Alkaline Phos 38 - 126 U/L 61  70  74   AST 15 - 41 U/L 18  22  19    ALT 0 - 44 U/L 27  22  19       No results found for: "CEA1", "CEA" / No results found for: "CEA1", "CEA" Lab Results  Component Value Date   PSA1 0.3 09/05/2022   No results found for: "YQM578" No results found for: "CAN125"  No results found for: "TOTALPROTELP", "ALBUMINELP", "A1GS", "A2GS", "BETS", "BETA2SER", "GAMS", "MSPIKE", "SPEI" No results found for: "TIBC", "FERRITIN", "IRONPCTSAT" No results found for: "LDH"   STUDIES:   No results found.

## 2023-06-21 ENCOUNTER — Other Ambulatory Visit: Payer: Self-pay | Admitting: Urology

## 2023-06-28 ENCOUNTER — Other Ambulatory Visit (HOSPITAL_COMMUNITY): Payer: Self-pay

## 2023-07-03 ENCOUNTER — Other Ambulatory Visit: Payer: Self-pay | Admitting: Urology

## 2023-07-10 ENCOUNTER — Other Ambulatory Visit: Payer: Self-pay

## 2023-07-26 ENCOUNTER — Other Ambulatory Visit (HOSPITAL_COMMUNITY): Payer: Self-pay

## 2023-07-27 ENCOUNTER — Other Ambulatory Visit (HOSPITAL_COMMUNITY): Payer: Self-pay

## 2023-07-30 ENCOUNTER — Other Ambulatory Visit: Payer: Self-pay

## 2023-07-30 ENCOUNTER — Other Ambulatory Visit (HOSPITAL_COMMUNITY): Payer: Self-pay

## 2023-07-31 ENCOUNTER — Other Ambulatory Visit (HOSPITAL_COMMUNITY): Payer: Self-pay

## 2023-08-08 ENCOUNTER — Other Ambulatory Visit: Payer: Self-pay | Admitting: Urology

## 2023-08-08 ENCOUNTER — Other Ambulatory Visit (HOSPITAL_COMMUNITY): Payer: Self-pay

## 2023-08-16 ENCOUNTER — Other Ambulatory Visit: Payer: Self-pay | Admitting: Hematology

## 2023-08-16 ENCOUNTER — Other Ambulatory Visit: Payer: Self-pay

## 2023-08-16 ENCOUNTER — Other Ambulatory Visit: Payer: Self-pay | Admitting: *Deleted

## 2023-08-16 DIAGNOSIS — C775 Secondary and unspecified malignant neoplasm of intrapelvic lymph nodes: Secondary | ICD-10-CM

## 2023-08-16 MED ORDER — ABIRATERONE ACETATE 250 MG PO TABS
500.0000 mg | ORAL_TABLET | Freq: Every day | ORAL | 2 refills | Status: DC
Start: 2023-08-16 — End: 2023-11-01
  Filled 2023-08-16: qty 60, 30d supply, fill #0
  Filled 2023-09-18: qty 60, 30d supply, fill #1
  Filled 2023-10-11: qty 60, 30d supply, fill #2

## 2023-08-16 NOTE — Telephone Encounter (Signed)
Zytiga refill approved.  Patient is tolerating and is to continue therapy. 

## 2023-08-21 ENCOUNTER — Other Ambulatory Visit: Payer: Self-pay | Admitting: "Endocrinology

## 2023-08-23 DIAGNOSIS — E1159 Type 2 diabetes mellitus with other circulatory complications: Secondary | ICD-10-CM | POA: Diagnosis not present

## 2023-08-23 DIAGNOSIS — Z125 Encounter for screening for malignant neoplasm of prostate: Secondary | ICD-10-CM | POA: Diagnosis not present

## 2023-08-23 DIAGNOSIS — R945 Abnormal results of liver function studies: Secondary | ICD-10-CM | POA: Diagnosis not present

## 2023-08-23 DIAGNOSIS — M545 Low back pain, unspecified: Secondary | ICD-10-CM | POA: Diagnosis not present

## 2023-08-23 DIAGNOSIS — F3481 Disruptive mood dysregulation disorder: Secondary | ICD-10-CM | POA: Diagnosis not present

## 2023-08-23 DIAGNOSIS — J449 Chronic obstructive pulmonary disease, unspecified: Secondary | ICD-10-CM | POA: Diagnosis not present

## 2023-08-23 DIAGNOSIS — E782 Mixed hyperlipidemia: Secondary | ICD-10-CM | POA: Diagnosis not present

## 2023-08-23 DIAGNOSIS — Z6822 Body mass index (BMI) 22.0-22.9, adult: Secondary | ICD-10-CM | POA: Diagnosis not present

## 2023-08-23 DIAGNOSIS — Z0001 Encounter for general adult medical examination with abnormal findings: Secondary | ICD-10-CM | POA: Diagnosis not present

## 2023-08-23 DIAGNOSIS — C61 Malignant neoplasm of prostate: Secondary | ICD-10-CM | POA: Diagnosis not present

## 2023-08-23 DIAGNOSIS — I10 Essential (primary) hypertension: Secondary | ICD-10-CM | POA: Diagnosis not present

## 2023-08-23 DIAGNOSIS — Z1331 Encounter for screening for depression: Secondary | ICD-10-CM | POA: Diagnosis not present

## 2023-08-23 DIAGNOSIS — F419 Anxiety disorder, unspecified: Secondary | ICD-10-CM | POA: Diagnosis not present

## 2023-08-27 ENCOUNTER — Other Ambulatory Visit: Payer: Self-pay

## 2023-09-04 ENCOUNTER — Other Ambulatory Visit: Payer: Medicare Other

## 2023-09-04 DIAGNOSIS — C775 Secondary and unspecified malignant neoplasm of intrapelvic lymph nodes: Secondary | ICD-10-CM | POA: Diagnosis not present

## 2023-09-04 DIAGNOSIS — C61 Malignant neoplasm of prostate: Secondary | ICD-10-CM | POA: Diagnosis not present

## 2023-09-12 ENCOUNTER — Ambulatory Visit: Payer: Medicare Other | Admitting: Urology

## 2023-09-12 ENCOUNTER — Encounter: Payer: Self-pay | Admitting: Urology

## 2023-09-12 ENCOUNTER — Ambulatory Visit: Payer: Medicare Other | Admitting: "Endocrinology

## 2023-09-12 ENCOUNTER — Encounter: Payer: Self-pay | Admitting: "Endocrinology

## 2023-09-12 VITALS — BP 124/79 | HR 93

## 2023-09-12 VITALS — BP 110/76 | HR 80 | Ht 70.0 in | Wt 149.8 lb

## 2023-09-12 DIAGNOSIS — E782 Mixed hyperlipidemia: Secondary | ICD-10-CM

## 2023-09-12 DIAGNOSIS — Z7984 Long term (current) use of oral hypoglycemic drugs: Secondary | ICD-10-CM | POA: Diagnosis not present

## 2023-09-12 DIAGNOSIS — N401 Enlarged prostate with lower urinary tract symptoms: Secondary | ICD-10-CM | POA: Diagnosis not present

## 2023-09-12 DIAGNOSIS — N138 Other obstructive and reflux uropathy: Secondary | ICD-10-CM

## 2023-09-12 DIAGNOSIS — C61 Malignant neoplasm of prostate: Secondary | ICD-10-CM | POA: Diagnosis not present

## 2023-09-12 DIAGNOSIS — C775 Secondary and unspecified malignant neoplasm of intrapelvic lymph nodes: Secondary | ICD-10-CM

## 2023-09-12 DIAGNOSIS — R351 Nocturia: Secondary | ICD-10-CM | POA: Diagnosis not present

## 2023-09-12 DIAGNOSIS — E1159 Type 2 diabetes mellitus with other circulatory complications: Secondary | ICD-10-CM

## 2023-09-12 MED ORDER — GLIPIZIDE 2.5 MG PO TABS: 2.5000 mg | ORAL_TABLET | Freq: Every day | ORAL | 1 refills | Status: DC

## 2023-09-12 MED ORDER — LEUPROLIDE ACETATE (6 MONTH) 45 MG ~~LOC~~ KIT
45.0000 mg | PACK | SUBCUTANEOUS | Status: AC
Start: 2023-09-12 — End: ?
  Administered 2023-09-12: 45 mg via SUBCUTANEOUS

## 2023-09-12 NOTE — Patient Instructions (Signed)

## 2023-09-12 NOTE — Progress Notes (Signed)
09/12/2023 2:17 PM   Stephen Palmer. December 22, 1950 [id number]  Referring provider: Avis Epley, PA-C [address] [place],  [place] [address]  Followup prostate cancer   HPI: Stephen Palmer is a 72yo here for followup for metastatic prostate cancer. PSA increased to 1.1 from 0.3.  IPSS 8 QOL 3 on uroxatral 10mg . He had dizziness on flomax and fell. Urine stream strong. Nocturia 1-3x depending on fluid consumption   PMH: Past Medical History:  Diagnosis Date   Abdominal aortic aneurysm (AAA) (HCC)    Anxiety    COPD (chronic obstructive pulmonary disease) (HCC)    Coronary artery calcification seen on CT scan    Essential hypertension    Lung cancer (HCC)    Non small cell carcinoma - XRT   Non-small cell carcinoma of left lung, stage 1 (HCC) 09/21/2017   Prostate cancer (HCC)    XRT   Stroke (HCC)    Type 2 diabetes mellitus (HCC)     Surgical History: Past Surgical History:  Procedure Laterality Date   COLONOSCOPY N/A 11/23/2015   Procedure: COLONOSCOPY;  Surgeon: Franky Macho, MD;  [facility]: [facility];  Service: Gastroenterology;  Laterality: N/A;   FRACTURE SURGERY     PROSTATE BIOPSY     SINUS SURGERY WITH INSTATRAK      Home Medications:  Allergies as of 09/12/2023       Reactions   Sulfa Antibiotics Hives, Itching, Swelling   Tongue swelling   Sulfasalazine Hives, Itching, Swelling   Tongue swelling   Lipitor [atorvastatin] Other (See Comments)   myalgia   Simvastatin Other (See Comments)   myalgia   Iodinated Contrast Media Nausea And Vomiting        Medication List        Accurate as of September 12, 2023  2:17 PM. If you have any questions, ask your nurse or doctor.          abiraterone acetate 250 MG tablet Commonly known as: ZYTIGA Take 2 tablets (500 mg total) by mouth daily. Take on an empty stomach 1 hour before or 2 hours after a meal   Accu-Chek Guide Me w/Device Kit 1 Piece by Does not apply route as directed.    Accu-Chek Guide test strip Generic drug: glucose blood Use to test glucose 4 times a day.   alfuzosin 10 MG 24 hr tablet Commonly known as: UROXATRAL Take 1 tablet (10 mg total) by mouth daily with breakfast.   alprazolam 2 MG tablet Commonly known as: XANAX Take 2 mg by mouth 2 (two) times daily.   citalopram 40 MG tablet Commonly known as: CELEXA Take 20-40 mg by mouth daily.   glipiZIDE 5 MG tablet Commonly known as: GLUCOTROL TAKE 1 TABLET BY MOUTH EVERY MORNING BEFORE BREAKFAST   multivitamin Tabs tablet Take 1 tablet by mouth daily.   predniSONE 10 MG tablet Commonly known as: DELTASONE TAKE 1 TABLET BY MOUTH DAILY WITH breakfast   rosuvastatin 10 MG tablet Commonly known as: CRESTOR TAKE 1 TABLET BY MOUTH DAILY        Allergies:  Allergies  Allergen Reactions   Sulfa Antibiotics Hives, Itching and Swelling    Tongue swelling   Sulfasalazine Hives, Itching and Swelling    Tongue swelling   Lipitor [Atorvastatin] Other (See Comments)    myalgia   Simvastatin Other (See Comments)    myalgia   Iodinated Contrast Media Nausea And Vomiting    Family History: Family History  Problem Relation  Age of Onset   Heart disease Mother    Skin cancer Mother    Heart disease Father        before age 39   Skin cancer Brother    Breast cancer Maternal Aunt    Cancer Neg Hx     Social History:  reports that he has been smoking cigarettes. He has a 88 pack-year smoking history. He has never used smokeless tobacco. He reports that he does not drink alcohol and does not use drugs.  ROS: All other review of systems were reviewed and are negative except what is noted above in HPI  Physical Exam: BP 124/79   Pulse 93   Constitutional:  Alert and oriented, No acute distress. HEENT: [place] AT, moist mucus membranes.  Trachea midline, no masses. Cardiovascular: No clubbing, cyanosis, or edema. Respiratory: Normal respiratory effort, no increased work of breathing. GI:  Abdomen is soft, nontender, nondistended, no abdominal masses GU: No CVA tenderness.  Lymph: No cervical or inguinal lymphadenopathy. Skin: No rashes, bruises or suspicious lesions. Neurologic: Grossly intact, no focal deficits, moving all 4 extremities. Psychiatric: Normal mood and affect.  Laboratory Data: Lab Results  Component Value Date   WBC 7.7 06/12/2023   HGB 14.6 06/12/2023   HCT 44.4 06/12/2023   MCV 98.0 06/12/2023   PLT 192 06/12/2023    Lab Results  Component Value Date   CREATININE 1.17 06/12/2023    Lab Results  Component Value Date   PSA 9.7 (H) 05/19/2020   PSA 8.7 (H) 05/11/2020    Lab Results  Component Value Date   TESTOSTERONE 166 (L) 05/11/2020    Lab Results  Component Value Date   HGBA1C 7.3 (A) 03/12/2023    Urinalysis    Component Value Date/Time   APPEARANCEUR Clear 06/15/2023 1147   GLUCOSEU Negative 06/15/2023 1147   BILIRUBINUR Negative 06/15/2023 1147   PROTEINUR Negative 06/15/2023 1147   NITRITE Negative 06/15/2023 1147   LEUKOCYTESUR Negative 06/15/2023 1147    Lab Results  Component Value Date   LABMICR Comment 06/15/2023   WBCUA 0-5 03/14/2023   LABEPIT 0-10 03/14/2023   BACTERIA None seen 03/14/2023    Pertinent Imaging:  Results for orders placed during the hospital encounter of 05/14/08  DG Abd 1 View  Narrative Clinical Data: Left ureteral calculus.  ABDOMEN - 1 VIEW  Comparison: CT abdomen and pelvis 04/08/08.  Findings: No unexpected calcifications are seen over the renal shadows, expected course of the ureters or urinary bladder. Specifically, the small left UVJ stone seen the patient's CT scan is not visible on this study.  Bowel gas pattern is normal.  No focal bony abnormality.  IMPRESSION: Negative for plain film evidence of urinary tract calculi.  Provider: Wilkie Aye, Hedda Slade  No results found for this or any previous visit.  No results found for this or any previous  visit.  No results found for this or any previous visit.  No results found for this or any previous visit.  No valid procedures specified. No results found for this or any previous visit.  No results found for this or any previous visit.   Assessment & Plan:    1. Prostate cancer metastatic to intrapelvic lymph node (HCC) -eligard 45mg  today -followup 3 months with PSA  2. BPH with nocturia -continue uroxatral 10mg  daily   No follow-ups on file.  Wilkie Aye, MD  [facility]

## 2023-09-12 NOTE — Progress Notes (Signed)
09/12/2023, 7:00 PM          Endocrinology follow-up note   Subjective:    Patient ID: Stephen Palmer., male    DOB: 11-Apr-1951.  Stephen Palmer. is being seen in follow up in the management of his currently  controlled symptomatic type 2 diabetes requested by  Avis Epley, PA-C   Past Medical History:  Diagnosis Date   Abdominal aortic aneurysm (AAA) (HCC)    Anxiety    COPD (chronic obstructive pulmonary disease) (HCC)    Coronary artery calcification seen on CT scan    Essential hypertension    Lung cancer (HCC)    Non small cell carcinoma - XRT   Non-small cell carcinoma of left lung, stage 1 (HCC) 09/21/2017   Prostate cancer (HCC)    XRT   Stroke (HCC)    Type 2 diabetes mellitus (HCC)    Past Surgical History:  Procedure Laterality Date   COLONOSCOPY N/A 11/23/2015   Procedure: COLONOSCOPY;  Surgeon: Franky Macho, MD;  Location: [facility];  Service: Gastroenterology;  Laterality: N/A;   FRACTURE SURGERY     PROSTATE BIOPSY     SINUS SURGERY WITH INSTATRAK     Social History   Socioeconomic History   Marital status: Married    Spouse name: Not on file   Number of children: Not on file   Years of education: Not on file   Highest education level: Not on file  Occupational History   Not on file  Tobacco Use   Smoking status: Every Day    Current packs/day: 2.00    Average packs/day: 2.0 packs/day for 44.0 years (88.0 ttl pk-yrs)    Types: Cigarettes   Smokeless tobacco: Never  Vaping Use   Vaping status: Never Used  Substance and Sexual Activity   Alcohol use: No    Alcohol/week: 0.0 standard drinks of alcohol   Drug use: No   Sexual activity: Not on file  Other Topics Concern   Not on file  Social History Narrative   Not on file   Social Determinants of Health   Financial Resource Strain: Not on file  Food Insecurity: No Food Insecurity (05/01/2023)   Hunger Vital Sign    Worried About Running Out of  Food in the Last Year: Never true    Ran Out of Food in the Last Year: Never true  Transportation Needs: No Transportation Needs (05/01/2023)   PRAPARE - Administrator, Civil Service (Medical): No    Lack of Transportation (Non-Medical): No  Physical Activity: Not on file  Stress: Not on file  Social Connections: Not on file   Outpatient Encounter Medications as of 09/12/2023  Medication Sig   abiraterone acetate (ZYTIGA) 250 MG tablet Take 2 tablets (500 mg total) by mouth daily. Take on an empty stomach 1 hour before or 2 hours after a meal   alfuzosin (UROXATRAL) 10 MG 24 hr tablet Take 1 tablet (10 mg total) by mouth daily with breakfast.   alprazolam (XANAX) 2 MG tablet Take 2 mg by mouth 2 (two) times daily.   Blood Glucose Monitoring Suppl (ACCU-CHEK GUIDE ME) w/Device KIT 1 Piece by Does not apply route as directed.   citalopram (CELEXA) 40 MG tablet Take 20-40 mg by mouth daily.   glipiZIDE 2.5 MG TABS Take 2.5 mg by mouth daily before breakfast.   glucose blood (ACCU-CHEK GUIDE) test strip Use to  test glucose 4 times a day.   multivitamin (ONE-A-DAY MEN'S) TABS tablet Take 1 tablet by mouth daily.   predniSONE (DELTASONE) 10 MG tablet TAKE 1 TABLET BY MOUTH DAILY WITH breakfast   rosuvastatin (CRESTOR) 10 MG tablet TAKE 1 TABLET BY MOUTH DAILY   [DISCONTINUED] glipiZIDE (GLUCOTROL) 5 MG tablet TAKE 1 TABLET BY MOUTH EVERY MORNING BEFORE BREAKFAST   Facility-Administered Encounter Medications as of 09/12/2023  Medication   leuprolide (6 Month) (ELIGARD) injection 45 mg    ALLERGIES: Allergies  Allergen Reactions   Sulfa Antibiotics Hives, Itching and Swelling    Tongue swelling   Sulfasalazine Hives, Itching and Swelling    Tongue swelling   Lipitor [Atorvastatin] Other (See Comments)    myalgia   Simvastatin Other (See Comments)    myalgia   Iodinated Contrast Media Nausea And Vomiting    VACCINATION STATUS:  There is no immunization history on file for  this patient.  Diabetes He presents for his follow-up diabetic visit. He has type 2 diabetes mellitus. Onset time: He was diagnosed at approximate age of 3 years. His disease course has been improving. There are no hypoglycemic associated symptoms. Pertinent negatives for hypoglycemia include no confusion, headaches, pallor or seizures. Associated symptoms include blurred vision. Pertinent negatives for diabetes include no chest pain, no fatigue, no polydipsia, no polyphagia, no polyuria and no weakness. There are no hypoglycemic complications. Symptoms are stable. Diabetic complications include a CVA. Risk factors for coronary artery disease include diabetes mellitus, hypertension, male sex and tobacco exposure. Current diabetic treatment includes oral agent (monotherapy) (Is currently taking glipizide 10 mg p.o. twice daily.). His weight is decreasing steadily. He is following a generally unhealthy diet. When asked about meal planning, he reported none. He has not had a previous visit with a dietitian. He never participates in exercise. His home blood glucose trend is decreasing steadily. His breakfast blood glucose range is generally 110-130 mg/dl. His bedtime blood glucose range is generally 110-130 mg/dl. His overall blood glucose range is 110-130 mg/dl. (He presents with tightly controlled glycemic profile averaging 108-124 over the last 30 days.  His previsit A1c was 6.3% improving from 7.3%.   He presents with the a few pounds of weight loss.   ) An ACE inhibitor/angiotensin II receptor blocker is not being taken. Eye exam is current.   Review of systems: Limited as above.    Objective:    BP 110/76   Pulse 80   Ht 5\' 10"  (1.778 m)   Wt 149 lb 12.8 oz (67.9 kg)   BMI 21.49 kg/m   Wt Readings from Last 3 Encounters:  09/12/23 149 lb 12.8 oz (67.9 kg)  06/20/23 151 lb 4.8 oz (68.6 kg)  03/13/23 155 lb 1.6 oz (70.4 kg)       Recent Results (from the past 2160 hour(s))  Urinalysis,  Routine w reflex microscopic     Status: Abnormal   Collection Time: 06/15/23 11:47 AM  Result Value Ref Range   Specific Gravity, UA <1.005 (L) 1.005 - 1.030   pH, UA 6.0 5.0 - 7.5   Color, UA Yellow Yellow   Appearance Ur Clear Clear   Leukocytes,UA Negative Negative   Protein,UA Negative Negative/Trace   Glucose, UA Negative Negative   Ketones, UA Negative Negative   RBC, UA Negative Negative   Bilirubin, UA Negative Negative   Urobilinogen, Ur 0.2 0.2 - 1.0 mg/dL   Nitrite, UA Negative Negative   Microscopic Examination Comment  Comment: Microscopic not indicated and not performed.  PSA     Status: None   Collection Time: 09/04/23  3:09 PM  Result Value Ref Range   Prostate Specific Ag, Serum 1.1 0.0 - 4.0 ng/mL    Comment: Roche ECLIA methodology. According to the American Urological Association, Serum PSA should decrease and remain at undetectable levels after radical prostatectomy. The AUA defines biochemical recurrence as an initial PSA value 0.2 ng/mL or greater followed by a subsequent confirmatory PSA value 0.2 ng/mL or greater. Values obtained with different assay methods or kits cannot be used interchangeably. Results cannot be interpreted as absolute evidence of the presence or absence of malignant disease.       Assessment & Plan:   1. DM type 2 causing vascular disease (HCC)  - Stephen Palmer. has currently uncontrolled symptomatic type 2 DM since 72 years of age. He presents with tightly controlled glycemic profile averaging 108-124 over the last 30 days.  His previsit A1c was 6.3% improving from 7.3%.   He presents with the a few pounds of weight loss.  Recent labs are reviewed with him.   -his diabetes is complicated by CVA , chronic heavy smoking, and he remains at extremely high risk for more acute and chronic complications which include CAD, CVA, CKD, retinopathy, and neuropathy. These are all discussed in detail with him.    - I encouraged  him to switch to  unprocessed or minimally processed complex starch and increased protein intake (animal or plant source), fruits, and vegetables.  - he is advised to stick to a routine mealtimes to eat 3 meals  a day and avoid unnecessary snacks ( to snack only to correct hypoglycemia).    - I have approached him with the following individualized plan to manage diabetes and patient agrees:   -Based on his presentation with tightly controlled glycemic profile, he will not need further escalation in his medications.  In fact, he is allowed to lower his glipizide to 2.5 mg XL p.o. daily at breakfast.     He agrees and advised to continue monitoring blood glucose twice a day-daily before breakfast and at bedtime.   -Priority in this patient will be to avoid hypoglycemia. - he is encouraged to call clinic for blood glucose levels less than 70 or above 200 mg /dl.  -He is not a suitable candidate for metformin, SGLT2 inhibitors, nor incretin therapy. -He will be reapproached for insulin treatment if he loses control by next visit.  - Patient specific target  A1c;  LDL, HDL, Triglycerides  were discussed in detail.  2) BP/HTN:  -His blood pressure is controlled to target.   He is not on any antihypertensive medications, reported sulfa allergy.   3) Lipids/HPL: His recent labs show LDL of 48.  He is advised to continue Crestor 20 mg p.o. daily at bedtime.    4)  Weight/Diet: He was recently diagnosed with lung cancer waiting for radiation therapy.  Is not a candidate for weight loss.   The patient was counseled on the dangers of tobacco use, and was advised to quit.  Reviewed strategies to maximize success, including removing cigarettes and smoking materials from environment.   5) Chronic Care/Health Maintenance:  -he  Is not on ACEI/ARB and Statin medications and  is encouraged to initiate and continue to follow up with Ophthalmology, Dentist,  Podiatrist at least yearly or according to  recommendations, and advised to  Quit smoking (this is his #1health risk).  I have recommended yearly flu vaccine and pneumonia vaccine at least every 5 years; moderate intensity exercise for up to 150 minutes weekly; and  sleep for at least 7 hours a day.  The patient was counseled on the dangers of tobacco use, and was advised to quit.  Reviewed strategies to maximize success, including removing cigarettes and smoking materials from environment.  - I advised patient to maintain close follow up with Avis Epley, PA-C for primary care needs.  I spent  22  minutes in the care of the patient today including review of labs from Thyroid Function, CMP, and other relevant labs ; imaging/biopsy records (current and previous including abstractions from other facilities); face-to-face time discussing  his lab results and symptoms, medications doses, his options of short and long term treatment based on the latest standards of care / guidelines;   and documenting the encounter.  Stephen Palmer.  participated in the discussions, expressed understanding, and voiced agreement with the above plans.  All questions were answered to his satisfaction. he is encouraged to contact clinic should he have any questions or concerns prior to his return visit.   Follow up plan: - Return in about 6 months (around 03/11/2024) for Fasting Labs  in AM B4 8, A1c -NV.  Marquis Lunch, MD [facility] [address] [place], [place] [address] Phone: [phone]  Fax: [phone]    09/12/2023, 7:00 PM  This note was partially dictated with voice recognition software. Similar sounding words can be transcribed inadequately or may not  be corrected upon review.

## 2023-09-16 ENCOUNTER — Encounter: Payer: Self-pay | Admitting: Urology

## 2023-09-18 ENCOUNTER — Other Ambulatory Visit (HOSPITAL_COMMUNITY): Payer: Self-pay

## 2023-09-18 ENCOUNTER — Other Ambulatory Visit: Payer: Self-pay | Admitting: Pharmacy Technician

## 2023-09-18 NOTE — Progress Notes (Signed)
Specialty Pharmacy Refill Coordination Note  Stephen Palmer. is a 72 y.o. male contacted today regarding refills of specialty medication(s) Abiraterone Acetate .  Patient requested Delivery  on 09/25/23  to verified address [address] [place], [place]   Medication will be filled on 09/24/23.

## 2023-09-19 ENCOUNTER — Inpatient Hospital Stay: Payer: Medicare Other

## 2023-09-26 ENCOUNTER — Inpatient Hospital Stay: Payer: Medicare Other | Admitting: Hematology

## 2023-09-26 DIAGNOSIS — F17210 Nicotine dependence, cigarettes, uncomplicated: Secondary | ICD-10-CM | POA: Diagnosis not present

## 2023-09-26 DIAGNOSIS — I10 Essential (primary) hypertension: Secondary | ICD-10-CM | POA: Diagnosis not present

## 2023-09-26 DIAGNOSIS — M79606 Pain in leg, unspecified: Secondary | ICD-10-CM | POA: Diagnosis not present

## 2023-09-26 DIAGNOSIS — J701 Chronic and other pulmonary manifestations due to radiation: Secondary | ICD-10-CM | POA: Diagnosis not present

## 2023-09-26 DIAGNOSIS — E119 Type 2 diabetes mellitus without complications: Secondary | ICD-10-CM | POA: Diagnosis not present

## 2023-09-26 DIAGNOSIS — J449 Chronic obstructive pulmonary disease, unspecified: Secondary | ICD-10-CM | POA: Diagnosis not present

## 2023-09-26 DIAGNOSIS — I7141 Pararenal abdominal aortic aneurysm, without rupture: Secondary | ICD-10-CM | POA: Diagnosis not present

## 2023-09-26 DIAGNOSIS — I714 Abdominal aortic aneurysm, without rupture, unspecified: Secondary | ICD-10-CM | POA: Diagnosis not present

## 2023-10-08 ENCOUNTER — Inpatient Hospital Stay: Payer: Medicare Other | Attending: Genetics

## 2023-10-08 DIAGNOSIS — C775 Secondary and unspecified malignant neoplasm of intrapelvic lymph nodes: Secondary | ICD-10-CM | POA: Diagnosis not present

## 2023-10-08 DIAGNOSIS — F17210 Nicotine dependence, cigarettes, uncomplicated: Secondary | ICD-10-CM | POA: Insufficient documentation

## 2023-10-08 DIAGNOSIS — Z79899 Other long term (current) drug therapy: Secondary | ICD-10-CM | POA: Diagnosis not present

## 2023-10-08 DIAGNOSIS — C61 Malignant neoplasm of prostate: Secondary | ICD-10-CM | POA: Insufficient documentation

## 2023-10-08 LAB — COMPREHENSIVE METABOLIC PANEL
ALT: 20 U/L (ref 0–44)
AST: 20 U/L (ref 15–41)
Albumin: 4.3 g/dL (ref 3.5–5.0)
Alkaline Phosphatase: 70 U/L (ref 38–126)
Anion gap: 9 (ref 5–15)
BUN: 18 mg/dL (ref 8–23)
CO2: 27 mmol/L (ref 22–32)
Calcium: 9.1 mg/dL (ref 8.9–10.3)
Chloride: 100 mmol/L (ref 98–111)
Creatinine, Ser: 1.17 mg/dL (ref 0.61–1.24)
GFR, Estimated: >60 mL/min (ref >60)
Glucose, Bld: 147 mg/dL — ABNORMAL HIGH (ref 70–99)
Potassium: 3.8 mmol/L (ref 3.5–5.1)
Sodium: 136 mmol/L (ref 135–145)
Total Bilirubin: 0.4 mg/dL (ref 0.3–1.2)
Total Protein: 7.4 g/dL (ref 6.5–8.1)

## 2023-10-08 LAB — CBC WITH DIFFERENTIAL/PLATELET
Abs Immature Granulocytes: 0.06 10*3/uL (ref 0.00–0.07)
Basophils Absolute: 0 10*3/uL (ref 0.0–0.1)
Basophils Relative: 0 %
Eosinophils Absolute: 0.1 10*3/uL (ref 0.0–0.5)
Eosinophils Relative: 1 %
HCT: 46.4 % (ref 39.0–52.0)
Hemoglobin: 15 g/dL (ref 13.0–17.0)
Immature Granulocytes: 1 %
Lymphocytes Relative: 9 %
Lymphs Abs: 0.9 10*3/uL (ref 0.7–4.0)
MCH: 31.8 pg (ref 26.0–34.0)
MCHC: 32.3 g/dL (ref 30.0–36.0)
MCV: 98.3 fL (ref 80.0–100.0)
Monocytes Absolute: 0.7 10*3/uL (ref 0.1–1.0)
Monocytes Relative: 7 %
Neutro Abs: 8.3 10*3/uL — ABNORMAL HIGH (ref 1.7–7.7)
Neutrophils Relative %: 82 %
Platelets: 197 10*3/uL (ref 150–400)
RBC: 4.72 MIL/uL (ref 4.22–5.81)
RDW: 13.2 % (ref 11.5–15.5)
WBC: 10.1 10*3/uL (ref 4.0–10.5)
nRBC: 0 % (ref 0.0–0.2)

## 2023-10-08 LAB — PSA: Prostatic Specific Antigen: 2.15 ng/mL (ref 0.00–4.00)

## 2023-10-08 LAB — MAGNESIUM: Magnesium: 2.2 mg/dL (ref 1.7–2.4)

## 2023-10-11 ENCOUNTER — Other Ambulatory Visit: Payer: Self-pay

## 2023-10-11 NOTE — Progress Notes (Signed)
Specialty Pharmacy Ongoing Clinical Assessment Note  Stephen Palmer. is a 72 y.o. male who is being followed by the specialty pharmacy service for RxSp Oncology   Patient's specialty medication(s) reviewed today: Abiraterone Acetate   Missed doses in the last 4 weeks: 0   Patient/Caregiver did not have any additional questions or concerns.   Therapeutic benefit summary: Unable to assess  Pt will review lab results with prescriber at next appt scheduled on 10/28.   Pt reported completing a course of ABX.    Adverse events/side effects summary: No adverse events/side effects   Patient's therapy is appropriate to: Continue    Goals Addressed             This Visit's Progress    Stabilization of disease       Patient is on track. Patient will maintain adherence         Follow up:  6 months  Bobette Mo Specialty Pharmacist

## 2023-10-11 NOTE — Progress Notes (Signed)
Specialty Pharmacy Refill Coordination Note  Stephen Palmer. is a 72 y.o. male contacted today regarding refills of specialty medication(s) Abiraterone Acetate   Patient requested Delivery   Delivery date: 10/19/23   Verified address: [address]   Medication will be filled on 10/11/23.

## 2023-10-15 ENCOUNTER — Inpatient Hospital Stay (HOSPITAL_BASED_OUTPATIENT_CLINIC_OR_DEPARTMENT_OTHER): Payer: Medicare Other | Admitting: Hematology

## 2023-10-15 ENCOUNTER — Other Ambulatory Visit: Payer: Self-pay

## 2023-10-15 VITALS — BP 105/75 | HR 89 | Temp 97.8°F | Resp 19 | Ht 70.0 in | Wt 146.2 lb

## 2023-10-15 DIAGNOSIS — C771 Secondary and unspecified malignant neoplasm of intrathoracic lymph nodes: Secondary | ICD-10-CM | POA: Diagnosis not present

## 2023-10-15 DIAGNOSIS — C775 Secondary and unspecified malignant neoplasm of intrapelvic lymph nodes: Secondary | ICD-10-CM | POA: Diagnosis not present

## 2023-10-15 DIAGNOSIS — C61 Malignant neoplasm of prostate: Secondary | ICD-10-CM

## 2023-10-15 DIAGNOSIS — Z79899 Other long term (current) drug therapy: Secondary | ICD-10-CM | POA: Diagnosis not present

## 2023-10-15 DIAGNOSIS — F17210 Nicotine dependence, cigarettes, uncomplicated: Secondary | ICD-10-CM | POA: Diagnosis not present

## 2023-10-15 NOTE — Patient Instructions (Addendum)
[facility] at [facility] Discharge Instructions   You were seen and examined today by Dr. Ellin Saba.  He reviewed the results of your lab work. Your PSA has gone from 0.39 to 2.19. All other results were normal/stable.   It seems that the Stephen Palmer has stopped working. Dr. Kirtland Bouchard recommends we get a PET scan to see where the cancer is and if it has spread.   We will see you back after the PET scan to review the results and discuss further treatment options.   Return as scheduled.     Thank you for choosing [facility] at [facility] to provide your oncology and hematology care.  To afford each patient quality time with our provider, please arrive at least 15 minutes before your scheduled appointment time.   If you have a lab appointment with the Cancer Center please come in thru the Main Entrance and check in at the main information desk.  You need to re-schedule your appointment should you arrive 10 or more minutes late.  We strive to give you quality time with our providers, and arriving late affects you and other patients whose appointments are after yours.  Also, if you no show three or more times for appointments you may be dismissed from the clinic at the providers discretion.     Again, thank you for choosing [facility].  Our hope is that these requests will decrease the amount of time that you wait before being seen by our physicians.       _____________________________________________________________  Should you have questions after your visit to [facility], please contact our office at [phone] and follow the prompts.  Our office hours are 8:00 a.m. and 4:30 p.m. Monday - Friday.  Please note that voicemails left after 4:00 p.m. may not be returned until the following business day.  We are closed weekends and major holidays.  You do have access to a nurse 24-7, just call the main number to the clinic  [phone] and do not press any options, hold on the line and a nurse will answer the phone.    For prescription refill requests, have your pharmacy contact our office and allow 72 hours.    Due to Covid, you will need to wear a mask upon entering the hospital. If you do not have a mask, a mask will be given to you at the Main Entrance upon arrival. For doctor visits, patients may have 1 support person age 28 or older with them. For treatment visits, patients can not have anyone with them due to social distancing guidelines and our immunocompromised population.

## 2023-10-15 NOTE — Progress Notes (Signed)
[facility] [address] [place] [address]    Clinic Day:  10/15/2023  Referring physician: Avis Epley, PA*  Patient Care Team: Ladon Applebaum as PCP - General (Family Medicine) Jonelle Sidle, MD as PCP - Cardiology (Cardiology) Franky Macho, MD as Consulting Physician (General Surgery) Doreatha Massed, MD as Medical Oncologist (Medical Oncology) Therese Sarah, RN as Oncology Nurse Navigator (Medical Oncology)   ASSESSMENT & PLAN:   Assessment: 1. 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 - Prostate IMRT from 12/13/2016 through 02/09/2017, 78 Gray in 40 fractions with ADT. - Lupron started back on 09/01/2020 for rising PSA levels. - PSMA PET scan (03/30/2022): Nodes in the retroperitoneum, right pelvis, left lower jugular/supraclavicular stations.  No bone metastasis.  No local recurrence. - Enzalutamide started on 04/03/2022, discontinued due to extreme fatigue, vision changes and jaw pain. - Apalutamide started on 05/01/2022, taking only 2 pills but experiences jaw pain and neck pain and vision changes.  Discontinued on 05/09/2022 due to poor tolerance. - XRT with Dr. Kathrynn Running from 05/30/2022 through 06/30/2022. -Abiraterone 1000 mg and prednisone 5 mg started on 07/06/2022, dose reduced to 750 mg daily, dose further reduced to 500 mg daily on 12/12/2022 due to severe fatigue.  2. Social/family history: - Lives at home with his wife.  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 - Current active smoker, 2 packs/day for 55 years.  Rolls his own cigarettes. - Mother had skin cancers.  3.  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 - Definitive SBRT from 10/05/2017 through 10/12/2017, 54 Gray in 3 fractions of 18 Gray.    Plan: Metastatic CRPC to the lymph nodes: - He is taking  abiraterone 500 mg daily with prednisone 10 mg daily. - Last Eligard injection 45 mg on 09/12/2023. - Reviewed labs from 10/08/2023: PSA increased to 2.15 from 1.1 on 09/04/2023.  It was 0.39 on 06/12/2023.  LFTs and CBC are normal. - He is having progression of disease.  I have recommended that he discontinue abiraterone. - He will cut back on prednisone to 5 mg daily. - Recommend PSMA PET scan.  We will discuss treatment options at follow-up visit.   2.  Osteopenia (DEXA scan on 05/17/2022 T score -2.3): - We previously discussed starting on denosumab to decrease SRE which he declined.  Continue calcium and vitamin D supplements.    Orders Placed This Encounter  Procedures   NM PET (PSMA) SKULL TO MID THIGH    Standing Status:   Future    Standing Expiration Date:   10/14/2024    Order Specific Question:   If indicated for the ordered procedure, I authorize the administration of a radiopharmaceutical per Radiology protocol    Answer:   Yes    Order Specific Question:   Preferred imaging location?    Answer:   [facility]    Order Specific Question:   Release to patient    Answer:   Immediate      I,Katie Daubenspeck,acting as a scribe for Doreatha Massed, MD.,have documented all relevant documentation on the behalf of Doreatha Massed, MD,as directed by  Doreatha Massed, MD while in the presence of Doreatha Massed, MD.   I, Doreatha Massed MD, have reviewed the above documentation for accuracy and completeness, and I agree with the above.   Doreatha Massed, MD  10/28/202412:36 PM  CHIEF COMPLAINT:   Diagnosis: castration refractory prostate cancer to the lymph nodes    Cancer Staging  Prostate cancer metastatic to intrapelvic lymph node (HCC) Staging form: Prostate, AJCC 7th Edition - Clinical: Stage IV (T1c, N1, M1a, PSA: Less than 10, Gleason 8-10) - Unsigned    Prior Therapy:  Prostate IMRT from 12/13/2016 through 02/09/2017, 78 Gray in 40  fractions with ADT  Enzalutamide started on 04/03/2022, discontinued due to extreme fatigue, vision changes and jaw pain Apalutamide started on 05/01/2022, taking only 2 pills but experiences jaw pain and neck pain and vision changes.  Discontinued on 05/09/2022 due to poor tolerance XRT with Dr. Kathrynn Running from 05/30/2022 through 06/30/2022  Abiraterone 1000 mg and prednisone 5 mg started on 07/06/2022   Current Therapy:  Lupron every 6 months- started 09/01/2020    HISTORY OF PRESENT ILLNESS:   Oncology History  Prostate cancer metastatic to intrapelvic lymph node (HCC)  09/29/2016 Initial Diagnosis   Prostate cancer metastatic to intrapelvic lymph node (HCC)    Genetic Testing   Negative genetic testing. No pathogenic variants identified on the Common Hereditary Cancers+RNA panel. The report date is 06/23/2022.  The Common Hereditary Cancers Panel + RNA offered by [facility] includes sequencing and/or deletion duplication testing of the following 47 genes: APC, ATM, AXIN2, BARD1, BMPR1A, BRCA1, BRCA2, BRIP1, CDH1, CDKN2A (p14ARF), CDKN2A (p16INK4a), CKD4, CHEK2, CTNNA1, DICER1, EPCAM (Deletion/duplication testing only), GREM1 (promoter region deletion/duplication testing only), KIT, MEN1, MLH1, MSH2, MSH3, MSH6, MUTYH, NBN, NF1, NHTL1, PALB2, PDGFRA, PMS2, POLD1, POLE, PTEN, RAD50, RAD51C, RAD51D, SDHB, SDHC, SDHD, SMAD4, SMARCA4. STK11, TP53, TSC1, TSC2, and VHL.  The following genes were evaluated for sequence changes only: SDHA and HOXB13 c.251G>A variant only.      INTERVAL HISTORY:   Stephen Palmer is a 72 y.o. male presenting to clinic today for follow up of castration refractory prostate cancer to the lymph nodes. He was last seen by me on 06/20/23.  Today, he states that he is doing well overall. His appetite level is at 65%. His energy level is at 10%.  PAST MEDICAL HISTORY:   Past Medical History: Past Medical History:  Diagnosis Date   Abdominal aortic aneurysm (AAA) (HCC)    Anxiety     COPD (chronic obstructive pulmonary disease) (HCC)    Coronary artery calcification seen on CT scan    Essential hypertension    Lung cancer (HCC)    Non small cell carcinoma - XRT   Non-small cell carcinoma of left lung, stage 1 (HCC) 09/21/2017   Prostate cancer (HCC)    XRT   Stroke (HCC)    Type 2 diabetes mellitus (HCC)     Surgical History: Past Surgical History:  Procedure Laterality Date   COLONOSCOPY N/A 11/23/2015   Procedure: COLONOSCOPY;  Surgeon: Franky Macho, MD;  Location: [facility];  Service: Gastroenterology;  Laterality: N/A;   FRACTURE SURGERY     PROSTATE BIOPSY     SINUS SURGERY WITH INSTATRAK      Social History: Social History   Socioeconomic History   Marital status: Married    Spouse name: Not on file   Number of children: Not on file   Years of education: Not on file   Highest education level: Not on file  Occupational History   Not on file  Tobacco Use   Smoking status: Every Day    Current packs/day: 2.00    Average packs/day: 2.0 packs/day for 44.0 years (88.0 ttl pk-yrs)  Types: Cigarettes   Smokeless tobacco: Never  Vaping Use   Vaping status: Never Used  Substance and Sexual Activity   Alcohol use: No    Alcohol/week: 0.0 standard drinks of alcohol   Drug use: No   Sexual activity: Not on file  Other Topics Concern   Not on file  Social History Narrative   Not on file   Social Determinants of Health   Financial Resource Strain: Not on file  Food Insecurity: No Food Insecurity (05/01/2023)   Hunger Vital Sign    Worried About Running Out of Food in the Last Year: Never true    Ran Out of Food in the Last Year: Never true  Transportation Needs: No Transportation Needs (05/01/2023)   PRAPARE - Administrator, Civil Service (Medical): No    Lack of Transportation (Non-Medical): No  Physical Activity: Not on file  Stress: Not on file  Social Connections: Not on file  Intimate Partner Violence: Not At Risk  (05/01/2023)   Humiliation, Afraid, Rape, and Kick questionnaire    Fear of Current or Ex-Partner: No    Emotionally Abused: No    Physically Abused: No    Sexually Abused: No    Family History: Family History  Problem Relation Age of Onset   Heart disease Mother    Skin cancer Mother    Heart disease Father        before age 20   Skin cancer Brother    Breast cancer Maternal Aunt    Cancer Neg Hx     Current Medications:  Current Outpatient Medications:    abiraterone acetate (ZYTIGA) 250 MG tablet, Take 2 tablets (500 mg total) by mouth daily. Take on an empty stomach 1 hour before or 2 hours after a meal, Disp: 60 tablet, Rfl: 2   alfuzosin (UROXATRAL) 10 MG 24 hr tablet, Take 1 tablet (10 mg total) by mouth daily with breakfast., Disp: 30 tablet, Rfl: 11   alprazolam (XANAX) 2 MG tablet, Take 2 mg by mouth 2 (two) times daily., Disp: , Rfl:    Blood Glucose Monitoring Suppl (ACCU-CHEK GUIDE ME) w/Device KIT, 1 Piece by Does not apply route as directed., Disp: 1 kit, Rfl: 0   citalopram (CELEXA) 40 MG tablet, Take 20-40 mg by mouth daily., Disp: , Rfl:    glipiZIDE 2.5 MG TABS, Take 2.5 mg by mouth daily before breakfast., Disp: 90 tablet, Rfl: 1   glucose blood (ACCU-CHEK GUIDE) test strip, Use to test glucose 4 times a day., Disp: 200 each, Rfl: 2   multivitamin (ONE-A-DAY MEN'S) TABS tablet, Take 1 tablet by mouth daily., Disp: , Rfl:    predniSONE (DELTASONE) 10 MG tablet, TAKE 1 TABLET BY MOUTH DAILY WITH breakfast, Disp: 30 tablet, Rfl: 6   rosuvastatin (CRESTOR) 10 MG tablet, TAKE 1 TABLET BY MOUTH DAILY, Disp: 90 tablet, Rfl: 3  Current Facility-Administered Medications:    leuprolide (6 Month) (ELIGARD) injection 45 mg, 45 mg, Subcutaneous, Q6 months, , 45 mg at 09/12/23 1447   Allergies: Allergies  Allergen Reactions   Sulfa Antibiotics Hives, Itching and Swelling    Tongue swelling   Sulfasalazine Hives, Itching and Swelling    Tongue swelling   Lipitor  [Atorvastatin] Other (See Comments)    myalgia   Simvastatin Other (See Comments)    myalgia   Iodinated Contrast Media Nausea And Vomiting    REVIEW OF SYSTEMS:   Review of Systems  Constitutional:  Negative for chills, fatigue  and fever.  HENT:   Negative for lump/mass, mouth sores, nosebleeds, sore throat and trouble swallowing.   Eyes:  Negative for eye problems.  Respiratory:  Positive for cough. Negative for shortness of breath.   Cardiovascular:  Negative for chest pain, leg swelling and palpitations.  Gastrointestinal:  Negative for abdominal pain, constipation, diarrhea, nausea and vomiting.  Genitourinary:  Negative for bladder incontinence, difficulty urinating, dysuria, frequency, hematuria and nocturia.   Musculoskeletal:  Positive for back pain. Negative for arthralgias, flank pain, myalgias and neck pain.  Skin:  Negative for itching and rash.  Neurological:  Negative for dizziness, headaches and numbness.  Hematological:  Does not bruise/bleed easily.  Psychiatric/Behavioral:  Positive for depression. Negative for sleep disturbance and suicidal ideas. The patient is nervous/anxious.   All other systems reviewed and are negative.    VITALS:   Blood pressure 105/75, pulse 89, temperature 97.8 F (36.6 C), temperature source Oral, resp. rate 19, height 5\' 10"  (1.778 m), weight 146 lb 3.2 oz (66.3 kg), SpO2 95%.  Wt Readings from Last 3 Encounters:  10/15/23 146 lb 3.2 oz (66.3 kg)  09/12/23 149 lb 12.8 oz (67.9 kg)  06/20/23 151 lb 4.8 oz (68.6 kg)    Body mass index is 20.98 kg/m.  Performance status (ECOG): 1 - Symptomatic but completely ambulatory  PHYSICAL EXAM:   Physical Exam Vitals and nursing note reviewed. Exam conducted with a chaperone present.  Constitutional:      Appearance: Normal appearance.  Cardiovascular:     Rate and Rhythm: Normal rate and regular rhythm.     Pulses: Normal pulses.     Heart sounds: Normal heart sounds.  Pulmonary:      Effort: Pulmonary effort is normal.     Breath sounds: Normal breath sounds.  Abdominal:     Palpations: Abdomen is soft. There is no hepatomegaly, splenomegaly or mass.     Tenderness: There is no abdominal tenderness.  Musculoskeletal:     Right lower leg: No edema.     Left lower leg: No edema.  Lymphadenopathy:     Cervical: No cervical adenopathy.     Right cervical: No superficial, deep or posterior cervical adenopathy.    Left cervical: No superficial, deep or posterior cervical adenopathy.     Upper Body:     Right upper body: No supraclavicular or axillary adenopathy.     Left upper body: No supraclavicular or axillary adenopathy.  Neurological:     General: No focal deficit present.     Mental Status: He is alert and oriented to person, place, and time.  Psychiatric:        Mood and Affect: Mood normal.        Behavior: Behavior normal.     LABS:      Latest Ref Rng & Units 10/08/2023   10:51 AM 06/12/2023    2:50 PM 03/06/2023    2:11 PM  CBC  WBC 4.0 - 10.5 K/uL 10.1  7.7  8.0   Hemoglobin 13.0 - 17.0 g/dL 62.1  30.8  65.7   Hematocrit 39.0 - 52.0 % 46.4  44.4  43.8   Platelets 150 - 400 K/uL 197  192  156       Latest Ref Rng & Units 10/08/2023   10:51 AM 06/12/2023    2:50 PM 03/06/2023    2:11 PM  CMP  Glucose 70 - 99 mg/dL 846  962  952   BUN 8 - 23 mg/dL 18  21  12   Creatinine 0.61 - 1.24 mg/dL 1.02  7.25  3.66   Sodium 135 - 145 mmol/L 136  137  139   Potassium 3.5 - 5.1 mmol/L 3.8  4.3  4.7   Chloride 98 - 111 mmol/L 100  101  103   CO2 22 - 32 mmol/L 27  27  25    Calcium 8.9 - 10.3 mg/dL 9.1  9.2  9.3   Total Protein 6.5 - 8.1 g/dL 7.4  6.9  7.4   Total Bilirubin 0.3 - 1.2 mg/dL 0.4  0.7  0.5   Alkaline Phos 38 - 126 U/L 70  61  70   AST 15 - 41 U/L 20  18  22    ALT 0 - 44 U/L 20  27  22       No results found for: "CEA1", "CEA" / No results found for: "CEA1", "CEA" Lab Results  Component Value Date   PSA1 1.1 09/04/2023   No results  found for: "YQI347" No results found for: "CAN125"  No results found for: "TOTALPROTELP", "ALBUMINELP", "A1GS", "A2GS", "BETS", "BETA2SER", "GAMS", "MSPIKE", "SPEI" No results found for: "TIBC", "FERRITIN", "IRONPCTSAT" No results found for: "LDH"   STUDIES:   No results found.

## 2023-10-15 NOTE — Progress Notes (Signed)
Per chart, patient may have therapy change. Ben H. aware that patient is paused.

## 2023-10-25 ENCOUNTER — Ambulatory Visit: Payer: Medicare Other | Admitting: Hematology

## 2023-10-25 ENCOUNTER — Encounter (HOSPITAL_COMMUNITY)
Admission: RE | Admit: 2023-10-25 | Discharge: 2023-10-25 | Disposition: A | Discharge status: Home / Self Care | Payer: Medicare Other | Source: Ambulatory Visit | Attending: Hematology | Admitting: Hematology

## 2023-10-25 DIAGNOSIS — C775 Secondary and unspecified malignant neoplasm of intrapelvic lymph nodes: Secondary | ICD-10-CM | POA: Diagnosis not present

## 2023-10-25 DIAGNOSIS — C61 Malignant neoplasm of prostate: Secondary | ICD-10-CM | POA: Insufficient documentation

## 2023-10-25 DIAGNOSIS — C771 Secondary and unspecified malignant neoplasm of intrathoracic lymph nodes: Secondary | ICD-10-CM | POA: Insufficient documentation

## 2023-10-25 DIAGNOSIS — C7951 Secondary malignant neoplasm of bone: Secondary | ICD-10-CM | POA: Diagnosis not present

## 2023-10-25 MED ORDER — FLOTUFOLASTAT F 18 GALLIUM 296-5846 MBQ/ML IV SOLN
8.1600 | Freq: Once | INTRAVENOUS | Status: AC
  Administered 2023-10-25: 8.16 via INTRAVENOUS
  Filled 2023-10-25: qty 9

## 2023-11-01 ENCOUNTER — Inpatient Hospital Stay: Payer: Medicare Other | Attending: Genetics | Admitting: Hematology

## 2023-11-01 VITALS — BP 115/85 | HR 95 | Temp 97.7°F | Resp 20 | Ht 70.0 in | Wt 150.4 lb

## 2023-11-01 DIAGNOSIS — M8580 Other specified disorders of bone density and structure, unspecified site: Secondary | ICD-10-CM | POA: Diagnosis not present

## 2023-11-01 DIAGNOSIS — Z923 Personal history of irradiation: Secondary | ICD-10-CM | POA: Insufficient documentation

## 2023-11-01 DIAGNOSIS — C771 Secondary and unspecified malignant neoplasm of intrathoracic lymph nodes: Secondary | ICD-10-CM

## 2023-11-01 DIAGNOSIS — C775 Secondary and unspecified malignant neoplasm of intrapelvic lymph nodes: Secondary | ICD-10-CM

## 2023-11-01 DIAGNOSIS — M25512 Pain in left shoulder: Secondary | ICD-10-CM | POA: Insufficient documentation

## 2023-11-01 DIAGNOSIS — C61 Malignant neoplasm of prostate: Secondary | ICD-10-CM

## 2023-11-01 DIAGNOSIS — Z85118 Personal history of other malignant neoplasm of bronchus and lung: Secondary | ICD-10-CM | POA: Insufficient documentation

## 2023-11-01 DIAGNOSIS — F17210 Nicotine dependence, cigarettes, uncomplicated: Secondary | ICD-10-CM | POA: Insufficient documentation

## 2023-11-01 DIAGNOSIS — Z7952 Long term (current) use of systemic steroids: Secondary | ICD-10-CM | POA: Insufficient documentation

## 2023-11-01 DIAGNOSIS — I714 Abdominal aortic aneurysm, without rupture, unspecified: Secondary | ICD-10-CM | POA: Diagnosis not present

## 2023-11-01 DIAGNOSIS — Z803 Family history of malignant neoplasm of breast: Secondary | ICD-10-CM | POA: Insufficient documentation

## 2023-11-01 NOTE — Progress Notes (Signed)
Stephen Palmer [address] [place] [address]    Clinic Day:  11/01/2023  Referring physician: Avis Epley, PA*  Patient Care Team: Stephen Palmer as PCP - General (Family Medicine) Stephen Sidle, MD as PCP - Cardiology (Cardiology) Stephen Macho, MD as Consulting Physician (General Surgery) Stephen Massed, MD as Medical Oncologist (Medical Oncology) Stephen Sarah, RN as Oncology Nurse Navigator (Medical Oncology)   ASSESSMENT & PLAN:   Assessment: 1. 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 - Prostate IMRT from 12/13/2016 through 02/09/2017, 78 Gray in 40 fractions with ADT. - Lupron started back on 09/01/2020 for rising PSA levels. - PSMA PET scan (03/30/2022): Nodes in the retroperitoneum, right pelvis, left lower jugular/supraclavicular stations.  No bone metastasis.  No local recurrence. - Enzalutamide started on 04/03/2022, discontinued due to extreme fatigue, vision changes and jaw pain. - Apalutamide started on 05/01/2022, taking only 2 pills but experiences jaw pain and neck pain and vision changes.  Discontinued on 05/09/2022 due to poor tolerance. - XRT with Dr. Kathrynn Palmer from 05/30/2022 through 06/30/2022. -Abiraterone 1000 mg and prednisone 5 mg started on 07/06/2022, dose reduced to 750 mg daily, dose further reduced to 500 mg daily on 12/12/2022 due to severe fatigue, discontinued on 11/01/2023 due to progression of disease.  2. Social/family history: - Lives at home with his wife.  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 - Current active smoker, 2 packs/day for 55 years.  Rolls his own cigarettes. - Mother had skin cancers.  3.  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 - Definitive SBRT from 10/05/2017 through 10/12/2017, 54 Gray in 3 fractions of 18 Gray.     Plan: Metastatic CRPC to the lymph nodes: - Abiraterone was discontinued at last visit.  Prednisone is being continued at 5 mg daily dose. - We reviewed PET scan images from 10/25/2023: New diffuse bone metastasis within the skull, C-spine, thoracic spine, bilateral ribs, left scapula.  Mild mediastinal and bilateral hilar adenopathy.  Decreased activity in the mild abdominal retroperitoneal and right external iliac chain lymphadenopathy. - He reports some pain in the left shoulder region, but not significant enough to require pain medication. - We reviewed treatment options which includes chemotherapy with docetaxel.  We discussed side effects of docetaxel in detail.  We have given literature about it.  We also discussed best supportive care without any therapy option. - If he decides to proceed with chemo option, he will need port placement. - He would like to think about it and call us back his attention.   2.  Osteopenia (DEXA scan on 05/17/2022 T score -2.3): - Denosumab to decrease SRE was previously discussed and he denied it. - Continue calcium and vitamin D supplements.    No orders of the defined types were placed in this encounter.     Stephen Palmer,acting as a Neurosurgeon for Stephen Massed, MD.,have documented all relevant documentation on the behalf of Stephen Massed, MD,as directed by  Stephen Massed, MD while in the presence of Stephen Massed, MD.  I, Stephen Massed MD, have reviewed the above documentation for accuracy and completeness, and I agree with the above.    Stephen Massed, MD   11/14/202412:57 PM  CHIEF COMPLAINT:   Diagnosis: castration refractory prostate cancer to the lymph nodes    Cancer Staging  Prostate cancer metastatic  to intrapelvic lymph node (HCC) Staging form: Prostate, AJCC 7th Edition - Clinical: Stage IV (T1c, N1, M1a, PSA: Less than 10, Gleason 8-10) - Unsigned    Prior Therapy:  Prostate IMRT from  12/13/2016 through 02/09/2017, 78 Gray in 40 fractions with ADT  Enzalutamide started on 04/03/2022, discontinued due to extreme fatigue, vision changes and jaw pain Apalutamide started on 05/01/2022, taking only 2 pills but experiences jaw pain and neck pain and vision changes.  Discontinued on 05/09/2022 due to poor tolerance XRT with Dr. Kathrynn Palmer from 05/30/2022 through 06/30/2022  Abiraterone 1000 mg and prednisone 5 mg started on 07/06/2022   Current Therapy:  Lupron every 6 months- started 09/01/2020    HISTORY OF PRESENT ILLNESS:   Oncology History  Prostate cancer metastatic to intrapelvic lymph node (HCC)  09/29/2016 Initial Diagnosis   Prostate cancer metastatic to intrapelvic lymph node (HCC)    Genetic Testing   Negative genetic testing. No pathogenic variants identified on the Common Hereditary Cancers+RNA panel. The report date is 06/23/2022.  The Common Hereditary Cancers Panel + RNA offered by [facility] includes sequencing and/or deletion duplication testing of the following 47 genes: APC, ATM, AXIN2, BARD1, BMPR1A, BRCA1, BRCA2, BRIP1, CDH1, CDKN2A (p14ARF), CDKN2A (p16INK4a), CKD4, CHEK2, CTNNA1, DICER1, EPCAM (Deletion/duplication testing only), GREM1 (promoter region deletion/duplication testing only), KIT, MEN1, MLH1, MSH2, MSH3, MSH6, MUTYH, NBN, NF1, NHTL1, PALB2, PDGFRA, PMS2, POLD1, POLE, PTEN, RAD50, RAD51C, RAD51D, SDHB, SDHC, SDHD, SMAD4, SMARCA4. STK11, TP53, TSC1, TSC2, and VHL.  The following genes were evaluated for sequence changes only: SDHA and HOXB13 c.251G>A variant only.      INTERVAL HISTORY:   Stephen Palmer is a 72 y.o. male presenting to clinic today for follow up of castration refractory prostate cancer to the lymph nodes. He was last seen by me on 10/15/23.  Since his last visit, he underwent PSMA on 10/25/23 that found: new intense radiotracer accumulation in mild mediastinal and bilateral hilar lymphadenopathy, consistent with metastatic disease; new diffuse  osseous metastatic disease within the skull, cervical and thoracic spine, bilateral ribs and left scapula; decreased radiotracer activity in mild abdominal retroperitoneal and right external iliac chain lymphadenopathy; stable mild radiotracer accumulation within the prostate; and stable 6.0 cm abdominal aortic aneurysm.  Today, he states that he is doing well overall. His appetite level is at 80%. His energy level is at 0%. He is accompanied by his wife. He reports pain in back and sides are stable. He has not received surgery for the abdominal aneurysm and does not wish to. He c/o "pressure" on the brain, particularly at night and when the weather changes. He believes he has a bacterial infection on an artery leading to the right kidney.   PAST MEDICAL HISTORY:   Past Medical History: Past Medical History:  Diagnosis Date   Abdominal aortic aneurysm (AAA) (HCC)    Anxiety    COPD (chronic obstructive pulmonary disease) (HCC)    Coronary artery calcification seen on CT scan    Essential hypertension    Lung cancer (HCC)    Non small cell carcinoma - XRT   Non-small cell carcinoma of left lung, stage 1 (HCC) 09/21/2017   Prostate cancer (HCC)    XRT   Stroke (HCC)    Type 2 diabetes mellitus (HCC)     Surgical History: Past Surgical History:  Procedure Laterality Date   COLONOSCOPY N/A 11/23/2015   Procedure: COLONOSCOPY;  Surgeon: Stephen Macho, MD;  Location: [facility];  Service: Gastroenterology;  Laterality: N/A;  FRACTURE SURGERY     PROSTATE BIOPSY     SINUS SURGERY WITH INSTATRAK      Social History: Social History   Socioeconomic History   Marital status: Married    Spouse name: Not on file   Number of children: Not on file   Years of education: Not on file   Highest education level: Not on file  Occupational History   Not on file  Tobacco Use   Smoking status: Every Day    Current packs/day: 2.00    Average packs/day: 2.0 packs/day for 44.0 years (88.0 ttl  pk-yrs)    Types: Cigarettes   Smokeless tobacco: Never  Vaping Use   Vaping status: Never Used  Substance and Sexual Activity   Alcohol use: No    Alcohol/week: 0.0 standard drinks of alcohol   Drug use: No   Sexual activity: Not on file  Other Topics Concern   Not on file  Social History Narrative   Not on file   Social Determinants of Health   Financial Resource Strain: Not on file  Food Insecurity: No Food Insecurity (05/01/2023)   Hunger Vital Sign    Worried About Palmer Out of Food in the Last Year: Never true    Ran Out of Food in the Last Year: Never true  Transportation Needs: No Transportation Needs (05/01/2023)   PRAPARE - Administrator, Civil Service (Medical): No    Lack of Transportation (Non-Medical): No  Physical Activity: Not on file  Stress: Not on file  Social Connections: Not on file  Intimate Partner Violence: Not At Risk (05/01/2023)   Humiliation, Afraid, Rape, and Kick questionnaire    Fear of Current or Ex-Partner: No    Emotionally Abused: No    Physically Abused: No    Sexually Abused: No    Family History: Family History  Problem Relation Age of Onset   Heart disease Mother    Skin cancer Mother    Heart disease Father        before age 14   Skin cancer Brother    Breast cancer Maternal Aunt    Cancer Neg Hx     Current Medications:  Current Outpatient Medications:    alfuzosin (UROXATRAL) 10 MG 24 hr tablet, Take 1 tablet (10 mg total) by mouth daily with breakfast., Disp: 30 tablet, Rfl: 11   alprazolam (XANAX) 2 MG tablet, Take 2 mg by mouth 2 (two) times daily., Disp: , Rfl:    Blood Glucose Monitoring Suppl (ACCU-CHEK GUIDE ME) w/Device KIT, 1 Piece by Does not apply route as directed., Disp: 1 kit, Rfl: 0   citalopram (CELEXA) 40 MG tablet, Take 20-40 mg by mouth daily., Disp: , Rfl:    glipiZIDE 2.5 MG TABS, Take 2.5 mg by mouth daily before breakfast., Disp: 90 tablet, Rfl: 1   glucose blood (ACCU-CHEK GUIDE) test  strip, Use to test glucose 4 times a day., Disp: 200 each, Rfl: 2   multivitamin (ONE-A-DAY MEN'S) TABS tablet, Take 1 tablet by mouth daily., Disp: , Rfl:    predniSONE (DELTASONE) 10 MG tablet, TAKE 1 TABLET BY MOUTH DAILY WITH breakfast, Disp: 30 tablet, Rfl: 6   rosuvastatin (CRESTOR) 10 MG tablet, TAKE 1 TABLET BY MOUTH DAILY, Disp: 90 tablet, Rfl: 3  Current Facility-Administered Medications:    leuprolide (6 Month) (ELIGARD) injection 45 mg, 45 mg, Subcutaneous, Q6 months, , 45 mg at 09/12/23 1447   Allergies: Allergies  Allergen Reactions   Sulfa  Antibiotics Hives, Itching and Swelling    Tongue swelling   Sulfasalazine Hives, Itching and Swelling    Tongue swelling   Lipitor [Atorvastatin] Other (See Comments)    myalgia   Simvastatin Other (See Comments)    myalgia   Iodinated Contrast Media Nausea And Vomiting    REVIEW OF SYSTEMS:   Review of Systems  Constitutional:  Negative for chills, fatigue and fever.  HENT:   Positive for trouble swallowing. Negative for lump/mass, mouth sores, nosebleeds and sore throat.   Eyes:  Negative for eye problems.  Respiratory:  Negative for cough and shortness of breath.   Cardiovascular:  Negative for chest pain, leg swelling and palpitations.  Gastrointestinal:  Negative for abdominal pain, constipation, diarrhea, nausea and vomiting.  Genitourinary:  Positive for dysuria. Negative for bladder incontinence, difficulty urinating, frequency, hematuria and nocturia.   Musculoskeletal:  Positive for back pain (5/10 severity) and flank pain (bilateral, 5/10 severity). Negative for arthralgias, myalgias and neck pain.  Skin:  Negative for itching and rash.  Neurological:  Negative for dizziness, headaches and numbness.  Hematological:  Does not bruise/bleed easily.  Psychiatric/Behavioral:  Positive for depression. Negative for sleep disturbance and suicidal ideas. The patient is nervous/anxious.   All other systems reviewed and are  negative.    VITALS:   Blood pressure 115/85, pulse 95, temperature 97.7 F (36.5 C), temperature source Oral, resp. rate 20, height 5\' 10"  (1.778 m), weight 150 lb 6.4 oz (68.2 kg), SpO2 96%.  Wt Readings from Last 3 Encounters:  11/01/23 150 lb 6.4 oz (68.2 kg)  10/15/23 146 lb 3.2 oz (66.3 kg)  09/12/23 149 lb 12.8 oz (67.9 kg)    Body mass index is 21.58 kg/m.  Performance status (ECOG): 1 - Symptomatic but completely ambulatory  PHYSICAL EXAM:   Physical Exam Vitals and nursing note reviewed. Exam conducted with a chaperone present.  Constitutional:      Appearance: Normal appearance.  Cardiovascular:     Rate and Rhythm: Normal rate and regular rhythm.     Pulses: Normal pulses.     Heart sounds: Normal heart sounds.  Pulmonary:     Effort: Pulmonary effort is normal.     Breath sounds: Normal breath sounds.  Abdominal:     Palpations: Abdomen is soft. There is no hepatomegaly, splenomegaly or mass.     Tenderness: There is no abdominal tenderness.  Musculoskeletal:     Right lower leg: No edema.     Left lower leg: No edema.  Lymphadenopathy:     Cervical: No cervical adenopathy.     Right cervical: No superficial, deep or posterior cervical adenopathy.    Left cervical: No superficial, deep or posterior cervical adenopathy.     Upper Body:     Right upper body: No supraclavicular or axillary adenopathy.     Left upper body: No supraclavicular or axillary adenopathy.  Neurological:     General: No focal deficit present.     Mental Status: He is alert and oriented to person, place, and time.  Psychiatric:        Mood and Affect: Mood normal.        Behavior: Behavior normal.     LABS:      Latest Ref Rng & Units 10/08/2023   10:51 AM 06/12/2023    2:50 PM 03/06/2023    2:11 PM  CBC  WBC 4.0 - 10.5 K/uL 10.1  7.7  8.0   Hemoglobin 13.0 - 17.0 g/dL 15.0  14.6  14.5   Hematocrit 39.0 - 52.0 % 46.4  44.4  43.8   Platelets 150 - 400 K/uL 197  192  156        Latest Ref Rng & Units 10/08/2023   10:51 AM 06/12/2023    2:50 PM 03/06/2023    2:11 PM  CMP  Glucose 70 - 99 mg/dL 161  096  045   BUN 8 - 23 mg/dL 18  21  12    Creatinine 0.61 - 1.24 mg/dL 4.09  8.11  9.14   Sodium 135 - 145 mmol/L 136  137  139   Potassium 3.5 - 5.1 mmol/L 3.8  4.3  4.7   Chloride 98 - 111 mmol/L 100  101  103   CO2 22 - 32 mmol/L 27  27  25    Calcium 8.9 - 10.3 mg/dL 9.1  9.2  9.3   Total Protein 6.5 - 8.1 g/dL 7.4  6.9  7.4   Total Bilirubin 0.3 - 1.2 mg/dL 0.4  0.7  0.5   Alkaline Phos 38 - 126 U/L 70  61  70   AST 15 - 41 U/L 20  18  22    ALT 0 - 44 U/L 20  27  22       No results found for: "CEA1", "CEA" / No results found for: "CEA1", "CEA" Lab Results  Component Value Date   PSA1 1.1 09/04/2023   No results found for: "NWG956" No results found for: "CAN125"  No results found for: "TOTALPROTELP", "ALBUMINELP", "A1GS", "A2GS", "BETS", "BETA2SER", "GAMS", "MSPIKE", "SPEI" No results found for: "TIBC", "FERRITIN", "IRONPCTSAT" No results found for: "LDH"   STUDIES:   NM PET (PSMA) SKULL TO MID THIGH  Result Date: 11/01/2023 CLINICAL DATA:  Prostate carcinoma. Undergoing chemotherapy. Assess treatment response. EXAM: NUCLEAR MEDICINE PET SKULL BASE TO THIGH TECHNIQUE: 8.2 mCi Flotufolastat (Posluma) was injected intravenously. Full-ring PET imaging was performed from the skull base to thigh after the radiotracer. CT data was obtained and used for attenuation correction and anatomic localization. COMPARISON:  Pylarify PET-CT on 04/26/2023 FINDINGS: NECK No radiotracer activity in neck lymph nodes. Incidental CT finding: None. CHEST New intense radiotracer accumulation is seen in mild mediastinal lymphadenopathy and bilateral hilar lymphadenopathy, right side greater than left. No suspicious pulmonary nodules on the CT scan. Incidental CT finding: Bullous emphysema again seen with stable scarring and traction bronchiectasis in the left upper lobe.  ABDOMEN/PELVIS Prostate: Small prostate gland continues to show mild radiotracer accumulation, similar to prior exam. Lymph nodes: Radiotracer accumulation in mild lymphadenopathy in the retrocaval space and proximal right external iliac chain shows mild improvement since prior study. No new sites of abnormal lymph node activity are identified in the abdomen or pelvis. Liver: No evidence of liver metastasis. Incidental CT finding: 6.0 cm abdominal aortic aneurysm shows no significant change. Colonic diverticulosis again noted, without signs of diverticulitis. SKELETON Numerous new sites of intense radiotracer accumulation are seen within the skull, cervical and thoracic spine, bilateral ribs left scapula, consistent with new osseous metastatic disease. IMPRESSION: New intense radiotracer accumulation in mild mediastinal and bilateral hilar lymphadenopathy, consistent with metastatic disease. New diffuse osseous metastatic disease within the skull, cervical and thoracic spine, bilateral ribs and left scapula. Decreased radiotracer activity in mild abdominal retroperitoneal and right external iliac chain lymphadenopathy. Stable mild radiotracer accumulation within the prostate. Stable 6.0 cm abdominal aortic aneurysm. Recommend follow-up CT or MR as appropriate in 6 months and referral to or continued care with vascular  specialist. (Ref.: J Vasc Surg. 2018; 67:2-77 and J Am Coll Radiol 2013;10(10):789-794.) Emphysema (ICD10-J43.9). Electronically Signed   By: Danae Orleans M.D.   On: 11/01/2023 11:53

## 2023-11-01 NOTE — Patient Instructions (Addendum)
[facility] at [facility] Discharge Instructions   You were seen and examined today by Dr. Ellin Saba.  He reviewed the results of your PET scan. It is showing multiple areas of cancer in the bones in the skull, spine, ribs, and sternum. There are also lymph nodes lighting up in the chest and pelvis. This means the cancer has gotten worse.   The next option for treatment would be chemotherapy. There are no set number of treatments for the chemotherapy. The chemotherapy drug is called Taxotere. It is given in the clinic every 3 weeks.   You will need a port a cath placed to have chemotherapy administered. They will do this in interventional radiology in [place].   Call us and let us know how you would like to proceed.        Thank you for choosing [facility] at [facility] to provide your oncology and hematology care.  To afford each patient quality time with our provider, please arrive at least 15 minutes before your scheduled appointment time.   If you have a lab appointment with the Cancer Center please come in thru the Main Entrance and check in at the main information desk.  You need to re-schedule your appointment should you arrive 10 or more minutes late.  We strive to give you quality time with our providers, and arriving late affects you and other patients whose appointments are after yours.  Also, if you no show three or more times for appointments you may be dismissed from the clinic at the providers discretion.     Again, thank you for choosing [facility].  Our hope is that these requests will decrease the amount of time that you wait before being seen by our physicians.       _____________________________________________________________  Should you have questions after your visit to [facility], please contact our office at [phone] and follow the prompts.  Our office hours are 8:00 a.m. and 4:30  p.m. Monday - Friday.  Please note that voicemails left after 4:00 p.m. may not be returned until the following business day.  We are closed weekends and major holidays.  You do have access to a nurse 24-7, just call the main number to the clinic [phone] and do not press any options, hold on the line and a nurse will answer the phone.    For prescription refill requests, have your pharmacy contact our office and allow 72 hours.    Due to Covid, you will need to wear a mask upon entering the hospital. If you do not have a mask, a mask will be given to you at the Main Entrance upon arrival. For doctor visits, patients may have 1 support person age 31 or older with them. For treatment visits, patients can not have anyone with them due to social distancing guidelines and our immunocompromised population.

## 2023-11-05 ENCOUNTER — Other Ambulatory Visit: Payer: Self-pay | Admitting: *Deleted

## 2023-11-05 DIAGNOSIS — C61 Malignant neoplasm of prostate: Secondary | ICD-10-CM

## 2023-11-07 ENCOUNTER — Telehealth: Payer: Self-pay

## 2023-11-07 NOTE — Telephone Encounter (Signed)
Patient wanted to let Dr. Ronne Binning to know he will be having a port put in on Dec 3 with [facility] on Dec 9.  Any advice or questions call  [phone]

## 2023-11-07 NOTE — Telephone Encounter (Signed)
MD verbally notified.

## 2023-11-17 ENCOUNTER — Other Ambulatory Visit: Payer: Self-pay | Admitting: Radiology

## 2023-11-19 ENCOUNTER — Other Ambulatory Visit: Payer: Self-pay | Admitting: Radiology

## 2023-11-20 ENCOUNTER — Ambulatory Visit (HOSPITAL_COMMUNITY)
Admission: RE | Admit: 2023-11-20 | Discharge: 2023-11-20 | Disposition: A | Discharge status: Home / Self Care | Payer: Medicare Other | Source: Ambulatory Visit | Attending: Hematology | Admitting: Hematology

## 2023-11-20 ENCOUNTER — Other Ambulatory Visit: Payer: Self-pay

## 2023-11-20 ENCOUNTER — Other Ambulatory Visit: Payer: Self-pay | Admitting: Hematology

## 2023-11-20 ENCOUNTER — Other Ambulatory Visit (HOSPITAL_COMMUNITY): Payer: Self-pay | Admitting: Hematology

## 2023-11-20 ENCOUNTER — Encounter (HOSPITAL_COMMUNITY): Payer: Self-pay | Admitting: Hematology

## 2023-11-20 DIAGNOSIS — C61 Malignant neoplasm of prostate: Secondary | ICD-10-CM | POA: Diagnosis not present

## 2023-11-20 DIAGNOSIS — C771 Secondary and unspecified malignant neoplasm of intrathoracic lymph nodes: Secondary | ICD-10-CM | POA: Diagnosis not present

## 2023-11-20 DIAGNOSIS — F17210 Nicotine dependence, cigarettes, uncomplicated: Secondary | ICD-10-CM | POA: Diagnosis not present

## 2023-11-20 HISTORY — PX: IR IMAGING GUIDED PORT INSERTION: IMG5740

## 2023-11-20 LAB — GLUCOSE, CAPILLARY: Glucose-Capillary: 108 mg/dL — ABNORMAL HIGH (ref 70–99)

## 2023-11-20 MED ORDER — LIDOCAINE-EPINEPHRINE 1 %-1:100000 IJ SOLN
INTRAMUSCULAR | Status: AC
Start: 2023-11-20 — End: ?
  Filled 2023-11-20: qty 1

## 2023-11-20 MED ORDER — LIDOCAINE-EPINEPHRINE 1 %-1:100000 IJ SOLN
20.0000 mL | Freq: Once | INTRAMUSCULAR | Status: AC
  Administered 2023-11-20: 20 mL via INTRADERMAL

## 2023-11-20 MED ORDER — SODIUM CHLORIDE 0.9% FLUSH: 10.0000 mL | Freq: Two times a day (BID) | INTRAVENOUS | Status: DC

## 2023-11-20 MED ORDER — HEPARIN SOD (PORK) LOCK FLUSH 100 UNIT/ML IV SOLN
INTRAVENOUS | Status: AC
  Filled 2023-11-20: qty 5

## 2023-11-20 MED ORDER — HEPARIN SOD (PORK) LOCK FLUSH 10 UNIT/ML IV SOLN: 10.0000 [IU] | Freq: Once | INTRAVENOUS | Status: DC

## 2023-11-20 NOTE — H&P (Signed)
Chief Complaint: NSCL (left) in need of durable access for chemotherapy. Request is for portacath placement  Referring Physician(s): Katragadda,Sreedhar  Supervising Physician: Richarda Overlie  Patient Status: [facility] - Out-pt  History of Present Illness: Stephen Palmer. is a 72 y.o. male  outpatient. History of DM, CVA, HTN, COPD, CAD, AAA, Non small cell carcinoma (left), prostate cancer. Team is requesting a portacath for chemotherapy access.   Wife at bedside. Currently without any significant complaints. Patient alert and laying in bed,calm. Denies any fevers, headache, chest pain, SOB, cough, abdominal pain, nausea, vomiting or bleeding.   Return precautions and treatment recommendations and follow-up discussed with the patient and his wier. Both who are agreeable with the plan.  Per EPIC patient has no line history. PET scan from 111.7.24 shows RIJ is accessible. No recent labs, all medications are within acceptable parameters. Allergies inlcude sulfa and iodinated contrast media reaction is nausea and vomiting. Patient has been NPO since midnight. Patient does not have a driver for the procedure. After detailed discussion with the patient and his wife the patient has elected to proceed with the procedure using local only.    Past Medical History:  Diagnosis Date   Abdominal aortic aneurysm (AAA) (HCC)    Anxiety    COPD (chronic obstructive pulmonary disease) (HCC)    Coronary artery calcification seen on CT scan    Essential hypertension    Lung cancer (HCC)    Non small cell carcinoma - XRT   Non-small cell carcinoma of left lung, stage 1 (HCC) 09/21/2017   Prostate cancer (HCC)    XRT   Stroke (HCC)    Type 2 diabetes mellitus (HCC)     Past Surgical History:  Procedure Laterality Date   COLONOSCOPY N/A 11/23/2015   Procedure: COLONOSCOPY;  Surgeon: Franky Macho, MD;  Location: [facility];  Service: Gastroenterology;  Laterality: N/A;   FRACTURE SURGERY      PROSTATE BIOPSY     SINUS SURGERY WITH INSTATRAK      Allergies: Sulfa antibiotics, Sulfasalazine, Lipitor [atorvastatin], Simvastatin, and Iodinated contrast media  Medications: Prior to Admission medications   Medication Sig Start Date End Date Taking? Authorizing Provider  alfuzosin (UROXATRAL) 10 MG 24 hr tablet Take 1 tablet (10 mg total) by mouth daily with breakfast. 06/15/23  Yes McKenzie, Mardene Celeste, MD  citalopram (CELEXA) 40 MG tablet Take 20-40 mg by mouth daily. 02/21/22  Yes [provider]  glipiZIDE 2.5 MG TABS Take 2.5 mg by mouth daily before breakfast. 09/12/23  Yes Nida, Denman George, MD  glucose blood (ACCU-CHEK GUIDE) test strip Use to test glucose 4 times a day. 05/16/22  Yes Nida, Denman George, MD  multivitamin (ONE-A-DAY MEN'S) TABS tablet Take 1 tablet by mouth daily.   Yes [provider]  rosuvastatin (CRESTOR) 10 MG tablet TAKE 1 TABLET BY MOUTH DAILY 08/09/23  Yes McKenzie, Mardene Celeste, MD  alprazolam Prudy Feeler) 2 MG tablet Take 2 mg by mouth 2 (two) times daily.    [provider]  Blood Glucose Monitoring Suppl (ACCU-CHEK GUIDE ME) w/Device KIT 1 Piece by Does not apply route as directed. 05/16/22   Roma Kayser, MD  predniSONE (DELTASONE) 10 MG tablet TAKE 1 TABLET BY MOUTH DAILY WITH breakfast 11/20/23   Doreatha Massed, MD     Family History  Problem Relation Age of Onset   Heart disease Mother    Skin cancer Mother    Heart disease Father  before age 70   Skin cancer Brother    Breast cancer Maternal Aunt    Cancer Neg Hx     Social History   Socioeconomic History   Marital status: Married    Spouse name: Not on file   Number of children: Not on file   Years of education: Not on file   Highest education level: Not on file  Occupational History   Not on file  Tobacco Use   Smoking status: Every Day    Current packs/day: 2.00    Average packs/day: 2.0 packs/day for 44.0 years (88.0 ttl pk-yrs)     Types: Cigarettes   Smokeless tobacco: Never  Vaping Use   Vaping status: Never Used  Substance and Sexual Activity   Alcohol use: No    Alcohol/week: 0.0 standard drinks of alcohol   Drug use: No   Sexual activity: Not on file  Other Topics Concern   Not on file  Social History Narrative   Not on file   Social Determinants of Health   Financial Resource Strain: Not on file  Food Insecurity: No Food Insecurity (05/01/2023)   Hunger Vital Sign    Worried About Running Out of Food in the Last Year: Never true    Ran Out of Food in the Last Year: Never true  Transportation Needs: No Transportation Needs (05/01/2023)   PRAPARE - Administrator, Civil Service (Medical): No    Lack of Transportation (Non-Medical): No  Physical Activity: Not on file  Stress: Not on file  Social Connections: Not on file     Review of Systems: A 12 point ROS discussed and pertinent positives are indicated in the HPI above.  All other systems are negative.  Review of Systems  Constitutional:  Negative for fever.  HENT:  Negative for congestion.   Respiratory:  Negative for cough and shortness of breath.   Cardiovascular:  Negative for chest pain.  Gastrointestinal:  Negative for abdominal pain.  Neurological:  Negative for headaches.  Psychiatric/Behavioral:  Negative for behavioral problems and confusion.     Vital Signs: BP 138/73 (BP Location: Left Arm)   Pulse 72   Temp 98.9 F (37.2 C) (Oral)   Resp 20   SpO2 94%   Advance Care Plan: The advanced care plan/surrogate decision maker was discussed at the time of visit and documented in the medical record. Wwife.    Physical Exam Vitals and nursing note reviewed.  Constitutional:      Appearance: He is well-developed.  HENT:     Head: Normocephalic.     Mouth/Throat:     Mouth: Mucous membranes are dry.  Cardiovascular:     Rate and Rhythm: Normal rate and regular rhythm.  Pulmonary:     Effort: Pulmonary effort is  normal.  Musculoskeletal:        General: Normal range of motion.     Cervical back: Normal range of motion.  Skin:    General: Skin is warm and dry.  Neurological:     General: No focal deficit present.     Mental Status: He is alert and oriented to person, place, and time. Mental status is at baseline.  Psychiatric:        Mood and Affect: Mood normal.        Behavior: Behavior normal.        Thought Content: Thought content normal.     Imaging: NM PET (PSMA) SKULL TO MID THIGH  Result Date:  11/01/2023 CLINICAL DATA:  Prostate carcinoma. Undergoing chemotherapy. Assess treatment response. EXAM: NUCLEAR MEDICINE PET SKULL BASE TO THIGH TECHNIQUE: 8.2 mCi Flotufolastat (Posluma) was injected intravenously. Full-ring PET imaging was performed from the skull base to thigh after the radiotracer. CT data was obtained and used for attenuation correction and anatomic localization. COMPARISON:  Pylarify PET-CT on 04/26/2023 FINDINGS: NECK No radiotracer activity in neck lymph nodes. Incidental CT finding: None. CHEST New intense radiotracer accumulation is seen in mild mediastinal lymphadenopathy and bilateral hilar lymphadenopathy, right side greater than left. No suspicious pulmonary nodules on the CT scan. Incidental CT finding: Bullous emphysema again seen with stable scarring and traction bronchiectasis in the left upper lobe. ABDOMEN/PELVIS Prostate: Small prostate gland continues to show mild radiotracer accumulation, similar to prior exam. Lymph nodes: Radiotracer accumulation in mild lymphadenopathy in the retrocaval space and proximal right external iliac chain shows mild improvement since prior study. No new sites of abnormal lymph node activity are identified in the abdomen or pelvis. Liver: No evidence of liver metastasis. Incidental CT finding: 6.0 cm abdominal aortic aneurysm shows no significant change. Colonic diverticulosis again noted, without signs of diverticulitis. SKELETON  Numerous new sites of intense radiotracer accumulation are seen within the skull, cervical and thoracic spine, bilateral ribs left scapula, consistent with new osseous metastatic disease. IMPRESSION: New intense radiotracer accumulation in mild mediastinal and bilateral hilar lymphadenopathy, consistent with metastatic disease. New diffuse osseous metastatic disease within the skull, cervical and thoracic spine, bilateral ribs and left scapula. Decreased radiotracer activity in mild abdominal retroperitoneal and right external iliac chain lymphadenopathy. Stable mild radiotracer accumulation within the prostate. Stable 6.0 cm abdominal aortic aneurysm. Recommend follow-up CT or MR as appropriate in 6 months and referral to or continued care with vascular specialist. (Ref.: J Vasc Surg. 2018; 67:2-77 and J Am Coll Radiol 2013;10(10):789-794.) Emphysema (ICD10-J43.9). Electronically Signed   By: Danae Orleans M.D.   On: 11/01/2023 11:53    Labs:  CBC: Recent Labs    12/04/22 0956 03/06/23 1411 06/12/23 1450 10/08/23 1051  WBC 8.0 8.0 7.7 10.1  HGB 13.7 14.5 14.6 15.0  HCT 41.6 43.8 44.4 46.4  PLT 193 156 192 197    COAGS: No results for input(s): "INR", "APTT" in the last 8760 hours.  BMP: Recent Labs    12/04/22 0956 03/06/23 1411 06/12/23 1450 10/08/23 1051  NA 138 139 137 136  K 4.3 4.7 4.3 3.8  CL 103 103 101 100  CO2 28 25 27 27   GLUCOSE 103* 124* 115* 147*  BUN 16 12 21 18   CALCIUM 9.3 9.3 9.2 9.1  CREATININE 1.11 1.17 1.17 1.17  GFRNONAA >60 >60 >60 >60    LIVER FUNCTION TESTS: Recent Labs    12/04/22 0956 03/06/23 1411 06/12/23 1450 10/08/23 1051  BILITOT 0.4 0.5 0.7 0.4  AST 19 22 18 20   ALT 19 22 27 20   ALKPHOS 74 70 61 70  PROT 7.4 7.4 6.9 7.4  ALBUMIN 4.1 4.3 4.3 4.3    Assessment and Plan:  72 y.o,. male. outpatient. History of DM, CVA, HTN, COPD, CAD, AAA, Non small cell carcinoma (left), prostate cancer. Team is requesting a portacath for  chemotherapy access.   PLAN: Portacath placement with local only  Risks and benefits of image guided port-a-catheter placement was discussed with the patient including, but not limited to bleeding, infection, pneumothorax, or fibrin sheath development and need for additional procedures.  All of the patient's questions were answered, patient is agreeable to proceed.  Consent signed and in chart.   Thank you for this interesting consult.  I greatly enjoyed meeting Stephen Palmer. and look forward to participating in their care.  A copy of this report was sent to the requesting provider on this date.  Electronically Signed: Alene Mires, NP 11/20/2023, 8:43 AM   I spent a total of  30 Minutes   in face to face in clinical consultation, greater than 50% of which was counseling/coordinating care for portacath placement

## 2023-11-20 NOTE — Procedures (Signed)
Interventional Radiology Procedure Note  Procedure: Port placement.  Indication: Prostate Ca  Findings: Please refer to procedural dictation for full description.  Complications: None  EBL: < 10 mL  Miachel Roux, MD [phone]

## 2023-11-20 NOTE — Progress Notes (Signed)
Pt discharged home post portacath placement. Wife and pt explained instructions on site care. Pt can shower 10a 12/4 and remove dressing. Does not need to replace dressing after removing.

## 2023-11-20 NOTE — Progress Notes (Signed)
DISCONTINUE ON PATHWAY REGIMEN - Prostate     A cycle is every 12 weeks:     Leuprolide acetate        Dose Mod: None   Daily:     Bicalutamide        Dose Mod: None  **Always confirm dose/schedule in your pharmacy ordering system**  REASON: Disease Progression PRIOR TREATMENT: POS82: Radiation + Neoadjuvant/Concurrent/Adjuvant LHRH Agonist Starting 2 - 4 Months Prior to Radiation for a Total of 18 - 36 Months (+/- Nonsteroidal Antiandrogen During XRT) TREATMENT RESPONSE: Unable to Evaluate  START ON PATHWAY REGIMEN - Prostate     A cycle is every 21 days:     Prednisone      Docetaxel   **Always confirm dose/schedule in your pharmacy ordering system**  Patient Characteristics: Adenocarcinoma, Recurrent/New Systemic Disease (Including Biochemical Recurrence), Castration Resistant, M1, Prior Novel Hormonal Agent, No Molecular Alteration or Targeted Therapy Exhausted, No Prior Docetaxel Histology: Adenocarcinoma Therapeutic Status: Recurrent/New Systemic Disease (Including Biochemical Recurrence)  Intent of Therapy: Non-Curative / Palliative Intent, Discussed with Patient

## 2023-11-21 ENCOUNTER — Other Ambulatory Visit: Payer: Self-pay

## 2023-11-22 NOTE — Patient Instructions (Signed)
[facility] Chemotherapy Teaching     You are diagnosed with metastatic (Stage IV) prostate cancer.  We will treat you in the clinic every 3 weeks for a total of 6 cycles with a chemotherapy drug called docetaxel (Taxotere).   On Day 3 you will receive an injection called Stimufend.  This injections helps your body make the white blood cells you need to fight infections. The intent of treatment is to shrink and control this cancer, prevent it from spreading further, and to alleviate any symptoms you may be having related to this cancer. You will see the doctor regularly throughout treatment.  We will obtain blood work from you prior to every treatment and monitor your results to make sure it is safe to give your treatment. The doctor monitors your response to treatment by the way you are feeling, your blood work, and by obtaining scans periodically.  There will be wait times while you are here for treatment.  It will take about 30 minutes to 1 hour for your lab work to result.  Then there will be wait times while pharmacy mixes your medications.       Medications you will receive in the clinic prior to your chemotherapy medications:   Aloxi:  ALOXI is used in adults to help prevent nausea and vomiting that happens with certain chemotherapy drugs.  Aloxi is a long acting medication, and will remain in your system for about two days.    Dexamethasone:  This is a steroid given prior to chemotherapy to help prevent allergic reactions; it may also help prevent and control nausea and diarrhea.         Docetaxel (Taxotere)    About This Drug    Docetaxel is used to treat cancer. It is given in the vein (IV). It will take 1 hour to infuse.  The first infusion will take longer (about 1.5 hours) due to the fact that we start this drug at a very slow rate and gradually increase the rate of infusion until the maximum rate is reached.  This is done to monitor for infusion reactions, and to make  sure you will tolerated this drug without adverse effects.  If you tolerate the first infusion, all subsequent infusions will be started at the normal rate and given over 1 hour.   Possible Side Effects     Bone marrow suppression. This is a decrease in the number of white blood cells, red blood cells, and platelets. This may raise your risk of infection, make you tired and weak, and raise your risk of bleeding.     Neutropenic fever. A type of fever that can develop when you have a very low number of white blood cells which can be life-threatening.     Soreness of the mouth and throat. You may have red areas, white patches, or sores that hurt.     Nausea and vomiting (throwing up)     Constipation (not able to move bowels)     Diarrhea (loose bowel movements)     Infections     Fluid retention - swelling of the hands, feet, or any other part of the body. Fluid may build-up around your lungs and/or heart.     Changes in the way food and drinks taste     Effects on the nerves are called peripheral neuropathy. You may feel numbness, tingling, or pain in your hands and feet. It may be hard for you to button your clothes, open  jars, or walk as usual. The effect on the nerves may get worse with more doses of the drug. These effects get better in some people after the drug is stopped but it does not get better in all people.     Decreased appetite (decreased hunger)     Weakness     Pain     Muscle pain/aching     Trouble breathing     Changes in your nail color, you may have nail loss and/or brittle nail     Hair loss. Hair loss is often temporary, although there have been cases of permanent hair loss reported. Hair loss may happen suddenly or gradually. If you lose hair, you may lose it from your head, face, armpits, pubic area, chest, and/or legs. You may also notice your hair getting thin.     Allergic skin reaction. You may develop blisters on your skin that are filled with fluid  or a severe red rash all over your body that may be painful.     Allergic reactions, including anaphylaxis are rare but may happen in some patients. Signs of allergic reaction to this drug may be swelling of the face, feeling like your tongue or throat are swelling, trouble breathing, rash, itching, fever, chills, feeling dizzy, and/or feeling that your heart is beating in a fast or not normal way. If this happens, do not take another dose of this drug. You should get urgent medical treatment.    Note: Not all possible side effects are included above.    Warnings and Precautions     Severe bone marrow suppression, including febrile neutropenia which can be life-threatening     Severe allergic reactions, including anaphylaxis which can be life-threatening  Colitis, which is swelling (inflammation) in the colon - symptoms are diarrhea (loose bowel movements), stomach cramping, and sometimes blood in the bowel movements  Severe skin reactions, including redness, swelling, or peeling of skin  Severe swelling in the eye or other changes in eyesight     Severe fluid retention     If you have a history of abnormal liver function, receive high doses of docetaxel, or have a history of lung cancer and have received treatment with a platinum (type of chemotherapy medication), you have an increased risk of death.     Severe weakness     This drug may raise your risk of getting a second cancer such as leukemia, lymphoma, myelodysplastic syndrome, and kidney cancer.     Severe peripheral neuropathy     This drug contains alcohol and may affect your central nervous system. The central nervous system is made up of your brain and spinal cord. You may feel dizzy and very sleepy.     Tumor lysis syndrome: This drug may act on the cancer cells very quickly. This may affect how your kidneys work. Note: Some of the side effects above are very rare. If you have concerns and/or questions, please discuss them with  your medical team. Important Information     This drug may be present in the saliva, tears, sweat, urine, stool, vomit, semen, and vaginal secretions. Talk to your doctor and/or your nurse about the necessary precautions to take during this time.     This drug may impair your ability to drive or use machinery. Use caution and tell your nurse or doctor if you feel dizzy, very sleepy, and/or experience low blood pressure. Treating Side Effects     Manage tiredness by pacing your activities for  the day.     Be sure to include periods of rest between energy-draining activities.     Get regular exercise. If you feel too tired to exercise vigorously, try taking a short walk.      To decrease the risk of infection, wash your hands regularly.     Avoid close contact with people who have a cold, the flu, or other infections.     Take your temperature as your doctor or nurse tells you, and whenever you feel like you may have a fever.     To help decrease the risk of bleeding, use a soft toothbrush. Check with your nurse before using dental floss.     Be very careful when using knives or tools.     Use an electric shaver instead of a razor.     Mouth care is very important and will help food taste better and improve your appetite. Your mouth care should consist of routine, gentle cleaning of your teeth or dentures and rinsing your mouth with a mixture of 1/2 teaspoon of salt in 8 ounces of water or 1/2 teaspoon of baking soda in 8 ounces of water. This should be done at least after each meal and at bedtime.     If you have mouth sores, avoid mouthwash that has alcohol. Also avoid alcohol and smoking because they can bother your mouth and throat.     Taking good care of your mouth may help food taste better and improve your appetite     Drink plenty of fluids (a minimum of eight glasses per day is recommended).     If you throw up or have diarrhea, you should drink more fluids so that you do not  become dehydrated (lack of water in the body from losing too much fluid).     If you have diarrhea, eat low-fiber foods that are high in protein and calories and avoid foods that can irritate your digestive tracts or lead to cramping.     If you are not able to move your bowels, check with your doctor or nurse before you use enemas, laxatives, or suppositories.     Ask your doctor or nurse about medicines that are available to help stop or lessen constipation and/ or diarrhea.     To help with nausea and vomiting, eat small, frequent meals instead of three large meals a day. Choose foods and drinks that are at room temperature. Ask your nurse or doctor about other helpful tips and medicine that is available to help stop or lessen these symptoms.     To help with decreased appetite, eat foods high in calories and protein, such as meat, poultry, fish, dry beans, tofu, eggs, nuts, milk, yogurt, cheese, ice cream, pudding, and nutritional supplements.     Consider using sauces and spices to increase taste. Daily exercise, with your doctor's approval, may increase your appetite.     Keeping your pain under control is important to your well-being. Please tell your doctor or nurse if you are experiencing pain.     If you get a rash do not put anything on it unless your doctor or nurse says you may. Keep the area around the rash clean and dry. Ask your doctor for medicine if your rash bothers you.     Keeping your nails moisturized may help with brittleness.     To help with hair loss, wash with a mild shampoo and avoid washing your hair every day. Avoid  coloring your hair.     Avoid rubbing your scalp, pat your hair or scalp dry.     Limit your use of hair spray, electric curlers, blow dryers, and curling irons.     If you are interested in getting a wig, talk to your nurse and they can help you get in touch with programs in your local area.     If you have numbness and tingling in your hands  and feet, be careful when cooking, walking, and handling sharp objects and hot liquids.    Food and Drug Interactions     This drug may interact with grapefruit and grapefruit juice. Talk to your doctor as this could make side effects worse.     This drug may interact with other medicines. Tell your doctor and pharmacist about all the prescription and over-the-counter medicines and dietary supplements (vitamins, minerals, herbs, and others) that you are taking at this time. Also, check with your doctor or pharmacist before starting any new prescription or over-the-counter medicines, or dietary supplements to make sure that there are no interactions.     This drug may interact with St. John's Wort and may lower the levels of the drug in your body, which can make it less effective. When to Call the Doctor Call your doctor or nurse if you have any of these symptoms and/or any new or unusual symptoms:     Fever of 100.4 F (38 C) or higher     Chills     Blurred vision or other changes in eyesight, excessive tearing     Symptoms of being drunk, confusion, or being very sleepy     Feeling dizzy or lightheaded   Tiredness and/or weakness that interferes with your daily activities  Easy bruising or bleeding     Wheezing and/or trouble breathing     Chest pain     Pain in your mouth or throat that makes it hard to eat or drink     Nausea that stops you from eating or drinking and/or is not relieved by prescribed medicines     Throwing up more than 3 times a day     Lasting loss of appetite or rapid weight loss of five pounds in a week     Diarrhea, 4 times in one day or diarrhea with lack of strength or a feeling of being dizzy     No bowel movement in 3 days or when you feel uncomfortable     Pain in your abdomen that does not go away     Blood in your stool     Swelling of the hands, feet, or any other part of the body     Weight gain of 5 pounds in one week (fluid retention)      New rash and/or itching  Rash that is not relieved by prescribed medicines     Signs of inflammation/infection (redness, swelling, pain) of the tissue around your nails.     Signs of allergic reaction: swelling of the face, feeling like your tongue or throat are swelling, trouble breathing, rash, itching, fever, chills, feeling dizzy, and/or feeling that your heart is beating in a fast or not normal way. If this happens, call 911 for emergency care.     Flu-like symptoms: fever, headache, muscle and joint aches, and fatigue (low energy, feeling weak)     Signs of possible liver problems: dark urine, pale bowel movements, pain in your abdomen, feeling very tired and weak,  unusual itching, or yellowing of the eyes or skin     Numbness, tingling, or pain in your hands and feet     Signs of tumor lysis: confusion or agitation, decreased urine, nausea/vomiting, diarrhea, muscle cramping, numbness and/or tingling, seizures.     General pain that does not go away or is not relieved by prescribed medicine     If you think you may be pregnant or have impregnated your partner    Reproduction Warnings     Pregnancy warning: This drug can have harmful effects on the unborn baby. Women of childbearing potential should use effective methods of birth control during your cancer treatment and for 6 months after stopping treatment. Men with male partners of childbearing potential should use effective methods of birth control during your cancer treatment and for 3 months after stopping treatment. Let your doctor know right away if you think you may be pregnant or may have impregnated your partner.     Breastfeeding warning: Women should not breastfeed during treatment and for 1 week after stopping treatment because this drug could enter the breast milk and cause harm to a breastfeeding baby.     Fertility warning: In men, this drug may affect your ability to have children in the future. Talk with your  doctor or nurse if you plan to have children. Ask for information on sperm banking.    SELF CARE ACTIVITIES WHILE ON CHEMOTHERAPY/IMMUNOTHERAPY:   Hydration Increase your fluid intake 48 hours prior to treatment and drink at least 8 to 12 cups (64 ounces) of water/decaffeinated beverages per day after treatment. You can still have your cup of coffee or soda but these beverages do not count as part of your 8 to 12 cups that you need to drink daily. No alcohol intake.   Medications Continue taking your normal prescription medication as prescribed.  If you start any new herbal or new supplements please let us know first to make sure it is safe.   Mouth Care Have teeth cleaned professionally before starting treatment. Keep dentures and partial plates clean. Use soft toothbrush and do not use mouthwashes that contain alcohol. Biotene is a good mouthwash that is available at most pharmacies or may be ordered by calling [phone]. Use warm salt water gargles (1 teaspoon salt per 1 quart warm water) before and after meals and at bedtime. Or you may rinse with 2 tablespoons of three-percent hydrogen peroxide mixed in eight ounces of water. If you are still having problems with your mouth or sores in your mouth please call the clinic. If you need dental work, please let the doctor know before you go for your appointment so that we can coordinate the best possible time for you in regards to your chemo regimen. You need to also let your dentist know that you are actively taking chemo. We may need to do labs prior to your dental appointment.   Skin Care Always use sunscreen that has not expired and with SPF (Sun Protection Factor) of 50 or higher. Wear hats to protect your head from the sun. Remember to use sunscreen on your hands, ears, face, & feet.  Use good moisturizing lotions such as udder cream, eucerin, or even Vaseline. Some chemotherapies can cause dry skin, color changes in your skin and nails.      Avoid long, hot showers or baths. Use gentle, fragrance-free soaps and laundry detergent. Use moisturizers, preferably creams or ointments rather than lotions because the thicker consistency is better  at preventing skin dehydration. Apply the cream or ointment within 15 minutes of showering. Reapply moisturizer at night, and moisturize your hands every time after you wash them.     Infection Prevention Please wash your hands for at least 30 seconds using warm soapy water. Handwashing is the #1 way to prevent the spread of germs. Stay away from sick people or people who are getting over a cold. If you develop respiratory systems such as green/yellow mucus production or productive cough or persistent cough let us know and we will see if you need an antibiotic. It is a good idea to keep a pair of gloves on when going into grocery stores/[employer] to decrease your risk of coming into contact with germs on the carts, etc. Carry alcohol hand gel with you at all times and use it frequently if out in public. If your temperature reaches 100.5 or higher please call the clinic and let us know.  If it is after hours or on the weekend please go to the ER if your temperature is over 100.4.  Please have your own personal thermometer at home to use.     Sex and bodily fluids If you are going to have sex, a condom must be used to protect the person that isn't taking immunotherapy. For a few days after treatment, immunotherapy can be excreted through your bodily fluids.  When using the toilet please close the lid and flush the toilet twice.  Do this for a few day after you have had immunotherapy.    Contraception It is not known for sure whether or not immunotherapy drugs can be passed on through semen or secretions from the vagina. Because of this some doctors advise people to use a barrier method if you have sex during treatment. This applies to vaginal, anal or oral sex.   Generally, doctors advise a barrier method  only for the time you are actually having the treatment and for about a week after your treatment.   Advice like this can be worrying, but this does not mean that you have to avoid being intimate with your partner. You can still have close contact with your partner and continue to enjoy sex.   Animals If you have cats or birds we just ask that you not change the litter or change the cage.  Please have someone else do this for you while you are on immunotherapy.    Food Safety During and After Cancer Treatment Food safety is important for people both during and after cancer treatment. Cancer and cancer treatments, such as chemotherapy, radiation therapy, and stem cell/bone marrow transplantation, often weaken the immune system. This makes it harder for your body to protect itself from foodborne illness, also called food poisoning. Foodborne illness is caused by eating food that contains harmful bacteria, parasites, or viruses.   Foods to avoid Some foods have a higher risk of becoming tainted with bacteria. These include: Unwashed fresh fruit and vegetables, especially leafy vegetables that can hide dirt and other contaminants Raw sprouts, such as alfalfa sprouts Raw or undercooked beef, especially ground beef, or other raw or undercooked meat and poultry Fatty, fried, or spicy foods immediately before or after treatment.  These can sit heavy on your stomach and make you feel nauseous. Raw or undercooked shellfish, such as oysters. Sushi and sashimi, which often contain raw fish.  Unpasteurized beverages, such as unpasteurized fruit juices, raw milk, raw yogurt, or cider Undercooked eggs, such as soft boiled, over easy,  and poached; raw, unpasteurized eggs; or foods made with raw egg, such as homemade raw cookie dough and homemade mayonnaise   Simple steps for food safety   Shop smart. Do not buy food stored or displayed in an unclean area. Do not buy bruised or damaged fruits or  vegetables. Do not buy cans that have cracks, dents, or bulges. Pick up foods that can spoil at the end of your shopping trip and store them in a cooler on the way home.   Prepare and clean up foods carefully. Rinse all fresh fruits and vegetables under running water, and dry them with a clean towel or paper towel. Clean the top of cans before opening them. After preparing food, wash your hands for 20 seconds with hot water and soap. Pay special attention to areas between fingers and under nails. Clean your utensils and dishes with hot water and soap. Disinfect your kitchen and cutting boards using 1 teaspoon of liquid, unscented bleach mixed into 1 quart of water.     Dispose of old food. Eat canned and packaged food before its expiration date (the "use by" or "best before" date). Consume refrigerated leftovers within 3 to 4 days. After that time, throw out the food. Even if the food does not smell or look spoiled, it still may be unsafe. Some bacteria, such as Listeria, can grow even on foods stored in the refrigerator if they are kept for too long.   Take precautions when eating out. At restaurants, avoid buffets and salad bars where food sits out for a long time and comes in contact with many people. Food can become contaminated when someone with a virus, often a norovirus, or another "bug" handles it. Put any leftover food in a "to-go" container yourself, rather than having the server do it. And, refrigerate leftovers as soon as you get home. Choose restaurants that are clean and that are willing to prepare your food as you order it cooked.       SYMPTOMS TO REPORT AS SOON AS POSSIBLE AFTER TREATMENT:   FEVER GREATER THAN 100.4 F CHILLS WITH OR WITHOUT FEVER NAUSEA AND VOMITING THAT IS NOT CONTROLLED WITH YOUR NAUSEA MEDICATION UNUSUAL SHORTNESS OF BREATH UNUSUAL BRUISING OR BLEEDING TENDERNESS IN MOUTH AND THROAT WITH OR WITHOUT PRESENCE OF ULCERS URINARY PROBLEMS BOWEL  PROBLEMS UNUSUAL RASH       Wear comfortable clothing and clothing appropriate for easy access to any Portacath or PICC line. Let us know if there is anything that we can do to make your therapy better!     What to do if you need assistance after hours or on the weekends: CALL [phone].  HOLD on the line, do not hang up.  You will hear multiple messages but at the end you will be connected with a nurse triage line.  They will contact the doctor if necessary.  Most of the time they will be able to assist you.  Do not call the hospital operator.     I have been informed and understand all of the instructions given to me and have received a copy. I have been instructed to call the clinic [phone] or my family physician as soon as possible for continued medical care, if indicated. I do not have any more questions at this time but understand that I may call the Cancer Center or the Patient Navigator at [phone] during office hours should I have questions or need assistance in obtaining follow-up care.

## 2023-11-26 ENCOUNTER — Inpatient Hospital Stay: Payer: Medicare Other | Admitting: Dietician

## 2023-11-26 ENCOUNTER — Inpatient Hospital Stay: Payer: Medicare Other | Attending: Genetics

## 2023-11-26 ENCOUNTER — Encounter (HOSPITAL_COMMUNITY): Payer: Self-pay | Admitting: Hematology

## 2023-11-26 DIAGNOSIS — F419 Anxiety disorder, unspecified: Secondary | ICD-10-CM | POA: Insufficient documentation

## 2023-11-26 DIAGNOSIS — C61 Malignant neoplasm of prostate: Secondary | ICD-10-CM | POA: Insufficient documentation

## 2023-11-26 DIAGNOSIS — Z7984 Long term (current) use of oral hypoglycemic drugs: Secondary | ICD-10-CM | POA: Insufficient documentation

## 2023-11-26 DIAGNOSIS — Z79899 Other long term (current) drug therapy: Secondary | ICD-10-CM | POA: Insufficient documentation

## 2023-11-26 DIAGNOSIS — C775 Secondary and unspecified malignant neoplasm of intrapelvic lymph nodes: Secondary | ICD-10-CM | POA: Insufficient documentation

## 2023-11-26 DIAGNOSIS — C7951 Secondary malignant neoplasm of bone: Secondary | ICD-10-CM | POA: Insufficient documentation

## 2023-11-26 DIAGNOSIS — J449 Chronic obstructive pulmonary disease, unspecified: Secondary | ICD-10-CM | POA: Insufficient documentation

## 2023-11-26 DIAGNOSIS — I10 Essential (primary) hypertension: Secondary | ICD-10-CM | POA: Insufficient documentation

## 2023-11-26 DIAGNOSIS — F17210 Nicotine dependence, cigarettes, uncomplicated: Secondary | ICD-10-CM | POA: Insufficient documentation

## 2023-11-26 DIAGNOSIS — Z5111 Encounter for antineoplastic chemotherapy: Secondary | ICD-10-CM | POA: Insufficient documentation

## 2023-11-26 DIAGNOSIS — E119 Type 2 diabetes mellitus without complications: Secondary | ICD-10-CM | POA: Insufficient documentation

## 2023-11-26 MED ORDER — LIDOCAINE-PRILOCAINE 2.5-2.5 % EX CREA: TOPICAL_CREAM | CUTANEOUS | 3 refills | Status: DC

## 2023-11-26 MED ORDER — PREDNISONE 5 MG PO TABS: 5.0000 mg | ORAL_TABLET | Freq: Every day | ORAL | 11 refills | Status: DC

## 2023-11-26 MED ORDER — PROCHLORPERAZINE MALEATE 10 MG PO TABS: 10.0000 mg | ORAL_TABLET | Freq: Four times a day (QID) | ORAL | 3 refills | Status: DC | PRN

## 2023-11-26 NOTE — Progress Notes (Signed)
Nutrition Assessment   Reason for Assessment: MD referral   ASSESSMENT: 72 year old male with progression of prostate cancer metastatic to lymph and bone. He is planning to start docetaxel + prednisone q21d. Patient is under the care of Dr. Ellin Saba.   Past medical history includes AAA, COPD, HTN, stroke, DM2, NSCLC s/p XRT  Spoke with patient via telephone. Patient reports chronic fatigue ongoing since lung cancer diagnosis in 2018. Patient states staying tired gets old. Patient reports typically eating twice daily. He recalls a peanut butter sandwich at lunch followed by chicken sandwich for dinner yesterday. Patient says he normally eats a little better. He drinks a lot of water and some coffee. He denies nausea, vomiting, diarrhea, constipation.   Nutrition Focused Physical Exam: deferred (telephone visit)    Medications: xanax, celexa, glipizide, MVI, prednisone, crestor, compazine   Labs: no recent labs for review    Anthropometrics:   Height: 5'10" Weight: 150 lb 5.7 oz (12/3) UBW: 150-155 lb (last 12 mo) BMI: 21.57    NUTRITION DIAGNOSIS: Food and nutrition related knowledge deficit related to prostate cancer as evidenced by no prior need for associated information   INTERVENTION:  Educated on importance of adequate calorie and protein energy intake to preserve LBM, maintain weight/strength during treatment Encouraged smaller more frequent meals/snacks high in calories and protein - will mail handout with ideas Educated on foods with protein, recommend protein source at every meal  Suggested daily Ensure Plus/equivalent - will mail coupons   MONITORING, EVALUATION, GOAL: Patient will tolerate increased calorie and protein energy intake to minimize wt loss during treatment    Next Visit: To be scheduled as needed

## 2023-11-26 NOTE — Progress Notes (Signed)

## 2023-11-28 ENCOUNTER — Encounter (HOSPITAL_COMMUNITY): Payer: Self-pay | Admitting: Hematology

## 2023-11-28 ENCOUNTER — Other Ambulatory Visit: Payer: Self-pay

## 2023-11-29 ENCOUNTER — Other Ambulatory Visit: Payer: Self-pay

## 2023-11-29 ENCOUNTER — Inpatient Hospital Stay: Payer: Medicare Other | Admitting: Licensed Clinical Social Worker

## 2023-11-29 DIAGNOSIS — C61 Malignant neoplasm of prostate: Secondary | ICD-10-CM

## 2023-12-04 ENCOUNTER — Encounter (HOSPITAL_COMMUNITY): Payer: Self-pay | Admitting: Hematology

## 2023-12-04 NOTE — Progress Notes (Signed)
[facility] [address], [place] [address]    Clinic Day:  12/05/2023  Referring physician: Avis Epley, PA*  Patient Care Team: Ladon Applebaum as PCP - General (Family Medicine) Jonelle Sidle, MD as PCP - Cardiology (Cardiology) Franky Macho, MD as Consulting Physician (General Surgery) Doreatha Massed, MD as Medical Oncologist (Medical Oncology) Therese Sarah, RN as Oncology Nurse Navigator (Medical Oncology)   ASSESSMENT & PLAN:   Assessment: 1. 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 - Prostate IMRT from 12/13/2016 through 02/09/2017, 78 Gray in 40 fractions with ADT. - Lupron started back on 09/01/2020 for rising PSA levels. - PSMA PET scan (03/30/2022): Nodes in the retroperitoneum, right pelvis, left lower jugular/supraclavicular stations.  No bone metastasis.  No local recurrence. - Enzalutamide started on 04/03/2022, discontinued due to extreme fatigue, vision changes and jaw pain. - Apalutamide started on 05/01/2022, taking only 2 pills but experiences jaw pain and neck pain and vision changes.  Discontinued on 05/09/2022 due to poor tolerance. - XRT with Dr. Kathrynn Running from 05/30/2022 through 06/30/2022. -Abiraterone 1000 mg and prednisone 5 mg started on 07/06/2022, dose reduced to 750 mg daily, dose further reduced to 500 mg daily on 12/12/2022 due to severe fatigue, discontinued on 11/01/2023 due to progression of disease. - Docetaxel every 3 weeks started on 12/05/2023  2. Social/family history: - Lives at home with his wife.  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 - Current active smoker, 2 packs/day for 55 years.  Rolls his own cigarettes. - Mother had skin cancers.  3.  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 - Definitive SBRT from 10/05/2017 through 10/12/2017,  54 Gray in 3 fractions of 18 Gray.    Plan: Metastatic CRPC to the lymph nodes: - We reviewed the PET scan from 10/25/2023 which showed new diffuse bone metastatic lesions all over the spine and, lateral ribs, scapula.  Mild mediastinal and bilateral hilar adenopathy.  Decreased activity in the mild abdominal retroperitoneal and right external iliac lymphadenopathy. - We reviewed labs today: Normal LFTs and creatinine.  CBC grossly normal.  Last PSA was 2.15. - We discussed starting chemotherapy with docetaxel every 21 days.  We discussed side effects in detail.  We will start him on 80% dose today with G-CSF. - RTC 3 weeks for follow-up.   2.  Osteopenia (DEXA scan on 05/17/2022 T score -2.3): - Denosumab to decrease SRE was previously discussed and he denied it. - Continue calcium and vitamin D supplements.    No orders of the defined types were placed in this encounter.     I,Katie Daubenspeck,acting as a Neurosurgeon for Doreatha Massed, MD.,have documented all relevant documentation on the behalf of Doreatha Massed, MD,as directed by  Doreatha Massed, MD while in the presence of Doreatha Massed, MD.   I, Doreatha Massed MD, have reviewed the above documentation for accuracy and completeness, and I agree with the above.   Doreatha Massed, MD   12/18/20246:36 PM  CHIEF COMPLAINT:   Diagnosis: castration refractory prostate cancer to the lymph nodes    Cancer Staging  Prostate cancer metastatic to intrapelvic lymph node [facility]) Staging form: Prostate, AJCC 7th Edition - Clinical: Stage IV (T1c, N1, M1a, PSA: Less than 10, Gleason 8-10) - Unsigned    Prior Therapy:  Prostate IMRT from 12/13/2016 through 02/09/2017, 78 Gray in  40 fractions with ADT  Enzalutamide started on 04/03/2022, discontinued due to extreme fatigue, vision changes and jaw pain Apalutamide started on 05/01/2022, taking only 2 pills but experiences jaw pain and neck pain and vision changes.   Discontinued on 05/09/2022 due to poor tolerance XRT with Dr. Kathrynn Running from 05/30/2022 through 06/30/2022  Abiraterone 1000 mg and prednisone 5 mg, 07/06/2022 through 11/01/2023  Current Therapy:  Lupron every 6 months- started 09/01/2020    HISTORY OF PRESENT ILLNESS:   Oncology History  Prostate cancer metastatic to intrapelvic lymph node (HCC)  09/29/2016 Initial Diagnosis   Prostate cancer metastatic to intrapelvic lymph node (HCC)    Genetic Testing   Negative genetic testing. No pathogenic variants identified on the Common Hereditary Cancers+RNA panel. The report date is 06/23/2022.  The Common Hereditary Cancers Panel + RNA offered by [facility] includes sequencing and/or deletion duplication testing of the following 47 genes: APC, ATM, AXIN2, BARD1, BMPR1A, BRCA1, BRCA2, BRIP1, CDH1, CDKN2A (p14ARF), CDKN2A (p16INK4a), CKD4, CHEK2, CTNNA1, DICER1, EPCAM (Deletion/duplication testing only), GREM1 (promoter region deletion/duplication testing only), KIT, MEN1, MLH1, MSH2, MSH3, MSH6, MUTYH, NBN, NF1, NHTL1, PALB2, PDGFRA, PMS2, POLD1, POLE, PTEN, RAD50, RAD51C, RAD51D, SDHB, SDHC, SDHD, SMAD4, SMARCA4. STK11, TP53, TSC1, TSC2, and VHL.  The following genes were evaluated for sequence changes only: SDHA and HOXB13 c.251G>A variant only.   12/05/2023 -  Chemotherapy   Patient is on Treatment Plan : PROSTATE Docetaxel (75) + Prednisone q21d        INTERVAL HISTORY:   Stephen Palmer is a 72 y.o. male presenting to clinic today for follow up of castration refractory prostate cancer to the lymph nodes. He was last seen by me on 11/01/23.  Today, he states that he is doing well overall. His appetite level is at 70%. His energy level is at 60%.  PAST MEDICAL HISTORY:   Past Medical History: Past Medical History:  Diagnosis Date   Abdominal aortic aneurysm (AAA) (HCC)    Anxiety    COPD (chronic obstructive pulmonary disease) (HCC)    Coronary artery calcification seen on CT scan    Essential  hypertension    Lung cancer (HCC)    Non small cell carcinoma - XRT   Non-small cell carcinoma of left lung, stage 1 (HCC) 09/21/2017   Prostate cancer (HCC)    XRT   Stroke (HCC)    Type 2 diabetes mellitus (HCC)     Surgical History: Past Surgical History:  Procedure Laterality Date   COLONOSCOPY N/A 11/23/2015   Procedure: COLONOSCOPY;  Surgeon: Franky Macho, MD;  Location: [facility];  Service: Gastroenterology;  Laterality: N/A;   FRACTURE SURGERY     IR IMAGING GUIDED PORT INSERTION  11/20/2023   PROSTATE BIOPSY     SINUS SURGERY WITH INSTATRAK      Social History: Social History   Socioeconomic History   Marital status: Married    Spouse name: Not on file   Number of children: Not on file   Years of education: Not on file   Highest education level: Not on file  Occupational History   Not on file  Tobacco Use   Smoking status: Every Day    Current packs/day: 2.00    Average packs/day: 2.0 packs/day for 44.0 years (88.0 ttl pk-yrs)    Types: Cigarettes   Smokeless tobacco: Never  Vaping Use   Vaping status: Never Used  Substance and Sexual Activity   Alcohol use: No    Alcohol/week: 0.0 standard drinks of alcohol  Drug use: No   Sexual activity: Not on file  Other Topics Concern   Not on file  Social History Narrative   Not on file   Social Drivers of Health   Financial Resource Strain: Not on file  Food Insecurity: No Food Insecurity (05/01/2023)   Hunger Vital Sign    Worried About Running Out of Food in the Last Year: Never true    Ran Out of Food in the Last Year: Never true  Transportation Needs: No Transportation Needs (05/01/2023)   PRAPARE - Administrator, Civil Service (Medical): No    Lack of Transportation (Non-Medical): No  Physical Activity: Not on file  Stress: Not on file  Social Connections: Not on file  Intimate Partner Violence: Not At Risk (05/01/2023)   Humiliation, Afraid, Rape, and Kick questionnaire    Fear of  Current or Ex-Partner: No    Emotionally Abused: No    Physically Abused: No    Sexually Abused: No    Family History: Family History  Problem Relation Age of Onset   Heart disease Mother    Skin cancer Mother    Heart disease Father        before age 71   Skin cancer Brother    Breast cancer Maternal Aunt    Cancer Neg Hx     Current Medications:  Current Outpatient Medications:    alfuzosin (UROXATRAL) 10 MG 24 hr tablet, Take 1 tablet (10 mg total) by mouth daily with breakfast., Disp: 30 tablet, Rfl: 11   alprazolam (XANAX) 2 MG tablet, Take 2 mg by mouth 2 (two) times daily., Disp: , Rfl:    Blood Glucose Monitoring Suppl (ACCU-CHEK GUIDE ME) w/Device KIT, 1 Piece by Does not apply route as directed., Disp: 1 kit, Rfl: 0   citalopram (CELEXA) 40 MG tablet, Take 20-40 mg by mouth daily., Disp: , Rfl:    DULoxetine (CYMBALTA) 30 MG capsule, Take 30 mg by mouth daily., Disp: , Rfl:    glipiZIDE 2.5 MG TABS, Take 2.5 mg by mouth daily before breakfast., Disp: 90 tablet, Rfl: 1   glucose blood (ACCU-CHEK GUIDE) test strip, Use to test glucose 4 times a day., Disp: 200 each, Rfl: 2   ivermectin (STROMECTOL) 3 MG TABS tablet, SMARTSIG:5 Tablet(s) By Mouth Once, Disp: , Rfl:    lidocaine-prilocaine (EMLA) cream, Apply to affected area once, Disp: 30 g, Rfl: 3   multivitamin (ONE-A-DAY MEN'S) TABS tablet, Take 1 tablet by mouth daily., Disp: , Rfl:    predniSONE (DELTASONE) 5 MG tablet, Take 1 tablet (5 mg total) by mouth daily with breakfast., Disp: 30 tablet, Rfl: 11   prochlorperazine (COMPAZINE) 10 MG tablet, Take 1 tablet (10 mg total) by mouth every 6 (six) hours as needed for nausea or vomiting., Disp: 60 tablet, Rfl: 3   rosuvastatin (CRESTOR) 10 MG tablet, TAKE 1 TABLET BY MOUTH DAILY, Disp: 90 tablet, Rfl: 3  Current Facility-Administered Medications:    leuprolide (6 Month) (ELIGARD) injection 45 mg, 45 mg, Subcutaneous, Q6 months, , 45 mg at 09/12/23  1447  Facility-Administered Medications Ordered in Other Visits:    0.9 %  sodium chloride infusion, , Intravenous, Continuous, Doreatha Massed, MD, Stopped at 12/05/23 1310   Allergies: Allergies  Allergen Reactions   Sulfa Antibiotics Hives, Itching and Swelling    Tongue swelling   Sulfasalazine Hives, Itching and Swelling    Tongue swelling   Lipitor [Atorvastatin] Other (See Comments)    myalgia  Simvastatin Other (See Comments)    myalgia   Iodinated Contrast Media Nausea And Vomiting    REVIEW OF SYSTEMS:   Review of Systems  Constitutional:  Negative for chills, fatigue and fever.  HENT:   Negative for lump/mass, mouth sores, nosebleeds, sore throat and trouble swallowing.   Eyes:  Negative for eye problems.  Respiratory:  Positive for cough and shortness of breath.   Cardiovascular:  Negative for chest pain, leg swelling and palpitations.  Gastrointestinal:  Negative for abdominal pain, constipation, diarrhea, nausea and vomiting.  Genitourinary:  Negative for bladder incontinence, difficulty urinating, dysuria, frequency, hematuria and nocturia.   Musculoskeletal:  Negative for arthralgias, back pain, flank pain, myalgias and neck pain.  Skin:  Negative for itching and rash.  Neurological:  Negative for dizziness, headaches and numbness.  Hematological:  Does not bruise/bleed easily.  Psychiatric/Behavioral:  Positive for depression. Negative for sleep disturbance and suicidal ideas. The patient is nervous/anxious.   All other systems reviewed and are negative.    VITALS:   Blood pressure 120/68, pulse 88, temperature 99.9 F (37.7 C), temperature source Tympanic, resp. rate 18, height 5\' 10"  (1.778 m), weight 146 lb 3.2 oz (66.3 kg), SpO2 94%.  Wt Readings from Last 3 Encounters:  12/05/23 146 lb 3.2 oz (66.3 kg)  11/20/23 150 lb 5.7 oz (68.2 kg)  11/01/23 150 lb 6.4 oz (68.2 kg)    Body mass index is 20.98 kg/m.  Performance status (ECOG): 1 -  Symptomatic but completely ambulatory  PHYSICAL EXAM:   Physical Exam Vitals and nursing note reviewed. Exam conducted with a chaperone present.  Constitutional:      Appearance: Normal appearance.  Cardiovascular:     Rate and Rhythm: Normal rate and regular rhythm.     Pulses: Normal pulses.     Heart sounds: Normal heart sounds.  Pulmonary:     Effort: Pulmonary effort is normal.     Breath sounds: Normal breath sounds.  Abdominal:     Palpations: Abdomen is soft. There is no hepatomegaly, splenomegaly or mass.     Tenderness: There is no abdominal tenderness.  Musculoskeletal:     Right lower leg: No edema.     Left lower leg: No edema.  Lymphadenopathy:     Cervical: No cervical adenopathy.     Right cervical: No superficial, deep or posterior cervical adenopathy.    Left cervical: No superficial, deep or posterior cervical adenopathy.     Upper Body:     Right upper body: No supraclavicular or axillary adenopathy.     Left upper body: No supraclavicular or axillary adenopathy.  Neurological:     General: No focal deficit present.     Mental Status: He is alert and oriented to person, place, and time.  Psychiatric:        Mood and Affect: Mood normal.        Behavior: Behavior normal.     LABS:      Latest Ref Rng & Units 12/05/2023    9:21 AM 10/08/2023   10:51 AM 06/12/2023    2:50 PM  CBC  WBC 4.0 - 10.5 K/uL 8.6  10.1  7.7   Hemoglobin 13.0 - 17.0 g/dL 53.6  64.4  03.4   Hematocrit 39.0 - 52.0 % 40.7  46.4  44.4   Platelets 150 - 400 K/uL 191  197  192       Latest Ref Rng & Units 12/05/2023    9:21 AM 10/08/2023  10:51 AM 06/12/2023    2:50 PM  CMP  Glucose 70 - 99 mg/dL 161  096  045   BUN 8 - 23 mg/dL 22  18  21    Creatinine 0.61 - 1.24 mg/dL 4.09  8.11  9.14   Sodium 135 - 145 mmol/L 139  136  137   Potassium 3.5 - 5.1 mmol/L 3.7  3.8  4.3   Chloride 98 - 111 mmol/L 99  100  101   CO2 22 - 32 mmol/L 29  27  27    Calcium 8.9 - 10.3 mg/dL 9.3   9.1  9.2   Total Protein 6.5 - 8.1 g/dL 6.8  7.4  6.9   Total Bilirubin <1.2 mg/dL 0.3  0.4  0.7   Alkaline Phos 38 - 126 U/L 68  70  61   AST 15 - 41 U/L 27  20  18    ALT 0 - 44 U/L 28  20  27       No results found for: "CEA1", "CEA" / No results found for: "CEA1", "CEA" Lab Results  Component Value Date   PSA1 1.1 09/04/2023   No results found for: "NWG956" No results found for: "CAN125"  No results found for: "TOTALPROTELP", "ALBUMINELP", "A1GS", "A2GS", "BETS", "BETA2SER", "GAMS", "MSPIKE", "SPEI" No results found for: "TIBC", "FERRITIN", "IRONPCTSAT" No results found for: "LDH"   STUDIES:   IR IMAGING GUIDED PORT INSERTION Result Date: 11/20/2023 INDICATION: Prostate malignancy EXAM: IMPLANTED PORT A CATH PLACEMENT WITH ULTRASOUND AND FLUOROSCOPIC GUIDANCE MEDICATIONS: None ANESTHESIA/SEDATION: None FLUOROSCOPY: Radiation Exposure Index (as provided by the fluoroscopic device): 1 mGy Kerma COMPLICATIONS: None immediate. PROCEDURE: The procedure, risks, benefits, and alternatives were explained to the patient. Questions regarding the procedure were encouraged and answered. The patient understands and consents to the procedure. A timeout was performed prior to the initiation of the procedure. Patient positioned supine on the angiography table. Right neck and anterior upper chest prepped and draped in the usual sterile fashion. All elements of maximal sterile barrier were utilized including, cap, mask, sterile gown, sterile gloves, large sterile drape, hand scrubbing and 2% Chlorhexidine for skin cleaning. The right internal jugular vein was evaluated with ultrasound and shown to be patent. A permanent ultrasound image was obtained and placed in the patient's medical record. Local anesthesia was provided with 1% lidocaine with epinephrine. Using sterile gel and a sterile probe cover, the right internal jugular vein was entered with a 21 ga needle during real time ultrasound guidance. 0.018  inch guidewire placed and 21 ga needle exchanged for transitional dilator set. Utilizing fluoroscopy, 0.035 inch guidewire advanced centrally without difficulty. Attention then turned to the right anterior upper chest. Following local lidocaine administration, a port pocket was created. The catheter was connected to the port and brought from the pocket to the venotomy site through a subcutaneous tunnel. The catheter was cut to size and inserted through the peel-away sheath. The catheter tip was positioned at the cavoatrial junction using fluoroscopic guidance. The port aspirated and flushed well. The port pocket was closed with deep and superficial absorbable suture. The port pocket incision and venotomy sites were also sealed with Dermabond. IMPRESSION: 1. Right internal jugular approach power injectable Port-A-Cath is ready for use. 2. Catheter tip is at the cavoatrial junction. Electronically Signed   By: Acquanetta Belling M.D.   On: 11/20/2023 12:44

## 2023-12-04 NOTE — Progress Notes (Signed)
[facility] Clinical Social Work  Initial Assessment   Tobias Dircks. is a 72 y.o. year old male contacted by phone. Clinical Social Work was referred by medical provider for assessment of psychosocial needs.   SDOH (Social Determinants of Health) assessments performed: Yes SDOH Interventions    Flowsheet Row Nutrition from 10/06/2020 in [facility] [place] Nutrition from 10/30/2018 in [facility] [place]  SDOH Interventions    Depression Interventions/Treatment  Patient refuses Treatment, Currently on Treatment Currently on Treatment       SDOH Screenings   Food Insecurity: No Food Insecurity (05/01/2023)  Housing: Low Risk  (05/01/2023)  Transportation Needs: No Transportation Needs (05/01/2023)  Utilities: Not At Risk (05/01/2023)  Depression (PHQ2-9): Medium Risk (10/06/2020)  Tobacco Use: High Risk (09/26/2023)   Received from [facility]     Distress Screen completed: Yes    05/01/2023    5:00 PM  ONCBCN DISTRESS SCREENING  Distress experienced in past week (1-10) 5  Emotional problem type Nervousness/Anxiety;Adjusting to illness      Family/Social Information:  Housing Arrangement: patient lives with his spouse.   Family members/support persons in your life? Pt's children reside out of state and are not available to provide support.  Pt reports having a strong faith and receives support from his spiritual community. Transportation concerns: no  Employment: Retired was a Naval architect prior to retirement.  Income source: Actor concerns: No Type of concern: None Food access concerns: no Religious or spiritual practice: Yes-  Services Currently in place:  none  Coping/ Adjustment to diagnosis: Patient understands treatment plan and what happens next? yes Concerns about diagnosis and/or treatment: Quality of life Patient reported  stressors: Adjusting to my illness Hopes and/or priorities: Pt's priority is to start treatment w/ the hope of positive results.  Pt originally diagnosed w/ prostate cancer in 2017 w/ recent findings of metastatic disease. Patient enjoys time with family/ friends Current coping skills/ strengths: Motivation for treatment/growth , Physical Health , and Supportive family/friends     SUMMARY: Current SDOH Barriers:  None identified at this time.  Clinical Social Work Clinical Goal(s):  No clinical social work goals at this time  Interventions: Discussed common feeling and emotions when being diagnosed with cancer, and the importance of support during treatment Informed patient of the support team roles and support services at [facility] Provided CSW contact information and encouraged patient to call with any questions or concerns Referred pt to [employer].    Follow Up Plan: Patient will contact CSW with any support or resource needs Patient verbalizes understanding of plan: Yes    Rachel Moulds, LCSW Clinical Social Worker [facility]

## 2023-12-05 ENCOUNTER — Inpatient Hospital Stay: Payer: Medicare Other | Admitting: Hematology

## 2023-12-05 ENCOUNTER — Inpatient Hospital Stay: Payer: Medicare Other

## 2023-12-05 VITALS — BP 120/68 | HR 88 | Temp 99.9°F | Resp 18 | Ht 70.0 in | Wt 146.2 lb

## 2023-12-05 VITALS — BP 135/76 | HR 63 | Temp 97.0°F | Resp 19

## 2023-12-05 DIAGNOSIS — C775 Secondary and unspecified malignant neoplasm of intrapelvic lymph nodes: Secondary | ICD-10-CM | POA: Diagnosis not present

## 2023-12-05 DIAGNOSIS — E119 Type 2 diabetes mellitus without complications: Secondary | ICD-10-CM | POA: Diagnosis not present

## 2023-12-05 DIAGNOSIS — I10 Essential (primary) hypertension: Secondary | ICD-10-CM | POA: Diagnosis not present

## 2023-12-05 DIAGNOSIS — F17210 Nicotine dependence, cigarettes, uncomplicated: Secondary | ICD-10-CM | POA: Diagnosis not present

## 2023-12-05 DIAGNOSIS — C7951 Secondary malignant neoplasm of bone: Secondary | ICD-10-CM | POA: Diagnosis not present

## 2023-12-05 DIAGNOSIS — Z5111 Encounter for antineoplastic chemotherapy: Secondary | ICD-10-CM | POA: Diagnosis not present

## 2023-12-05 DIAGNOSIS — J449 Chronic obstructive pulmonary disease, unspecified: Secondary | ICD-10-CM | POA: Diagnosis not present

## 2023-12-05 DIAGNOSIS — C61 Malignant neoplasm of prostate: Secondary | ICD-10-CM | POA: Diagnosis not present

## 2023-12-05 DIAGNOSIS — Z79899 Other long term (current) drug therapy: Secondary | ICD-10-CM | POA: Diagnosis not present

## 2023-12-05 DIAGNOSIS — F419 Anxiety disorder, unspecified: Secondary | ICD-10-CM | POA: Diagnosis not present

## 2023-12-05 DIAGNOSIS — Z7984 Long term (current) use of oral hypoglycemic drugs: Secondary | ICD-10-CM | POA: Diagnosis not present

## 2023-12-05 LAB — CBC WITH DIFFERENTIAL/PLATELET
Abs Immature Granulocytes: 0.03 10*3/uL (ref 0.00–0.07)
Basophils Absolute: 0 10*3/uL (ref 0.0–0.1)
Basophils Relative: 1 %
Eosinophils Absolute: 0.1 10*3/uL (ref 0.0–0.5)
Eosinophils Relative: 2 %
HCT: 40.7 % (ref 39.0–52.0)
Hemoglobin: 13 g/dL (ref 13.0–17.0)
Immature Granulocytes: 0 %
Lymphocytes Relative: 9 %
Lymphs Abs: 0.8 10*3/uL (ref 0.7–4.0)
MCH: 31.3 pg (ref 26.0–34.0)
MCHC: 31.9 g/dL (ref 30.0–36.0)
MCV: 97.8 fL (ref 80.0–100.0)
Monocytes Absolute: 0.5 10*3/uL (ref 0.1–1.0)
Monocytes Relative: 6 %
Neutro Abs: 7.1 10*3/uL (ref 1.7–7.7)
Neutrophils Relative %: 82 %
Platelets: 191 10*3/uL (ref 150–400)
RBC: 4.16 MIL/uL — ABNORMAL LOW (ref 4.22–5.81)
RDW: 12.9 % (ref 11.5–15.5)
WBC: 8.6 10*3/uL (ref 4.0–10.5)
nRBC: 0 % (ref 0.0–0.2)

## 2023-12-05 LAB — COMPREHENSIVE METABOLIC PANEL
ALT: 28 U/L (ref 0–44)
AST: 27 U/L (ref 15–41)
Albumin: 3.7 g/dL (ref 3.5–5.0)
Alkaline Phosphatase: 68 U/L (ref 38–126)
Anion gap: 11 (ref 5–15)
BUN: 22 mg/dL (ref 8–23)
CO2: 29 mmol/L (ref 22–32)
Calcium: 9.3 mg/dL (ref 8.9–10.3)
Chloride: 99 mmol/L (ref 98–111)
Creatinine, Ser: 1.13 mg/dL (ref 0.61–1.24)
GFR, Estimated: >60 mL/min (ref >60)
Glucose, Bld: 214 mg/dL — ABNORMAL HIGH (ref 70–99)
Potassium: 3.7 mmol/L (ref 3.5–5.1)
Sodium: 139 mmol/L (ref 135–145)
Total Bilirubin: 0.3 mg/dL (ref <1.2)
Total Protein: 6.8 g/dL (ref 6.5–8.1)

## 2023-12-05 LAB — MAGNESIUM: Magnesium: 2 mg/dL (ref 1.7–2.4)

## 2023-12-05 MED ORDER — DEXAMETHASONE SODIUM PHOSPHATE 10 MG/ML IJ SOLN
10.0000 mg | Freq: Once | INTRAMUSCULAR | Status: AC
Start: 2023-12-05 — End: 2023-12-05
  Administered 2023-12-05: 10 mg via INTRAVENOUS
  Filled 2023-12-05: qty 1

## 2023-12-05 MED ORDER — SODIUM CHLORIDE 0.9 % IV SOLN: 10.0000 mg | Freq: Once | INTRAVENOUS | Status: DC

## 2023-12-05 MED ORDER — HEPARIN SOD (PORK) LOCK FLUSH 100 UNIT/ML IV SOLN
500.0000 [IU] | Freq: Once | INTRAVENOUS | Status: AC | PRN
Start: 2023-12-05 — End: 2023-12-05
  Administered 2023-12-05: 500 [IU]

## 2023-12-05 MED ORDER — PALONOSETRON HCL INJECTION 0.25 MG/5ML
0.2500 mg | Freq: Once | INTRAVENOUS | Status: AC
Start: 2023-12-05 — End: 2023-12-05
  Administered 2023-12-05: 0.25 mg via INTRAVENOUS
  Filled 2023-12-05: qty 5

## 2023-12-05 MED ORDER — SODIUM CHLORIDE 0.9% FLUSH
10.0000 mL | Freq: Once | INTRAVENOUS | Status: AC
  Administered 2023-12-05: 10 mL via INTRAVENOUS

## 2023-12-05 MED ORDER — SODIUM CHLORIDE 0.9 % IV SOLN
60.0000 mg/m2 | Freq: Once | INTRAVENOUS | Status: AC
  Administered 2023-12-05: 110 mg via INTRAVENOUS
  Filled 2023-12-05: qty 11

## 2023-12-05 MED ORDER — SODIUM CHLORIDE 0.9 % IV SOLN: INTRAVENOUS | Status: AC

## 2023-12-05 NOTE — Patient Instructions (Signed)

## 2023-12-05 NOTE — Progress Notes (Signed)
Pharmacist Chemotherapy Monitoring - Initial Assessment    Anticipated start date: 12/05/23   The following has been reviewed per standard work regarding the patient's treatment regimen: The patient's diagnosis, treatment plan and drug doses, and organ/hematologic function Lab orders and baseline tests specific to treatment regimen  The treatment plan start date, drug sequencing, and pre-medications Prior authorization status  Patient's documented medication list, including drug-drug interaction screen and prescriptions for anti-emetics and supportive care specific to the treatment regimen The drug concentrations, fluid compatibility, administration routes, and timing of the medications to be used The patient's access for treatment and lifetime cumulative dose history, if applicable  The patient's medication allergies and previous infusion related reactions, if applicable   Changes made to treatment plan:  N/A  Follow up needed:  N/A   Stephen Palmer, [facility], 12/05/2023  10:25 AM

## 2023-12-05 NOTE — Progress Notes (Unsigned)
Pt.'s glucose 214. Pt refuses to take 10 units of Novolog SQ because he rather take his home prescriptions when he gets home. RN educated pt on the importance of taking home prescriptions prior to appt. Pt and pt.'s wife verbalized understanding. MD and treatment team made aware.  Patient tolerated chemotherapy with no complaints voiced.  Docetaxel titrated per protocol without any complications. Side effects with management reviewed with understanding verbalized.  Port site clean and dry with no bruising or swelling noted at site.  Good blood return noted before and after administration of chemotherapy.  Band aid applied.  Patient left in satisfactory condition with VSS and no s/s of distress noted. All follow ups as scheduled.   Dustin Burrill [employer]

## 2023-12-05 NOTE — Patient Instructions (Signed)
 [facility] [place] [facility]  Discharge Instructions: Thank you for choosing [facility] to provide your oncology and hematology care.  If you have a lab appointment with the Cancer Center - please note that after April 8th, 2024, all labs will be drawn in the cancer center.  You do not have to check in or register with the main entrance as you have in the past but will complete your check-in in the cancer center.  Wear comfortable clothing and clothing appropriate for easy access to any Portacath or PICC line.   We strive to give you quality time with your provider. You may need to reschedule your appointment if you arrive late (15 or more minutes).  Arriving late affects you and other patients whose appointments are after yours.  Also, if you miss three or more appointments without notifying the office, you may be dismissed from the clinic at the provider's discretion.      For prescription refill requests, have your pharmacy contact our office and allow 72 hours for refills to be completed.    Today you received the following chemotherapy and/or immunotherapy agents Taxotere   To help prevent nausea and vomiting after your treatment, we encourage you to take your nausea medication as directed.  BELOW ARE SYMPTOMS THAT SHOULD BE REPORTED IMMEDIATELY: *FEVER GREATER THAN 100.4 F (38 C) OR HIGHER *CHILLS OR SWEATING *NAUSEA AND VOMITING THAT IS NOT CONTROLLED WITH YOUR NAUSEA MEDICATION *UNUSUAL SHORTNESS OF BREATH *UNUSUAL BRUISING OR BLEEDING *URINARY PROBLEMS (pain or burning when urinating, or frequent urination) *BOWEL PROBLEMS (unusual diarrhea, constipation, pain near the anus) TENDERNESS IN MOUTH AND THROAT WITH OR WITHOUT PRESENCE OF ULCERS (sore throat, sores in mouth, or a toothache) UNUSUAL RASH, SWELLING OR PAIN  UNUSUAL VAGINAL DISCHARGE OR ITCHING   Items with * indicate a potential emergency and should be followed up as  soon as possible or go to the Emergency Department if any problems should occur.  Please show the CHEMOTHERAPY ALERT CARD or IMMUNOTHERAPY ALERT CARD at check-in to the Emergency Department and triage nurse.  Should you have questions after your visit or need to cancel or reschedule your appointment, please contact [facility] [place] - A DEPT OF [facility] [phone]  and follow the prompts.  Office hours are 8:00 a.m. to 4:30 p.m. Monday - Friday. Please note that voicemails left after 4:00 p.m. may not be returned until the following business day.  We are closed weekends and major holidays. You have access to a nurse at all times for urgent questions. Please call the main number to the clinic [phone] and follow the prompts.  For any non-urgent questions, you may also contact your provider using MyChart. We now offer e-Visits for anyone 16 and older to request care online for non-urgent symptoms. For details visit [email or web address].   Also download the MyChart app! Go to the app store, search "MyChart", open the app, select [place], and log in with your MyChart username and password.

## 2023-12-06 ENCOUNTER — Inpatient Hospital Stay: Payer: Medicare Other | Admitting: Licensed Clinical Social Worker

## 2023-12-06 DIAGNOSIS — C775 Secondary and unspecified malignant neoplasm of intrapelvic lymph nodes: Secondary | ICD-10-CM

## 2023-12-06 NOTE — Progress Notes (Signed)
[facility] CSW Progress Note  Clinical Child psychotherapist contacted patient by phone to answer questions regarding financial concerns.  CSW verified pt is awarded  [employer] benefits.  Pt instructed to bring in award letter for [facility] benefits as well as proof of income to his next appointment and he will be able to apply for the [employer].  Pt verbalized agreement to bring in required paperwork on 12/20.    Rachel Moulds, LCSW Clinical Social Worker [facility]

## 2023-12-07 ENCOUNTER — Inpatient Hospital Stay: Payer: Medicare Other

## 2023-12-07 VITALS — BP 107/71 | HR 73 | Temp 98.4°F | Resp 20

## 2023-12-07 DIAGNOSIS — C7951 Secondary malignant neoplasm of bone: Secondary | ICD-10-CM | POA: Diagnosis not present

## 2023-12-07 DIAGNOSIS — E119 Type 2 diabetes mellitus without complications: Secondary | ICD-10-CM | POA: Diagnosis not present

## 2023-12-07 DIAGNOSIS — I10 Essential (primary) hypertension: Secondary | ICD-10-CM | POA: Diagnosis not present

## 2023-12-07 DIAGNOSIS — C775 Secondary and unspecified malignant neoplasm of intrapelvic lymph nodes: Secondary | ICD-10-CM | POA: Diagnosis not present

## 2023-12-07 DIAGNOSIS — Z5111 Encounter for antineoplastic chemotherapy: Secondary | ICD-10-CM | POA: Diagnosis not present

## 2023-12-07 DIAGNOSIS — C61 Malignant neoplasm of prostate: Secondary | ICD-10-CM | POA: Diagnosis not present

## 2023-12-07 MED ORDER — PEGFILGRASTIM-FPGK 6 MG/0.6ML ~~LOC~~ SOSY
6.0000 mg | PREFILLED_SYRINGE | Freq: Once | SUBCUTANEOUS | Status: AC
  Administered 2023-12-07: 6 mg via SUBCUTANEOUS
  Filled 2023-12-07: qty 0.6

## 2023-12-07 NOTE — Patient Instructions (Signed)
 [facility] [place] - [facility]  Discharge Instructions: Thank you for choosing [facility] to provide your oncology and hematology care.  If you have a lab appointment with the Cancer Center - please note that after April 8th, 2024, all labs will be drawn in the cancer center.  You do not have to check in or register with the main entrance as you have in the past but will complete your check-in in the cancer center.  Wear comfortable clothing and clothing appropriate for easy access to any Portacath or PICC line.   We strive to give you quality time with your provider. You may need to reschedule your appointment if you arrive late (15 or more minutes).  Arriving late affects you and other patients whose appointments are after yours.  Also, if you miss three or more appointments without notifying the office, you may be dismissed from the clinic at the provider's discretion.      For prescription refill requests, have your pharmacy contact our office and allow 72 hours for refills to be completed.    Today you received the following chemotherapy and/or immunotherapy agents Stimufend      To help prevent nausea and vomiting after your treatment, we encourage you to take your nausea medication as directed.  BELOW ARE SYMPTOMS THAT SHOULD BE REPORTED IMMEDIATELY: *FEVER GREATER THAN 100.4 F (38 C) OR HIGHER *CHILLS OR SWEATING *NAUSEA AND VOMITING THAT IS NOT CONTROLLED WITH YOUR NAUSEA MEDICATION *UNUSUAL SHORTNESS OF BREATH *UNUSUAL BRUISING OR BLEEDING *URINARY PROBLEMS (pain or burning when urinating, or frequent urination) *BOWEL PROBLEMS (unusual diarrhea, constipation, pain near the anus) TENDERNESS IN MOUTH AND THROAT WITH OR WITHOUT PRESENCE OF ULCERS (sore throat, sores in mouth, or a toothache) UNUSUAL RASH, SWELLING OR PAIN  UNUSUAL VAGINAL DISCHARGE OR ITCHING   Items with * indicate a potential emergency and should be followed up  as soon as possible or go to the Emergency Department if any problems should occur.  Please show the CHEMOTHERAPY ALERT CARD or IMMUNOTHERAPY ALERT CARD at check-in to the Emergency Department and triage nurse.  Should you have questions after your visit or need to cancel or reschedule your appointment, please contact [facility] [place] - A DEPT OF [facility] [phone]  and follow the prompts.  Office hours are 8:00 a.m. to 4:30 p.m. Monday - Friday. Please note that voicemails left after 4:00 p.m. may not be returned until the following business day.  We are closed weekends and major holidays. You have access to a nurse at all times for urgent questions. Please call the main number to the clinic [phone] and follow the prompts.  For any non-urgent questions, you may also contact your provider using MyChart. We now offer e-Visits for anyone 43 and older to request care online for non-urgent symptoms. For details visit [email or web address].   Also download the MyChart app! Go to the app store, search "MyChart", open the app, select [place], and log in with your MyChart username and password.

## 2023-12-07 NOTE — Progress Notes (Signed)
Stephen Palmer. presents Stimufend today for injection per the provider's orders.  Stable during administration without incident; injection site WNL; see MAR for injection details.  Patient tolerated procedure well and without incident.  No questions or complaints noted at this time.

## 2023-12-10 ENCOUNTER — Telehealth: Payer: Self-pay | Admitting: Hematology

## 2023-12-10 NOTE — Telephone Encounter (Signed)
Approved /enrolled pt into the [employer]

## 2023-12-13 ENCOUNTER — Inpatient Hospital Stay: Payer: Medicare Other | Attending: Genetics | Admitting: Licensed Clinical Social Worker

## 2023-12-13 DIAGNOSIS — C775 Secondary and unspecified malignant neoplasm of intrapelvic lymph nodes: Secondary | ICD-10-CM

## 2023-12-13 DIAGNOSIS — C61 Malignant neoplasm of prostate: Secondary | ICD-10-CM

## 2023-12-13 NOTE — Progress Notes (Signed)
[facility] CSW Progress Note  Clinical Child psychotherapist  returned pt's call.  Pt calling to verify he has been approved for the [employer].  Per chart review pt was approved on 12/23 which was relayed to pt.  Pt informed he can bring in home bills or request a gas card at his next appointment.  Pt also confirmed he has spoken w/ Marijean Niemann and registered for support w/ them.  CSW to continue to provide support as appropriate throughout duration of treatment.         Rachel Moulds, LCSW Clinical Social Worker [facility]

## 2023-12-17 ENCOUNTER — Other Ambulatory Visit: Payer: Medicare Other

## 2023-12-17 DIAGNOSIS — C61 Malignant neoplasm of prostate: Secondary | ICD-10-CM

## 2023-12-17 DIAGNOSIS — C775 Secondary and unspecified malignant neoplasm of intrapelvic lymph nodes: Secondary | ICD-10-CM | POA: Diagnosis not present

## 2023-12-18 LAB — PSA: Prostate Specific Ag, Serum: 4 ng/mL (ref 0.0–4.0)

## 2023-12-25 ENCOUNTER — Telehealth: Payer: Self-pay | Admitting: Hematology

## 2023-12-26 ENCOUNTER — Inpatient Hospital Stay: Payer: Medicare Other

## 2023-12-26 ENCOUNTER — Encounter (HOSPITAL_COMMUNITY): Payer: Self-pay | Admitting: Hematology

## 2023-12-26 ENCOUNTER — Ambulatory Visit (HOSPITAL_COMMUNITY)
Admission: RE | Admit: 2023-12-26 | Discharge: 2023-12-26 | Disposition: A | Discharge status: Home / Self Care | Payer: Medicare Other | Source: Ambulatory Visit | Attending: Hematology | Admitting: Hematology

## 2023-12-26 ENCOUNTER — Inpatient Hospital Stay: Payer: Medicare Other | Attending: Genetics | Admitting: Hematology

## 2023-12-26 VITALS — Wt 141.6 lb

## 2023-12-26 DIAGNOSIS — R0602 Shortness of breath: Secondary | ICD-10-CM | POA: Insufficient documentation

## 2023-12-26 DIAGNOSIS — R058 Other specified cough: Secondary | ICD-10-CM | POA: Diagnosis not present

## 2023-12-26 DIAGNOSIS — Z452 Encounter for adjustment and management of vascular access device: Secondary | ICD-10-CM | POA: Diagnosis not present

## 2023-12-26 DIAGNOSIS — M8580 Other specified disorders of bone density and structure, unspecified site: Secondary | ICD-10-CM | POA: Diagnosis not present

## 2023-12-26 DIAGNOSIS — R918 Other nonspecific abnormal finding of lung field: Secondary | ICD-10-CM | POA: Diagnosis not present

## 2023-12-26 DIAGNOSIS — Z79899 Other long term (current) drug therapy: Secondary | ICD-10-CM | POA: Insufficient documentation

## 2023-12-26 DIAGNOSIS — R051 Acute cough: Secondary | ICD-10-CM

## 2023-12-26 DIAGNOSIS — Z85118 Personal history of other malignant neoplasm of bronchus and lung: Secondary | ICD-10-CM | POA: Insufficient documentation

## 2023-12-26 DIAGNOSIS — Z923 Personal history of irradiation: Secondary | ICD-10-CM | POA: Diagnosis not present

## 2023-12-26 DIAGNOSIS — C775 Secondary and unspecified malignant neoplasm of intrapelvic lymph nodes: Secondary | ICD-10-CM | POA: Diagnosis not present

## 2023-12-26 DIAGNOSIS — F17210 Nicotine dependence, cigarettes, uncomplicated: Secondary | ICD-10-CM | POA: Insufficient documentation

## 2023-12-26 DIAGNOSIS — J439 Emphysema, unspecified: Secondary | ICD-10-CM | POA: Diagnosis not present

## 2023-12-26 DIAGNOSIS — C61 Malignant neoplasm of prostate: Secondary | ICD-10-CM | POA: Insufficient documentation

## 2023-12-26 LAB — COMPREHENSIVE METABOLIC PANEL
ALT: 77 U/L — ABNORMAL HIGH (ref 0–44)
AST: 49 U/L — ABNORMAL HIGH (ref 15–41)
Albumin: 3.1 g/dL — ABNORMAL LOW (ref 3.5–5.0)
Alkaline Phosphatase: 69 U/L (ref 38–126)
Anion gap: 9 (ref 5–15)
BUN: 18 mg/dL (ref 8–23)
CO2: 29 mmol/L (ref 22–32)
Calcium: 8.9 mg/dL (ref 8.9–10.3)
Chloride: 95 mmol/L — ABNORMAL LOW (ref 98–111)
Creatinine, Ser: 0.94 mg/dL (ref 0.61–1.24)
GFR, Estimated: >60 mL/min (ref >60)
Glucose, Bld: 237 mg/dL — ABNORMAL HIGH (ref 70–99)
Potassium: 3.4 mmol/L — ABNORMAL LOW (ref 3.5–5.1)
Sodium: 133 mmol/L — ABNORMAL LOW (ref 135–145)
Total Bilirubin: 0.4 mg/dL (ref 0.0–1.2)
Total Protein: 6.9 g/dL (ref 6.5–8.1)

## 2023-12-26 LAB — CBC WITH DIFFERENTIAL/PLATELET
Abs Immature Granulocytes: 0.1 10*3/uL — ABNORMAL HIGH (ref 0.00–0.07)
Basophils Absolute: 0.1 10*3/uL (ref 0.0–0.1)
Basophils Relative: 0 %
Eosinophils Absolute: 0 10*3/uL (ref 0.0–0.5)
Eosinophils Relative: 0 %
HCT: 35.1 % — ABNORMAL LOW (ref 39.0–52.0)
Hemoglobin: 11.6 g/dL — ABNORMAL LOW (ref 13.0–17.0)
Immature Granulocytes: 1 %
Lymphocytes Relative: 3 %
Lymphs Abs: 0.5 10*3/uL — ABNORMAL LOW (ref 0.7–4.0)
MCH: 32 pg (ref 26.0–34.0)
MCHC: 33 g/dL (ref 30.0–36.0)
MCV: 96.7 fL (ref 80.0–100.0)
Monocytes Absolute: 1.1 10*3/uL — ABNORMAL HIGH (ref 0.1–1.0)
Monocytes Relative: 8 %
Neutro Abs: 12.7 10*3/uL — ABNORMAL HIGH (ref 1.7–7.7)
Neutrophils Relative %: 88 %
Platelets: 297 10*3/uL (ref 150–400)
RBC: 3.63 MIL/uL — ABNORMAL LOW (ref 4.22–5.81)
RDW: 13.4 % (ref 11.5–15.5)
WBC: 14.5 10*3/uL — ABNORMAL HIGH (ref 4.0–10.5)
nRBC: 0 % (ref 0.0–0.2)

## 2023-12-26 LAB — MAGNESIUM: Magnesium: 2.1 mg/dL (ref 1.7–2.4)

## 2023-12-26 MED ORDER — AMOXICILLIN-POT CLAVULANATE 875-125 MG PO TABS: 1.0000 | ORAL_TABLET | Freq: Two times a day (BID) | ORAL | 0 refills | Status: DC

## 2023-12-26 MED ORDER — SODIUM CHLORIDE 0.9% FLUSH
10.0000 mL | INTRAVENOUS | Status: DC | PRN
  Administered 2023-12-26: 10 mL via INTRAVENOUS

## 2023-12-26 MED ORDER — MEGESTROL ACETATE 400 MG/10ML PO SUSP: 400.0000 mg | Freq: Two times a day (BID) | ORAL | 1 refills | Status: DC

## 2023-12-26 MED ORDER — HEPARIN SOD (PORK) LOCK FLUSH 100 UNIT/ML IV SOLN
500.0000 [IU] | Freq: Once | INTRAVENOUS | Status: AC
  Administered 2023-12-26: 500 [IU] via INTRAVENOUS

## 2023-12-26 MED ORDER — AZITHROMYCIN 250 MG PO TABS: ORAL_TABLET | ORAL | 0 refills | Status: DC

## 2023-12-26 NOTE — Patient Instructions (Addendum)
 [facility] at [facility] Discharge Instructions   You were seen and examined today by Dr. Rogers.  He reviewed the results of your lab work which are mostly normal/stable. Your blood sugar is 237 and your liver enzymes are slightly elevated.   We will hold treatment today. Dr. MARLA would like to get a chest x-ray today. We will prescribe 2 different antibiotics to help treat pneumonia.  We will see you back in 2 weeks.  Return as scheduled.     Thank you for choosing [facility] at [facility] to provide your oncology and hematology care.  To afford each patient quality time with our provider, please arrive at least 15 minutes before your scheduled appointment time.   If you have a lab appointment with the Cancer Center please come in thru the Main Entrance and check in at the main information desk.  You need to re-schedule your appointment should you arrive 10 or more minutes late.  We strive to give you quality time with our providers, and arriving late affects you and other patients whose appointments are after yours.  Also, if you no show three or more times for appointments you may be dismissed from the clinic at the providers discretion.     Again, thank you for choosing [facility].  Our hope is that these requests will decrease the amount of time that you wait before being seen by our physicians.       _____________________________________________________________  Should you have questions after your visit to [facility], please contact our office at [phone] and follow the prompts.  Our office hours are 8:00 a.m. and 4:30 p.m. Monday - Friday.  Please note that voicemails left after 4:00 p.m. may not be returned until the following business day.  We are closed weekends and major holidays.  You do have access to a nurse 24-7, just call the main number to the clinic [phone] and do not press any options,  hold on the line and a nurse will answer the phone.    For prescription refill requests, have your pharmacy contact our office and allow 72 hours.    Due to Covid, you will need to wear a mask upon entering the hospital. If you do not have a mask, a mask will be given to you at the Main Entrance upon arrival. For doctor visits, patients may have 1 support person age 98 or older with them. For treatment visits, patients can not have anyone with them due to social distancing guidelines and our immunocompromised population.

## 2023-12-26 NOTE — Addendum Note (Signed)
 Addended by: Harrel Lemon on: 12/26/2023 01:57 PM   Modules accepted: Orders

## 2023-12-26 NOTE — Progress Notes (Signed)
 [facility] [address] [place] [address]    Clinic Day:  12/26/2023  Referring physician: Leonce Lucie PARAS, PA*  Patient Care Team: Leonce Lucie PARAS DEVONNA as PCP - General (Family Medicine) Debera Jayson MATSU, MD as PCP - Cardiology (Cardiology) Mavis Anes, MD as Consulting Physician (General Surgery) Rogers Hai, MD as Medical Oncologist (Medical Oncology) Celestia Joesph SQUIBB, RN as Oncology Nurse Navigator (Medical Oncology)   ASSESSMENT & PLAN:   Assessment: 1. 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 - Prostate IMRT from 12/13/2016 through 02/09/2017, 78 Gray in 40 fractions with ADT. - Lupron  started back on 09/01/2020 for rising PSA levels. - PSMA PET scan (03/30/2022): Nodes in the retroperitoneum, right pelvis, left lower jugular/supraclavicular stations.  No bone metastasis.  No local recurrence. - Enzalutamide  started on 04/03/2022, discontinued due to extreme fatigue, vision changes and jaw pain. - Apalutamide  started on 05/01/2022, taking only 2 pills but experiences jaw pain and neck pain and vision changes.  Discontinued on 05/09/2022 due to poor tolerance. - XRT with Dr. Patrcia from 05/30/2022 through 06/30/2022. -Abiraterone  1000 mg and prednisone  5 mg started on 07/06/2022, dose reduced to 750 mg daily, dose further reduced to 500 mg daily on 12/12/2022 due to severe fatigue, discontinued on 11/01/2023 due to progression of disease. - Docetaxel  every 3 weeks started on 12/05/2023  2. Social/family history: - Lives at home with his wife.  He worked as a naval architect prior to retirement. - Current active smoker, 2 packs/day for 55 years.  Rolls his own cigarettes. - Mother had skin cancers.  3.  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 - Definitive SBRT from 10/05/2017 through 10/12/2017, 54  Gray in 3 fractions of 18 Gray.    Plan: Metastatic CRPC to the lymph nodes: - Cycle 1 of dose reduced docetaxel  on 12/05/2023. - He denied any GI symptoms.  Labs today: White count 14.5 with elevated neutrophils.  LFTs show mildly elevated AST and ALT. - However he developed coughing spells with expectoration for the past 1 week.  Initially his wife developed an infection which later spread to him.  He also reports sore throat.  Erythema in the back of the throat on exam. - After first cycle, he reported decreasing burning sensation in the upper thighs. - He has not been eating much because of coughing episodes and lost about 5 pounds. - Will hold his chemotherapy today.  Will start him on Megace  400 mg twice daily. - Will do a chest x-ray.  Will treat him with Augmentin  and Z-Pak. - RTC 2 weeks for follow-up.   2.  Osteopenia (DEXA scan on 05/17/2022 T score -2.3): - Denosumab to decrease SRE was previously discussed and he denied it. - Continue calcium  and vitamin D  supplements.    Orders Placed This Encounter  Procedures   DG Chest 2 View    Standing Status:   Future    Number of Occurrences:   1    Expected Date:   12/26/2023    Expiration Date:   12/25/2024    Reason for Exam (SYMPTOM  OR DIAGNOSIS REQUIRED):   productive cough, shortness of breath    Preferred imaging location?:   [facility],acting as a scribe for Hai Rogers, MD.,have documented all relevant documentation on the behalf of Vincent Ehrler, MD,as  directed by  Alean Stands, MD while in the presence of Alean Stands, MD.  I, Alean Stands MD, have reviewed the above documentation for accuracy and completeness, and I agree with the above.    Alean Stands, MD   1/8/20252:12 PM  CHIEF COMPLAINT:   Diagnosis: castration refractory prostate cancer to the lymph nodes    Cancer Staging  Prostate cancer metastatic to intrapelvic lymph node  [facility]) Staging form: Prostate, AJCC 7th Edition - Clinical: Stage IV (T1c, N1, M1a, PSA: Less than 10, Gleason 8-10) - Unsigned    Prior Therapy:  Prostate IMRT from 12/13/2016 through 02/09/2017, 78 Gray in 40 fractions with ADT  Enzalutamide  started on 04/03/2022, discontinued due to extreme fatigue, vision changes and jaw pain Apalutamide  started on 05/01/2022, taking only 2 pills but experiences jaw pain and neck pain and vision changes.  Discontinued on 05/09/2022 due to poor tolerance XRT with Dr. Patrcia from 05/30/2022 through 06/30/2022  Abiraterone  1000 mg and prednisone  5 mg, 07/06/2022 through 11/01/2023  Current Therapy:  Lupron  every 6 months- started 09/01/2020    HISTORY OF PRESENT ILLNESS:   Oncology History  Prostate cancer metastatic to intrapelvic lymph node (HCC)  09/29/2016 Initial Diagnosis   Prostate cancer metastatic to intrapelvic lymph node (HCC)    Genetic Testing   Negative genetic testing. No pathogenic variants identified on the Common Hereditary Cancers+RNA panel. The report date is 06/23/2022.  The Common Hereditary Cancers Panel + RNA offered by [facility] includes sequencing and/or deletion duplication testing of the following 47 genes: APC, ATM, AXIN2, BARD1, BMPR1A, BRCA1, BRCA2, BRIP1, CDH1, CDKN2A (p14ARF), CDKN2A (p16INK4a), CKD4, CHEK2, CTNNA1, DICER1, EPCAM (Deletion/duplication testing only), GREM1 (promoter region deletion/duplication testing only), KIT, MEN1, MLH1, MSH2, MSH3, MSH6, MUTYH, NBN, NF1, NHTL1, PALB2, PDGFRA, PMS2, POLD1, POLE, PTEN, RAD50, RAD51C, RAD51D, SDHB, SDHC, SDHD, SMAD4, SMARCA4. STK11, TP53, TSC1, TSC2, and VHL.  The following genes were evaluated for sequence changes only: SDHA and HOXB13 c.251G>A variant only.   12/05/2023 -  Chemotherapy   Patient is on Treatment Plan : PROSTATE Docetaxel  (75) + Prednisone  q21d        INTERVAL HISTORY:   Stephen Palmer is a 73 y.o. male presenting to clinic today for follow up of castration  refractory prostate cancer to the lymph nodes. He was last seen by me on 12/05/23.  Today, he states that he is doing well overall. His appetite level is at 10%. His energy level is at 10%.  PAST MEDICAL HISTORY:   Past Medical History: Past Medical History:  Diagnosis Date   Abdominal aortic aneurysm (AAA) (HCC)    Anxiety    COPD (chronic obstructive pulmonary disease) (HCC)    Coronary artery calcification seen on CT scan    Essential hypertension    Lung cancer (HCC)    Non small cell carcinoma - XRT   Non-small cell carcinoma of left lung, stage 1 (HCC) 09/21/2017   Prostate cancer (HCC)    XRT   Stroke (HCC)    Type 2 diabetes mellitus (HCC)     Surgical History: Past Surgical History:  Procedure Laterality Date   COLONOSCOPY N/A 11/23/2015   Procedure: COLONOSCOPY;  Surgeon: Oneil Budge, MD;  Location: [facility];  Service: Gastroenterology;  Laterality: N/A;   FRACTURE SURGERY     IR IMAGING GUIDED PORT INSERTION  11/20/2023   PROSTATE BIOPSY     SINUS SURGERY WITH INSTATRAK      Social History: Social History   Socioeconomic History   Marital  status: Married    Spouse name: Not on file   Number of children: Not on file   Years of education: Not on file   Highest education level: Not on file  Occupational History   Not on file  Tobacco Use   Smoking status: Every Day    Current packs/day: 2.00    Average packs/day: 2.0 packs/day for 44.0 years (88.0 ttl pk-yrs)    Types: Cigarettes   Smokeless tobacco: Never  Vaping Use   Vaping status: Never Used  Substance and Sexual Activity   Alcohol use: No    Alcohol/week: 0.0 standard drinks of alcohol   Drug use: No   Sexual activity: Not on file  Other Topics Concern   Not on file  Social History Narrative   Not on file   Social Drivers of Health   Financial Resource Strain: Not on file  Food Insecurity: No Food Insecurity (05/01/2023)   Hunger Vital Sign    Worried About Running Out of Food in the  Last Year: Never true    Ran Out of Food in the Last Year: Never true  Transportation Needs: No Transportation Needs (05/01/2023)   PRAPARE - Administrator, Civil Service (Medical): No    Lack of Transportation (Non-Medical): No  Physical Activity: Not on file  Stress: Not on file  Social Connections: Not on file  Intimate Partner Violence: Not At Risk (05/01/2023)   Humiliation, Afraid, Rape, and Kick questionnaire    Fear of Current or Ex-Partner: No    Emotionally Abused: No    Physically Abused: No    Sexually Abused: No    Family History: Family History  Problem Relation Age of Onset   Heart disease Mother    Skin cancer Mother    Heart disease Father        before age 15   Skin cancer Brother    Breast cancer Maternal Aunt    Cancer Neg Hx     Current Medications:  Current Outpatient Medications:    alfuzosin  (UROXATRAL ) 10 MG 24 hr tablet, Take 1 tablet (10 mg total) by mouth daily with breakfast., Disp: 30 tablet, Rfl: 11   alprazolam  (XANAX ) 2 MG tablet, Take 2 mg by mouth 2 (two) times daily., Disp: , Rfl:    amoxicillin -clavulanate (AUGMENTIN ) 875-125 MG tablet, Take 1 tablet by mouth 2 (two) times daily., Disp: 14 tablet, Rfl: 0   azithromycin  (ZITHROMAX  Z-PAK) 250 MG tablet, Take 2 tablets by mouth on the first day and take 1 tablet by mouth daily until complete, Disp: 6 each, Rfl: 0   Blood Glucose Monitoring Suppl (ACCU-CHEK GUIDE ME) w/Device KIT, 1 Piece by Does not apply route as directed., Disp: 1 kit, Rfl: 0   citalopram  (CELEXA ) 40 MG tablet, Take 20-40 mg by mouth daily., Disp: , Rfl:    glipiZIDE  2.5 MG TABS, Take 2.5 mg by mouth daily before breakfast., Disp: 90 tablet, Rfl: 1   glucose blood (ACCU-CHEK GUIDE) test strip, Use to test glucose 4 times a day., Disp: 200 each, Rfl: 2   ivermectin (STROMECTOL) 3 MG TABS tablet, SMARTSIG:5 Tablet(s) By Mouth Once, Disp: , Rfl:    lidocaine -prilocaine  (EMLA ) cream, Apply to affected area once, Disp:  30 g, Rfl: 3   megestrol  (MEGACE ) 400 MG/10ML suspension, Take 10 mLs (400 mg total) by mouth 2 (two) times daily., Disp: 480 mL, Rfl: 1   multivitamin (ONE-A-DAY MEN'S) TABS tablet, Take 1 tablet by mouth daily., Disp: ,  Rfl:    predniSONE  (DELTASONE ) 5 MG tablet, Take 1 tablet (5 mg total) by mouth daily with breakfast., Disp: 30 tablet, Rfl: 11   prochlorperazine  (COMPAZINE ) 10 MG tablet, Take 1 tablet (10 mg total) by mouth every 6 (six) hours as needed for nausea or vomiting., Disp: 60 tablet, Rfl: 3   rosuvastatin  (CRESTOR ) 10 MG tablet, TAKE 1 TABLET BY MOUTH DAILY, Disp: 90 tablet, Rfl: 3  Current Facility-Administered Medications:    leuprolide  (6 Month) (ELIGARD ) injection 45 mg, 45 mg, Subcutaneous, Q6 months, , 45 mg at 09/12/23 1447  Facility-Administered Medications Ordered in Other Visits:    0.9 %  sodium chloride  infusion, , Intravenous, Continuous, Rogers Hai, MD, Stopped at 12/05/23 1310   Allergies: Allergies  Allergen Reactions   Sulfa Antibiotics Hives, Itching and Swelling    Tongue swelling   Sulfasalazine Hives, Itching and Swelling    Tongue swelling   Lipitor [Atorvastatin] Other (See Comments)    myalgia   Simvastatin Other (See Comments)    myalgia   Iodinated Contrast Media Nausea And Vomiting    REVIEW OF SYSTEMS:   Review of Systems  Constitutional:  Negative for chills, fatigue and fever.  HENT:   Positive for trouble swallowing. Negative for lump/mass, mouth sores, nosebleeds and sore throat.   Eyes:  Negative for eye problems.  Respiratory:  Positive for cough and wheezing. Negative for shortness of breath.   Cardiovascular:  Negative for chest pain, leg swelling and palpitations.  Gastrointestinal:  Positive for constipation. Negative for abdominal pain, diarrhea, nausea and vomiting.  Genitourinary:  Negative for bladder incontinence, difficulty urinating, dysuria, frequency, hematuria and nocturia.   Musculoskeletal:  Positive for  back pain. Negative for arthralgias, flank pain, myalgias and neck pain.  Skin:  Negative for itching and rash.  Neurological:  Negative for dizziness, headaches and numbness.  Hematological:  Does not bruise/bleed easily.  Psychiatric/Behavioral:  Negative for depression, sleep disturbance and suicidal ideas. The patient is not nervous/anxious.   All other systems reviewed and are negative.    VITALS:   Weight 141 lb 9.6 oz (64.2 kg).  Wt Readings from Last 3 Encounters:  12/26/23 141 lb 9.6 oz (64.2 kg)  12/05/23 146 lb 3.2 oz (66.3 kg)  11/20/23 150 lb 5.7 oz (68.2 kg)    Body mass index is 20.32 kg/m.  Performance status (ECOG): 1 - Symptomatic but completely ambulatory  PHYSICAL EXAM:   Physical Exam Vitals and nursing note reviewed. Exam conducted with a chaperone present.  Constitutional:      Appearance: Normal appearance.  Cardiovascular:     Rate and Rhythm: Normal rate and regular rhythm.     Pulses: Normal pulses.     Heart sounds: Normal heart sounds.  Pulmonary:     Effort: Pulmonary effort is normal.     Breath sounds: Normal breath sounds.  Abdominal:     Palpations: Abdomen is soft. There is no hepatomegaly, splenomegaly or mass.     Tenderness: There is no abdominal tenderness.  Musculoskeletal:     Right lower leg: No edema.     Left lower leg: No edema.  Lymphadenopathy:     Cervical: No cervical adenopathy.     Right cervical: No superficial, deep or posterior cervical adenopathy.    Left cervical: No superficial, deep or posterior cervical adenopathy.     Upper Body:     Right upper body: No supraclavicular or axillary adenopathy.     Left upper body: No supraclavicular  or axillary adenopathy.  Neurological:     General: No focal deficit present.     Mental Status: He is alert and oriented to person, place, and time.  Psychiatric:        Mood and Affect: Mood normal.        Behavior: Behavior normal.     LABS:      Latest Ref Rng &  Units 12/26/2023   11:38 AM 12/05/2023    9:21 AM 10/08/2023   10:51 AM  CBC  WBC 4.0 - 10.5 K/uL 14.5  8.6  10.1   Hemoglobin 13.0 - 17.0 g/dL 88.3  86.9  84.9   Hematocrit 39.0 - 52.0 % 35.1  40.7  46.4   Platelets 150 - 400 K/uL 297  191  197       Latest Ref Rng & Units 12/26/2023   11:38 AM 12/05/2023    9:21 AM 10/08/2023   10:51 AM  CMP  Glucose 70 - 99 mg/dL 762  785  852   BUN 8 - 23 mg/dL 18  22  18    Creatinine 0.61 - 1.24 mg/dL 9.05  8.86  8.82   Sodium 135 - 145 mmol/L 133  139  136   Potassium 3.5 - 5.1 mmol/L 3.4  3.7  3.8   Chloride 98 - 111 mmol/L 95  99  100   CO2 22 - 32 mmol/L 29  29  27    Calcium  8.9 - 10.3 mg/dL 8.9  9.3  9.1   Total Protein 6.5 - 8.1 g/dL 6.9  6.8  7.4   Total Bilirubin 0.0 - 1.2 mg/dL 0.4  0.3  0.4   Alkaline Phos 38 - 126 U/L 69  68  70   AST 15 - 41 U/L 49  27  20   ALT 0 - 44 U/L 77  28  20      No results found for: CEA1, CEA / No results found for: CEA1, CEA Lab Results  Component Value Date   PSA1 4.0 12/17/2023   No results found for: CAN199 No results found for: CAN125  No results found for: TOTALPROTELP, ALBUMINELP, A1GS, A2GS, BETS, BETA2SER, GAMS, MSPIKE, SPEI No results found for: TIBC, FERRITIN, IRONPCTSAT No results found for: LDH   STUDIES:   No results found.

## 2023-12-26 NOTE — Progress Notes (Signed)
 Message received from A. Dareen Piano RN / Dr. Ellin Saba treatment held today.

## 2023-12-28 ENCOUNTER — Ambulatory Visit: Payer: Medicare Other | Admitting: Urology

## 2023-12-28 ENCOUNTER — Inpatient Hospital Stay: Payer: Medicare Other

## 2024-01-08 NOTE — Progress Notes (Incomplete)
[facility] [address], [place] [address]    Clinic Day:  01/08/2024  Referring physician: Avis Epley, PA*  Patient Care Team: Ladon Applebaum as PCP - General (Family Medicine) Jonelle Sidle, MD as PCP - Cardiology (Cardiology) Franky Macho, MD as Consulting Physician (General Surgery) Doreatha Massed, MD as Medical Oncologist (Medical Oncology) Therese Sarah, RN as Oncology Nurse Navigator (Medical Oncology)   ASSESSMENT & PLAN:   Assessment: 1. 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 - Prostate IMRT from 12/13/2016 through 02/09/2017, 78 Gray in 40 fractions with ADT. - Lupron started back on 09/01/2020 for rising PSA levels. - PSMA PET scan (03/30/2022): Nodes in the retroperitoneum, right pelvis, left lower jugular/supraclavicular stations.  No bone metastasis.  No local recurrence. - Enzalutamide started on 04/03/2022, discontinued due to extreme fatigue, vision changes and jaw pain. - Apalutamide started on 05/01/2022, taking only 2 pills but experiences jaw pain and neck pain and vision changes.  Discontinued on 05/09/2022 due to poor tolerance. - XRT with Dr. Kathrynn Running from 05/30/2022 through 06/30/2022. -Abiraterone 1000 mg and prednisone 5 mg started on 07/06/2022, dose reduced to 750 mg daily, dose further reduced to 500 mg daily on 12/12/2022 due to severe fatigue, discontinued on 11/01/2023 due to progression of disease. - Docetaxel every 3 weeks started on 12/05/2023  2. Social/family history: - Lives at home with his wife.  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 - Current active smoker, 2 packs/day for 55 years.  Rolls his own cigarettes. - Mother had skin cancers.  3.  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 - Definitive SBRT from 10/05/2017 through 10/12/2017,  54 Gray in 3 fractions of 18 Gray.    Plan: Metastatic CRPC to the lymph nodes: - Cycle 1 of dose reduced docetaxel on 12/05/2023. - He denied any GI symptoms.  Labs today: White count 14.5 with elevated neutrophils.  LFTs show mildly elevated AST and ALT. - However he developed coughing spells with expectoration for the past 1 week.  Initially his wife developed an infection which later spread to him.  He also reports sore throat.  Erythema in the back of the throat on exam. - After first cycle, he reported decreasing burning sensation in the upper thighs. - He has not been eating much because of coughing episodes and lost about 5 pounds. - Will hold his chemotherapy today.  Will start him on Megace 400 mg twice daily. - Will do a chest x-ray.  Will treat him with Augmentin and Z-Pak. - RTC 2 weeks for follow-up.   2.  Osteopenia (DEXA scan on 05/17/2022 T score -2.3): - Denosumab to decrease SRE was previously discussed and he denied it. - Continue calcium and vitamin D supplements.    No orders of the defined types were placed in this encounter.     Alben Deeds Teague,acting as a Neurosurgeon for Doreatha Massed, MD.,have documented all relevant documentation on the behalf of Doreatha Massed, MD,as directed by  Doreatha Massed, MD while in the presence of Doreatha Massed, MD.  ***   [name]   1/21/202512:43 PM  CHIEF COMPLAINT:   Diagnosis: castration refractory prostate cancer to the lymph nodes    Cancer Staging  Prostate cancer metastatic to intrapelvic lymph node [facility]) Staging form: Prostate, AJCC 7th Edition - Clinical: Stage IV (T1c,  N1, M1a, PSA: Less than 10, Gleason 8-10) - Unsigned    Prior Therapy:  Prostate IMRT from 12/13/2016 through 02/09/2017, 78 Gray in 40 fractions with ADT  Enzalutamide started on 04/03/2022, discontinued due to extreme fatigue, vision changes and jaw pain Apalutamide started on 05/01/2022, taking only 2 pills but  experiences jaw pain and neck pain and vision changes.  Discontinued on 05/09/2022 due to poor tolerance XRT with Dr. Kathrynn Running from 05/30/2022 through 06/30/2022  Abiraterone 1000 mg and prednisone 5 mg, 07/06/2022 through 11/01/2023  Current Therapy:  Lupron every 6 months- started 09/01/2020    HISTORY OF PRESENT ILLNESS:   Oncology History  Prostate cancer metastatic to intrapelvic lymph node (HCC)  09/29/2016 Initial Diagnosis   Prostate cancer metastatic to intrapelvic lymph node (HCC)    Genetic Testing   Negative genetic testing. No pathogenic variants identified on the Common Hereditary Cancers+RNA panel. The report date is 06/23/2022.  The Common Hereditary Cancers Panel + RNA offered by [facility] includes sequencing and/or deletion duplication testing of the following 47 genes: APC, ATM, AXIN2, BARD1, BMPR1A, BRCA1, BRCA2, BRIP1, CDH1, CDKN2A (p14ARF), CDKN2A (p16INK4a), CKD4, CHEK2, CTNNA1, DICER1, EPCAM (Deletion/duplication testing only), GREM1 (promoter region deletion/duplication testing only), KIT, MEN1, MLH1, MSH2, MSH3, MSH6, MUTYH, NBN, NF1, NHTL1, PALB2, PDGFRA, PMS2, POLD1, POLE, PTEN, RAD50, RAD51C, RAD51D, SDHB, SDHC, SDHD, SMAD4, SMARCA4. STK11, TP53, TSC1, TSC2, and VHL.  The following genes were evaluated for sequence changes only: SDHA and HOXB13 c.251G>A variant only.   12/05/2023 -  Chemotherapy   Patient is on Treatment Plan : PROSTATE Docetaxel (75) + Prednisone q21d        INTERVAL HISTORY:   Stephen Palmer is a 73 y.o. male presenting to clinic today for follow up of castration refractory prostate cancer to the lymph nodes. He was last seen by me on 12/26/23.  Today, he states that he is doing well overall. His appetite level is at ***%. His energy level is at ***%.  PAST MEDICAL HISTORY:   Past Medical History: Past Medical History:  Diagnosis Date   Abdominal aortic aneurysm (AAA) (HCC)    Anxiety    COPD (chronic obstructive pulmonary disease) (HCC)    Coronary  artery calcification seen on CT scan    Essential hypertension    Lung cancer (HCC)    Non small cell carcinoma - XRT   Non-small cell carcinoma of left lung, stage 1 (HCC) 09/21/2017   Prostate cancer (HCC)    XRT   Stroke (HCC)    Type 2 diabetes mellitus (HCC)     Surgical History: Past Surgical History:  Procedure Laterality Date   COLONOSCOPY N/A 11/23/2015   Procedure: COLONOSCOPY;  Surgeon: Franky Macho, MD;  Location: [facility];  Service: Gastroenterology;  Laterality: N/A;   FRACTURE SURGERY     IR IMAGING GUIDED PORT INSERTION  11/20/2023   PROSTATE BIOPSY     SINUS SURGERY WITH INSTATRAK      Social History: Social History   Socioeconomic History   Marital status: Married    Spouse name: Not on file   Number of children: Not on file   Years of education: Not on file   Highest education level: Not on file  Occupational History   Not on file  Tobacco Use   Smoking status: Every Day    Current packs/day: 2.00    Average packs/day: 2.0 packs/day for 44.0 years (88.0 ttl pk-yrs)    Types: Cigarettes   Smokeless tobacco: Never  Vaping Use  Vaping status: Never Used  Substance and Sexual Activity   Alcohol use: No    Alcohol/week: 0.0 standard drinks of alcohol   Drug use: No   Sexual activity: Not on file  Other Topics Concern   Not on file  Social History Narrative   Not on file   Social Drivers of Health   Financial Resource Strain: Not on file  Food Insecurity: No Food Insecurity (05/01/2023)   Hunger Vital Sign    Worried About Running Out of Food in the Last Year: Never true    Ran Out of Food in the Last Year: Never true  Transportation Needs: No Transportation Needs (05/01/2023)   PRAPARE - Administrator, Civil Service (Medical): No    Lack of Transportation (Non-Medical): No  Physical Activity: Not on file  Stress: Not on file  Social Connections: Not on file  Intimate Partner Violence: Not At Risk (05/01/2023)   Humiliation,  Afraid, Rape, and Kick questionnaire    Fear of Current or Ex-Partner: No    Emotionally Abused: No    Physically Abused: No    Sexually Abused: No    Family History: Family History  Problem Relation Age of Onset   Heart disease Mother    Skin cancer Mother    Heart disease Father        before age 90   Skin cancer Brother    Breast cancer Maternal Aunt    Cancer Neg Hx     Current Medications:  Current Outpatient Medications:    alfuzosin (UROXATRAL) 10 MG 24 hr tablet, Take 1 tablet (10 mg total) by mouth daily with breakfast., Disp: 30 tablet, Rfl: 11   alprazolam (XANAX) 2 MG tablet, Take 2 mg by mouth 2 (two) times daily., Disp: , Rfl:    amoxicillin-clavulanate (AUGMENTIN) 875-125 MG tablet, Take 1 tablet by mouth 2 (two) times daily., Disp: 14 tablet, Rfl: 0   azithromycin (ZITHROMAX Z-PAK) 250 MG tablet, Take 2 tablets by mouth on the first day and take 1 tablet by mouth daily until complete, Disp: 6 each, Rfl: 0   Blood Glucose Monitoring Suppl (ACCU-CHEK GUIDE ME) w/Device KIT, 1 Piece by Does not apply route as directed., Disp: 1 kit, Rfl: 0   citalopram (CELEXA) 40 MG tablet, Take 20-40 mg by mouth daily., Disp: , Rfl:    glipiZIDE 2.5 MG TABS, Take 2.5 mg by mouth daily before breakfast., Disp: 90 tablet, Rfl: 1   glucose blood (ACCU-CHEK GUIDE) test strip, Use to test glucose 4 times a day., Disp: 200 each, Rfl: 2   ivermectin (STROMECTOL) 3 MG TABS tablet, SMARTSIG:5 Tablet(s) By Mouth Once, Disp: , Rfl:    lidocaine-prilocaine (EMLA) cream, Apply to affected area once, Disp: 30 g, Rfl: 3   megestrol (MEGACE) 400 MG/10ML suspension, Take 10 mLs (400 mg total) by mouth 2 (two) times daily., Disp: 480 mL, Rfl: 1   multivitamin (ONE-A-DAY MEN'S) TABS tablet, Take 1 tablet by mouth daily., Disp: , Rfl:    predniSONE (DELTASONE) 5 MG tablet, Take 1 tablet (5 mg total) by mouth daily with breakfast., Disp: 30 tablet, Rfl: 11   prochlorperazine (COMPAZINE) 10 MG tablet,  Take 1 tablet (10 mg total) by mouth every 6 (six) hours as needed for nausea or vomiting., Disp: 60 tablet, Rfl: 3   rosuvastatin (CRESTOR) 10 MG tablet, TAKE 1 TABLET BY MOUTH DAILY, Disp: 90 tablet, Rfl: 3  Current Facility-Administered Medications:    leuprolide (6 Month) (ELIGARD)  injection 45 mg, 45 mg, Subcutaneous, Q6 months, , 45 mg at 09/12/23 1447  Facility-Administered Medications Ordered in Other Visits:    0.9 %  sodium chloride infusion, , Intravenous, Continuous, Doreatha Massed, MD, Stopped at 12/05/23 1310   Allergies: Allergies  Allergen Reactions   Sulfa Antibiotics Hives, Itching and Swelling    Tongue swelling   Sulfasalazine Hives, Itching and Swelling    Tongue swelling   Lipitor [Atorvastatin] Other (See Comments)    myalgia   Simvastatin Other (See Comments)    myalgia   Iodinated Contrast Media Nausea And Vomiting    REVIEW OF SYSTEMS:   Review of Systems  Constitutional:  Negative for chills, fatigue and fever.  HENT:   Negative for lump/mass, mouth sores, nosebleeds, sore throat and trouble swallowing.   Eyes:  Negative for eye problems.  Respiratory:  Negative for cough and shortness of breath.   Cardiovascular:  Negative for chest pain, leg swelling and palpitations.  Gastrointestinal:  Negative for abdominal pain, constipation, diarrhea, nausea and vomiting.  Genitourinary:  Negative for bladder incontinence, difficulty urinating, dysuria, frequency, hematuria and nocturia.   Musculoskeletal:  Negative for arthralgias, back pain, flank pain, myalgias and neck pain.  Skin:  Negative for itching and rash.  Neurological:  Negative for dizziness, headaches and numbness.  Hematological:  Does not bruise/bleed easily.  Psychiatric/Behavioral:  Negative for depression, sleep disturbance and suicidal ideas. The patient is not nervous/anxious.   All other systems reviewed and are negative.    VITALS:   There were no vitals taken for this visit.   Wt Readings from Last 3 Encounters:  12/26/23 141 lb 9.6 oz (64.2 kg)  12/05/23 146 lb 3.2 oz (66.3 kg)  11/20/23 150 lb 5.7 oz (68.2 kg)    There is no height or weight on file to calculate BMI.  Performance status (ECOG): 1 - Symptomatic but completely ambulatory  PHYSICAL EXAM:   Physical Exam Vitals and nursing note reviewed. Exam conducted with a chaperone present.  Constitutional:      Appearance: Normal appearance.  Cardiovascular:     Rate and Rhythm: Normal rate and regular rhythm.     Pulses: Normal pulses.     Heart sounds: Normal heart sounds.  Pulmonary:     Effort: Pulmonary effort is normal.     Breath sounds: Normal breath sounds.  Abdominal:     Palpations: Abdomen is soft. There is no hepatomegaly, splenomegaly or mass.     Tenderness: There is no abdominal tenderness.  Musculoskeletal:     Right lower leg: No edema.     Left lower leg: No edema.  Lymphadenopathy:     Cervical: No cervical adenopathy.     Right cervical: No superficial, deep or posterior cervical adenopathy.    Left cervical: No superficial, deep or posterior cervical adenopathy.     Upper Body:     Right upper body: No supraclavicular or axillary adenopathy.     Left upper body: No supraclavicular or axillary adenopathy.  Neurological:     General: No focal deficit present.     Mental Status: He is alert and oriented to person, place, and time.  Psychiatric:        Mood and Affect: Mood normal.        Behavior: Behavior normal.     LABS:      Latest Ref Rng & Units 12/26/2023   11:38 AM 12/05/2023    9:21 AM 10/08/2023   10:51 AM  CBC  WBC 4.0 - 10.5 K/uL 14.5  8.6  10.1   Hemoglobin 13.0 - 17.0 g/dL 65.7  84.6  96.2   Hematocrit 39.0 - 52.0 % 35.1  40.7  46.4   Platelets 150 - 400 K/uL 297  191  197       Latest Ref Rng & Units 12/26/2023   11:38 AM 12/05/2023    9:21 AM 10/08/2023   10:51 AM  CMP  Glucose 70 - 99 mg/dL 952  841  324   BUN 8 - 23 mg/dL 18  22  18     Creatinine 0.61 - 1.24 mg/dL 4.01  0.27  2.53   Sodium 135 - 145 mmol/L 133  139  136   Potassium 3.5 - 5.1 mmol/L 3.4  3.7  3.8   Chloride 98 - 111 mmol/L 95  99  100   CO2 22 - 32 mmol/L 29  29  27    Calcium 8.9 - 10.3 mg/dL 8.9  9.3  9.1   Total Protein 6.5 - 8.1 g/dL 6.9  6.8  7.4   Total Bilirubin 0.0 - 1.2 mg/dL 0.4  0.3  0.4   Alkaline Phos 38 - 126 U/L 69  68  70   AST 15 - 41 U/L 49  27  20   ALT 0 - 44 U/L 77  28  20      No results found for: "CEA1", "CEA" / No results found for: "CEA1", "CEA" Lab Results  Component Value Date   PSA1 4.0 12/17/2023   No results found for: "GUY403" No results found for: "CAN125"  No results found for: "TOTALPROTELP", "ALBUMINELP", "A1GS", "A2GS", "BETS", "BETA2SER", "GAMS", "MSPIKE", "SPEI" No results found for: "TIBC", "FERRITIN", "IRONPCTSAT" No results found for: "LDH"   STUDIES:   DG Chest 2 View Result Date: 12/31/2023 CLINICAL DATA:  73 year old male with productive cough and shortness of breath EXAM: CHEST - 2 VIEW COMPARISON:  05/15/2019 PET-CT 10/25/2023 FINDINGS: Cardiomediastinal silhouette unchanged. No interlobular septal thickening. Interval placement of right IJ port catheter. Stigmata of emphysema, with increased retrosternal airspace, flattened hemidiaphragms, increased AP diameter, and hyperinflation on the AP view. Linear opacity is more conspicuous in the left suprahilar region extending to the pleura. Coarsened interstitial markings throughout. No new airspace disease. No pneumothorax or pleural effusion. IMPRESSION: Linear scarring/opacity in the left suprahilar region is presumably post inflammatory or post treatment changes, less likely acute bronchitis. Emphysema Electronically Signed   By: Gilmer Mor D.O.   On: 12/31/2023 11:06

## 2024-01-09 ENCOUNTER — Inpatient Hospital Stay: Payer: Medicare Other | Admitting: Hematology

## 2024-01-09 ENCOUNTER — Inpatient Hospital Stay: Payer: Medicare Other

## 2024-01-10 DIAGNOSIS — F419 Anxiety disorder, unspecified: Secondary | ICD-10-CM | POA: Diagnosis not present

## 2024-01-11 ENCOUNTER — Inpatient Hospital Stay: Payer: Medicare Other

## 2024-01-14 ENCOUNTER — Emergency Department (HOSPITAL_COMMUNITY)
Admission: EM | Admit: 2024-01-14 | Discharge: 2024-01-15 | Disposition: A | Discharge status: Home / Self Care | Payer: Medicare Other | Attending: Emergency Medicine | Admitting: Emergency Medicine

## 2024-01-14 ENCOUNTER — Emergency Department (HOSPITAL_COMMUNITY): Payer: Medicare Other

## 2024-01-14 ENCOUNTER — Other Ambulatory Visit: Payer: Self-pay

## 2024-01-14 DIAGNOSIS — I714 Abdominal aortic aneurysm, without rupture, unspecified: Secondary | ICD-10-CM | POA: Diagnosis not present

## 2024-01-14 DIAGNOSIS — Z79899 Other long term (current) drug therapy: Secondary | ICD-10-CM | POA: Insufficient documentation

## 2024-01-14 DIAGNOSIS — M25552 Pain in left hip: Secondary | ICD-10-CM | POA: Diagnosis not present

## 2024-01-14 DIAGNOSIS — N281 Cyst of kidney, acquired: Secondary | ICD-10-CM | POA: Diagnosis not present

## 2024-01-14 DIAGNOSIS — R0781 Pleurodynia: Secondary | ICD-10-CM | POA: Diagnosis not present

## 2024-01-14 DIAGNOSIS — Z9181 History of falling: Secondary | ICD-10-CM | POA: Diagnosis present

## 2024-01-14 DIAGNOSIS — M25551 Pain in right hip: Secondary | ICD-10-CM | POA: Diagnosis not present

## 2024-01-14 DIAGNOSIS — R918 Other nonspecific abnormal finding of lung field: Secondary | ICD-10-CM | POA: Diagnosis not present

## 2024-01-14 DIAGNOSIS — S3210XA Unspecified fracture of sacrum, initial encounter for closed fracture: Secondary | ICD-10-CM | POA: Diagnosis not present

## 2024-01-14 DIAGNOSIS — K5909 Other constipation: Secondary | ICD-10-CM

## 2024-01-14 DIAGNOSIS — W01198A Fall on same level from slipping, tripping and stumbling with subsequent striking against other object, initial encounter: Secondary | ICD-10-CM | POA: Diagnosis not present

## 2024-01-14 DIAGNOSIS — J439 Emphysema, unspecified: Secondary | ICD-10-CM | POA: Diagnosis not present

## 2024-01-14 DIAGNOSIS — S0990XA Unspecified injury of head, initial encounter: Secondary | ICD-10-CM | POA: Insufficient documentation

## 2024-01-14 DIAGNOSIS — Z7984 Long term (current) use of oral hypoglycemic drugs: Secondary | ICD-10-CM | POA: Insufficient documentation

## 2024-01-14 DIAGNOSIS — K5730 Diverticulosis of large intestine without perforation or abscess without bleeding: Secondary | ICD-10-CM | POA: Insufficient documentation

## 2024-01-14 DIAGNOSIS — R531 Weakness: Secondary | ICD-10-CM | POA: Insufficient documentation

## 2024-01-14 DIAGNOSIS — S34139A Unspecified injury to sacral spinal cord, initial encounter: Secondary | ICD-10-CM | POA: Diagnosis present

## 2024-01-14 DIAGNOSIS — I6782 Cerebral ischemia: Secondary | ICD-10-CM | POA: Diagnosis not present

## 2024-01-14 DIAGNOSIS — M47812 Spondylosis without myelopathy or radiculopathy, cervical region: Secondary | ICD-10-CM | POA: Diagnosis not present

## 2024-01-14 DIAGNOSIS — Z8546 Personal history of malignant neoplasm of prostate: Secondary | ICD-10-CM | POA: Insufficient documentation

## 2024-01-14 DIAGNOSIS — K5900 Constipation, unspecified: Secondary | ICD-10-CM | POA: Insufficient documentation

## 2024-01-14 DIAGNOSIS — S199XXA Unspecified injury of neck, initial encounter: Secondary | ICD-10-CM | POA: Diagnosis not present

## 2024-01-14 DIAGNOSIS — W19XXXA Unspecified fall, initial encounter: Secondary | ICD-10-CM

## 2024-01-14 DIAGNOSIS — R100 Acute abdomen: Secondary | ICD-10-CM | POA: Diagnosis present

## 2024-01-14 DIAGNOSIS — M160 Bilateral primary osteoarthritis of hip: Secondary | ICD-10-CM | POA: Diagnosis not present

## 2024-01-14 LAB — CBC WITH DIFFERENTIAL/PLATELET
Abs Immature Granulocytes: 0.04 10*3/uL (ref 0.00–0.07)
Basophils Absolute: 0 10*3/uL (ref 0.0–0.1)
Basophils Relative: 0 %
Eosinophils Absolute: 0.1 10*3/uL (ref 0.0–0.5)
Eosinophils Relative: 1 %
HCT: 36.2 % — ABNORMAL LOW (ref 39.0–52.0)
Hemoglobin: 11.7 g/dL — ABNORMAL LOW (ref 13.0–17.0)
Immature Granulocytes: 0 %
Lymphocytes Relative: 6 %
Lymphs Abs: 0.6 10*3/uL — ABNORMAL LOW (ref 0.7–4.0)
MCH: 30.8 pg (ref 26.0–34.0)
MCHC: 32.3 g/dL (ref 30.0–36.0)
MCV: 95.3 fL (ref 80.0–100.0)
Monocytes Absolute: 0.7 10*3/uL (ref 0.1–1.0)
Monocytes Relative: 7 %
Neutro Abs: 9.8 10*3/uL — ABNORMAL HIGH (ref 1.7–7.7)
Neutrophils Relative %: 86 %
Platelets: 377 10*3/uL (ref 150–400)
RBC: 3.8 MIL/uL — ABNORMAL LOW (ref 4.22–5.81)
RDW: 13.5 % (ref 11.5–15.5)
WBC: 11.3 10*3/uL — ABNORMAL HIGH (ref 4.0–10.5)
nRBC: 0 % (ref 0.0–0.2)

## 2024-01-14 LAB — COMPREHENSIVE METABOLIC PANEL
ALT: 22 U/L (ref 0–44)
AST: 19 U/L (ref 15–41)
Albumin: 3.5 g/dL (ref 3.5–5.0)
Alkaline Phosphatase: 83 U/L (ref 38–126)
Anion gap: 14 (ref 5–15)
BUN: 22 mg/dL (ref 8–23)
CO2: 25 mmol/L (ref 22–32)
Calcium: 9.5 mg/dL (ref 8.9–10.3)
Chloride: 99 mmol/L (ref 98–111)
Creatinine, Ser: 0.9 mg/dL (ref 0.61–1.24)
GFR, Estimated: >60 mL/min (ref >60)
Glucose, Bld: 128 mg/dL — ABNORMAL HIGH (ref 70–99)
Potassium: 4.1 mmol/L (ref 3.5–5.1)
Sodium: 138 mmol/L (ref 135–145)
Total Bilirubin: 0.8 mg/dL (ref 0.0–1.2)
Total Protein: 7.9 g/dL (ref 6.5–8.1)

## 2024-01-14 MED ORDER — HYDROCODONE-ACETAMINOPHEN 5-325 MG PO TABS
1.0000 | ORAL_TABLET | Freq: Once | ORAL | Status: AC
  Administered 2024-01-14: 1 via ORAL
  Filled 2024-01-14: qty 1

## 2024-01-14 MED ORDER — SODIUM CHLORIDE 0.9 % IV BOLUS
1000.0000 mL | Freq: Once | INTRAVENOUS | Status: AC
  Administered 2024-01-15: 1000 mL via INTRAVENOUS

## 2024-01-14 NOTE — ED Provider Triage Note (Signed)
Emergency Medicine Provider Triage Evaluation Note  Stephen Palmer , a 73 y.o. male  was evaluated in triage.  Pt complains of generalized weakness causing falls over recent weeks.  Active prostate cancer patient under the care of Dr. Ellin Saba,  last chemo Jan 8.  Reduced appetite, weight loss, fall several times on buttocks,  persistent pain pelvis and coccyx area -difficulty sitting .  Review of Systems  Positive: Weakness, poor appetite,  fall with pain in buttocks Negative: Vomiting,  fever,   Physical Exam  BP (!) 145/97 (BP Location: Right Arm)   Pulse (!) 117   Temp (!) 97.5 F (36.4 C) (Oral)   Resp 16   SpO2 98%  Gen:   Awake, no distress  , appears fatigued Resp:  Normal effort  MSK:   Moves extremities without difficulty  Other:    Medical Decision Making  Medically screening exam initiated at 8:22 PM.  Appropriate orders placed.  Stephen Palmer. was informed that the remainder of the evaluation will be completed by another provider, this initial triage assessment does not replace that evaluation, and the importance of remaining in the ED until their evaluation is complete.     Burgess Amor, PA-C 01/14/24 2025

## 2024-01-14 NOTE — ED Provider Notes (Signed)
Stephen Palmer   CSN: [id number] Arrival date & time: 01/14/24  1637     History  Chief Complaint  Patient presents with   Stephen Palmer Anastasia. is a 73 y.o. male with history of metastatic prostate cancer on chemo presents following multiple falls in the past week.  States he did hit his head during one of the falls.  Did not lose consciousness.  He is not on blood thinners.  He is accompanied by his wife who states he has been behaving at baseline without any vomiting or changes in speech   Denies any preceding chest pain, shortness of breath, dizziness to the falls.  Denies any headache, blurry vision, dizziness following the falls.  Patient states he just feels weak, he has had a decreased appetite.  He does describe some lower abdominal pain in addition to constipation x 8 days.  Denies any urinary symptoms.   Fall       Home Medications Prior to Admission medications   Medication Sig Start Date End Date Taking? Authorizing Provider  alfuzosin (UROXATRAL) 10 MG 24 hr tablet Take 1 tablet (10 mg total) by mouth daily with breakfast. 06/15/23   McKenzie, Mardene Celeste, MD  alprazolam Prudy Feeler) 2 MG tablet Take 2 mg by mouth 2 (two) times daily.    [provider]  amoxicillin-clavulanate (AUGMENTIN) 875-125 MG tablet Take 1 tablet by mouth 2 (two) times daily. 12/26/23   Doreatha Massed, MD  azithromycin (ZITHROMAX Z-PAK) 250 MG tablet Take 2 tablets by mouth on the first day and take 1 tablet by mouth daily until complete 12/26/23   Doreatha Massed, MD  Blood Glucose Monitoring Suppl (ACCU-CHEK GUIDE ME) w/Device KIT 1 Piece by Does not apply route as directed. 05/16/22   Roma Kayser, MD  citalopram (CELEXA) 40 MG tablet Take 20-40 mg by mouth daily. 02/21/22   [provider]  glipiZIDE 2.5 MG TABS Take 2.5 mg by mouth daily before breakfast. 09/12/23   Nida, Denman George, MD  glucose blood  (ACCU-CHEK GUIDE) test strip Use to test glucose 4 times a day. 05/16/22   Roma Kayser, MD  ivermectin (STROMECTOL) 3 MG TABS tablet SMARTSIG:5 Tablet(s) By Mouth Once 08/23/23   [provider]  lidocaine-prilocaine (EMLA) cream Apply to affected area once 11/26/23   Doreatha Massed, MD  megestrol (MEGACE) 400 MG/10ML suspension Take 10 mLs (400 mg total) by mouth 2 (two) times daily. 12/26/23   Doreatha Massed, MD  multivitamin (ONE-A-DAY MEN'S) TABS tablet Take 1 tablet by mouth daily.    [provider]  predniSONE (DELTASONE) 5 MG tablet Take 1 tablet (5 mg total) by mouth daily with breakfast. 11/26/23   Doreatha Massed, MD  prochlorperazine (COMPAZINE) 10 MG tablet Take 1 tablet (10 mg total) by mouth every 6 (six) hours as needed for nausea or vomiting. 11/26/23   Doreatha Massed, MD  rosuvastatin (CRESTOR) 10 MG tablet TAKE 1 TABLET BY MOUTH DAILY 08/09/23   Malen Gauze, MD      Allergies    Sulfa antibiotics, Sulfasalazine, Lipitor [atorvastatin], Simvastatin, and Iodinated contrast media    Review of Systems   Review of Systems  Musculoskeletal:  Positive for arthralgias and myalgias.    Physical Exam Updated Vital Signs BP (!) 166/114   Pulse (!) 111   Temp (!) 97.5 F (36.4 C) (Oral)   Resp 18   SpO2 100%  Physical Exam  Vitals and nursing Palmer reviewed.  Constitutional:      Appearance: He is well-developed.     Comments: Chronically ill-appearing in no acute distress  HENT:     Head: Normocephalic and atraumatic.  Eyes:     Conjunctiva/sclera: Conjunctivae normal.  Cardiovascular:     Rate and Rhythm: Normal rate and regular rhythm.     Heart sounds: No murmur heard. Pulmonary:     Effort: Pulmonary effort is normal. No respiratory distress.     Breath sounds: Normal breath sounds.  Abdominal:     Palpations: Abdomen is soft.     Comments: Supra pubic abdominal tenderness  Musculoskeletal:        General: No  swelling.     Cervical back: Neck supple.  Skin:    General: Skin is warm and dry.     Capillary Refill: Capillary refill takes less than 2 seconds.  Neurological:     Mental Status: He is alert.  Psychiatric:        Mood and Affect: Mood normal.     ED Results / Procedures / Treatments   Labs (all labs ordered are listed, but only abnormal results are displayed) Labs Reviewed  CBC WITH DIFFERENTIAL/PLATELET - Abnormal; Notable for the following components:      Result Value   WBC 11.3 (*)    RBC 3.80 (*)    Hemoglobin 11.7 (*)    HCT 36.2 (*)    Neutro Abs 9.8 (*)    Lymphs Abs 0.6 (*)    All other components within normal limits  COMPREHENSIVE METABOLIC PANEL - Abnormal; Notable for the following components:   Glucose, Bld 128 (*)    All other components within normal limits  URINALYSIS, ROUTINE W REFLEX MICROSCOPIC    EKG None  Radiology CT CHEST ABDOMEN PELVIS WO CONTRAST Result Date: 01/15/2024 CLINICAL DATA:  History of prostate carcinoma with generalized weakness, initial encounter EXAM: CT CHEST, ABDOMEN AND PELVIS WITHOUT CONTRAST TECHNIQUE: Multidetector CT imaging of the chest, abdomen and pelvis was performed following the standard protocol without IV contrast. RADIATION DOSE REDUCTION: This exam was performed according to the departmental dose-optimization program which includes automated exposure control, adjustment of the mA and/or kV according to patient size and/or use of iterative reconstruction technique. COMPARISON:  04/12/2020 FINDINGS: CT CHEST FINDINGS Cardiovascular: Atherosclerotic calcifications of the aorta and its branches are seen. Heavy coronary calcifications are noted. No cardiac enlargement is seen. No pericardial effusion is noted. Mediastinum/Nodes: Thoracic inlet is within normal limits. No hilar or mediastinal adenopathy is noted. The esophagus as visualized is within normal limits. Lungs/Pleura: Diffuse emphysematous changes are identified  throughout both lungs. Some progressive areas of architectural distortion are noted in the left upper lobe similar to that seen on recent plain film examination this is stable from a prior PET-CT from 10/25/2023. No metabolic activity was noted on that exam consistent with focal extensive scarring. Musculoskeletal: No acute rib abnormality is noted. Chronic changes in the left ribcage similar to that noted on the prior PET-CT are seen. T12 compression deformity is noted with air within likely related to more chronic compression deformity. Correlation with point tenderness is recommended. CT ABDOMEN PELVIS FINDINGS Hepatobiliary: No focal liver abnormality is seen. No gallstones, gallbladder wall thickening, or biliary dilatation. Pancreas: Unremarkable. No pancreatic ductal dilatation or surrounding inflammatory changes. Spleen: Normal in size without focal abnormality. Adrenals/Urinary Tract: Adrenal glands are within normal limits. Multiple cysts are seen in the kidneys bilaterally. No further follow-up is  recommended. No renal calculi or obstructive changes are seen. The bladder is well distended. Stomach/Bowel: Scattered diverticular change of the colon is noted without evidence of diverticulitis. The appendix is within normal limits. Small bowel and stomach are unremarkable. Vascular/Lymphatic: The atherosclerotic calcifications of the abdominal aorta are noted. Dilatation of the aorta to 5.9 cm is noted and stable. No Peri check inflammatory changes to suggest impending rupture are noted. The overall appearance is stable. Iliac atherosclerotic calcifications are seen. No lymphadenopathy is noted. Reproductive: Prostate is within normal limits. Fiducial markers are seen. Other: No abdominal wall hernia or abnormality. No abdominopelvic ascites. Musculoskeletal: No acute or significant osseous findings. IMPRESSION: CT of the chest: Diffuse emphysematous changes with areas of architectural distortion in the left  upper lobe stable from prior PET-CT. T12 compression deformity new from the prior PET-CT of uncertain chronicity. Air is noted within the vertebral body suggesting a more chronic process. Correlate to point tenderness. CT of the abdomen and pelvis: Abdominal aortic aneurysm measuring 5.9 cm. Recommend CTA or MRA, as appropriate, in 6 months and referral to a vascular specialist. Reference: Journal of Vascular Surgery 67.1 (2018): 2-77. J Am Coll Radiol [id number] [id number]. Diverticulosis without diverticulitis. Electronically Signed   By: Alcide Clever M.D.   On: 01/15/2024 00:03   DG Ribs Unilateral W/Chest Left Result Date: 01/14/2024 CLINICAL DATA:  Left rib pain, recent falls. EXAM: LEFT RIBS AND CHEST - 3+ VIEW COMPARISON:  12/26/2023, PET CT 10/25/2023 FINDINGS: No fracture or other bone lesions are seen involving the ribs. There is no pneumothorax or pleural effusion. Stable scarring in the left upper lobe. Right chest port in place. Heart size and mediastinal contours are within normal limits. IMPRESSION: No evidence of left rib fracture or focal rib abnormality. No pulmonary complication related to fall. Electronically Signed   By: Narda Rutherford M.D.   On: 01/14/2024 23:09   CT Head Wo Contrast Result Date: 01/14/2024 CLINICAL DATA:  Trauma EXAM: CT HEAD WITHOUT CONTRAST TECHNIQUE: Contiguous axial images were obtained from the base of the skull through the vertex without intravenous contrast. RADIATION DOSE REDUCTION: This exam was performed according to the departmental dose-optimization program which includes automated exposure control, adjustment of the mA and/or kV according to patient size and/or use of iterative reconstruction technique. COMPARISON:  MRI brain 10/02/2017. FINDINGS: Brain: No evidence of acute infarction, hemorrhage, hydrocephalus, extra-axial collection or mass lesion/mass effect. There is mild diffuse atrophy. There is mild periventricular white matter hypodensity, likely  chronic small vessel ischemic change. 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 Vascular: Atherosclerotic calcifications are present within the cavernous internal carotid arteries. Skull: Normal. Negative for fracture or focal lesion. Sinuses/Orbits: Patient is status post sinonasal surgery bilaterally. There is scattered mucosal thickening throughout ethmoid air cells. There is a small air-fluid level in the left sphenoid sinus. Mastoid air cells are clear. No acute orbital abnormality. Other: None. IMPRESSION: 1. No acute intracranial process. 2. Mild diffuse atrophy and chronic small vessel ischemic change. 3. Small air-fluid level in the left sphenoid sinus. Correlate for acute sinusitis. Electronically Signed   By: Darliss Cheney M.D.   On: 01/14/2024 22:58   CT Cervical Spine Wo Contrast Result Date: 01/14/2024 CLINICAL DATA:  Trauma EXAM: CT CERVICAL SPINE WITHOUT CONTRAST TECHNIQUE: Multidetector CT imaging of the cervical spine was performed without intravenous contrast. Multiplanar CT image reconstructions were also generated. RADIATION DOSE REDUCTION: This exam was performed according to the departmental dose-optimization program which includes automated exposure control,  adjustment of the mA and/or kV according to patient size and/or use of iterative reconstruction technique. COMPARISON:  None Available. FINDINGS: Alignment: Normal. Skull base and vertebrae: There is no acute fracture or dislocation identified. The bones are mildly osteopenic. Soft tissues and spinal canal: No prevertebral fluid or swelling. No visible canal hematoma. Disc levels: There is disc space narrowing and endplate osteophyte formation throughout the cervical spine compatible with degenerative change. This is most significant at C6-C7. There is moderate right-sided neural foraminal stenosis at C4-C5, C5-C6 and C6-C7 secondary to uncovertebral spurring and facet arthropathy. There severe left-sided neural  foraminal stenosis at C6-C7 secondary to uncovertebral spurring. No significant central canal stenosis identified. Upper chest: Severe emphysematous changes are present. Other: Right-sided central venous catheter is partially visualized. There are atherosclerotic calcifications of the bilateral carotid arteries. IMPRESSION: 1. No acute fracture or dislocation of the cervical spine. 2. Degenerative changes of the cervical spine. 3. Severe emphysematous changes of the upper lungs. Emphysema (ICD10-J43.9). Electronically Signed   By: Darliss Cheney M.D.   On: 01/14/2024 22:56   DG Hips Bilat W or Wo Pelvis 3-4 Views Result Date: 01/14/2024 CLINICAL DATA:  Repeated falls with hip pain, initial encounter EXAM: DG HIP (WITH OR WITHOUT PELVIS) 4V BILAT COMPARISON:  None Available. FINDINGS: Pelvic ring is intact. Degenerative changes of the hip joints are noted bilaterally. No acute fracture or dislocation is noted. No soft tissue changes are seen. IMPRESSION: Degenerative change without acute abnormality. Electronically Signed   By: Alcide Clever M.D.   On: 01/14/2024 21:24   DG Sacrum/Coccyx Result Date: 01/14/2024 CLINICAL DATA:  Repetitive falls with sacral pain, initial encounter EXAM: SACRUM AND COCCYX - 2+ VIEW COMPARISON:  None Available. FINDINGS: Pelvic ring is intact. Sacral ala appear intact as well. On the lateral film, there is mild cortical irregularity identified in the distal sacrum. No soft tissue changes are noted. IMPRESSION: Mildly displaced fracture in the distal sacrum. Electronically Signed   By: Alcide Clever M.D.   On: 01/14/2024 21:24    Procedures Procedures    Medications Ordered in ED Medications  alfuzosin (UROXATRAL) 24 hr tablet 10 mg (has no administration in time range)  ALPRAZolam (XANAX) tablet 2 mg (has no administration in time range)  Accu-Chek Guide Me KIT 1 Piece (has no administration in time range)  citalopram (CELEXA) tablet 40 mg (has no administration in time  range)  glipiZIDE TABS 2.5 mg (has no administration in time range)  predniSONE (DELTASONE) tablet 5 mg (has no administration in time range)  rosuvastatin (CRESTOR) tablet 10 mg (has no administration in time range)  lidocaine-prilocaine (EMLA) cream (has no administration in time range)  prochlorperazine (COMPAZINE) tablet 10 mg (has no administration in time range)  megestrol (MEGACE) 400 MG/10ML suspension 400 mg (has no administration in time range)  glucose blood test strip STRP 1 each (has no administration in time range)  HYDROcodone-acetaminophen (NORCO/VICODIN) 5-325 MG per tablet 1 tablet (1 tablet Oral Given 01/14/24 2310)  sodium chloride 0.9 % bolus 1,000 mL (1,000 mLs Intravenous New Bag/Given 01/15/24 0018)    ED Course/ Medical Decision Making/ A&P                                 Medical Decision Making Amount and/or Complexity of Data Reviewed Radiology: ordered.  Risk OTC drugs. Prescription drug management.   This patient presents to the ED with chief complaint(s) of falls.  The complaint involves an extensive differential diagnosis and also carries with it a high risk of complications and morbidity.   pertinent past medical history as listed in HPI  The differential diagnosis includes  Intracranial hemorrhage, fracture, sprain, contusion, sepsis, UTI, pneumonia, appendicitis, gastroenteritis, colitis, diverticulitis, cholecystitis The initial plan is to  Start with basic labs, imaging Additional history obtained: Additional history obtained from spouse Records reviewed previous admission documents and Care Everywhere/External Records  Initial Assessment:   Patient presents with tachycardic to 111 and hypertensive  Independent ECG interpretation:  Sinus rhythm   Independent labs interpretation:  The following labs were independently interpreted:  CMP without significant abnormality, CBC with mild leukocytosis of 11.3, UA pending  Independent  visualization and interpretation of imaging: I independently visualized the following imaging with scope of interpretation limited to determining acute life threatening conditions related to emergency care: CT head, cervical spine, left ribs which revealed no acute abnormality, cervical x-rays demonstrate mildly displaced distal  Treatment and Reassessment: Patient given Norco and fluid bolus following first assessment  Consultations obtained:   Discussed sacral fracture with Dr. Romeo Apple, agreed non-op [facility] PT Discussed patient with Dr. Preston Fleeting, he is in agreement with plan.  Disposition:   Anticipate discharge home with home health vs facility placement pending TOC eval.   The patient has been appropriately medically screened and/or stabilized in the ED. I have low suspicion for any other emergent medical condition which would require further screening, evaluation or treatment in the ED or require inpatient management. At time of discharge the patient is hemodynamically stable and in no acute distress. I have discussed work-up results and diagnosis with patient and answered all questions. Patient is agreeable with discharge plan. We discussed strict return precautions for returning to the emergency department and they verbalized understanding.     Social Determinants of Health:   none  This Palmer was dictated with voice recognition software.  Despite best efforts at proofreading, errors may have occurred which can change the documentation meaning.          Final Clinical Impression(s) / ED Diagnoses Final diagnoses:  Fall, initial encounter  Closed fracture of sacrum, unspecified portion of sacrum, initial encounter [facility])  Other constipation    Rx / DC Orders ED Discharge Orders     None         Halford Decamp, PA-C 01/15/24 0054    Gloris Manchester, MD 01/16/24 [phone]

## 2024-01-14 NOTE — ED Provider Notes (Incomplete)
[place] EMERGENCY DEPARTMENT AT [facility] Provider Note   CSN: [id number] Arrival date & time: 01/14/24  1637     History {Add pertinent medical, surgical, social history, OB history to HPI:1} Chief Complaint  Patient presents with  . Fall    Stephen Palmer. is a 73 y.o. male with history of metastatic prostate cancer on chemo presents following multiple falls in the past week.  States he did hit his head during one of the falls.  Did not lose consciousness.  He is not on blood thinners.  He is accompanied by his wife who states he has been behaving at baseline without any vomiting or changes in speech   Denies any preceding chest pain, shortness of breath, dizziness to the falls.  Denies any headache, blurry vision, dizziness following the falls.  Patient states he just feels weak, he has had a decreased appetite.  He does describe some lower abdominal pain in addition to constipation x 8 days.  Denies any urinary symptoms.   Fall       Home Medications Prior to Admission medications   Medication Sig Start Date End Date Taking? Authorizing Provider  alfuzosin (UROXATRAL) 10 MG 24 hr tablet Take 1 tablet (10 mg total) by mouth daily with breakfast. 06/15/23   McKenzie, Mardene Celeste, MD  alprazolam Prudy Feeler) 2 MG tablet Take 2 mg by mouth 2 (two) times daily.    [provider]  amoxicillin-clavulanate (AUGMENTIN) 875-125 MG tablet Take 1 tablet by mouth 2 (two) times daily. 12/26/23   Doreatha Massed, MD  azithromycin (ZITHROMAX Z-PAK) 250 MG tablet Take 2 tablets by mouth on the first day and take 1 tablet by mouth daily until complete 12/26/23   Doreatha Massed, MD  Blood Glucose Monitoring Suppl (ACCU-CHEK GUIDE ME) w/Device KIT 1 Piece by Does not apply route as directed. 05/16/22   Roma Kayser, MD  citalopram (CELEXA) 40 MG tablet Take 20-40 mg by mouth daily. 02/21/22   [provider]  glipiZIDE 2.5 MG TABS Take 2.5 mg by mouth daily  before breakfast. 09/12/23   Nida, Denman George, MD  glucose blood (ACCU-CHEK GUIDE) test strip Use to test glucose 4 times a day. 05/16/22   Roma Kayser, MD  ivermectin (STROMECTOL) 3 MG TABS tablet SMARTSIG:5 Tablet(s) By Mouth Once 08/23/23   [provider]  lidocaine-prilocaine (EMLA) cream Apply to affected area once 11/26/23   Doreatha Massed, MD  megestrol (MEGACE) 400 MG/10ML suspension Take 10 mLs (400 mg total) by mouth 2 (two) times daily. 12/26/23   Doreatha Massed, MD  multivitamin (ONE-A-DAY MEN'S) TABS tablet Take 1 tablet by mouth daily.    [provider]  predniSONE (DELTASONE) 5 MG tablet Take 1 tablet (5 mg total) by mouth daily with breakfast. 11/26/23   Doreatha Massed, MD  prochlorperazine (COMPAZINE) 10 MG tablet Take 1 tablet (10 mg total) by mouth every 6 (six) hours as needed for nausea or vomiting. 11/26/23   Doreatha Massed, MD  rosuvastatin (CRESTOR) 10 MG tablet TAKE 1 TABLET BY MOUTH DAILY 08/09/23   Malen Gauze, MD      Allergies    Sulfa antibiotics, Sulfasalazine, Lipitor [atorvastatin], Simvastatin, and Iodinated contrast media    Review of Systems   Review of Systems  Musculoskeletal:  Positive for arthralgias and myalgias.    Physical Exam Updated Vital Signs BP (!) 145/97 (BP Location: Right Arm)   Pulse (!) 117   Temp (!) 97.5 F (36.4  C) (Oral)   Resp 16   SpO2 98%  Physical Exam Vitals and nursing note reviewed.  Constitutional:      Appearance: He is well-developed.     Comments: Chronically ill-appearing in no acute distress  HENT:     Head: Normocephalic and atraumatic.  Eyes:     Conjunctiva/sclera: Conjunctivae normal.  Cardiovascular:     Rate and Rhythm: Normal rate and regular rhythm.     Heart sounds: No murmur heard. Pulmonary:     Effort: Pulmonary effort is normal. No respiratory distress.     Breath sounds: Normal breath sounds.  Abdominal:     Palpations: Abdomen is  soft.     Comments: Supra pubic abdominal tenderness  Musculoskeletal:        General: No swelling.     Cervical back: Neck supple.  Skin:    General: Skin is warm and dry.     Capillary Refill: Capillary refill takes less than 2 seconds.  Neurological:     Mental Status: He is alert.  Psychiatric:        Mood and Affect: Mood normal.     ED Results / Procedures / Treatments   Labs (all labs ordered are listed, but only abnormal results are displayed) Labs Reviewed  CBC WITH DIFFERENTIAL/PLATELET  COMPREHENSIVE METABOLIC PANEL  URINALYSIS, ROUTINE W REFLEX MICROSCOPIC    EKG None  Radiology No results found.  Procedures Procedures  {Document cardiac monitor, telemetry assessment procedure when appropriate:1}  Medications Ordered in ED Medications  HYDROcodone-acetaminophen (NORCO/VICODIN) 5-325 MG per tablet 1 tablet (has no administration in time range)    ED Course/ Medical Decision Making/ A&P   {   Click here for ABCD2, HEART and other calculatorsREFRESH Note before signing :1}                              Medical Decision Making  This patient presents to the ED with chief complaint(s) of falls.  The complaint involves an extensive differential diagnosis and also carries with it a high risk of complications and morbidity.   pertinent past medical history as listed in HPI  The differential diagnosis includes  Intracranial hemorrhage, fracture, sprain, contusion, sepsis, UTI, pneumonia, appendicitis, gastroenteritis, colitis, diverticulitis, cholecystitis The initial plan is to  Start with basic labs, imaging Additional history obtained: Additional history obtained from spouse Records reviewed previous admission documents and Care Everywhere/External Records  Initial Assessment:   Patient presents with tachycardic to 111 and hypertensive  Independent ECG interpretation:  ***  Independent labs interpretation:  The following labs were independently  interpreted:  CMP without significant abnormality, CBC with mild leukocytosis of 11.3,  Independent visualization and interpretation of imaging: I independently visualized the following imaging with scope of interpretation limited to determining acute life threatening conditions related to emergency care: CT head, cervical spine, left ribs which revealed no acute abnormality, cervical x-rays demonstrate mildly displaced distal  Treatment and Reassessment: Patient given Norco and fluid bolus following first assessment  Consultations obtained:   Hospitalist Dr. Thomes Dinning  Disposition:   ***  Social Determinants of Health:   none  This note was dictated with voice recognition software.  Despite best efforts at proofreading, errors may have occurred which can change the documentation meaning.    {Document critical care time when appropriate:1} {Document review of labs and clinical decision tools ie heart score, Chads2Vasc2 etc:1}  {Document your independent review of radiology images, and  any outside records:1} {Document your discussion with family members, caretakers, and with consultants:1} {Document social determinants of health affecting pt's care:1} {Document your decision making why or why not admission, treatments were needed:1} Final Clinical Impression(s) / ED Diagnoses Final diagnoses:  None    Rx / DC Orders ED Discharge Orders     None

## 2024-01-14 NOTE — ED Triage Notes (Signed)
Pt has had multiple falls over the past week, pt fell and hit his head the other day in the kitchen and rolled out of bed and fell on hard wood floor. Pt has a Hx of cancer and kidney disease. States he is generally weak. Wife states he has had a lack  of appetite

## 2024-01-15 ENCOUNTER — Other Ambulatory Visit: Payer: Self-pay

## 2024-01-15 MED ORDER — CITALOPRAM HYDROBROMIDE 20 MG PO TABS
40.0000 mg | ORAL_TABLET | Freq: Every day | ORAL | Status: DC
  Filled 2024-01-15: qty 2

## 2024-01-15 MED ORDER — PREDNISONE 10 MG PO TABS
5.0000 mg | ORAL_TABLET | Freq: Every day | ORAL | Status: DC
  Administered 2024-01-15: 5 mg via ORAL
  Filled 2024-01-15: qty 1

## 2024-01-15 MED ORDER — GLUCOSE BLOOD VI STRP: 1.0000 | ORAL_STRIP | Status: DC | PRN

## 2024-01-15 MED ORDER — PROCHLORPERAZINE MALEATE 10 MG PO TABS: 10.0000 mg | ORAL_TABLET | Freq: Four times a day (QID) | ORAL | Status: DC | PRN

## 2024-01-15 MED ORDER — TRAMADOL HCL 50 MG PO TABS: 50.0000 mg | ORAL_TABLET | Freq: Three times a day (TID) | ORAL | 0 refills | Status: DC | PRN

## 2024-01-15 MED ORDER — ALFUZOSIN HCL ER 10 MG PO TB24
10.0000 mg | ORAL_TABLET | Freq: Every day | ORAL | Status: DC
  Administered 2024-01-15: 10 mg via ORAL
  Filled 2024-01-15: qty 1

## 2024-01-15 MED ORDER — GLIPIZIDE 5 MG PO TABS
2.5000 mg | ORAL_TABLET | Freq: Every day | ORAL | Status: DC
  Administered 2024-01-15: 2.5 mg via ORAL
  Filled 2024-01-15: qty 1

## 2024-01-15 MED ORDER — ALPRAZOLAM 0.5 MG PO TABS
2.0000 mg | ORAL_TABLET | Freq: Two times a day (BID) | ORAL | Status: DC
  Administered 2024-01-15 (×2): 2 mg via ORAL
  Filled 2024-01-15 (×2): qty 4

## 2024-01-15 MED ORDER — ACCU-CHEK GUIDE ME W/DEVICE KIT: 1.0000 | PACK | Status: DC

## 2024-01-15 MED ORDER — LIDOCAINE-PRILOCAINE 2.5-2.5 % EX CREA: TOPICAL_CREAM | Freq: Once | CUTANEOUS | Status: DC

## 2024-01-15 MED ORDER — ROSUVASTATIN CALCIUM 10 MG PO TABS
10.0000 mg | ORAL_TABLET | Freq: Every day | ORAL | Status: DC
  Administered 2024-01-15: 10 mg via ORAL
  Filled 2024-01-15: qty 1

## 2024-01-15 MED ORDER — MEGESTROL ACETATE 400 MG/10ML PO SUSP
400.0000 mg | Freq: Two times a day (BID) | ORAL | Status: DC
  Administered 2024-01-15: 400 mg via ORAL
  Filled 2024-01-15: qty 10

## 2024-01-15 NOTE — ED Provider Notes (Addendum)
Recent fall, contusion/fx distal sacrum.   TOC consult had been placed re home health.   Cathren Laine, MD 01/15/24 0732  Recheck, Pt/spouse indicate they are ready to go home.    Will place home health face to face order as feel may benefit from home health services given limited mobility, deconditioning, etc.   Pt currently appears stable for d/c.   Return precautions provided.      Cathren Laine, MD 01/15/24 [phone]

## 2024-01-15 NOTE — Evaluation (Signed)
Physical Therapy Evaluation Patient Details Name: Stephen Palmer. MRN: [id number] DOB: 1951/02/11 Today's Date: 01/15/2024  History of Present Illness  Stephen Palmer. is a 73 y.o. male with history of metastatic prostate cancer on chemo presents following multiple falls in the past week.  States he did hit his head during one of the falls.  Did not lose consciousness.  He is not on blood thinners.  He is accompanied by his wife who states he has been behaving at baseline without any vomiting or changes in speech   Denies any preceding chest pain, shortness of breath, dizziness to the falls.  Denies any headache, blurry vision, dizziness following the falls.  Patient states he just feels weak, he has had a decreased appetite.  He does describe some lower abdominal pain in addition to constipation x 8 days.  Denies any urinary symptoms.   Clinical Impression  Patient was agreeable to therapy. Patient needed assistance to go from supine to sit. When observing patient bed mobility patient first rolled to sidelying. But needed assistance to sit up. Once sitting EOB patient did begin to have an increase in pain. Patient transferred from bed to chair with the use of RW. Requiring some cueing when using the RW to keep it close. Patient didn't tolerate being in the chair for to long. So patient transferred back to bed to help alleviate some of the pain. Patient was left in bed at the conclusion of session. Patient will benefit from continued skilled physical therapy in hospital and recommended venue below to increase strength, balance, endurance for safe ADLs and gait.          If plan is discharge home, recommend the following: A little help with walking and/or transfers;A little help with bathing/dressing/bathroom;Assistance with cooking/housework;Help with stairs or ramp for entrance   Can travel by private vehicle        Equipment Recommendations    Recommendations for Other Services        Functional Status Assessment Patient has had a recent decline in their functional status and demonstrates the ability to make significant improvements in function in a reasonable and predictable amount of time.     Precautions / Restrictions Precautions Precautions: Fall Restrictions Weight Bearing Restrictions Per Provider Order: No      Mobility  Bed Mobility Overal bed mobility: Needs Assistance Bed Mobility: Supine to Sit, Sidelying to Sit   Sidelying to sit: Min assist Supine to sit: Min assist          Transfers Overall transfer level: Needs assistance Equipment used: Rolling walker (2 wheels) Transfers: Sit to/from Stand, Bed to chair/wheelchair/BSC Sit to Stand: Contact guard assist, Min assist   Step pivot transfers: Contact guard assist, Min assist            Ambulation/Gait Ambulation/Gait assistance: Contact guard assist, Min assist Gait Distance (Feet): 3 Feet (side steps) Assistive device: Rolling walker (2 wheels) Gait Pattern/deviations: Step-to pattern, Decreased step length - right, Decreased step length - left Gait velocity: slow     General Gait Details: slow labored movement  Stairs            Wheelchair Mobility     Tilt Bed    Modified Rankin (Stroke Patients Only)       Balance Overall balance assessment: Needs assistance Sitting-balance support: Bilateral upper extremity supported, Feet supported Sitting balance-Leahy Scale: Fair Sitting balance - Comments: fair/good   Standing balance support: Bilateral upper extremity supported Standing balance-Leahy  Scale: Fair Standing balance comment: fair/good with RW                             Pertinent Vitals/Pain Pain Assessment Pain Assessment: 0-10 Pain Score: 9  Pain Location: sacrum Pain Descriptors / Indicators: Dull, Aching, Sharp Pain Intervention(s): Limited activity within patient's tolerance, Repositioned, Monitored during session    Home  Living Family/patient expects to be discharged to:: Private residence Living Arrangements: Spouse/significant other Available Help at Discharge: Family;Available 24 hours/day Type of Home: Apartment Home Access: Elevator       Home Layout: One level Home Equipment: None Additional Comments: family may add grab bars in the shower    Prior Function Prior Level of Function : Independent/Modified Independent                     Extremity/Trunk Assessment        Lower Extremity Assessment Lower Extremity Assessment: Generalized weakness       Communication   Communication Communication: No apparent difficulties Cueing Techniques: Verbal cues;Tactile cues  Cognition Arousal: Alert Behavior During Therapy: WFL for tasks assessed/performed Overall Cognitive Status: Within Functional Limits for tasks assessed                                          General Comments      Exercises     Assessment/Plan    PT Assessment Patient needs continued PT services  PT Problem List Decreased strength;Decreased activity tolerance;Decreased balance;Decreased range of motion;Decreased mobility       PT Treatment Interventions DME instruction;Gait training;Stair training;Functional mobility training;Therapeutic activities;Therapeutic exercise;Balance training    PT Goals (Current goals can be found in the Care Plan section)  Acute Rehab PT Goals Patient Stated Goal: To return home PT Goal Formulation: With patient Time For Goal Achievement: 01/22/24 Potential to Achieve Goals: Good    Frequency Min 3X/week     Co-evaluation               AM-PAC PT "6 Clicks" Mobility  Outcome Measure Help needed turning from your back to your side while in a flat bed without using bedrails?: A Lot Help needed moving from lying on your back to sitting on the side of a flat bed without using bedrails?: A Lot Help needed moving to and from a bed to a chair  (including a wheelchair)?: A Little Help needed standing up from a chair using your arms (e.g., wheelchair or bedside chair)?: A Little Help needed to walk in hospital room?: A Little Help needed climbing 3-5 steps with a railing? : A Lot 6 Click Score: 15    End of Session   Activity Tolerance: Patient tolerated treatment well;Patient limited by pain Patient left: in bed;with call bell/phone within reach Nurse Communication: Mobility status PT Visit Diagnosis: Unsteadiness on feet (R26.81);Other abnormalities of gait and mobility (R26.89);Muscle weakness (generalized) (M62.81)    Time: 1610-9604 PT Time Calculation (min) (ACUTE ONLY): 18 min   Charges:   PT Evaluation $PT Eval Low Complexity: 1 Low PT Treatments $Therapeutic Activity: 8-22 mins           Massey Ruhland SPT

## 2024-01-15 NOTE — Progress Notes (Incomplete)
[facility] [address], [place] [address]    Clinic Day:  01/15/2024  Referring physician: Avis Epley, PA*  Patient Care Team: Ladon Applebaum as PCP - General (Family Medicine) Jonelle Sidle, MD as PCP - Cardiology (Cardiology) Franky Macho, MD as Consulting Physician (General Surgery) Doreatha Massed, MD as Medical Oncologist (Medical Oncology) Therese Sarah, RN as Oncology Nurse Navigator (Medical Oncology)   ASSESSMENT & PLAN:   Assessment: 1. 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 - Prostate IMRT from 12/13/2016 through 02/09/2017, 78 Gray in 40 fractions with ADT. - Lupron started back on 09/01/2020 for rising PSA levels. - PSMA PET scan (03/30/2022): Nodes in the retroperitoneum, right pelvis, left lower jugular/supraclavicular stations.  No bone metastasis.  No local recurrence. - Enzalutamide started on 04/03/2022, discontinued due to extreme fatigue, vision changes and jaw pain. - Apalutamide started on 05/01/2022, taking only 2 pills but experiences jaw pain and neck pain and vision changes.  Discontinued on 05/09/2022 due to poor tolerance. - XRT with Dr. Kathrynn Running from 05/30/2022 through 06/30/2022. -Abiraterone 1000 mg and prednisone 5 mg started on 07/06/2022, dose reduced to 750 mg daily, dose further reduced to 500 mg daily on 12/12/2022 due to severe fatigue, discontinued on 11/01/2023 due to progression of disease. - Docetaxel every 3 weeks started on 12/05/2023  2. Social/family history: - Lives at home with his wife.  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 - Current active smoker, 2 packs/day for 55 years.  Rolls his own cigarettes. - Mother had skin cancers.  3.  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 - Definitive SBRT from 10/05/2017 through 10/12/2017,  54 Gray in 3 fractions of 18 Gray.    Plan: Metastatic CRPC to the lymph nodes: - Cycle 1 of dose reduced docetaxel on 12/05/2023. - He denied any GI symptoms.  Labs today: White count 14.5 with elevated neutrophils.  LFTs show mildly elevated AST and ALT. - However he developed coughing spells with expectoration for the past 1 week.  Initially his wife developed an infection which later spread to him.  He also reports sore throat.  Erythema in the back of the throat on exam. - After first cycle, he reported decreasing burning sensation in the upper thighs. - He has not been eating much because of coughing episodes and lost about 5 pounds. - Will hold his chemotherapy today.  Will start him on Megace 400 mg twice daily. - Will do a chest x-ray.  Will treat him with Augmentin and Z-Pak. - RTC 2 weeks for follow-up.   2.  Osteopenia (DEXA scan on 05/17/2022 T score -2.3): - Denosumab to decrease SRE was previously discussed and he denied it. - Continue calcium and vitamin D supplements.    No orders of the defined types were placed in this encounter.     Stephen Palmer,acting as a Neurosurgeon for Doreatha Massed, MD.,have documented all relevant documentation on the behalf of Doreatha Massed, MD,as directed by  Doreatha Massed, MD while in the presence of Doreatha Massed, MD.  ***    Westley R Palmer   1/28/202512:07 PM  CHIEF COMPLAINT:   Diagnosis: castration refractory prostate cancer to the lymph nodes    Cancer Staging  Prostate cancer metastatic to intrapelvic lymph node [facility]) Staging form: Prostate, AJCC 7th Edition - Clinical: Stage IV (  T1c, N1, M1a, PSA: Less than 10, Gleason 8-10) - Unsigned    Prior Therapy:  Prostate IMRT from 12/13/2016 through 02/09/2017, 78 Gray in 40 fractions with ADT  Enzalutamide started on 04/03/2022, discontinued due to extreme fatigue, vision changes and jaw pain Apalutamide started on 05/01/2022, taking only 2 pills but  experiences jaw pain and neck pain and vision changes.  Discontinued on 05/09/2022 due to poor tolerance XRT with Dr. Kathrynn Running from 05/30/2022 through 06/30/2022  Abiraterone 1000 mg and prednisone 5 mg, 07/06/2022 through 11/01/2023  Current Therapy:  Lupron every 6 months- started 09/01/2020    HISTORY OF PRESENT ILLNESS:   Oncology History  Prostate cancer metastatic to intrapelvic lymph node (HCC)  09/29/2016 Initial Diagnosis   Prostate cancer metastatic to intrapelvic lymph node (HCC)    Genetic Testing   Negative genetic testing. No pathogenic variants identified on the Common Hereditary Cancers+RNA panel. The report date is 06/23/2022.  The Common Hereditary Cancers Panel + RNA offered by [facility] includes sequencing and/or deletion duplication testing of the following 47 genes: APC, ATM, AXIN2, BARD1, BMPR1A, BRCA1, BRCA2, BRIP1, CDH1, CDKN2A (p14ARF), CDKN2A (p16INK4a), CKD4, CHEK2, CTNNA1, DICER1, EPCAM (Deletion/duplication testing only), GREM1 (promoter region deletion/duplication testing only), KIT, MEN1, MLH1, MSH2, MSH3, MSH6, MUTYH, NBN, NF1, NHTL1, PALB2, PDGFRA, PMS2, POLD1, POLE, PTEN, RAD50, RAD51C, RAD51D, SDHB, SDHC, SDHD, SMAD4, SMARCA4. STK11, TP53, TSC1, TSC2, and VHL.  The following genes were evaluated for sequence changes only: SDHA and HOXB13 c.251G>A variant only.   12/05/2023 -  Chemotherapy   Patient is on Treatment Plan : PROSTATE Docetaxel (75) + Prednisone q21d        INTERVAL HISTORY:   Stephen Palmer is a 73 y.o. male presenting to clinic today for follow up of castration refractory prostate cancer to the lymph nodes. He was last seen by me on 12/26/23.  Since his last visit, he presented to the ED on 01/14/24 for a fall causing a closed fracture of the sacrum. He was prescribed home health services.   He had a DG chest on 12/26/23 that found: linear scarring/opacity in the left suprahilar region is presumably post inflammatory or post treatment changes, less likely  acute bronchitis.  Today, he states that he is doing well overall. His appetite level is at ***%. His energy level is at ***%.  PAST MEDICAL HISTORY:   Past Medical History: Past Medical History:  Diagnosis Date   Abdominal aortic aneurysm (AAA) (HCC)    Anxiety    COPD (chronic obstructive pulmonary disease) (HCC)    Coronary artery calcification seen on CT scan    Essential hypertension    Lung cancer (HCC)    Non small cell carcinoma - XRT   Non-small cell carcinoma of left lung, stage 1 (HCC) 09/21/2017   Prostate cancer (HCC)    XRT   Stroke (HCC)    Type 2 diabetes mellitus (HCC)     Surgical History: Past Surgical History:  Procedure Laterality Date   COLONOSCOPY N/A 11/23/2015   Procedure: COLONOSCOPY;  Surgeon: Franky Macho, MD;  Location: [facility];  Service: Gastroenterology;  Laterality: N/A;   FRACTURE SURGERY     IR IMAGING GUIDED PORT INSERTION  11/20/2023   PROSTATE BIOPSY     SINUS SURGERY WITH INSTATRAK      Social History: Social History   Socioeconomic History   Marital status: Married    Spouse name: Not on file   Number of children: Not on file   Years of education: Not  on file   Highest education level: Not on file  Occupational History   Not on file  Tobacco Use   Smoking status: Every Day    Current packs/day: 2.00    Average packs/day: 2.0 packs/day for 44.0 years (88.0 ttl pk-yrs)    Types: Cigarettes   Smokeless tobacco: Never  Vaping Use   Vaping status: Never Used  Substance and Sexual Activity   Alcohol use: No    Alcohol/week: 0.0 standard drinks of alcohol   Drug use: No   Sexual activity: Not on file  Other Topics Concern   Not on file  Social History Narrative   Not on file   Social Drivers of Health   Financial Resource Strain: Not on file  Food Insecurity: No Food Insecurity (05/01/2023)   Hunger Vital Sign    Worried About Running Out of Food in the Last Year: Never true    Ran Out of Food in the Last Year:  Never true  Transportation Needs: No Transportation Needs (05/01/2023)   PRAPARE - Administrator, Civil Service (Medical): No    Lack of Transportation (Non-Medical): No  Physical Activity: Not on file  Stress: Not on file  Social Connections: Not on file  Intimate Partner Violence: Not At Risk (05/01/2023)   Humiliation, Afraid, Rape, and Kick questionnaire    Fear of Current or Ex-Partner: No    Emotionally Abused: No    Physically Abused: No    Sexually Abused: No    Family History: Family History  Problem Relation Age of Onset   Heart disease Mother    Skin cancer Mother    Heart disease Father        before age 63   Skin cancer Brother    Breast cancer Maternal Aunt    Cancer Neg Hx     Current Medications:  Current Outpatient Medications:    alfuzosin (UROXATRAL) 10 MG 24 hr tablet, Take 1 tablet (10 mg total) by mouth daily with breakfast., Disp: 30 tablet, Rfl: 11   alprazolam (XANAX) 2 MG tablet, Take 2 mg by mouth 2 (two) times daily., Disp: , Rfl:    amoxicillin-clavulanate (AUGMENTIN) 875-125 MG tablet, Take 1 tablet by mouth 2 (two) times daily. (Patient not taking: Reported on 01/15/2024), Disp: 14 tablet, Rfl: 0   azithromycin (ZITHROMAX Z-PAK) 250 MG tablet, Take 2 tablets by mouth on the first day and take 1 tablet by mouth daily until complete (Patient not taking: Reported on 01/15/2024), Disp: 6 each, Rfl: 0   Blood Glucose Monitoring Suppl (ACCU-CHEK GUIDE ME) w/Device KIT, 1 Piece by Does not apply route as directed., Disp: 1 kit, Rfl: 0   citalopram (CELEXA) 40 MG tablet, Take 20-40 mg by mouth daily., Disp: , Rfl:    glipiZIDE 2.5 MG TABS, Take 2.5 mg by mouth daily before breakfast., Disp: 90 tablet, Rfl: 1   glucose blood (ACCU-CHEK GUIDE) test strip, Use to test glucose 4 times a day., Disp: 200 each, Rfl: 2   ivermectin (STROMECTOL) 3 MG TABS tablet, SMARTSIG:5 Tablet(s) By Mouth Once (Patient not taking: Reported on 01/15/2024), Disp: , Rfl:     lidocaine-prilocaine (EMLA) cream, Apply to affected area once, Disp: 30 g, Rfl: 3   megestrol (MEGACE) 400 MG/10ML suspension, Take 10 mLs (400 mg total) by mouth 2 (two) times daily., Disp: 480 mL, Rfl: 1   multivitamin (ONE-A-DAY MEN'S) TABS tablet, Take 1 tablet by mouth daily., Disp: , Rfl:    predniSONE (  DELTASONE) 5 MG tablet, Take 1 tablet (5 mg total) by mouth daily with breakfast., Disp: 30 tablet, Rfl: 11   prochlorperazine (COMPAZINE) 10 MG tablet, Take 1 tablet (10 mg total) by mouth every 6 (six) hours as needed for nausea or vomiting., Disp: 60 tablet, Rfl: 3   rosuvastatin (CRESTOR) 10 MG tablet, TAKE 1 TABLET BY MOUTH DAILY, Disp: 90 tablet, Rfl: 3   traMADol (ULTRAM) 50 MG tablet, Take 1 tablet (50 mg total) by mouth every 8 (eight) hours as needed., Disp: 15 tablet, Rfl: 0  Current Facility-Administered Medications:    leuprolide (6 Month) (ELIGARD) injection 45 mg, 45 mg, Subcutaneous, Q6 months, , 45 mg at 09/12/23 1447  Facility-Administered Medications Ordered in Other Visits:    0.9 %  sodium chloride infusion, , Intravenous, Continuous, Doreatha Massed, MD, Stopped at 12/05/23 1310   alfuzosin (UROXATRAL) 24 hr tablet 10 mg, 10 mg, Oral, Q breakfast, Halford Decamp, PA-C, 10 mg at 01/15/24 1610   ALPRAZolam Prudy Feeler) tablet 2 mg, 2 mg, Oral, BID, Halford Decamp, PA-C, 2 mg at 01/15/24 9604   citalopram (CELEXA) tablet 40 mg, 40 mg, Oral, Daily, Templeton, James H, PA-C   glipiZIDE (GLUCOTROL) tablet 2.5 mg, 2.5 mg, Oral, QAC breakfast, Halford Decamp, PA-C, 2.5 mg at 01/15/24 5409   lidocaine-prilocaine (EMLA) cream, , Topical, Once, Halford Decamp, PA-C   megestrol (MEGACE) 400 MG/10ML suspension 400 mg, 400 mg, Oral, BID, Halford Decamp, PA-C, 400 mg at 01/15/24 0950   predniSONE (DELTASONE) tablet 5 mg, 5 mg, Oral, Q breakfast, Halford Decamp, PA-C, 5 mg at 01/15/24 8119   prochlorperazine (COMPAZINE) tablet 10 mg, 10 mg, Oral, Q6H PRN,  Halford Decamp, PA-C   rosuvastatin (CRESTOR) tablet 10 mg, 10 mg, Oral, Daily, Halford Decamp, PA-C, 10 mg at 01/15/24 1478   Allergies: Allergies  Allergen Reactions   Sulfa Antibiotics Hives, Itching and Swelling    Tongue swelling   Sulfasalazine Hives, Itching and Swelling    Tongue swelling   Lipitor [Atorvastatin] Other (See Comments)    myalgia   Simvastatin Other (See Comments)    myalgia   Iodinated Contrast Media Nausea And Vomiting    REVIEW OF SYSTEMS:   Review of Systems  Constitutional:  Negative for chills, fatigue and fever.  HENT:   Negative for lump/mass, mouth sores, nosebleeds, sore throat and trouble swallowing.   Eyes:  Negative for eye problems.  Respiratory:  Negative for cough and shortness of breath.   Cardiovascular:  Negative for chest pain, leg swelling and palpitations.  Gastrointestinal:  Negative for abdominal pain, constipation, diarrhea, nausea and vomiting.  Genitourinary:  Negative for bladder incontinence, difficulty urinating, dysuria, frequency, hematuria and nocturia.   Musculoskeletal:  Negative for arthralgias, back pain, flank pain, myalgias and neck pain.  Skin:  Negative for itching and rash.  Neurological:  Negative for dizziness, headaches and numbness.  Hematological:  Does not bruise/bleed easily.  Psychiatric/Behavioral:  Negative for depression, sleep disturbance and suicidal ideas. The patient is not nervous/anxious.   All other systems reviewed and are negative.    VITALS:   There were no vitals taken for this visit.  Wt Readings from Last 3 Encounters:  12/26/23 141 lb 9.6 oz (64.2 kg)  12/05/23 146 lb 3.2 oz (66.3 kg)  11/20/23 150 lb 5.7 oz (68.2 kg)    There is no height or weight on file to calculate BMI.  Performance status (ECOG): 1 - Symptomatic but completely  ambulatory  PHYSICAL EXAM:   Physical Exam Vitals and nursing note reviewed. Exam conducted with a chaperone present.  Constitutional:       Appearance: Normal appearance.  Cardiovascular:     Rate and Rhythm: Normal rate and regular rhythm.     Pulses: Normal pulses.     Heart sounds: Normal heart sounds.  Pulmonary:     Effort: Pulmonary effort is normal.     Breath sounds: Normal breath sounds.  Abdominal:     Palpations: Abdomen is soft. There is no hepatomegaly, splenomegaly or mass.     Tenderness: There is no abdominal tenderness.  Musculoskeletal:     Right lower leg: No edema.     Left lower leg: No edema.  Lymphadenopathy:     Cervical: No cervical adenopathy.     Right cervical: No superficial, deep or posterior cervical adenopathy.    Left cervical: No superficial, deep or posterior cervical adenopathy.     Upper Body:     Right upper body: No supraclavicular or axillary adenopathy.     Left upper body: No supraclavicular or axillary adenopathy.  Neurological:     General: No focal deficit present.     Mental Status: He is alert and oriented to person, place, and time.  Psychiatric:        Mood and Affect: Mood normal.        Behavior: Behavior normal.     LABS:      Latest Ref Rng & Units 01/14/2024    9:09 PM 12/26/2023   11:38 AM 12/05/2023    9:21 AM  CBC  WBC 4.0 - 10.5 K/uL 11.3  14.5  8.6   Hemoglobin 13.0 - 17.0 g/dL 47.8  29.5  62.1   Hematocrit 39.0 - 52.0 % 36.2  35.1  40.7   Platelets 150 - 400 K/uL 377  297  191       Latest Ref Rng & Units 01/14/2024    9:09 PM 12/26/2023   11:38 AM 12/05/2023    9:21 AM  CMP  Glucose 70 - 99 mg/dL 308  657  846   BUN 8 - 23 mg/dL 22  18  22    Creatinine 0.61 - 1.24 mg/dL 9.62  9.52  8.41   Sodium 135 - 145 mmol/L 138  133  139   Potassium 3.5 - 5.1 mmol/L 4.1  3.4  3.7   Chloride 98 - 111 mmol/L 99  95  99   CO2 22 - 32 mmol/L 25  29  29    Calcium 8.9 - 10.3 mg/dL 9.5  8.9  9.3   Total Protein 6.5 - 8.1 g/dL 7.9  6.9  6.8   Total Bilirubin 0.0 - 1.2 mg/dL 0.8  0.4  0.3   Alkaline Phos 38 - 126 U/L 83  69  68   AST 15 - 41 U/L 19  49  27    ALT 0 - 44 U/L 22  77  28      No results found for: "CEA1", "CEA" / No results found for: "CEA1", "CEA" Lab Results  Component Value Date   PSA1 4.0 12/17/2023   No results found for: "LKG401" No results found for: "CAN125"  No results found for: "TOTALPROTELP", "ALBUMINELP", "A1GS", "A2GS", "BETS", "BETA2SER", "GAMS", "MSPIKE", "SPEI" No results found for: "TIBC", "FERRITIN", "IRONPCTSAT" No results found for: "LDH"   STUDIES:   CT CHEST ABDOMEN PELVIS WO CONTRAST Result Date: 01/15/2024 CLINICAL DATA:  History of prostate  carcinoma with generalized weakness, initial encounter EXAM: CT CHEST, ABDOMEN AND PELVIS WITHOUT CONTRAST TECHNIQUE: Multidetector CT imaging of the chest, abdomen and pelvis was performed following the standard protocol without IV contrast. RADIATION DOSE REDUCTION: This exam was performed according to the departmental dose-optimization program which includes automated exposure control, adjustment of the mA and/or kV according to patient size and/or use of iterative reconstruction technique. COMPARISON:  04/12/2020 FINDINGS: CT CHEST FINDINGS Cardiovascular: Atherosclerotic calcifications of the aorta and its branches are seen. Heavy coronary calcifications are noted. No cardiac enlargement is seen. No pericardial effusion is noted. Mediastinum/Nodes: Thoracic inlet is within normal limits. No hilar or mediastinal adenopathy is noted. The esophagus as visualized is within normal limits. Lungs/Pleura: Diffuse emphysematous changes are identified throughout both lungs. Some progressive areas of architectural distortion are noted in the left upper lobe similar to that seen on recent plain film examination this is stable from a prior PET-CT from 10/25/2023. No metabolic activity was noted on that exam consistent with focal extensive scarring. Musculoskeletal: No acute rib abnormality is noted. Chronic changes in the left ribcage similar to that noted on the prior PET-CT are  seen. T12 compression deformity is noted with air within likely related to more chronic compression deformity. Correlation with point tenderness is recommended. CT ABDOMEN PELVIS FINDINGS Hepatobiliary: No focal liver abnormality is seen. No gallstones, gallbladder wall thickening, or biliary dilatation. Pancreas: Unremarkable. No pancreatic ductal dilatation or surrounding inflammatory changes. Spleen: Normal in size without focal abnormality. Adrenals/Urinary Tract: Adrenal glands are within normal limits. Multiple cysts are seen in the kidneys bilaterally. No further follow-up is recommended. No renal calculi or obstructive changes are seen. The bladder is well distended. Stomach/Bowel: Scattered diverticular change of the colon is noted without evidence of diverticulitis. The appendix is within normal limits. Small bowel and stomach are unremarkable. Vascular/Lymphatic: The atherosclerotic calcifications of the abdominal aorta are noted. Dilatation of the aorta to 5.9 cm is noted and stable. No Peri check inflammatory changes to suggest impending rupture are noted. The overall appearance is stable. Iliac atherosclerotic calcifications are seen. No lymphadenopathy is noted. Reproductive: Prostate is within normal limits. Fiducial markers are seen. Other: No abdominal wall hernia or abnormality. No abdominopelvic ascites. Musculoskeletal: No acute or significant osseous findings. IMPRESSION: CT of the chest: Diffuse emphysematous changes with areas of architectural distortion in the left upper lobe stable from prior PET-CT. T12 compression deformity new from the prior PET-CT of uncertain chronicity. Air is noted within the vertebral body suggesting a more chronic process. Correlate to point tenderness. CT of the abdomen and pelvis: Abdominal aortic aneurysm measuring 5.9 cm. Recommend CTA or MRA, as appropriate, in 6 months and referral to a vascular specialist. Reference: Journal of Vascular Surgery 67.1 (2018):  2-77. J Am Coll Radiol [phone]. Diverticulosis without diverticulitis. Electronically Signed   By: Alcide Clever M.D.   On: 01/15/2024 00:03   DG Ribs Unilateral W/Chest Left Result Date: 01/14/2024 CLINICAL DATA:  Left rib pain, recent falls. EXAM: LEFT RIBS AND CHEST - 3+ VIEW COMPARISON:  12/26/2023, PET CT 10/25/2023 FINDINGS: No fracture or other bone lesions are seen involving the ribs. There is no pneumothorax or pleural effusion. Stable scarring in the left upper lobe. Right chest port in place. Heart size and mediastinal contours are within normal limits. IMPRESSION: No evidence of left rib fracture or focal rib abnormality. No pulmonary complication related to fall. Electronically Signed   By: Narda Rutherford M.D.   On: 01/14/2024 23:09  CT Head Wo Contrast Result Date: 01/14/2024 CLINICAL DATA:  Trauma EXAM: CT HEAD WITHOUT CONTRAST TECHNIQUE: Contiguous axial images were obtained from the base of the skull through the vertex without intravenous contrast. RADIATION DOSE REDUCTION: This exam was performed according to the departmental dose-optimization program which includes automated exposure control, adjustment of the mA and/or kV according to patient size and/or use of iterative reconstruction technique. COMPARISON:  MRI brain 10/02/2017. FINDINGS: Brain: No evidence of acute infarction, hemorrhage, hydrocephalus, extra-axial collection or mass lesion/mass effect. There is mild diffuse atrophy. There is mild periventricular white matter hypodensity, likely chronic small vessel ischemic change. 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 Vascular: Atherosclerotic calcifications are present within the cavernous internal carotid arteries. Skull: Normal. Negative for fracture or focal lesion. Sinuses/Orbits: Patient is status post sinonasal surgery bilaterally. There is scattered mucosal thickening throughout ethmoid air cells. There is a small air-fluid level in the left  sphenoid sinus. Mastoid air cells are clear. No acute orbital abnormality. Other: None. IMPRESSION: 1. No acute intracranial process. 2. Mild diffuse atrophy and chronic small vessel ischemic change. 3. Small air-fluid level in the left sphenoid sinus. Correlate for acute sinusitis. Electronically Signed   By: Darliss Cheney M.D.   On: 01/14/2024 22:58   CT Cervical Spine Wo Contrast Result Date: 01/14/2024 CLINICAL DATA:  Trauma EXAM: CT CERVICAL SPINE WITHOUT CONTRAST TECHNIQUE: Multidetector CT imaging of the cervical spine was performed without intravenous contrast. Multiplanar CT image reconstructions were also generated. RADIATION DOSE REDUCTION: This exam was performed according to the departmental dose-optimization program which includes automated exposure control, adjustment of the mA and/or kV according to patient size and/or use of iterative reconstruction technique. COMPARISON:  None Available. FINDINGS: Alignment: Normal. Skull base and vertebrae: There is no acute fracture or dislocation identified. The bones are mildly osteopenic. Soft tissues and spinal canal: No prevertebral fluid or swelling. No visible canal hematoma. Disc levels: There is disc space narrowing and endplate osteophyte formation throughout the cervical spine compatible with degenerative change. This is most significant at C6-C7. There is moderate right-sided neural foraminal stenosis at C4-C5, C5-C6 and C6-C7 secondary to uncovertebral spurring and facet arthropathy. There severe left-sided neural foraminal stenosis at C6-C7 secondary to uncovertebral spurring. No significant central canal stenosis identified. Upper chest: Severe emphysematous changes are present. Other: Right-sided central venous catheter is partially visualized. There are atherosclerotic calcifications of the bilateral carotid arteries. IMPRESSION: 1. No acute fracture or dislocation of the cervical spine. 2. Degenerative changes of the cervical spine. 3. Severe  emphysematous changes of the upper lungs. Emphysema (ICD10-J43.9). Electronically Signed   By: Darliss Cheney M.D.   On: 01/14/2024 22:56   DG Hips Bilat W or Wo Pelvis 3-4 Views Result Date: 01/14/2024 CLINICAL DATA:  Repeated falls with hip pain, initial encounter EXAM: DG HIP (WITH OR WITHOUT PELVIS) 4V BILAT COMPARISON:  None Available. FINDINGS: Pelvic ring is intact. Degenerative changes of the hip joints are noted bilaterally. No acute fracture or dislocation is noted. No soft tissue changes are seen. IMPRESSION: Degenerative change without acute abnormality. Electronically Signed   By: Alcide Clever M.D.   On: 01/14/2024 21:24   DG Sacrum/Coccyx Result Date: 01/14/2024 CLINICAL DATA:  Repetitive falls with sacral pain, initial encounter EXAM: SACRUM AND COCCYX - 2+ VIEW COMPARISON:  None Available. FINDINGS: Pelvic ring is intact. Sacral ala appear intact as well. On the lateral film, there is mild cortical irregularity identified in the distal sacrum. No soft tissue changes  are noted. IMPRESSION: Mildly displaced fracture in the distal sacrum. Electronically Signed   By: Alcide Clever M.D.   On: 01/14/2024 21:24   DG Chest 2 View Result Date: 12/31/2023 CLINICAL DATA:  73 year old male with productive cough and shortness of breath EXAM: CHEST - 2 VIEW COMPARISON:  05/15/2019 PET-CT 10/25/2023 FINDINGS: Cardiomediastinal silhouette unchanged. No interlobular septal thickening. Interval placement of right IJ port catheter. Stigmata of emphysema, with increased retrosternal airspace, flattened hemidiaphragms, increased AP diameter, and hyperinflation on the AP view. Linear opacity is more conspicuous in the left suprahilar region extending to the pleura. Coarsened interstitial markings throughout. No new airspace disease. No pneumothorax or pleural effusion. IMPRESSION: Linear scarring/opacity in the left suprahilar region is presumably post inflammatory or post treatment changes, less likely acute  bronchitis. Emphysema Electronically Signed   By: Gilmer Mor D.O.   On: 12/31/2023 11:06

## 2024-01-15 NOTE — ED Notes (Signed)
ED Provider at bedside.

## 2024-01-15 NOTE — Discharge Instructions (Addendum)
It was our pleasure to provide your ER care today - we hope that you feel better.  Drink plenty of fluids/stay well hydrated. If constipated, may take colace (stool softener) 2x/day, and miralax (laxative) once per day as needed - these meds are available over the counter.   Take acetaminophen as need for pain. You may also take ultram as need for pain.  Fall precautions. Use walker and/or assistance when up, or use wheelchair, to help decrease chance of falling.   We have made a home health referral - they should be contacting you in the next couple days.   Follow up closely with your primary care doctor in the next 1-2 weeks.   Return to ER if worse, new symptoms, fevers, new/severe pain, chest pain, trouble breathing, fainting, or other concern.

## 2024-01-15 NOTE — TOC CM/SW Note (Signed)
Transition of Care [facility]) - Emergency Department Mini Assessment   Patient Details  Name: Stephen Palmer. MRN: [id number] Date of Birth: Feb 06, 1951  [facility] CM/SW Contact:    Beather Arbour Phone Number: 01/15/2024, 11:09 AM   Clinical Narrative: Patient is agreeable to CSW arranging HH. Patient stated that his preferences were [facility], which is his P[facility]. CSW searched to see if pt was established with anyone and he is not CSW will reach out [place] with [facility] for [facility] services. Patient aware. TOC signing ff.    ED Mini Assessment: What brought you to the Emergency Department? : Fall  Barriers to Discharge: Barriers Resolved  Barrier interventions: arrange [facility] services  Means of departure: Car  Interventions which prevented an admission or readmission: Home Health Consult or Services    Patient Contact and Communications Key Contact 1: Marga Hoots   Spoke with: Patient Contact Date: 01/15/24,   Contact time: 1109   Call outcome: Home Health preferences  Patient states their goals for this hospitalization and ongoing recovery are:: DC back home CMS Medicare.gov Compare Post Acute Care list provided to:: Patient Choice offered to / list presented to : Patient  Admission diagnosis:  Fall Patient Active Problem List   Diagnosis Date Noted   Genetic testing 06/26/2022   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 - [place], [place] - [address] [place] [address] Phone: [phone] Fax: [phone]  [facility] - [place], [place] - [address] [place] [address] Phone: [phone] Fax: [phone]  [facility] [address] [place] [address] Phone: [phone] Fax: [phone]

## 2024-01-17 ENCOUNTER — Inpatient Hospital Stay: Payer: Medicare Other

## 2024-01-17 ENCOUNTER — Inpatient Hospital Stay: Payer: Medicare Other | Admitting: Hematology

## 2024-01-18 ENCOUNTER — Inpatient Hospital Stay: Payer: Medicare Other

## 2024-01-19 DIAGNOSIS — M438X4 Other specified deforming dorsopathies, thoracic region: Secondary | ICD-10-CM | POA: Diagnosis not present

## 2024-01-19 DIAGNOSIS — I10 Essential (primary) hypertension: Secondary | ICD-10-CM | POA: Diagnosis not present

## 2024-01-19 DIAGNOSIS — C61 Malignant neoplasm of prostate: Secondary | ICD-10-CM | POA: Diagnosis not present

## 2024-01-19 DIAGNOSIS — M47812 Spondylosis without myelopathy or radiculopathy, cervical region: Secondary | ICD-10-CM | POA: Diagnosis not present

## 2024-01-19 DIAGNOSIS — J439 Emphysema, unspecified: Secondary | ICD-10-CM | POA: Diagnosis not present

## 2024-01-19 DIAGNOSIS — K5790 Diverticulosis of intestine, part unspecified, without perforation or abscess without bleeding: Secondary | ICD-10-CM | POA: Diagnosis not present

## 2024-01-19 DIAGNOSIS — S3219XD Other fracture of sacrum, subsequent encounter for fracture with routine healing: Secondary | ICD-10-CM | POA: Diagnosis not present

## 2024-01-19 DIAGNOSIS — Z7984 Long term (current) use of oral hypoglycemic drugs: Secondary | ICD-10-CM | POA: Diagnosis not present

## 2024-01-19 DIAGNOSIS — M791 Myalgia, unspecified site: Secondary | ICD-10-CM | POA: Diagnosis not present

## 2024-01-19 DIAGNOSIS — Z9181 History of falling: Secondary | ICD-10-CM | POA: Diagnosis not present

## 2024-01-19 DIAGNOSIS — Z87891 Personal history of nicotine dependence: Secondary | ICD-10-CM | POA: Diagnosis not present

## 2024-01-19 DIAGNOSIS — D72829 Elevated white blood cell count, unspecified: Secondary | ICD-10-CM | POA: Diagnosis not present

## 2024-01-19 DIAGNOSIS — I714 Abdominal aortic aneurysm, without rupture, unspecified: Secondary | ICD-10-CM | POA: Diagnosis not present

## 2024-01-19 DIAGNOSIS — E785 Hyperlipidemia, unspecified: Secondary | ICD-10-CM | POA: Diagnosis not present

## 2024-01-19 DIAGNOSIS — M160 Bilateral primary osteoarthritis of hip: Secondary | ICD-10-CM | POA: Diagnosis not present

## 2024-01-19 DIAGNOSIS — E119 Type 2 diabetes mellitus without complications: Secondary | ICD-10-CM | POA: Diagnosis not present

## 2024-01-19 DIAGNOSIS — Z7952 Long term (current) use of systemic steroids: Secondary | ICD-10-CM | POA: Diagnosis not present

## 2024-01-19 DIAGNOSIS — G319 Degenerative disease of nervous system, unspecified: Secondary | ICD-10-CM | POA: Diagnosis not present

## 2024-01-19 DIAGNOSIS — F419 Anxiety disorder, unspecified: Secondary | ICD-10-CM | POA: Diagnosis not present

## 2024-01-21 DIAGNOSIS — D72829 Elevated white blood cell count, unspecified: Secondary | ICD-10-CM | POA: Diagnosis not present

## 2024-01-21 DIAGNOSIS — C61 Malignant neoplasm of prostate: Secondary | ICD-10-CM | POA: Diagnosis not present

## 2024-01-21 DIAGNOSIS — E119 Type 2 diabetes mellitus without complications: Secondary | ICD-10-CM | POA: Diagnosis not present

## 2024-01-21 DIAGNOSIS — I10 Essential (primary) hypertension: Secondary | ICD-10-CM | POA: Diagnosis not present

## 2024-01-21 DIAGNOSIS — S3219XD Other fracture of sacrum, subsequent encounter for fracture with routine healing: Secondary | ICD-10-CM | POA: Diagnosis not present

## 2024-01-21 DIAGNOSIS — I714 Abdominal aortic aneurysm, without rupture, unspecified: Secondary | ICD-10-CM | POA: Diagnosis not present

## 2024-01-22 DIAGNOSIS — M545 Low back pain, unspecified: Secondary | ICD-10-CM | POA: Diagnosis not present

## 2024-01-23 DIAGNOSIS — C61 Malignant neoplasm of prostate: Secondary | ICD-10-CM | POA: Diagnosis not present

## 2024-01-23 DIAGNOSIS — S3219XD Other fracture of sacrum, subsequent encounter for fracture with routine healing: Secondary | ICD-10-CM | POA: Diagnosis not present

## 2024-01-23 DIAGNOSIS — D72829 Elevated white blood cell count, unspecified: Secondary | ICD-10-CM | POA: Diagnosis not present

## 2024-01-23 DIAGNOSIS — I10 Essential (primary) hypertension: Secondary | ICD-10-CM | POA: Diagnosis not present

## 2024-01-23 DIAGNOSIS — I714 Abdominal aortic aneurysm, without rupture, unspecified: Secondary | ICD-10-CM | POA: Diagnosis not present

## 2024-01-23 DIAGNOSIS — E119 Type 2 diabetes mellitus without complications: Secondary | ICD-10-CM | POA: Diagnosis not present

## 2024-01-25 NOTE — Progress Notes (Signed)
 Patient called stating that he is dying, and nothing is helping. Patient very unclear about symptoms at this time, other than uncontrolled pain. RN noted that patient speech is slurred and slow also. Advised patient and wife to call 911 or go to the ED now. They both verbalized understanding.

## 2024-01-28 ENCOUNTER — Telehealth: Payer: Self-pay | Admitting: Licensed Clinical Social Worker

## 2024-01-28 ENCOUNTER — Ambulatory Visit: Payer: Medicare Other | Admitting: Urology

## 2024-01-28 DIAGNOSIS — I10 Essential (primary) hypertension: Secondary | ICD-10-CM | POA: Diagnosis not present

## 2024-01-28 DIAGNOSIS — C61 Malignant neoplasm of prostate: Secondary | ICD-10-CM | POA: Diagnosis not present

## 2024-01-28 DIAGNOSIS — S3219XD Other fracture of sacrum, subsequent encounter for fracture with routine healing: Secondary | ICD-10-CM | POA: Diagnosis not present

## 2024-01-28 DIAGNOSIS — I714 Abdominal aortic aneurysm, without rupture, unspecified: Secondary | ICD-10-CM | POA: Diagnosis not present

## 2024-01-28 NOTE — Telephone Encounter (Signed)
 CSW received a message inquiring about paying for the electric bill which is due today. Voicemail left for pt w/ [employer] contact number to see if [employer] are still available to pay for the bill. Email sent to [facility] regarding the above.

## 2024-01-29 DIAGNOSIS — C61 Malignant neoplasm of prostate: Secondary | ICD-10-CM | POA: Diagnosis not present

## 2024-01-29 DIAGNOSIS — I714 Abdominal aortic aneurysm, without rupture, unspecified: Secondary | ICD-10-CM | POA: Diagnosis not present

## 2024-01-29 DIAGNOSIS — D72829 Elevated white blood cell count, unspecified: Secondary | ICD-10-CM | POA: Diagnosis not present

## 2024-01-29 DIAGNOSIS — I10 Essential (primary) hypertension: Secondary | ICD-10-CM | POA: Diagnosis not present

## 2024-01-29 DIAGNOSIS — S3219XD Other fracture of sacrum, subsequent encounter for fracture with routine healing: Secondary | ICD-10-CM | POA: Diagnosis not present

## 2024-01-29 DIAGNOSIS — E119 Type 2 diabetes mellitus without complications: Secondary | ICD-10-CM | POA: Diagnosis not present

## 2024-02-01 DIAGNOSIS — I10 Essential (primary) hypertension: Secondary | ICD-10-CM | POA: Diagnosis not present

## 2024-02-01 DIAGNOSIS — E119 Type 2 diabetes mellitus without complications: Secondary | ICD-10-CM | POA: Diagnosis not present

## 2024-02-01 DIAGNOSIS — I714 Abdominal aortic aneurysm, without rupture, unspecified: Secondary | ICD-10-CM | POA: Diagnosis not present

## 2024-02-01 DIAGNOSIS — S3219XD Other fracture of sacrum, subsequent encounter for fracture with routine healing: Secondary | ICD-10-CM | POA: Diagnosis not present

## 2024-02-01 DIAGNOSIS — C61 Malignant neoplasm of prostate: Secondary | ICD-10-CM | POA: Diagnosis not present

## 2024-02-01 DIAGNOSIS — D72829 Elevated white blood cell count, unspecified: Secondary | ICD-10-CM | POA: Diagnosis not present

## 2024-02-03 DIAGNOSIS — N281 Cyst of kidney, acquired: Secondary | ICD-10-CM | POA: Diagnosis not present

## 2024-02-03 DIAGNOSIS — Z1152 Encounter for screening for COVID-19: Secondary | ICD-10-CM | POA: Diagnosis not present

## 2024-02-03 DIAGNOSIS — S022XXA Fracture of nasal bones, initial encounter for closed fracture: Secondary | ICD-10-CM | POA: Diagnosis not present

## 2024-02-03 DIAGNOSIS — C61 Malignant neoplasm of prostate: Secondary | ICD-10-CM | POA: Diagnosis not present

## 2024-02-03 DIAGNOSIS — Z91041 Radiographic dye allergy status: Secondary | ICD-10-CM | POA: Diagnosis not present

## 2024-02-03 DIAGNOSIS — E876 Hypokalemia: Secondary | ICD-10-CM | POA: Diagnosis not present

## 2024-02-03 DIAGNOSIS — Z882 Allergy status to sulfonamides status: Secondary | ICD-10-CM | POA: Diagnosis not present

## 2024-02-03 DIAGNOSIS — Z8673 Personal history of transient ischemic attack (TIA), and cerebral infarction without residual deficits: Secondary | ICD-10-CM | POA: Diagnosis not present

## 2024-02-03 DIAGNOSIS — E782 Mixed hyperlipidemia: Secondary | ICD-10-CM | POA: Diagnosis not present

## 2024-02-03 DIAGNOSIS — N400 Enlarged prostate without lower urinary tract symptoms: Secondary | ICD-10-CM | POA: Diagnosis not present

## 2024-02-03 DIAGNOSIS — C779 Secondary and unspecified malignant neoplasm of lymph node, unspecified: Secondary | ICD-10-CM | POA: Diagnosis not present

## 2024-02-03 DIAGNOSIS — W19XXXA Unspecified fall, initial encounter: Secondary | ICD-10-CM | POA: Diagnosis not present

## 2024-02-03 DIAGNOSIS — N401 Enlarged prostate with lower urinary tract symptoms: Secondary | ICD-10-CM | POA: Diagnosis not present

## 2024-02-03 DIAGNOSIS — Z20822 Contact with and (suspected) exposure to covid-19: Secondary | ICD-10-CM | POA: Diagnosis not present

## 2024-02-03 DIAGNOSIS — J439 Emphysema, unspecified: Secondary | ICD-10-CM | POA: Diagnosis not present

## 2024-02-03 DIAGNOSIS — R4182 Altered mental status, unspecified: Secondary | ICD-10-CM | POA: Diagnosis not present

## 2024-02-03 DIAGNOSIS — R54 Age-related physical debility: Secondary | ICD-10-CM | POA: Diagnosis not present

## 2024-02-03 DIAGNOSIS — Z7984 Long term (current) use of oral hypoglycemic drugs: Secondary | ICD-10-CM | POA: Diagnosis not present

## 2024-02-03 DIAGNOSIS — F411 Generalized anxiety disorder: Secondary | ICD-10-CM | POA: Diagnosis not present

## 2024-02-03 DIAGNOSIS — S0993XA Unspecified injury of face, initial encounter: Secondary | ICD-10-CM | POA: Diagnosis not present

## 2024-02-03 DIAGNOSIS — F17210 Nicotine dependence, cigarettes, uncomplicated: Secondary | ICD-10-CM | POA: Diagnosis not present

## 2024-02-03 DIAGNOSIS — R000 Tachycardia, unspecified: Secondary | ICD-10-CM | POA: Diagnosis not present

## 2024-02-03 DIAGNOSIS — E43 Unspecified severe protein-calorie malnutrition: Secondary | ICD-10-CM | POA: Diagnosis not present

## 2024-02-03 DIAGNOSIS — Z681 Body mass index (BMI) 19 or less, adult: Secondary | ICD-10-CM | POA: Diagnosis not present

## 2024-02-03 DIAGNOSIS — R338 Other retention of urine: Secondary | ICD-10-CM | POA: Diagnosis not present

## 2024-02-03 DIAGNOSIS — Z794 Long term (current) use of insulin: Secondary | ICD-10-CM | POA: Diagnosis not present

## 2024-02-03 DIAGNOSIS — E119 Type 2 diabetes mellitus without complications: Secondary | ICD-10-CM | POA: Diagnosis not present

## 2024-02-03 DIAGNOSIS — I10 Essential (primary) hypertension: Secondary | ICD-10-CM | POA: Diagnosis not present

## 2024-02-03 DIAGNOSIS — Z993 Dependence on wheelchair: Secondary | ICD-10-CM | POA: Diagnosis not present

## 2024-02-03 DIAGNOSIS — S0292XA Unspecified fracture of facial bones, initial encounter for closed fracture: Secondary | ICD-10-CM | POA: Diagnosis not present

## 2024-02-03 DIAGNOSIS — I700 Atherosclerosis of aorta: Secondary | ICD-10-CM | POA: Diagnosis not present

## 2024-02-03 DIAGNOSIS — Z79899 Other long term (current) drug therapy: Secondary | ICD-10-CM | POA: Diagnosis not present

## 2024-02-03 DIAGNOSIS — R339 Retention of urine, unspecified: Secondary | ICD-10-CM | POA: Diagnosis not present

## 2024-02-09 DIAGNOSIS — S3219XD Other fracture of sacrum, subsequent encounter for fracture with routine healing: Secondary | ICD-10-CM | POA: Diagnosis not present

## 2024-02-09 DIAGNOSIS — D72829 Elevated white blood cell count, unspecified: Secondary | ICD-10-CM | POA: Diagnosis not present

## 2024-02-09 DIAGNOSIS — E119 Type 2 diabetes mellitus without complications: Secondary | ICD-10-CM | POA: Diagnosis not present

## 2024-02-09 DIAGNOSIS — C61 Malignant neoplasm of prostate: Secondary | ICD-10-CM | POA: Diagnosis not present

## 2024-02-09 DIAGNOSIS — I714 Abdominal aortic aneurysm, without rupture, unspecified: Secondary | ICD-10-CM | POA: Diagnosis not present

## 2024-02-09 DIAGNOSIS — I10 Essential (primary) hypertension: Secondary | ICD-10-CM | POA: Diagnosis not present

## 2024-02-11 DIAGNOSIS — D72829 Elevated white blood cell count, unspecified: Secondary | ICD-10-CM | POA: Diagnosis not present

## 2024-02-11 DIAGNOSIS — I10 Essential (primary) hypertension: Secondary | ICD-10-CM | POA: Diagnosis not present

## 2024-02-11 DIAGNOSIS — S3219XD Other fracture of sacrum, subsequent encounter for fracture with routine healing: Secondary | ICD-10-CM | POA: Diagnosis not present

## 2024-02-11 DIAGNOSIS — E119 Type 2 diabetes mellitus without complications: Secondary | ICD-10-CM | POA: Diagnosis not present

## 2024-02-11 DIAGNOSIS — I714 Abdominal aortic aneurysm, without rupture, unspecified: Secondary | ICD-10-CM | POA: Diagnosis not present

## 2024-02-11 DIAGNOSIS — C61 Malignant neoplasm of prostate: Secondary | ICD-10-CM | POA: Diagnosis not present

## 2024-02-13 DIAGNOSIS — J9601 Acute respiratory failure with hypoxia: Secondary | ICD-10-CM | POA: Diagnosis not present

## 2024-02-13 DIAGNOSIS — Z7952 Long term (current) use of systemic steroids: Secondary | ICD-10-CM | POA: Diagnosis not present

## 2024-02-13 DIAGNOSIS — E46 Unspecified protein-calorie malnutrition: Secondary | ICD-10-CM | POA: Diagnosis not present

## 2024-02-13 DIAGNOSIS — Z9981 Dependence on supplemental oxygen: Secondary | ICD-10-CM | POA: Diagnosis not present

## 2024-02-13 DIAGNOSIS — E43 Unspecified severe protein-calorie malnutrition: Secondary | ICD-10-CM | POA: Diagnosis not present

## 2024-02-13 DIAGNOSIS — S022XXD Fracture of nasal bones, subsequent encounter for fracture with routine healing: Secondary | ICD-10-CM | POA: Diagnosis not present

## 2024-02-13 DIAGNOSIS — G9341 Metabolic encephalopathy: Secondary | ICD-10-CM | POA: Diagnosis not present

## 2024-02-13 DIAGNOSIS — R42 Dizziness and giddiness: Secondary | ICD-10-CM | POA: Diagnosis not present

## 2024-02-13 DIAGNOSIS — R262 Difficulty in walking, not elsewhere classified: Secondary | ICD-10-CM | POA: Diagnosis present

## 2024-02-13 DIAGNOSIS — Z7984 Long term (current) use of oral hypoglycemic drugs: Secondary | ICD-10-CM | POA: Diagnosis not present

## 2024-02-13 DIAGNOSIS — J4489 Other specified chronic obstructive pulmonary disease: Secondary | ICD-10-CM | POA: Diagnosis not present

## 2024-02-13 DIAGNOSIS — J439 Emphysema, unspecified: Secondary | ICD-10-CM | POA: Diagnosis not present

## 2024-02-13 DIAGNOSIS — S51002D Unspecified open wound of left elbow, subsequent encounter: Secondary | ICD-10-CM | POA: Diagnosis not present

## 2024-02-13 DIAGNOSIS — I2510 Atherosclerotic heart disease of native coronary artery without angina pectoris: Secondary | ICD-10-CM | POA: Diagnosis not present

## 2024-02-13 DIAGNOSIS — S61402D Unspecified open wound of left hand, subsequent encounter: Secondary | ICD-10-CM | POA: Diagnosis not present

## 2024-02-13 DIAGNOSIS — R296 Repeated falls: Secondary | ICD-10-CM | POA: Diagnosis not present

## 2024-02-13 DIAGNOSIS — N390 Urinary tract infection, site not specified: Secondary | ICD-10-CM | POA: Diagnosis not present

## 2024-02-13 DIAGNOSIS — D72829 Elevated white blood cell count, unspecified: Secondary | ICD-10-CM | POA: Diagnosis not present

## 2024-02-13 DIAGNOSIS — F17210 Nicotine dependence, cigarettes, uncomplicated: Secondary | ICD-10-CM | POA: Diagnosis not present

## 2024-02-13 DIAGNOSIS — I959 Hypotension, unspecified: Secondary | ICD-10-CM | POA: Diagnosis not present

## 2024-02-13 DIAGNOSIS — R0602 Shortness of breath: Secondary | ICD-10-CM | POA: Diagnosis not present

## 2024-02-13 DIAGNOSIS — D8481 Immunodeficiency due to conditions classified elsewhere: Secondary | ICD-10-CM | POA: Diagnosis present

## 2024-02-13 DIAGNOSIS — I10 Essential (primary) hypertension: Secondary | ICD-10-CM | POA: Diagnosis not present

## 2024-02-13 DIAGNOSIS — Z794 Long term (current) use of insulin: Secondary | ICD-10-CM | POA: Diagnosis not present

## 2024-02-13 DIAGNOSIS — R1319 Other dysphagia: Secondary | ICD-10-CM | POA: Diagnosis present

## 2024-02-13 DIAGNOSIS — Z7901 Long term (current) use of anticoagulants: Secondary | ICD-10-CM | POA: Diagnosis not present

## 2024-02-13 DIAGNOSIS — C61 Malignant neoplasm of prostate: Secondary | ICD-10-CM | POA: Diagnosis present

## 2024-02-13 DIAGNOSIS — E1159 Type 2 diabetes mellitus with other circulatory complications: Secondary | ICD-10-CM | POA: Diagnosis present

## 2024-02-13 DIAGNOSIS — Z792 Long term (current) use of antibiotics: Secondary | ICD-10-CM | POA: Diagnosis not present

## 2024-02-13 DIAGNOSIS — Z79899 Other long term (current) drug therapy: Secondary | ICD-10-CM | POA: Diagnosis not present

## 2024-02-13 DIAGNOSIS — G319 Degenerative disease of nervous system, unspecified: Secondary | ICD-10-CM | POA: Diagnosis not present

## 2024-02-13 DIAGNOSIS — I119 Hypertensive heart disease without heart failure: Secondary | ICD-10-CM | POA: Diagnosis not present

## 2024-02-13 DIAGNOSIS — F339 Major depressive disorder, recurrent, unspecified: Secondary | ICD-10-CM | POA: Diagnosis present

## 2024-02-13 DIAGNOSIS — C3412 Malignant neoplasm of upper lobe, left bronchus or lung: Secondary | ICD-10-CM | POA: Diagnosis present

## 2024-02-13 DIAGNOSIS — I714 Abdominal aortic aneurysm, without rupture, unspecified: Secondary | ICD-10-CM | POA: Diagnosis not present

## 2024-02-13 DIAGNOSIS — I700 Atherosclerosis of aorta: Secondary | ICD-10-CM | POA: Diagnosis not present

## 2024-02-13 DIAGNOSIS — E876 Hypokalemia: Secondary | ICD-10-CM | POA: Diagnosis not present

## 2024-02-13 DIAGNOSIS — N401 Enlarged prostate with lower urinary tract symptoms: Secondary | ICD-10-CM | POA: Diagnosis present

## 2024-02-13 DIAGNOSIS — Z7401 Bed confinement status: Secondary | ICD-10-CM | POA: Diagnosis not present

## 2024-02-13 DIAGNOSIS — R300 Dysuria: Secondary | ICD-10-CM | POA: Diagnosis present

## 2024-02-13 DIAGNOSIS — S3219XD Other fracture of sacrum, subsequent encounter for fracture with routine healing: Secondary | ICD-10-CM | POA: Diagnosis not present

## 2024-02-13 DIAGNOSIS — Z681 Body mass index (BMI) 19 or less, adult: Secondary | ICD-10-CM | POA: Diagnosis not present

## 2024-02-13 DIAGNOSIS — I7143 Infrarenal abdominal aortic aneurysm, without rupture: Secondary | ICD-10-CM | POA: Diagnosis not present

## 2024-02-13 DIAGNOSIS — I6782 Cerebral ischemia: Secondary | ICD-10-CM | POA: Diagnosis not present

## 2024-02-13 DIAGNOSIS — S61502D Unspecified open wound of left wrist, subsequent encounter: Secondary | ICD-10-CM | POA: Diagnosis not present

## 2024-02-13 DIAGNOSIS — F17200 Nicotine dependence, unspecified, uncomplicated: Secondary | ICD-10-CM | POA: Diagnosis not present

## 2024-02-13 DIAGNOSIS — F419 Anxiety disorder, unspecified: Secondary | ICD-10-CM | POA: Diagnosis not present

## 2024-02-13 DIAGNOSIS — R278 Other lack of coordination: Secondary | ICD-10-CM | POA: Diagnosis present

## 2024-02-13 DIAGNOSIS — S51802D Unspecified open wound of left forearm, subsequent encounter: Secondary | ICD-10-CM | POA: Diagnosis not present

## 2024-02-13 DIAGNOSIS — R32 Unspecified urinary incontinence: Secondary | ICD-10-CM | POA: Diagnosis not present

## 2024-02-13 DIAGNOSIS — Z79818 Long term (current) use of other agents affecting estrogen receptors and estrogen levels: Secondary | ICD-10-CM | POA: Diagnosis not present

## 2024-02-13 DIAGNOSIS — F411 Generalized anxiety disorder: Secondary | ICD-10-CM | POA: Diagnosis present

## 2024-02-13 DIAGNOSIS — F32A Depression, unspecified: Secondary | ICD-10-CM | POA: Diagnosis not present

## 2024-02-13 DIAGNOSIS — R54 Age-related physical debility: Secondary | ICD-10-CM | POA: Diagnosis not present

## 2024-02-13 DIAGNOSIS — J99 Respiratory disorders in diseases classified elsewhere: Secondary | ICD-10-CM | POA: Diagnosis present

## 2024-02-13 DIAGNOSIS — W19XXXA Unspecified fall, initial encounter: Secondary | ICD-10-CM | POA: Diagnosis not present

## 2024-02-13 DIAGNOSIS — J101 Influenza due to other identified influenza virus with other respiratory manifestations: Secondary | ICD-10-CM | POA: Diagnosis not present

## 2024-02-13 DIAGNOSIS — R531 Weakness: Secondary | ICD-10-CM | POA: Diagnosis not present

## 2024-02-13 DIAGNOSIS — N138 Other obstructive and reflux uropathy: Secondary | ICD-10-CM | POA: Diagnosis not present

## 2024-02-13 DIAGNOSIS — K219 Gastro-esophageal reflux disease without esophagitis: Secondary | ICD-10-CM | POA: Diagnosis not present

## 2024-02-13 DIAGNOSIS — E119 Type 2 diabetes mellitus without complications: Secondary | ICD-10-CM | POA: Diagnosis present

## 2024-02-13 DIAGNOSIS — E782 Mixed hyperlipidemia: Secondary | ICD-10-CM | POA: Diagnosis not present

## 2024-02-13 DIAGNOSIS — Z91041 Radiographic dye allergy status: Secondary | ICD-10-CM | POA: Diagnosis not present

## 2024-02-13 DIAGNOSIS — Z882 Allergy status to sulfonamides status: Secondary | ICD-10-CM | POA: Diagnosis not present

## 2024-02-13 DIAGNOSIS — D649 Anemia, unspecified: Secondary | ICD-10-CM | POA: Diagnosis not present

## 2024-02-13 DIAGNOSIS — R338 Other retention of urine: Secondary | ICD-10-CM | POA: Diagnosis not present

## 2024-02-13 DIAGNOSIS — M6281 Muscle weakness (generalized): Secondary | ICD-10-CM | POA: Diagnosis present

## 2024-02-18 DIAGNOSIS — S022XXD Fracture of nasal bones, subsequent encounter for fracture with routine healing: Secondary | ICD-10-CM | POA: Diagnosis not present

## 2024-02-18 DIAGNOSIS — E43 Unspecified severe protein-calorie malnutrition: Secondary | ICD-10-CM | POA: Diagnosis not present

## 2024-02-18 DIAGNOSIS — I7143 Infrarenal abdominal aortic aneurysm, without rupture: Secondary | ICD-10-CM | POA: Diagnosis not present

## 2024-02-18 DIAGNOSIS — I2510 Atherosclerotic heart disease of native coronary artery without angina pectoris: Secondary | ICD-10-CM | POA: Diagnosis not present

## 2024-02-18 DIAGNOSIS — G9341 Metabolic encephalopathy: Secondary | ICD-10-CM | POA: Diagnosis not present

## 2024-02-18 DIAGNOSIS — S3219XD Other fracture of sacrum, subsequent encounter for fracture with routine healing: Secondary | ICD-10-CM | POA: Diagnosis not present

## 2024-02-18 DIAGNOSIS — K219 Gastro-esophageal reflux disease without esophagitis: Secondary | ICD-10-CM | POA: Diagnosis not present

## 2024-02-18 DIAGNOSIS — I119 Hypertensive heart disease without heart failure: Secondary | ICD-10-CM | POA: Diagnosis not present

## 2024-02-18 DIAGNOSIS — J4489 Other specified chronic obstructive pulmonary disease: Secondary | ICD-10-CM | POA: Diagnosis not present

## 2024-02-18 DIAGNOSIS — S51002D Unspecified open wound of left elbow, subsequent encounter: Secondary | ICD-10-CM | POA: Diagnosis not present

## 2024-02-18 DIAGNOSIS — F32A Depression, unspecified: Secondary | ICD-10-CM | POA: Diagnosis not present

## 2024-02-18 DIAGNOSIS — I700 Atherosclerosis of aorta: Secondary | ICD-10-CM | POA: Diagnosis not present

## 2024-02-18 DIAGNOSIS — S61502D Unspecified open wound of left wrist, subsequent encounter: Secondary | ICD-10-CM | POA: Diagnosis not present

## 2024-02-18 DIAGNOSIS — E1159 Type 2 diabetes mellitus with other circulatory complications: Secondary | ICD-10-CM | POA: Diagnosis not present

## 2024-02-18 DIAGNOSIS — E782 Mixed hyperlipidemia: Secondary | ICD-10-CM | POA: Diagnosis not present

## 2024-02-18 DIAGNOSIS — N138 Other obstructive and reflux uropathy: Secondary | ICD-10-CM | POA: Diagnosis not present

## 2024-02-18 DIAGNOSIS — R338 Other retention of urine: Secondary | ICD-10-CM | POA: Diagnosis not present

## 2024-02-18 DIAGNOSIS — C61 Malignant neoplasm of prostate: Secondary | ICD-10-CM | POA: Diagnosis not present

## 2024-02-18 DIAGNOSIS — F17200 Nicotine dependence, unspecified, uncomplicated: Secondary | ICD-10-CM | POA: Diagnosis not present

## 2024-02-18 DIAGNOSIS — G319 Degenerative disease of nervous system, unspecified: Secondary | ICD-10-CM | POA: Diagnosis not present

## 2024-02-18 DIAGNOSIS — N401 Enlarged prostate with lower urinary tract symptoms: Secondary | ICD-10-CM | POA: Diagnosis not present

## 2024-02-18 DIAGNOSIS — S61402D Unspecified open wound of left hand, subsequent encounter: Secondary | ICD-10-CM | POA: Diagnosis not present

## 2024-02-18 DIAGNOSIS — J439 Emphysema, unspecified: Secondary | ICD-10-CM | POA: Diagnosis not present

## 2024-02-18 DIAGNOSIS — F411 Generalized anxiety disorder: Secondary | ICD-10-CM | POA: Diagnosis not present

## 2024-02-18 DIAGNOSIS — S51802D Unspecified open wound of left forearm, subsequent encounter: Secondary | ICD-10-CM | POA: Diagnosis not present

## 2024-02-21 DIAGNOSIS — R5381 Other malaise: Secondary | ICD-10-CM | POA: Diagnosis not present

## 2024-02-21 DIAGNOSIS — E1159 Type 2 diabetes mellitus with other circulatory complications: Secondary | ICD-10-CM | POA: Diagnosis not present

## 2024-02-21 DIAGNOSIS — R278 Other lack of coordination: Secondary | ICD-10-CM | POA: Diagnosis not present

## 2024-02-21 DIAGNOSIS — Z7401 Bed confinement status: Secondary | ICD-10-CM | POA: Diagnosis not present

## 2024-02-21 DIAGNOSIS — F419 Anxiety disorder, unspecified: Secondary | ICD-10-CM | POA: Diagnosis not present

## 2024-02-21 DIAGNOSIS — R262 Difficulty in walking, not elsewhere classified: Secondary | ICD-10-CM | POA: Diagnosis not present

## 2024-02-21 DIAGNOSIS — R531 Weakness: Secondary | ICD-10-CM | POA: Diagnosis not present

## 2024-02-21 DIAGNOSIS — F339 Major depressive disorder, recurrent, unspecified: Secondary | ICD-10-CM | POA: Diagnosis not present

## 2024-02-21 DIAGNOSIS — R300 Dysuria: Secondary | ICD-10-CM | POA: Diagnosis not present

## 2024-02-21 DIAGNOSIS — F411 Generalized anxiety disorder: Secondary | ICD-10-CM | POA: Diagnosis not present

## 2024-02-21 DIAGNOSIS — I10 Essential (primary) hypertension: Secondary | ICD-10-CM | POA: Diagnosis not present

## 2024-02-21 DIAGNOSIS — E119 Type 2 diabetes mellitus without complications: Secondary | ICD-10-CM | POA: Diagnosis not present

## 2024-02-21 DIAGNOSIS — N401 Enlarged prostate with lower urinary tract symptoms: Secondary | ICD-10-CM | POA: Diagnosis not present

## 2024-02-21 DIAGNOSIS — Z515 Encounter for palliative care: Secondary | ICD-10-CM | POA: Diagnosis not present

## 2024-02-21 DIAGNOSIS — C61 Malignant neoplasm of prostate: Secondary | ICD-10-CM | POA: Diagnosis not present

## 2024-02-21 DIAGNOSIS — D8481 Immunodeficiency due to conditions classified elsewhere: Secondary | ICD-10-CM | POA: Diagnosis not present

## 2024-02-21 DIAGNOSIS — E43 Unspecified severe protein-calorie malnutrition: Secondary | ICD-10-CM | POA: Diagnosis not present

## 2024-02-21 DIAGNOSIS — J99 Respiratory disorders in diseases classified elsewhere: Secondary | ICD-10-CM | POA: Diagnosis not present

## 2024-02-21 DIAGNOSIS — R296 Repeated falls: Secondary | ICD-10-CM | POA: Diagnosis not present

## 2024-02-21 DIAGNOSIS — R1319 Other dysphagia: Secondary | ICD-10-CM | POA: Diagnosis not present

## 2024-02-21 DIAGNOSIS — J101 Influenza due to other identified influenza virus with other respiratory manifestations: Secondary | ICD-10-CM | POA: Diagnosis not present

## 2024-02-21 DIAGNOSIS — C3412 Malignant neoplasm of upper lobe, left bronchus or lung: Secondary | ICD-10-CM | POA: Diagnosis not present

## 2024-02-21 DIAGNOSIS — M6281 Muscle weakness (generalized): Secondary | ICD-10-CM | POA: Diagnosis not present

## 2024-02-25 DIAGNOSIS — J101 Influenza due to other identified influenza virus with other respiratory manifestations: Secondary | ICD-10-CM | POA: Diagnosis not present

## 2024-02-25 DIAGNOSIS — I10 Essential (primary) hypertension: Secondary | ICD-10-CM | POA: Diagnosis not present

## 2024-02-25 DIAGNOSIS — E119 Type 2 diabetes mellitus without complications: Secondary | ICD-10-CM | POA: Diagnosis not present

## 2024-02-25 DIAGNOSIS — R5381 Other malaise: Secondary | ICD-10-CM | POA: Diagnosis not present

## 2024-02-26 DIAGNOSIS — D8481 Immunodeficiency due to conditions classified elsewhere: Secondary | ICD-10-CM | POA: Diagnosis not present

## 2024-02-26 DIAGNOSIS — R5381 Other malaise: Secondary | ICD-10-CM | POA: Diagnosis not present

## 2024-02-26 DIAGNOSIS — C61 Malignant neoplasm of prostate: Secondary | ICD-10-CM | POA: Diagnosis not present

## 2024-02-26 DIAGNOSIS — E43 Unspecified severe protein-calorie malnutrition: Secondary | ICD-10-CM | POA: Diagnosis not present

## 2024-02-26 DIAGNOSIS — R278 Other lack of coordination: Secondary | ICD-10-CM | POA: Diagnosis not present

## 2024-02-26 DIAGNOSIS — E119 Type 2 diabetes mellitus without complications: Secondary | ICD-10-CM | POA: Diagnosis not present

## 2024-02-26 DIAGNOSIS — R296 Repeated falls: Secondary | ICD-10-CM | POA: Diagnosis not present

## 2024-02-26 DIAGNOSIS — I10 Essential (primary) hypertension: Secondary | ICD-10-CM | POA: Diagnosis not present

## 2024-02-26 DIAGNOSIS — N401 Enlarged prostate with lower urinary tract symptoms: Secondary | ICD-10-CM | POA: Diagnosis not present

## 2024-02-26 DIAGNOSIS — M6281 Muscle weakness (generalized): Secondary | ICD-10-CM | POA: Diagnosis not present

## 2024-02-26 DIAGNOSIS — F411 Generalized anxiety disorder: Secondary | ICD-10-CM | POA: Diagnosis not present

## 2024-02-27 DIAGNOSIS — E43 Unspecified severe protein-calorie malnutrition: Secondary | ICD-10-CM | POA: Diagnosis not present

## 2024-02-27 DIAGNOSIS — F411 Generalized anxiety disorder: Secondary | ICD-10-CM | POA: Diagnosis not present

## 2024-02-27 DIAGNOSIS — Z515 Encounter for palliative care: Secondary | ICD-10-CM | POA: Diagnosis not present

## 2024-02-27 DIAGNOSIS — F339 Major depressive disorder, recurrent, unspecified: Secondary | ICD-10-CM | POA: Diagnosis not present

## 2024-02-27 DIAGNOSIS — N401 Enlarged prostate with lower urinary tract symptoms: Secondary | ICD-10-CM | POA: Diagnosis not present

## 2024-02-27 DIAGNOSIS — F419 Anxiety disorder, unspecified: Secondary | ICD-10-CM | POA: Diagnosis not present

## 2024-02-27 DIAGNOSIS — R296 Repeated falls: Secondary | ICD-10-CM | POA: Diagnosis not present

## 2024-02-27 DIAGNOSIS — C3412 Malignant neoplasm of upper lobe, left bronchus or lung: Secondary | ICD-10-CM | POA: Diagnosis not present

## 2024-02-27 DIAGNOSIS — C61 Malignant neoplasm of prostate: Secondary | ICD-10-CM | POA: Diagnosis not present

## 2024-03-03 ENCOUNTER — Telehealth: Payer: Self-pay

## 2024-03-03 NOTE — Telephone Encounter (Signed)
 Returned call with no answer and no way to leave message.

## 2024-03-04 DIAGNOSIS — J101 Influenza due to other identified influenza virus with other respiratory manifestations: Secondary | ICD-10-CM | POA: Diagnosis not present

## 2024-03-04 DIAGNOSIS — D8481 Immunodeficiency due to conditions classified elsewhere: Secondary | ICD-10-CM | POA: Diagnosis not present

## 2024-03-04 DIAGNOSIS — C61 Malignant neoplasm of prostate: Secondary | ICD-10-CM | POA: Diagnosis not present

## 2024-03-04 DIAGNOSIS — R278 Other lack of coordination: Secondary | ICD-10-CM | POA: Diagnosis not present

## 2024-03-04 DIAGNOSIS — R296 Repeated falls: Secondary | ICD-10-CM | POA: Diagnosis not present

## 2024-03-04 DIAGNOSIS — M6281 Muscle weakness (generalized): Secondary | ICD-10-CM | POA: Diagnosis not present

## 2024-03-04 DIAGNOSIS — N401 Enlarged prostate with lower urinary tract symptoms: Secondary | ICD-10-CM | POA: Diagnosis not present

## 2024-03-04 DIAGNOSIS — I10 Essential (primary) hypertension: Secondary | ICD-10-CM | POA: Diagnosis not present

## 2024-03-04 DIAGNOSIS — E119 Type 2 diabetes mellitus without complications: Secondary | ICD-10-CM | POA: Diagnosis not present

## 2024-03-04 DIAGNOSIS — E43 Unspecified severe protein-calorie malnutrition: Secondary | ICD-10-CM | POA: Diagnosis not present

## 2024-03-04 DIAGNOSIS — F411 Generalized anxiety disorder: Secondary | ICD-10-CM | POA: Diagnosis not present

## 2024-03-04 DIAGNOSIS — R5381 Other malaise: Secondary | ICD-10-CM | POA: Diagnosis not present

## 2024-03-05 DIAGNOSIS — C61 Malignant neoplasm of prostate: Secondary | ICD-10-CM | POA: Diagnosis not present

## 2024-03-05 DIAGNOSIS — S3219XD Other fracture of sacrum, subsequent encounter for fracture with routine healing: Secondary | ICD-10-CM | POA: Diagnosis not present

## 2024-03-05 DIAGNOSIS — S022XXD Fracture of nasal bones, subsequent encounter for fracture with routine healing: Secondary | ICD-10-CM | POA: Diagnosis not present

## 2024-03-05 DIAGNOSIS — E1159 Type 2 diabetes mellitus with other circulatory complications: Secondary | ICD-10-CM | POA: Diagnosis not present

## 2024-03-05 DIAGNOSIS — I7143 Infrarenal abdominal aortic aneurysm, without rupture: Secondary | ICD-10-CM | POA: Diagnosis not present

## 2024-03-05 DIAGNOSIS — I119 Hypertensive heart disease without heart failure: Secondary | ICD-10-CM | POA: Diagnosis not present

## 2024-03-10 DIAGNOSIS — D649 Anemia, unspecified: Secondary | ICD-10-CM | POA: Diagnosis not present

## 2024-03-10 DIAGNOSIS — F3481 Disruptive mood dysregulation disorder: Secondary | ICD-10-CM | POA: Diagnosis not present

## 2024-03-10 DIAGNOSIS — I714 Abdominal aortic aneurysm, without rupture, unspecified: Secondary | ICD-10-CM | POA: Diagnosis not present

## 2024-03-10 DIAGNOSIS — R945 Abnormal results of liver function studies: Secondary | ICD-10-CM | POA: Diagnosis not present

## 2024-03-10 DIAGNOSIS — Z681 Body mass index (BMI) 19 or less, adult: Secondary | ICD-10-CM | POA: Diagnosis not present

## 2024-03-10 DIAGNOSIS — R7309 Other abnormal glucose: Secondary | ICD-10-CM | POA: Diagnosis not present

## 2024-03-10 DIAGNOSIS — C61 Malignant neoplasm of prostate: Secondary | ICD-10-CM | POA: Diagnosis not present

## 2024-03-10 DIAGNOSIS — S3219XD Other fracture of sacrum, subsequent encounter for fracture with routine healing: Secondary | ICD-10-CM | POA: Diagnosis not present

## 2024-03-11 ENCOUNTER — Ambulatory Visit: Payer: Medicare Other | Admitting: "Endocrinology

## 2024-03-11 DIAGNOSIS — C61 Malignant neoplasm of prostate: Secondary | ICD-10-CM | POA: Diagnosis not present

## 2024-03-11 DIAGNOSIS — S022XXD Fracture of nasal bones, subsequent encounter for fracture with routine healing: Secondary | ICD-10-CM | POA: Diagnosis not present

## 2024-03-11 DIAGNOSIS — I119 Hypertensive heart disease without heart failure: Secondary | ICD-10-CM | POA: Diagnosis not present

## 2024-03-11 DIAGNOSIS — E1159 Type 2 diabetes mellitus with other circulatory complications: Secondary | ICD-10-CM | POA: Diagnosis not present

## 2024-03-11 DIAGNOSIS — S3219XD Other fracture of sacrum, subsequent encounter for fracture with routine healing: Secondary | ICD-10-CM | POA: Diagnosis not present

## 2024-03-11 DIAGNOSIS — I7143 Infrarenal abdominal aortic aneurysm, without rupture: Secondary | ICD-10-CM | POA: Diagnosis not present

## 2024-03-14 ENCOUNTER — Other Ambulatory Visit: Payer: Self-pay | Admitting: "Endocrinology

## 2024-03-14 ENCOUNTER — Telehealth: Payer: Self-pay

## 2024-03-14 MED ORDER — GLIPIZIDE 2.5 MG PO TABS: 2.5000 mg | ORAL_TABLET | Freq: Every day | ORAL | 1 refills | Status: DC

## 2024-03-14 NOTE — Telephone Encounter (Signed)
 Pt called stating he has been in rehab after an accident. He was not able to be make appt  on 3/25 but is going to reschedule for 2 months from now. Pt states his A1c was 5.8 on 03/10/24. He is taking Glipizide 2.5mg  daily.

## 2024-03-17 DIAGNOSIS — S3219XD Other fracture of sacrum, subsequent encounter for fracture with routine healing: Secondary | ICD-10-CM | POA: Diagnosis not present

## 2024-03-17 DIAGNOSIS — I7143 Infrarenal abdominal aortic aneurysm, without rupture: Secondary | ICD-10-CM | POA: Diagnosis not present

## 2024-03-17 DIAGNOSIS — I119 Hypertensive heart disease without heart failure: Secondary | ICD-10-CM | POA: Diagnosis not present

## 2024-03-17 DIAGNOSIS — S022XXD Fracture of nasal bones, subsequent encounter for fracture with routine healing: Secondary | ICD-10-CM | POA: Diagnosis not present

## 2024-03-17 DIAGNOSIS — C61 Malignant neoplasm of prostate: Secondary | ICD-10-CM | POA: Diagnosis not present

## 2024-03-17 DIAGNOSIS — E1159 Type 2 diabetes mellitus with other circulatory complications: Secondary | ICD-10-CM | POA: Diagnosis not present

## 2024-03-17 NOTE — Telephone Encounter (Signed)
 Spoke with pt advising him to continue taking Glipizide 2.5mg  po daily per Dr.Nida's orders. Pt voiced understanding.

## 2024-03-18 ENCOUNTER — Other Ambulatory Visit: Payer: Self-pay

## 2024-03-18 DIAGNOSIS — I7143 Infrarenal abdominal aortic aneurysm, without rupture: Secondary | ICD-10-CM | POA: Diagnosis not present

## 2024-03-18 DIAGNOSIS — S022XXD Fracture of nasal bones, subsequent encounter for fracture with routine healing: Secondary | ICD-10-CM | POA: Diagnosis not present

## 2024-03-18 DIAGNOSIS — I119 Hypertensive heart disease without heart failure: Secondary | ICD-10-CM | POA: Diagnosis not present

## 2024-03-18 DIAGNOSIS — S3219XD Other fracture of sacrum, subsequent encounter for fracture with routine healing: Secondary | ICD-10-CM | POA: Diagnosis not present

## 2024-03-18 DIAGNOSIS — E1159 Type 2 diabetes mellitus with other circulatory complications: Secondary | ICD-10-CM | POA: Diagnosis not present

## 2024-03-18 DIAGNOSIS — C61 Malignant neoplasm of prostate: Secondary | ICD-10-CM | POA: Diagnosis not present

## 2024-03-19 DIAGNOSIS — D630 Anemia in neoplastic disease: Secondary | ICD-10-CM | POA: Diagnosis not present

## 2024-03-19 DIAGNOSIS — E43 Unspecified severe protein-calorie malnutrition: Secondary | ICD-10-CM | POA: Diagnosis not present

## 2024-03-19 DIAGNOSIS — E782 Mixed hyperlipidemia: Secondary | ICD-10-CM | POA: Diagnosis not present

## 2024-03-19 DIAGNOSIS — G319 Degenerative disease of nervous system, unspecified: Secondary | ICD-10-CM | POA: Diagnosis not present

## 2024-03-19 DIAGNOSIS — C3490 Malignant neoplasm of unspecified part of unspecified bronchus or lung: Secondary | ICD-10-CM | POA: Diagnosis not present

## 2024-03-19 DIAGNOSIS — I700 Atherosclerosis of aorta: Secondary | ICD-10-CM | POA: Diagnosis not present

## 2024-03-19 DIAGNOSIS — I7143 Infrarenal abdominal aortic aneurysm, without rupture: Secondary | ICD-10-CM | POA: Diagnosis not present

## 2024-03-19 DIAGNOSIS — F17200 Nicotine dependence, unspecified, uncomplicated: Secondary | ICD-10-CM | POA: Diagnosis not present

## 2024-03-19 DIAGNOSIS — K5790 Diverticulosis of intestine, part unspecified, without perforation or abscess without bleeding: Secondary | ICD-10-CM | POA: Diagnosis not present

## 2024-03-19 DIAGNOSIS — J439 Emphysema, unspecified: Secondary | ICD-10-CM | POA: Diagnosis not present

## 2024-03-19 DIAGNOSIS — F32A Depression, unspecified: Secondary | ICD-10-CM | POA: Diagnosis not present

## 2024-03-19 DIAGNOSIS — N138 Other obstructive and reflux uropathy: Secondary | ICD-10-CM | POA: Diagnosis not present

## 2024-03-19 DIAGNOSIS — I2510 Atherosclerotic heart disease of native coronary artery without angina pectoris: Secondary | ICD-10-CM | POA: Diagnosis not present

## 2024-03-19 DIAGNOSIS — R338 Other retention of urine: Secondary | ICD-10-CM | POA: Diagnosis not present

## 2024-03-19 DIAGNOSIS — I119 Hypertensive heart disease without heart failure: Secondary | ICD-10-CM | POA: Diagnosis not present

## 2024-03-19 DIAGNOSIS — N401 Enlarged prostate with lower urinary tract symptoms: Secondary | ICD-10-CM | POA: Diagnosis not present

## 2024-03-19 DIAGNOSIS — M160 Bilateral primary osteoarthritis of hip: Secondary | ICD-10-CM | POA: Diagnosis not present

## 2024-03-19 DIAGNOSIS — F411 Generalized anxiety disorder: Secondary | ICD-10-CM | POA: Diagnosis not present

## 2024-03-19 DIAGNOSIS — S3219XD Other fracture of sacrum, subsequent encounter for fracture with routine healing: Secondary | ICD-10-CM | POA: Diagnosis not present

## 2024-03-19 DIAGNOSIS — E1159 Type 2 diabetes mellitus with other circulatory complications: Secondary | ICD-10-CM | POA: Diagnosis not present

## 2024-03-19 DIAGNOSIS — K219 Gastro-esophageal reflux disease without esophagitis: Secondary | ICD-10-CM | POA: Diagnosis not present

## 2024-03-19 DIAGNOSIS — S022XXD Fracture of nasal bones, subsequent encounter for fracture with routine healing: Secondary | ICD-10-CM | POA: Diagnosis not present

## 2024-03-19 DIAGNOSIS — C61 Malignant neoplasm of prostate: Secondary | ICD-10-CM | POA: Diagnosis not present

## 2024-03-19 DIAGNOSIS — G9341 Metabolic encephalopathy: Secondary | ICD-10-CM | POA: Diagnosis not present

## 2024-03-19 DIAGNOSIS — J4489 Other specified chronic obstructive pulmonary disease: Secondary | ICD-10-CM | POA: Diagnosis not present

## 2024-03-21 DIAGNOSIS — I2510 Atherosclerotic heart disease of native coronary artery without angina pectoris: Secondary | ICD-10-CM | POA: Diagnosis not present

## 2024-03-21 DIAGNOSIS — E1159 Type 2 diabetes mellitus with other circulatory complications: Secondary | ICD-10-CM | POA: Diagnosis not present

## 2024-03-21 DIAGNOSIS — S3219XD Other fracture of sacrum, subsequent encounter for fracture with routine healing: Secondary | ICD-10-CM | POA: Diagnosis not present

## 2024-03-21 DIAGNOSIS — C3490 Malignant neoplasm of unspecified part of unspecified bronchus or lung: Secondary | ICD-10-CM | POA: Diagnosis not present

## 2024-03-21 DIAGNOSIS — S022XXD Fracture of nasal bones, subsequent encounter for fracture with routine healing: Secondary | ICD-10-CM | POA: Diagnosis not present

## 2024-03-21 DIAGNOSIS — C61 Malignant neoplasm of prostate: Secondary | ICD-10-CM | POA: Diagnosis not present

## 2024-03-24 DIAGNOSIS — S3219XD Other fracture of sacrum, subsequent encounter for fracture with routine healing: Secondary | ICD-10-CM | POA: Diagnosis not present

## 2024-03-24 DIAGNOSIS — S022XXD Fracture of nasal bones, subsequent encounter for fracture with routine healing: Secondary | ICD-10-CM | POA: Diagnosis not present

## 2024-03-24 DIAGNOSIS — I2510 Atherosclerotic heart disease of native coronary artery without angina pectoris: Secondary | ICD-10-CM | POA: Diagnosis not present

## 2024-03-24 DIAGNOSIS — E1159 Type 2 diabetes mellitus with other circulatory complications: Secondary | ICD-10-CM | POA: Diagnosis not present

## 2024-03-24 DIAGNOSIS — C3490 Malignant neoplasm of unspecified part of unspecified bronchus or lung: Secondary | ICD-10-CM | POA: Diagnosis not present

## 2024-03-24 DIAGNOSIS — C61 Malignant neoplasm of prostate: Secondary | ICD-10-CM | POA: Diagnosis not present

## 2024-03-25 DIAGNOSIS — S022XXD Fracture of nasal bones, subsequent encounter for fracture with routine healing: Secondary | ICD-10-CM | POA: Diagnosis not present

## 2024-03-25 DIAGNOSIS — E1159 Type 2 diabetes mellitus with other circulatory complications: Secondary | ICD-10-CM | POA: Diagnosis not present

## 2024-03-25 DIAGNOSIS — C61 Malignant neoplasm of prostate: Secondary | ICD-10-CM | POA: Diagnosis not present

## 2024-03-25 DIAGNOSIS — S3219XD Other fracture of sacrum, subsequent encounter for fracture with routine healing: Secondary | ICD-10-CM | POA: Diagnosis not present

## 2024-03-25 DIAGNOSIS — C3490 Malignant neoplasm of unspecified part of unspecified bronchus or lung: Secondary | ICD-10-CM | POA: Diagnosis not present

## 2024-03-25 DIAGNOSIS — I2510 Atherosclerotic heart disease of native coronary artery without angina pectoris: Secondary | ICD-10-CM | POA: Diagnosis not present

## 2024-03-27 DIAGNOSIS — C3490 Malignant neoplasm of unspecified part of unspecified bronchus or lung: Secondary | ICD-10-CM | POA: Diagnosis not present

## 2024-03-27 DIAGNOSIS — S022XXD Fracture of nasal bones, subsequent encounter for fracture with routine healing: Secondary | ICD-10-CM | POA: Diagnosis not present

## 2024-03-27 DIAGNOSIS — C61 Malignant neoplasm of prostate: Secondary | ICD-10-CM | POA: Diagnosis not present

## 2024-03-27 DIAGNOSIS — E1159 Type 2 diabetes mellitus with other circulatory complications: Secondary | ICD-10-CM | POA: Diagnosis not present

## 2024-03-27 DIAGNOSIS — S3219XD Other fracture of sacrum, subsequent encounter for fracture with routine healing: Secondary | ICD-10-CM | POA: Diagnosis not present

## 2024-03-27 DIAGNOSIS — I2510 Atherosclerotic heart disease of native coronary artery without angina pectoris: Secondary | ICD-10-CM | POA: Diagnosis not present

## 2024-04-01 DIAGNOSIS — C3490 Malignant neoplasm of unspecified part of unspecified bronchus or lung: Secondary | ICD-10-CM | POA: Diagnosis not present

## 2024-04-01 DIAGNOSIS — I2510 Atherosclerotic heart disease of native coronary artery without angina pectoris: Secondary | ICD-10-CM | POA: Diagnosis not present

## 2024-04-01 DIAGNOSIS — C61 Malignant neoplasm of prostate: Secondary | ICD-10-CM | POA: Diagnosis not present

## 2024-04-01 DIAGNOSIS — S3219XD Other fracture of sacrum, subsequent encounter for fracture with routine healing: Secondary | ICD-10-CM | POA: Diagnosis not present

## 2024-04-01 DIAGNOSIS — S022XXD Fracture of nasal bones, subsequent encounter for fracture with routine healing: Secondary | ICD-10-CM | POA: Diagnosis not present

## 2024-04-01 DIAGNOSIS — E1159 Type 2 diabetes mellitus with other circulatory complications: Secondary | ICD-10-CM | POA: Diagnosis not present

## 2024-04-03 DIAGNOSIS — S3219XD Other fracture of sacrum, subsequent encounter for fracture with routine healing: Secondary | ICD-10-CM | POA: Diagnosis not present

## 2024-04-03 DIAGNOSIS — C61 Malignant neoplasm of prostate: Secondary | ICD-10-CM | POA: Diagnosis not present

## 2024-04-03 DIAGNOSIS — E1159 Type 2 diabetes mellitus with other circulatory complications: Secondary | ICD-10-CM | POA: Diagnosis not present

## 2024-04-03 DIAGNOSIS — I2510 Atherosclerotic heart disease of native coronary artery without angina pectoris: Secondary | ICD-10-CM | POA: Diagnosis not present

## 2024-04-03 DIAGNOSIS — S022XXD Fracture of nasal bones, subsequent encounter for fracture with routine healing: Secondary | ICD-10-CM | POA: Diagnosis not present

## 2024-04-03 DIAGNOSIS — C3490 Malignant neoplasm of unspecified part of unspecified bronchus or lung: Secondary | ICD-10-CM | POA: Diagnosis not present

## 2024-04-04 DIAGNOSIS — I2510 Atherosclerotic heart disease of native coronary artery without angina pectoris: Secondary | ICD-10-CM | POA: Diagnosis not present

## 2024-04-04 DIAGNOSIS — S022XXD Fracture of nasal bones, subsequent encounter for fracture with routine healing: Secondary | ICD-10-CM | POA: Diagnosis not present

## 2024-04-04 DIAGNOSIS — C3490 Malignant neoplasm of unspecified part of unspecified bronchus or lung: Secondary | ICD-10-CM | POA: Diagnosis not present

## 2024-04-04 DIAGNOSIS — C61 Malignant neoplasm of prostate: Secondary | ICD-10-CM | POA: Diagnosis not present

## 2024-04-04 DIAGNOSIS — E1159 Type 2 diabetes mellitus with other circulatory complications: Secondary | ICD-10-CM | POA: Diagnosis not present

## 2024-04-04 DIAGNOSIS — S3219XD Other fracture of sacrum, subsequent encounter for fracture with routine healing: Secondary | ICD-10-CM | POA: Diagnosis not present

## 2024-04-07 DIAGNOSIS — E1159 Type 2 diabetes mellitus with other circulatory complications: Secondary | ICD-10-CM | POA: Diagnosis not present

## 2024-04-07 DIAGNOSIS — S022XXD Fracture of nasal bones, subsequent encounter for fracture with routine healing: Secondary | ICD-10-CM | POA: Diagnosis not present

## 2024-04-07 DIAGNOSIS — S3219XD Other fracture of sacrum, subsequent encounter for fracture with routine healing: Secondary | ICD-10-CM | POA: Diagnosis not present

## 2024-04-07 DIAGNOSIS — I2510 Atherosclerotic heart disease of native coronary artery without angina pectoris: Secondary | ICD-10-CM | POA: Diagnosis not present

## 2024-04-08 DIAGNOSIS — S3219XD Other fracture of sacrum, subsequent encounter for fracture with routine healing: Secondary | ICD-10-CM | POA: Diagnosis not present

## 2024-04-08 DIAGNOSIS — S022XXD Fracture of nasal bones, subsequent encounter for fracture with routine healing: Secondary | ICD-10-CM | POA: Diagnosis not present

## 2024-04-08 DIAGNOSIS — I2510 Atherosclerotic heart disease of native coronary artery without angina pectoris: Secondary | ICD-10-CM | POA: Diagnosis not present

## 2024-04-08 DIAGNOSIS — E1159 Type 2 diabetes mellitus with other circulatory complications: Secondary | ICD-10-CM | POA: Diagnosis not present

## 2024-04-08 DIAGNOSIS — C3490 Malignant neoplasm of unspecified part of unspecified bronchus or lung: Secondary | ICD-10-CM | POA: Diagnosis not present

## 2024-04-08 DIAGNOSIS — C61 Malignant neoplasm of prostate: Secondary | ICD-10-CM | POA: Diagnosis not present

## 2024-04-09 DIAGNOSIS — C3490 Malignant neoplasm of unspecified part of unspecified bronchus or lung: Secondary | ICD-10-CM | POA: Diagnosis not present

## 2024-04-09 DIAGNOSIS — I2510 Atherosclerotic heart disease of native coronary artery without angina pectoris: Secondary | ICD-10-CM | POA: Diagnosis not present

## 2024-04-09 DIAGNOSIS — E1159 Type 2 diabetes mellitus with other circulatory complications: Secondary | ICD-10-CM | POA: Diagnosis not present

## 2024-04-09 DIAGNOSIS — S3219XD Other fracture of sacrum, subsequent encounter for fracture with routine healing: Secondary | ICD-10-CM | POA: Diagnosis not present

## 2024-04-09 DIAGNOSIS — S022XXD Fracture of nasal bones, subsequent encounter for fracture with routine healing: Secondary | ICD-10-CM | POA: Diagnosis not present

## 2024-04-09 DIAGNOSIS — C61 Malignant neoplasm of prostate: Secondary | ICD-10-CM | POA: Diagnosis not present

## 2024-04-10 ENCOUNTER — Other Ambulatory Visit: Payer: Self-pay

## 2024-04-15 DIAGNOSIS — S022XXD Fracture of nasal bones, subsequent encounter for fracture with routine healing: Secondary | ICD-10-CM | POA: Diagnosis not present

## 2024-04-15 DIAGNOSIS — I2510 Atherosclerotic heart disease of native coronary artery without angina pectoris: Secondary | ICD-10-CM | POA: Diagnosis not present

## 2024-04-15 DIAGNOSIS — S3219XD Other fracture of sacrum, subsequent encounter for fracture with routine healing: Secondary | ICD-10-CM | POA: Diagnosis not present

## 2024-04-15 DIAGNOSIS — C3490 Malignant neoplasm of unspecified part of unspecified bronchus or lung: Secondary | ICD-10-CM | POA: Diagnosis not present

## 2024-04-15 DIAGNOSIS — C61 Malignant neoplasm of prostate: Secondary | ICD-10-CM | POA: Diagnosis not present

## 2024-04-15 DIAGNOSIS — E1159 Type 2 diabetes mellitus with other circulatory complications: Secondary | ICD-10-CM | POA: Diagnosis not present

## 2024-04-18 DIAGNOSIS — C61 Malignant neoplasm of prostate: Secondary | ICD-10-CM | POA: Diagnosis not present

## 2024-04-18 DIAGNOSIS — K219 Gastro-esophageal reflux disease without esophagitis: Secondary | ICD-10-CM | POA: Diagnosis not present

## 2024-04-18 DIAGNOSIS — S022XXD Fracture of nasal bones, subsequent encounter for fracture with routine healing: Secondary | ICD-10-CM | POA: Diagnosis not present

## 2024-04-18 DIAGNOSIS — C3490 Malignant neoplasm of unspecified part of unspecified bronchus or lung: Secondary | ICD-10-CM | POA: Diagnosis not present

## 2024-04-18 DIAGNOSIS — M160 Bilateral primary osteoarthritis of hip: Secondary | ICD-10-CM | POA: Diagnosis not present

## 2024-04-18 DIAGNOSIS — G319 Degenerative disease of nervous system, unspecified: Secondary | ICD-10-CM | POA: Diagnosis not present

## 2024-04-18 DIAGNOSIS — R338 Other retention of urine: Secondary | ICD-10-CM | POA: Diagnosis not present

## 2024-04-18 DIAGNOSIS — I7143 Infrarenal abdominal aortic aneurysm, without rupture: Secondary | ICD-10-CM | POA: Diagnosis not present

## 2024-04-18 DIAGNOSIS — E1159 Type 2 diabetes mellitus with other circulatory complications: Secondary | ICD-10-CM | POA: Diagnosis not present

## 2024-04-18 DIAGNOSIS — D630 Anemia in neoplastic disease: Secondary | ICD-10-CM | POA: Diagnosis not present

## 2024-04-18 DIAGNOSIS — F17200 Nicotine dependence, unspecified, uncomplicated: Secondary | ICD-10-CM | POA: Diagnosis not present

## 2024-04-18 DIAGNOSIS — E782 Mixed hyperlipidemia: Secondary | ICD-10-CM | POA: Diagnosis not present

## 2024-04-18 DIAGNOSIS — F32A Depression, unspecified: Secondary | ICD-10-CM | POA: Diagnosis not present

## 2024-04-18 DIAGNOSIS — G9341 Metabolic encephalopathy: Secondary | ICD-10-CM | POA: Diagnosis not present

## 2024-04-18 DIAGNOSIS — N138 Other obstructive and reflux uropathy: Secondary | ICD-10-CM | POA: Diagnosis not present

## 2024-04-18 DIAGNOSIS — F411 Generalized anxiety disorder: Secondary | ICD-10-CM | POA: Diagnosis not present

## 2024-04-18 DIAGNOSIS — K5790 Diverticulosis of intestine, part unspecified, without perforation or abscess without bleeding: Secondary | ICD-10-CM | POA: Diagnosis not present

## 2024-04-18 DIAGNOSIS — N401 Enlarged prostate with lower urinary tract symptoms: Secondary | ICD-10-CM | POA: Diagnosis not present

## 2024-04-18 DIAGNOSIS — S3219XD Other fracture of sacrum, subsequent encounter for fracture with routine healing: Secondary | ICD-10-CM | POA: Diagnosis not present

## 2024-04-18 DIAGNOSIS — I119 Hypertensive heart disease without heart failure: Secondary | ICD-10-CM | POA: Diagnosis not present

## 2024-04-18 DIAGNOSIS — I2510 Atherosclerotic heart disease of native coronary artery without angina pectoris: Secondary | ICD-10-CM | POA: Diagnosis not present

## 2024-04-18 DIAGNOSIS — J4489 Other specified chronic obstructive pulmonary disease: Secondary | ICD-10-CM | POA: Diagnosis not present

## 2024-04-18 DIAGNOSIS — J439 Emphysema, unspecified: Secondary | ICD-10-CM | POA: Diagnosis not present

## 2024-04-18 DIAGNOSIS — I700 Atherosclerosis of aorta: Secondary | ICD-10-CM | POA: Diagnosis not present

## 2024-04-18 DIAGNOSIS — E43 Unspecified severe protein-calorie malnutrition: Secondary | ICD-10-CM | POA: Diagnosis not present

## 2024-04-21 DIAGNOSIS — I2510 Atherosclerotic heart disease of native coronary artery without angina pectoris: Secondary | ICD-10-CM | POA: Diagnosis not present

## 2024-04-21 DIAGNOSIS — C3490 Malignant neoplasm of unspecified part of unspecified bronchus or lung: Secondary | ICD-10-CM | POA: Diagnosis not present

## 2024-04-21 DIAGNOSIS — S3219XD Other fracture of sacrum, subsequent encounter for fracture with routine healing: Secondary | ICD-10-CM | POA: Diagnosis not present

## 2024-04-21 DIAGNOSIS — E1159 Type 2 diabetes mellitus with other circulatory complications: Secondary | ICD-10-CM | POA: Diagnosis not present

## 2024-04-21 DIAGNOSIS — C61 Malignant neoplasm of prostate: Secondary | ICD-10-CM | POA: Diagnosis not present

## 2024-04-21 DIAGNOSIS — S022XXD Fracture of nasal bones, subsequent encounter for fracture with routine healing: Secondary | ICD-10-CM | POA: Diagnosis not present

## 2024-04-22 DIAGNOSIS — I2510 Atherosclerotic heart disease of native coronary artery without angina pectoris: Secondary | ICD-10-CM | POA: Diagnosis not present

## 2024-04-22 DIAGNOSIS — C3490 Malignant neoplasm of unspecified part of unspecified bronchus or lung: Secondary | ICD-10-CM | POA: Diagnosis not present

## 2024-04-22 DIAGNOSIS — S3219XD Other fracture of sacrum, subsequent encounter for fracture with routine healing: Secondary | ICD-10-CM | POA: Diagnosis not present

## 2024-04-22 DIAGNOSIS — E1159 Type 2 diabetes mellitus with other circulatory complications: Secondary | ICD-10-CM | POA: Diagnosis not present

## 2024-04-22 DIAGNOSIS — C61 Malignant neoplasm of prostate: Secondary | ICD-10-CM | POA: Diagnosis not present

## 2024-04-22 DIAGNOSIS — S022XXD Fracture of nasal bones, subsequent encounter for fracture with routine healing: Secondary | ICD-10-CM | POA: Diagnosis not present

## 2024-04-29 DIAGNOSIS — I2510 Atherosclerotic heart disease of native coronary artery without angina pectoris: Secondary | ICD-10-CM | POA: Diagnosis not present

## 2024-04-29 DIAGNOSIS — S022XXD Fracture of nasal bones, subsequent encounter for fracture with routine healing: Secondary | ICD-10-CM | POA: Diagnosis not present

## 2024-04-29 DIAGNOSIS — C61 Malignant neoplasm of prostate: Secondary | ICD-10-CM | POA: Diagnosis not present

## 2024-04-29 DIAGNOSIS — E1159 Type 2 diabetes mellitus with other circulatory complications: Secondary | ICD-10-CM | POA: Diagnosis not present

## 2024-04-29 DIAGNOSIS — C3490 Malignant neoplasm of unspecified part of unspecified bronchus or lung: Secondary | ICD-10-CM | POA: Diagnosis not present

## 2024-04-29 DIAGNOSIS — S3219XD Other fracture of sacrum, subsequent encounter for fracture with routine healing: Secondary | ICD-10-CM | POA: Diagnosis not present

## 2024-05-05 DIAGNOSIS — S022XXD Fracture of nasal bones, subsequent encounter for fracture with routine healing: Secondary | ICD-10-CM | POA: Diagnosis not present

## 2024-05-05 DIAGNOSIS — C3490 Malignant neoplasm of unspecified part of unspecified bronchus or lung: Secondary | ICD-10-CM | POA: Diagnosis not present

## 2024-05-05 DIAGNOSIS — C61 Malignant neoplasm of prostate: Secondary | ICD-10-CM | POA: Diagnosis not present

## 2024-05-05 DIAGNOSIS — E1159 Type 2 diabetes mellitus with other circulatory complications: Secondary | ICD-10-CM | POA: Diagnosis not present

## 2024-05-05 DIAGNOSIS — S3219XD Other fracture of sacrum, subsequent encounter for fracture with routine healing: Secondary | ICD-10-CM | POA: Diagnosis not present

## 2024-05-05 DIAGNOSIS — I2510 Atherosclerotic heart disease of native coronary artery without angina pectoris: Secondary | ICD-10-CM | POA: Diagnosis not present

## 2024-05-19 DIAGNOSIS — Z515 Encounter for palliative care: Secondary | ICD-10-CM | POA: Diagnosis not present

## 2024-05-19 DIAGNOSIS — R634 Abnormal weight loss: Secondary | ICD-10-CM | POA: Diagnosis not present

## 2024-05-19 DIAGNOSIS — C3412 Malignant neoplasm of upper lobe, left bronchus or lung: Secondary | ICD-10-CM | POA: Diagnosis not present

## 2024-05-19 DIAGNOSIS — R630 Anorexia: Secondary | ICD-10-CM | POA: Diagnosis not present

## 2024-05-19 DIAGNOSIS — C61 Malignant neoplasm of prostate: Secondary | ICD-10-CM | POA: Diagnosis not present

## 2024-05-19 DIAGNOSIS — R64 Cachexia: Secondary | ICD-10-CM | POA: Diagnosis not present

## 2024-05-19 DIAGNOSIS — N401 Enlarged prostate with lower urinary tract symptoms: Secondary | ICD-10-CM | POA: Diagnosis not present

## 2024-05-19 DIAGNOSIS — R42 Dizziness and giddiness: Secondary | ICD-10-CM | POA: Diagnosis not present

## 2024-05-26 ENCOUNTER — Ambulatory Visit: Admitting: "Endocrinology

## 2024-06-09 DIAGNOSIS — I10 Essential (primary) hypertension: Secondary | ICD-10-CM | POA: Diagnosis not present

## 2024-06-09 DIAGNOSIS — J449 Chronic obstructive pulmonary disease, unspecified: Secondary | ICD-10-CM | POA: Diagnosis not present

## 2024-06-09 DIAGNOSIS — I714 Abdominal aortic aneurysm, without rupture, unspecified: Secondary | ICD-10-CM | POA: Diagnosis not present

## 2024-06-09 DIAGNOSIS — F411 Generalized anxiety disorder: Secondary | ICD-10-CM | POA: Diagnosis not present

## 2024-06-09 DIAGNOSIS — C61 Malignant neoplasm of prostate: Secondary | ICD-10-CM | POA: Diagnosis not present

## 2024-06-09 DIAGNOSIS — E440 Moderate protein-calorie malnutrition: Secondary | ICD-10-CM | POA: Diagnosis not present

## 2024-06-09 DIAGNOSIS — R64 Cachexia: Secondary | ICD-10-CM | POA: Diagnosis not present

## 2024-06-09 DIAGNOSIS — I519 Heart disease, unspecified: Secondary | ICD-10-CM | POA: Diagnosis not present

## 2024-06-09 DIAGNOSIS — I639 Cerebral infarction, unspecified: Secondary | ICD-10-CM | POA: Diagnosis not present

## 2024-06-09 DIAGNOSIS — E119 Type 2 diabetes mellitus without complications: Secondary | ICD-10-CM | POA: Diagnosis not present

## 2024-06-09 DIAGNOSIS — C7951 Secondary malignant neoplasm of bone: Secondary | ICD-10-CM | POA: Diagnosis not present

## 2024-06-09 DIAGNOSIS — M1990 Unspecified osteoarthritis, unspecified site: Secondary | ICD-10-CM | POA: Diagnosis not present

## 2024-06-09 DIAGNOSIS — E785 Hyperlipidemia, unspecified: Secondary | ICD-10-CM | POA: Diagnosis not present

## 2024-06-10 DIAGNOSIS — E119 Type 2 diabetes mellitus without complications: Secondary | ICD-10-CM | POA: Diagnosis not present

## 2024-06-10 DIAGNOSIS — I714 Abdominal aortic aneurysm, without rupture, unspecified: Secondary | ICD-10-CM | POA: Diagnosis not present

## 2024-06-10 DIAGNOSIS — C61 Malignant neoplasm of prostate: Secondary | ICD-10-CM | POA: Diagnosis not present

## 2024-06-10 DIAGNOSIS — E440 Moderate protein-calorie malnutrition: Secondary | ICD-10-CM | POA: Diagnosis not present

## 2024-06-10 DIAGNOSIS — C7951 Secondary malignant neoplasm of bone: Secondary | ICD-10-CM | POA: Diagnosis not present

## 2024-06-10 DIAGNOSIS — J449 Chronic obstructive pulmonary disease, unspecified: Secondary | ICD-10-CM | POA: Diagnosis not present

## 2024-06-11 DIAGNOSIS — C7951 Secondary malignant neoplasm of bone: Secondary | ICD-10-CM | POA: Diagnosis not present

## 2024-06-11 DIAGNOSIS — E119 Type 2 diabetes mellitus without complications: Secondary | ICD-10-CM | POA: Diagnosis not present

## 2024-06-11 DIAGNOSIS — E440 Moderate protein-calorie malnutrition: Secondary | ICD-10-CM | POA: Diagnosis not present

## 2024-06-11 DIAGNOSIS — J449 Chronic obstructive pulmonary disease, unspecified: Secondary | ICD-10-CM | POA: Diagnosis not present

## 2024-06-11 DIAGNOSIS — I714 Abdominal aortic aneurysm, without rupture, unspecified: Secondary | ICD-10-CM | POA: Diagnosis not present

## 2024-06-11 DIAGNOSIS — C61 Malignant neoplasm of prostate: Secondary | ICD-10-CM | POA: Diagnosis not present

## 2024-06-12 DIAGNOSIS — I714 Abdominal aortic aneurysm, without rupture, unspecified: Secondary | ICD-10-CM | POA: Diagnosis not present

## 2024-06-12 DIAGNOSIS — C7951 Secondary malignant neoplasm of bone: Secondary | ICD-10-CM | POA: Diagnosis not present

## 2024-06-12 DIAGNOSIS — E119 Type 2 diabetes mellitus without complications: Secondary | ICD-10-CM | POA: Diagnosis not present

## 2024-06-12 DIAGNOSIS — C61 Malignant neoplasm of prostate: Secondary | ICD-10-CM | POA: Diagnosis not present

## 2024-06-12 DIAGNOSIS — E440 Moderate protein-calorie malnutrition: Secondary | ICD-10-CM | POA: Diagnosis not present

## 2024-06-12 DIAGNOSIS — J449 Chronic obstructive pulmonary disease, unspecified: Secondary | ICD-10-CM | POA: Diagnosis not present

## 2024-06-13 DIAGNOSIS — E119 Type 2 diabetes mellitus without complications: Secondary | ICD-10-CM | POA: Diagnosis not present

## 2024-06-13 DIAGNOSIS — J449 Chronic obstructive pulmonary disease, unspecified: Secondary | ICD-10-CM | POA: Diagnosis not present

## 2024-06-13 DIAGNOSIS — E440 Moderate protein-calorie malnutrition: Secondary | ICD-10-CM | POA: Diagnosis not present

## 2024-06-13 DIAGNOSIS — I714 Abdominal aortic aneurysm, without rupture, unspecified: Secondary | ICD-10-CM | POA: Diagnosis not present

## 2024-06-13 DIAGNOSIS — C61 Malignant neoplasm of prostate: Secondary | ICD-10-CM | POA: Diagnosis not present

## 2024-06-13 DIAGNOSIS — C7951 Secondary malignant neoplasm of bone: Secondary | ICD-10-CM | POA: Diagnosis not present

## 2024-06-14 DIAGNOSIS — J449 Chronic obstructive pulmonary disease, unspecified: Secondary | ICD-10-CM | POA: Diagnosis not present

## 2024-06-14 DIAGNOSIS — E119 Type 2 diabetes mellitus without complications: Secondary | ICD-10-CM | POA: Diagnosis not present

## 2024-06-14 DIAGNOSIS — C7951 Secondary malignant neoplasm of bone: Secondary | ICD-10-CM | POA: Diagnosis not present

## 2024-06-14 DIAGNOSIS — E440 Moderate protein-calorie malnutrition: Secondary | ICD-10-CM | POA: Diagnosis not present

## 2024-06-14 DIAGNOSIS — I714 Abdominal aortic aneurysm, without rupture, unspecified: Secondary | ICD-10-CM | POA: Diagnosis not present

## 2024-06-14 DIAGNOSIS — C61 Malignant neoplasm of prostate: Secondary | ICD-10-CM | POA: Diagnosis not present

## 2024-06-17 DEATH — deceased

## 2024-07-11 ENCOUNTER — Ambulatory Visit: Admitting: Urology
